# Patient Record
Sex: Female | Born: 1965
Health system: Southern US, Community
[De-identification: ages and names within clinical notes are randomized; demographics above are authoritative.]

## PROBLEM LIST (undated history)

## (undated) ENCOUNTER — Inpatient Hospital Stay (HOSPITAL_COMMUNITY): Admission: EM | Payer: Self-pay | Source: Home / Self Care

## (undated) DIAGNOSIS — I10 Essential (primary) hypertension: Secondary | ICD-10-CM

## (undated) DIAGNOSIS — IMO0002 Reserved for concepts with insufficient information to code with codable children: Secondary | ICD-10-CM

## (undated) DIAGNOSIS — K56609 Unspecified intestinal obstruction, unspecified as to partial versus complete obstruction: Secondary | ICD-10-CM

## (undated) DIAGNOSIS — T7840XA Allergy, unspecified, initial encounter: Secondary | ICD-10-CM

## (undated) DIAGNOSIS — F329 Major depressive disorder, single episode, unspecified: Secondary | ICD-10-CM

## (undated) DIAGNOSIS — K5090 Crohn's disease, unspecified, without complications: Secondary | ICD-10-CM

## (undated) DIAGNOSIS — F32A Depression, unspecified: Secondary | ICD-10-CM

## (undated) DIAGNOSIS — D649 Anemia, unspecified: Secondary | ICD-10-CM

## (undated) DIAGNOSIS — K219 Gastro-esophageal reflux disease without esophagitis: Secondary | ICD-10-CM

## (undated) DIAGNOSIS — M722 Plantar fascial fibromatosis: Secondary | ICD-10-CM

## (undated) DIAGNOSIS — F419 Anxiety disorder, unspecified: Secondary | ICD-10-CM

## (undated) DIAGNOSIS — K589 Irritable bowel syndrome without diarrhea: Secondary | ICD-10-CM

## (undated) DIAGNOSIS — R0602 Shortness of breath: Secondary | ICD-10-CM

## (undated) HISTORY — DX: Anxiety disorder, unspecified: F41.9

## (undated) HISTORY — PX: UPPER GASTROINTESTINAL ENDOSCOPY: SHX188

## (undated) HISTORY — DX: Major depressive disorder, single episode, unspecified: F32.9

## (undated) HISTORY — DX: Plantar fascial fibromatosis: M72.2

## (undated) HISTORY — PX: COLONOSCOPY: SHX174

## (undated) HISTORY — DX: Allergy, unspecified, initial encounter: T78.40XA

## (undated) HISTORY — PX: ESOPHAGOGASTRODUODENOSCOPY: SHX1529

## (undated) HISTORY — DX: Irritable bowel syndrome, unspecified: K58.9

## (undated) HISTORY — DX: Crohn's disease, unspecified, without complications: K50.90

## (undated) HISTORY — DX: Anemia, unspecified: D64.9

## (undated) HISTORY — DX: Depression, unspecified: F32.A

## (undated) HISTORY — DX: Unspecified intestinal obstruction, unspecified as to partial versus complete obstruction: K56.609

## (undated) HISTORY — PX: WISDOM TOOTH EXTRACTION: SHX21

---
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
## 1999-04-23 ENCOUNTER — Other Ambulatory Visit: Admission: RE | Admit: 1999-04-23 | Discharge: 1999-04-23 | Payer: Self-pay | Admitting: Obstetrics and Gynecology

## 1999-08-01 ENCOUNTER — Inpatient Hospital Stay (HOSPITAL_COMMUNITY): Admission: AD | Admit: 1999-08-01 | Discharge: 1999-08-01 | Payer: Self-pay | Admitting: Obstetrics & Gynecology

## 1999-10-11 ENCOUNTER — Inpatient Hospital Stay (HOSPITAL_COMMUNITY): Admission: EM | Admit: 1999-10-11 | Discharge: 1999-10-14 | Payer: Self-pay | Admitting: Obstetrics and Gynecology

## 2000-04-21 ENCOUNTER — Other Ambulatory Visit: Admission: RE | Admit: 2000-04-21 | Discharge: 2000-04-21 | Payer: Self-pay | Admitting: Obstetrics and Gynecology

## 2002-10-08 ENCOUNTER — Encounter: Admission: RE | Admit: 2002-10-08 | Discharge: 2002-10-08 | Payer: Self-pay | Admitting: Internal Medicine

## 2002-10-08 ENCOUNTER — Encounter: Payer: Self-pay | Admitting: Internal Medicine

## 2004-08-09 ENCOUNTER — Encounter: Admission: RE | Admit: 2004-08-09 | Discharge: 2004-08-09 | Payer: Self-pay | Admitting: Internal Medicine

## 2006-12-14 ENCOUNTER — Ambulatory Visit: Payer: Self-pay | Admitting: Internal Medicine

## 2007-02-20 ENCOUNTER — Ambulatory Visit: Payer: Self-pay | Admitting: Internal Medicine

## 2007-02-22 ENCOUNTER — Ambulatory Visit: Payer: Self-pay | Admitting: *Deleted

## 2007-03-19 ENCOUNTER — Inpatient Hospital Stay (HOSPITAL_COMMUNITY): Admission: AD | Admit: 2007-03-19 | Discharge: 2007-03-19 | Payer: Self-pay | Admitting: Family Medicine

## 2007-03-19 LAB — ABO/RH

## 2007-03-28 DIAGNOSIS — I10 Essential (primary) hypertension: Secondary | ICD-10-CM | POA: Insufficient documentation

## 2007-03-28 DIAGNOSIS — K219 Gastro-esophageal reflux disease without esophagitis: Secondary | ICD-10-CM

## 2007-04-21 ENCOUNTER — Inpatient Hospital Stay (HOSPITAL_COMMUNITY): Admission: AD | Admit: 2007-04-21 | Discharge: 2007-04-21 | Payer: Self-pay | Admitting: Gynecology

## 2007-06-18 DIAGNOSIS — IMO0002 Reserved for concepts with insufficient information to code with codable children: Secondary | ICD-10-CM

## 2007-06-18 HISTORY — DX: Reserved for concepts with insufficient information to code with codable children: IMO0002

## 2007-06-26 ENCOUNTER — Inpatient Hospital Stay (HOSPITAL_COMMUNITY): Admission: AD | Admit: 2007-06-26 | Discharge: 2007-07-03 | Payer: Self-pay | Admitting: Obstetrics & Gynecology

## 2007-06-27 ENCOUNTER — Encounter: Payer: Self-pay | Admitting: Obstetrics & Gynecology

## 2007-06-28 ENCOUNTER — Encounter: Payer: Self-pay | Admitting: Obstetrics & Gynecology

## 2007-06-29 ENCOUNTER — Encounter: Payer: Self-pay | Admitting: Obstetrics & Gynecology

## 2007-06-29 ENCOUNTER — Encounter (INDEPENDENT_AMBULATORY_CARE_PROVIDER_SITE_OTHER): Payer: Self-pay | Admitting: Obstetrics & Gynecology

## 2007-09-28 ENCOUNTER — Ambulatory Visit: Payer: Self-pay | Admitting: Internal Medicine

## 2007-10-08 ENCOUNTER — Encounter (INDEPENDENT_AMBULATORY_CARE_PROVIDER_SITE_OTHER): Payer: Self-pay | Admitting: *Deleted

## 2007-10-08 LAB — CONVERTED CEMR LAB
BUN: 8 mg/dL (ref 6–23)
Creatinine, Ser: 0.78 mg/dL (ref 0.40–1.20)
Potassium: 3.6 meq/L (ref 3.5–5.3)

## 2008-07-25 ENCOUNTER — Ambulatory Visit: Payer: Self-pay | Admitting: Internal Medicine

## 2008-08-08 ENCOUNTER — Ambulatory Visit: Payer: Self-pay | Admitting: Internal Medicine

## 2008-08-29 ENCOUNTER — Ambulatory Visit: Payer: Self-pay | Admitting: Internal Medicine

## 2008-09-24 ENCOUNTER — Telehealth (INDEPENDENT_AMBULATORY_CARE_PROVIDER_SITE_OTHER): Payer: Self-pay | Admitting: *Deleted

## 2008-09-30 ENCOUNTER — Telehealth (INDEPENDENT_AMBULATORY_CARE_PROVIDER_SITE_OTHER): Payer: Self-pay | Admitting: Internal Medicine

## 2009-08-24 ENCOUNTER — Telehealth (INDEPENDENT_AMBULATORY_CARE_PROVIDER_SITE_OTHER): Payer: Self-pay | Admitting: Internal Medicine

## 2009-09-08 ENCOUNTER — Encounter (INDEPENDENT_AMBULATORY_CARE_PROVIDER_SITE_OTHER): Payer: Self-pay | Admitting: *Deleted

## 2009-11-04 ENCOUNTER — Telehealth: Payer: Self-pay | Admitting: Physician Assistant

## 2009-11-11 ENCOUNTER — Telehealth (INDEPENDENT_AMBULATORY_CARE_PROVIDER_SITE_OTHER): Payer: Self-pay | Admitting: Internal Medicine

## 2010-03-11 ENCOUNTER — Encounter: Admission: RE | Admit: 2010-03-11 | Discharge: 2010-03-11 | Payer: Self-pay | Admitting: Internal Medicine

## 2010-05-25 ENCOUNTER — Ambulatory Visit: Payer: Self-pay | Admitting: Internal Medicine

## 2010-05-25 DIAGNOSIS — H9319 Tinnitus, unspecified ear: Secondary | ICD-10-CM | POA: Insufficient documentation

## 2010-05-25 DIAGNOSIS — M722 Plantar fascial fibromatosis: Secondary | ICD-10-CM

## 2010-05-25 DIAGNOSIS — N949 Unspecified condition associated with female genital organs and menstrual cycle: Secondary | ICD-10-CM

## 2010-05-25 LAB — CONVERTED CEMR LAB
Beta hcg, urine, semiquantitative: NEGATIVE
Bilirubin Urine: NEGATIVE
Blood in Urine, dipstick: NEGATIVE
Glucose, Urine, Semiquant: NEGATIVE
Specific Gravity, Urine: 1.015
pH: 7

## 2010-05-31 DIAGNOSIS — E876 Hypokalemia: Secondary | ICD-10-CM

## 2010-05-31 LAB — CONVERTED CEMR LAB
BUN: 9 mg/dL (ref 6–23)
CO2: 24 meq/L (ref 19–32)
Calcium: 8.7 mg/dL (ref 8.4–10.5)
Chloride: 106 meq/L (ref 96–112)
Creatinine, Ser: 0.64 mg/dL (ref 0.40–1.20)
Potassium: 3.2 meq/L — ABNORMAL LOW (ref 3.5–5.3)

## 2010-06-08 ENCOUNTER — Encounter (INDEPENDENT_AMBULATORY_CARE_PROVIDER_SITE_OTHER): Payer: Self-pay | Admitting: Internal Medicine

## 2010-06-16 ENCOUNTER — Encounter
Admission: RE | Admit: 2010-06-16 | Discharge: 2010-07-17 | Payer: Self-pay | Source: Home / Self Care | Attending: Internal Medicine | Admitting: Internal Medicine

## 2010-06-23 ENCOUNTER — Encounter (INDEPENDENT_AMBULATORY_CARE_PROVIDER_SITE_OTHER): Payer: Self-pay | Admitting: Internal Medicine

## 2010-07-13 ENCOUNTER — Encounter (INDEPENDENT_AMBULATORY_CARE_PROVIDER_SITE_OTHER): Payer: Self-pay | Admitting: Internal Medicine

## 2010-07-21 ENCOUNTER — Encounter
Admission: RE | Admit: 2010-07-21 | Discharge: 2010-08-08 | Payer: Self-pay | Source: Home / Self Care | Attending: Internal Medicine | Admitting: Internal Medicine

## 2010-07-21 ENCOUNTER — Encounter (INDEPENDENT_AMBULATORY_CARE_PROVIDER_SITE_OTHER): Payer: Self-pay | Admitting: Internal Medicine

## 2010-08-09 ENCOUNTER — Encounter: Payer: Self-pay | Admitting: Obstetrics & Gynecology

## 2010-08-17 NOTE — Assessment & Plan Note (Signed)
Summary: NEED GET HER MEDS//[facility]   Vital Signs:  Patient profile:   45 year old female Menstrual status:  irregular LMP:     04/27/2010 Weight:      180.5 pounds Temp:     97 degrees F oral Pulse rate:   78 / minute Pulse rhythm:   regular Resp:     20 per minute BP sitting:   130 / 84  (left arm) Cuff size:   regular  Vitals Entered By: Bridgett Larsson RN CC: Needs all meds refilled. Has seen Dr. Jeanie Cooks who added HCTZ 12.41m. Has been dx with plantar fascitis takes Naproxen however painis worse, ear problems Is Patient Diabetic? No Pain Assessment Patient in pain? yes     Location: abdomen Intensity: 5 Type: dull Onset of pain  Intermittent  Does patient need assistance? Functional Status Self care Ambulation Normal Comments Concerned she might be pregnant, Abd pains, denies tenderness in breast, has had several episodes of nausea. LMP (date): 04/27/2010     Menstrual Status irregular Enter LMP: 04/27/2010   CC:  Needs all meds refilled. Has seen Dr. AJeanie Cookswho added HCTZ 12.529m Has been dx with plantar fascitis takes Naproxen however painis worse and ear problems.  History of Present Illness: 1.  Hypertension:  has been out of HCTZ, [facility], for 6 days and the Procardia since the 21st of October.  Needs to get started back on both.  [facility]--reportedly was okay.    2.  Would like to have pregnancy test.  A few days late.  Having some discomfort in lower right back.  No real symptoms that period is going to start.  3.  Ear concerns:  few weeks ago had an ear ache.  Feels eardrum almost "pounding"  like the eardrum is going back and forth--like being hit like a drum.  Has this daily.  Notes usually when lying down.  Can have in either ear.  Faster than a heartbeat.  Hearing is unaffected.  No dizziness.  Rarely has ringing in ears.  4.  Diagnosed with left plantar fasciitis and was  told to take Naproxen.  Pain is now going up back of calf.  Did not get exercises.  Is wearing cushioned inserts for shoes.    Allergies (verified): No Known Drug Allergies  Physical Exam  General:  NAD Ears:  External ear exam shows no significant lesions or deformities.  Otoscopic examination reveals clear canals, tympanic membranes are intact bilaterally without bulging, retraction, inflammation or discharge. Hearing is grossly normal bilaterally. Nose:  External nasal examination shows no deformity or inflammation. Nasal mucosa are pink and moist without lesions or exudates. Mouth:  pharynx pink and moist.   Neck:  No deformities, masses, or tenderness noted. Lungs:  Normal respiratory effort, chest expands symmetrically. Lungs are clear to auscultation, no crackles or wheezes. Heart:  Normal rate and regular rhythm. S1 and S2 normal without gallop, murmur, click, rub or other extra sounds.  Radial pulses normal and equal Abdomen:  Bowel sounds positive,abdomen soft and non-tender without masses, organomegaly or hernias noted.  No right flank tenderness. Extremities:  Tender over plantar aspect of left heel.  NT over achilles tendon.  No erythema or swelling.   Impression & Recommendations:  Problem # 1:  TINNITUS (ICD-388.30) Not true tinnitus--sounds more like may be having twitching of tensor tympani muscle--pt describes more of a fluttering muscle rhythm.  Having occasional muscle twitches elsewhere To follow and get good rest, stay hydrated and avoid loud noises.  Problem # 2:  PLANTAR FASCIITIS, LEFT (ICD-728.71) Went over home exercises NSAID use as well. Heel cups bilaterally Orders: Physical Therapy Referral (PT)  Problem # 3:  DELAYED MENSES (ICD-626.8) Suspect not pregnant with history of infertility Orders: Urine Pregnancy Test  (28786)  Problem # 4:  HYPERTENSION (ICD-401.9) Restart meds. Her updated medication list for this problem includes:    Procardia Xl  60 Mg Tb24 (Nifedipine) .Marland Kitchen... 1 tab by mouth daily    Hydrochlorothiazide 12.5 Mg Caps (Hydrochlorothiazide) .Marland Kitchen... 1 tab by mouth daily  Orders: T-Basic Metabolic Panel (76720-94709) UA Dipstick w/o Micro (manual) (81002)  Problem # 5:  Preventive Health Care (ICD-V70.0) Flu vaccine  Problem # 6:  ACID REFLUX DISEASE (ICD-530.81) Pt. at discharge relates she needs PPI refilled as has been out as well and having heartburn. Her updated medication list for this problem includes:    Omeprazole 20 Mg Tbec (Omeprazole) .Marland Kitchen... 1 tab by mouth daily  Complete Medication List: 1)  Procardia Xl 60 Mg Tb24 (Nifedipine) .Marland Kitchen.. 1 tab by mouth daily 2)  Prenatal Vitamins 0.8 Mg Tabs (Prenatal multivit-min-fe-fa) .Marland Kitchen.. 1 tab by mouth daily 3)  Omeprazole 20 Mg Tbec (Omeprazole) .Marland Kitchen.. 1 tab by mouth daily 4)  Hydrochlorothiazide 12.5 Mg Caps (Hydrochlorothiazide) .Marland Kitchen.. 1 tab by mouth daily  Other Orders: Flu Vaccine 67yr + ((62836 Admin 1st Vaccine ((62947  Patient Instructions: 1)  [facility])  Follow up with Dr. MAmil Amenin 4 months --htn, plantar fasciitis Prescriptions: OMEPRAZOLE 20 MG TBEC (OMEPRAZOLE) 1 tab by mouth daily  #30 x 11   Entered and Authorized by:   EMack HookMD   Signed by:   EMack HookMD on 05/25/2010   Method used:   Faxed to ...       [facility])       [address] [place]  [address]      Ph: [phone]      Fax: [phone]  RxID:   [id number] XL 60 MG  TB24 (NIFEDIPINE) 1 tab by mouth daily  #30 x 11   Entered and Authorized by:   EMack HookMD   Signed by:   EMack HookMD on 05/25/2010   Method used:   Faxed to ...       [facility])       [address]  [address]      Ph: [phone]      Fax: [phone]  RxID:    [id number] 12.5 MG CAPS (HYDROCHLOROTHIAZIDE) 1 tab by mouth daily  #30 x 11   Entered and Authorized by:   EMack HookMD   Signed by:   EMack HookMD on 05/25/2010   Method used:   Faxed to ...       [facility]       [address]   [address]      Ph: [phone]      Fax: [phone]  RxID:   [id number]   Orders Added: 1)  T-Basic Metabolic Panel [[[id number]]2)  Urine Pregnancy Test  [81025] 3)  UA Dipstick w/o Micro (manual) [81002] 4)  Est. Patient Level IV [56812] 5)  Flu Vaccine 51yr + [90658] 6)  Admin 1st Vaccine [90471] 7)  Physical Therapy Referral [PT]   Immunizations Administered:  Influenza Vaccine # 1:    Vaccine Type: Fluvax 3+    Site: left deltoid    Mfr: [place]   Dose: 0.5 ml    Route: IM    Given by: MBridgett LarssonRN    Exp. Date: 01/15/2011    Lot #: [id number]   VIS given: 02/09/10 version given May 25, 2010.  Flu Vaccine Consent Questions:    Do you have a history of severe allergic reactions to this vaccine? no    Any prior history of allergic reactions to egg and/or gelatin? no    Do you have a sensitivity to the preservative Thimersol? no    Do you have a past history of Guillan-Barre Syndrome? no    Do you currently have an acute febrile illness? no    Have you ever had a severe reaction to latex? no    Vaccine information given and explained to patient? yes    Are you currently pregnant? yes   Immunizations Administered:  Influenza Vaccine # 1:    Vaccine Type: Fluvax 3+    Site: left deltoid    Mfr: [place]   Dose: 0.5 ml    Route: IM    Given by: MBridgett LarssonRN    Exp. Date: 01/15/2011    Lot #: [id number]   VIS given: 02/09/10 version given May 25, 2010.    Laboratory Results   Urine Tests  Date/Time Received: May 25, 2010 6:02 PM   Routine Urinalysis    Color: lt. yellow Glucose: negative   (Normal Range: Negative) Bilirubin: negative   (Normal Range: Negative) Ketone: negative   (Normal Range: Negative) Spec. Gravity: 1.015   (Normal Range: 1.003-1.035) Blood: negative   (Normal Range: Negative) pH: 7.0   (Normal Range: 5.0-8.0) Protein: negative   (Normal Range: Negative) Urobilinogen: 1.0   (Normal Range: 0-1) Nitrite: negative   (Normal Range: Negative) Leukocyte Esterace: negative   (Normal Range: Negative)    Urine HCG: negative

## 2010-08-17 NOTE — Letter (Signed)
Summary: *HSN Results Follow up  [place], [place] [address]   Phone: [phone]  Fax: [phone]      09/08/2009   CHRISTY EHRSAM [address] [place], [place]  [address]   Dear  Ms. Silverio Decamp,                            ____S.Drinkard,FNP   ____D. Gore,FNP       ____B. McPherson,MD   ____V. Rankins,MD    __xx__E. Mulberry,MD    ____N. Hassell Done, FNP  ____D. Jobe Igo, MD    ____K. Tomma Lightning, MD    ____Other     This letter is to inform you that your recent test(s):  _______Pap Smear    _______Lab Test     _______X-ray __x__Prescription request    _______ is within acceptable limits  _______ requires a medication change  _______ requires a follow-up lab visit  ____xx___ requires a follow-up visit with your provider  Comments:  Please call to schedule an appointment with Dr. Amil Amen.  Thank you.      _________________________________________________________ If you have any questions, please contact our office                     Sincerely,  Shellia Carwin CMA [facility]

## 2010-08-17 NOTE — Progress Notes (Signed)
Summary: DIDN'T GET REFILL ON OMEPRAZOLE  Phone Note Call from Patient Call back at Home Phone [phone]   Reason for Call: Refill Medication Summary of Call: Marissa Blankenship PT. MS Cajuste SAYS THAT SHE CALLED LAST WEEK AND SAYS THAT OTHER THAN HER PROCARDIA BEING REFILLED, SHE ALSO NEEDED HER OMEPRAZOLE CALLED IN AND SHE WAS TOLD THAT IT WOULD BE FAXED, BUT SHE CHECKED ON IT AND THE PHARMACY DOESN'T HAVE IT. Initial call taken by: Roberto Scales,  November 11, 2009 10:22 AM  Follow-up for Phone Call        Will fwd to provider for refill auth.  Pt has medicaid and will be seeing her new doctor on May 15th and would like one refill of omeprezole called to [place] until she can get in with her new doctor. Follow-up by: Shellia Carwin CMA,  November 11, 2009 11:30 AM  Additional Follow-up for Phone Call Additional follow up Details #1::        [facility] Additional Follow-up by: Aurora Mask FNP,  November 12, 2009 7:09 AM    Additional Follow-up for Phone Call Additional follow up Details #2::    Called [phone], Left message on answering machine for pt to return call .......Marland Kitchen Oswaldo Conroy SMA   November 12, 2009 9:43 AM   Prescriptions: OMEPRAZOLE 20 MG TBEC (OMEPRAZOLE) 1 tab by mouth daily  #30 x 1   Entered and Authorized by:   Aurora Mask FNP   Signed by:   Aurora Mask FNP on 11/12/2009   Method used:   [facility] [phone]* (retail)       [address]       [place], [place]  [address]       Ph: [phone] or [phone]       Fax: [phone]   RxID:   [id number]

## 2010-08-17 NOTE — Progress Notes (Signed)
Summary: med refill  Phone Note Call from Patient Call back at Home Phone [phone] Call back at [phone]   Summary of Call: Ms. Toppin had probably few months with a medicaid card uinder another medical office but the new Dr Gabriel Carina has somehting available until May 15.  Pt used to see Dr Amil Amen in the past and she is wondered if the provder can refills (neproxel and lisinopril medications) [facility]   Initial call taken by: Alexis Goodell,  November 04, 2009 10:50 AM  Follow-up for Phone Call        Pt requesting refill of her bp med, we had her on procardia it appears, she will no longer be our pt (due to [place] not being on her medicaid card), but her first appt at her new doctor is not until 5/15 and would like a refill of her med to last until then if possible.  [facility] Follow-up by: Shellia Carwin CMA,  November 04, 2009 11:08 AM  Additional Follow-up for Phone Call Additional follow up Details #1::        Left message on answering machine for pt to return call  Additional Follow-up by: Shellia Carwin CMA,  November 04, 2009 2:39 PM    Additional Follow-up for Phone Call Additional follow up Details #2::    pt aware via Kim Follow-up by: Shellia Carwin CMA,  November 04, 2009 4:54 PM  Prescriptions: PROCARDIA XL 60 MG  TB24 (NIFEDIPINE) 1 tab by mouth daily  #30 x 0   Entered and Authorized by:   Richardson Dopp PA-C   Signed by:   Richardson Dopp PA-C on 11/04/2009   Method used:   [facility]        [facility] [address] [phone]* (retail)       [address]       [place], [place]  [address]       Ph: [phone] or [phone]       Fax: [phone]   RxID:   [id number]

## 2010-08-17 NOTE — Miscellaneous (Signed)
Summary: Rehab Report//INITIAL SUMMARY  Rehab Report//INITIAL SUMMARY   Imported By: Roland Earl 06/23/2010 10:04:44  _____________________________________________________________________  External Attachment:    Type:   Image     Comment:   External Document

## 2010-08-17 NOTE — Letter (Signed)
Summary: PT REQUESTING RECORDS FROM [facility]  PT REQUESTING RECORDS FROM [facility]   Imported By: Roland Earl 06/15/2010 15:37:05  _____________________________________________________________________  External Attachment:    Type:   Image     Comment:   External Document

## 2010-08-17 NOTE — Progress Notes (Signed)
Summary: Needs an office visit  Phone Note Outgoing Call   Summary of Call: notify pt - will fill BP meds for 1 month but looks like her last visit was 07/2008. she needs to schedule an office visit with Dr. Amil Amen Initial call taken by: Aurora Mask FNP,  August 24, 2009 2:14 PM  Follow-up for Phone Call        Left message on answering machine for pt to return call at [phone]. Follow-up by: Shellia Carwin CMA,  August 26, 2009 11:41 AM  Additional Follow-up for Phone Call Additional follow up Details #1::        Left message on answering machine for pt to return call at [phone]. Additional Follow-up by: Shellia Carwin CMA,  September 01, 2009 10:04 AM    Additional Follow-up for Phone Call Additional follow up Details #2::    Left message on answering machine for pt to return call at same number, will also mail letter since 3rd attept to reach pt. Follow-up by: Shellia Carwin CMA,  September 08, 2009 10:37 AM

## 2010-08-19 NOTE — Miscellaneous (Signed)
Summary: Rehab Report//DISCHARGE SUMMARY  Rehab Report//DISCHARGE SUMMARY   Imported By: Roland Earl 08/05/2010 14:10:30  _____________________________________________________________________  External Attachment:    Type:   Image     Comment:   External Document

## 2010-08-19 NOTE — Miscellaneous (Signed)
Summary: Rehab Report/renewal summary  Rehab Report/renewal summary   Imported By: Roland Earl 07/15/2010 10:12:09  _____________________________________________________________________  External Attachment:    Type:   Image     Comment:   External Document

## 2010-09-21 ENCOUNTER — Encounter (INDEPENDENT_AMBULATORY_CARE_PROVIDER_SITE_OTHER): Payer: Self-pay | Admitting: Internal Medicine

## 2010-09-21 ENCOUNTER — Encounter: Payer: Self-pay | Admitting: Internal Medicine

## 2010-09-21 ENCOUNTER — Encounter (INDEPENDENT_AMBULATORY_CARE_PROVIDER_SITE_OTHER): Payer: Self-pay | Admitting: *Deleted

## 2010-09-23 ENCOUNTER — Telehealth (INDEPENDENT_AMBULATORY_CARE_PROVIDER_SITE_OTHER): Payer: Self-pay | Admitting: Internal Medicine

## 2010-09-28 NOTE — Assessment & Plan Note (Signed)
Vital Signs:  Patient profile:   45 year old female Menstrual status:  irregular LMP:     09/09/2010 Weight:      165.0 pounds Temp:     98.4 degrees F oral Pulse rate:   82 / minute Pulse rhythm:   regular Resp:     20 per minute BP sitting:   111 / 73  (left arm) Cuff size:   regular  Vitals Entered By: Isla Pence (September 21, 2010 4:40 PM) CC: follow-up visit...foot pain Is Patient Diabetic? No Pain Assessment Patient in pain? yes     Location: foot Intensity: 7  Does patient need assistance? Functional Status Self care Ambulation Normal LMP (date): 09/09/2010     Enter LMP: 09/09/2010   CC:  follow-up visit...foot pain.  History of Present Illness: 1.  GERD:  Pt. relates she has had weight loss as her reflux is not well controlled.  Can go days without eating secondary to the pain--last 1-2 months.  Discomfort in epigastric area--sharp pain.  No melena or hematochezia.  Has had nausea, but no vomiting.  Has had burning in sternal area.  Has symptoms 3-4 times weekly.  Can last all day.  Eating makes worse.  Still has gallbladder.  No radiation of pain to back or shoulder blade.  If passes gas or has bm, feels better.    2.  Hypertension:  Had low potassium and was started on potassium--but did not continue after one dose as she developed muscle cramps all over.  Never tried again.  Has  been eating bananas regularly instead, though causes heartburn.  Sounds like eating bananas, however only twice monthly.  Stopped prenatal vitamins with reflux about 2 months ago.  3.  Left plantar fasciitis:  Used sister's Voltaren cream and helped.  Still wearing heel cups, doing exercises that obtained from PT--did help, but once done, recurs.    Allergies (verified): No Known Drug Allergies  Physical Exam  Lungs:  Normal respiratory effort, chest expands symmetrically. Lungs are clear to auscultation, no crackles or wheezes. Heart:  Normal rate and regular rhythm. S1 and  S2 normal without gallop, murmur, click, rub or other extra sounds.  Radial pulses normal and equal Abdomen:  soft, non-tender, normal bowel sounds, no masses, no hepatomegaly, and no splenomegaly.   Extremities:  No ankle edema.   Impression & Recommendations:  Problem # 1:  HYPOKALEMIA (ICD-276.8) As bp so well controlled and has had weight loss, will stop HCTZ and see if bp can be maintained at a good level on Procardia only. Recheck potassium when returns--pt. has stopped potassium supplementation   Problem # 2:  PLANTAR FASCIITIS, LEFT (ICD-728.71) Attempted to get topical Voltaren, but as pt. left, we were informed by pharmacy that they could not obtain. To get stocking that plantar flexes foot during sleep, perform exercises before arising from bed in morning and to wear cushioned slippers in home--never go barefoot.  Problem # 3:  ACID REFLUX DISEASE (ICD-530.81) Start two times a day Protonix Follow up quickly with signficant weight loss GERD precautions discussed at length Her updated medication list for this problem includes:    Protonix 40 Mg Tbec (Pantoprazole sodium) .Marland Kitchen... 1 tab by mouth two times a day 1/2 hour before meal on empty stomach  Problem # 4:  HYPERTENSION (ICD-401.9) Stopping HCTZ secondary to hypokalemia and intolerance to potassium supplementation. The following medications were removed from the medication list:    Hydrochlorothiazide 12.5 Mg Caps (Hydrochlorothiazide) .Marland KitchenMarland KitchenMarland KitchenMarland Kitchen  1 tab by mouth daily Her updated medication list for this problem includes:    Procardia Xl 60 Mg Tb24 (Nifedipine) .Marland Kitchen... 1 tab by mouth daily  Complete Medication List: 1)  Procardia Xl 60 Mg Tb24 (Nifedipine) .Marland Kitchen.. 1 tab by mouth daily 2)  Protonix 40 Mg Tbec (Pantoprazole sodium) .Marland Kitchen.. 1 tab by mouth two times a day 1/2 hour before meal on empty stomach 3)  Solaraze 3 % Gel (Diclofenac sodium) .... Apply small amt to affected area of foot two times a day  Patient Instructions: 1)   Follow up with Dr. Amil Amen in 1 month --GERD, htn 2)  No antiinflammatory meds, avoid chocolate, alcohol, caffeine.   3)  Elevate HOB Prescriptions: PROTONIX 40 MG TBEC (PANTOPRAZOLE SODIUM) 1 tab by mouth two times a day 1/2 hour before meal on empty stomach  #60 x 2   Entered and Authorized by:   Mack Hook MD   Signed by:   Mack Hook MD on 09/21/2010   Method used:   Faxed to ...       [facility] (retail)       [place], [place]  [address]       Ph: [phone] [place]       Fax: [phone]   RxID:   [id number] SOLARAZE 3 % GEL (DICLOFENAC SODIUM) Apply small amt to affected area of foot two times a day  #60 g x 2   Entered and Authorized by:   Mack Hook MD   Signed by:   Mack Hook MD on 09/21/2010   Method used:   Faxed to ...       [facility] (retail)       [place], [place]  [address]       Ph: [phone] [place]       Fax: [phone]   RxID:   [id number] OMEPRAZOLE 40 MG CPDR (OMEPRAZOLE) 1 cap by mouth on empty stomach 1/2 hour before breakfast.  #30 x 11   Entered and Authorized by:   Mack Hook MD   Signed by:   Mack Hook MD on 09/21/2010   Method used:   [facility]        [facility] [address] [phone]* (retail)       [address]       [place], [place]  [address]       Ph: [phone] or [phone]       Fax: [phone]   RxID:   [id number]    Orders Added: 1)  Est. Patient Level IV [62947]  Appended Document:  Pt. was taking Omeprazole 20 mg daily at time of OV, but was taking with a meal instead of on an empty stomach--medication was replaced in med list as we were moving her meds to [place] and documentation was lost

## 2010-09-28 NOTE — Letter (Signed)
Summary: Consultant Letter  All     ,     Phone:   Fax:     09/21/2010  Dear Dr. :  I am writing to report my evaluation of Marissa Blankenship, a 45 Years Old Female on whom I consulted 07/21/2010 at . When I saw the patient the Chief Complaint was "Needs all meds refilled. Has seen Dr. Jeanie Cooks who added HCTZ 12.24m. Has been dx with plantar fascitis takes Naproxen however painis worse, ear problems".  At that time the history of the present illness was as follows: 1.  Hypertension:  has been [facility] of [facility], [facility], for 6 days and the Procardia since the 21st of October.  Needs to get started back on both.  [facility]--reportedly was okay.    2.  Would like to have pregnancy test.  A few days late.  Having some discomfort in lower right back.  No real symptoms that period is going to start.  3.  Ear concerns:  few weeks ago had an ear ache.  Feels eardrum almost "pounding"  like the eardrum is going back and forth--like being hit like a drum.  Has this daily.  Notes usually when lying down.  Can have in either ear.  Faster than a heartbeat.  Hearing is unaffected.  No dizziness.  Rarely has ringing in ears.  4.  Diagnosed with left plantar fasciitis and was told to take Naproxen.  Pain is now going up back of calf.  Did not get exercises.  Is wearing cushioned inserts for shoes.  .Fayne MediatePast Medical History at the time of my evaluation was notable for: HYPOKALEMIA (ICD-276.8), TINNITUS (ICD-388.30), PLANTAR FASCIITIS, LEFT (ICD-728.71), DELAYED MENSES (ICD-626.8), PRE-ECLAMPSIA (ICD-642.40), ACID REFLUX DISEASE (ICD-530.81), HYPERTENSION (ICD-401.9).  .  Other history is as follows: Hypertension .  There is no documented family history.  There is no documented social history.    I have attached a copy of my clinic note to the end of this letter which details the review of systems and physical examination for your  review. My assessment at that time was as follows: .Marland Kitchen The plan at that time was as follows: .  Ilean's active problems now include: HYPOKALEMIA (ICD-276.8) TINNITUS (ICD-388.30) PLANTAR FASCIITIS, LEFT (ICD-728.71) DELAYED MENSES (ICD-626.8) PRE-ECLAMPSIA (ICD-642.40) ACID REFLUX DISEASE (ICD-530.81) HYPERTENSION (ICD-401.9)   The medication list includes: PROCARDIA XL 60 MG  TB24 (NIFEDIPINE) 1 tab by mouth daily PRENATAL VITAMINS 0.8 MG TABS (PRENATAL MULTIVIT-MIN-FE-FA) 1 tab by mouth daily OMEPRAZOLE 20 MG TBEC (OMEPRAZOLE) 1 tab by mouth daily HYDROCHLOROTHIAZIDE 12.5 MG CAPS (HYDROCHLOROTHIAZIDE) 1 tab by mouth daily KLOR-CON 10 10 MEQ CR-TABS (POTASSIUM CHLORIDE) 1 tab by mouth daily   Active Orders include the following: T-Basic Metabolic Panel [[[id number]]T-Basic Metabolic Panel [[[id number]]Physical Therapy Referral [PT]  I will send copies of the results of the above investigations as they become available to your office if you wish. The most recent office note and chart summary are attached. Thank you for the opportunity to participate in the care of Marissa Blankenship.  Yours truly,   BIsla Pence

## 2010-10-05 NOTE — Progress Notes (Signed)
  Phone Note Outgoing Call   Summary of Call: Laurence Slate is the note that was typed up on 09/21/10?  Is that from you or was that from a provider and just inadvertently scanned in under your name?  It's in her chart just after my OV note. Initial call taken by: Mack Hook MD,  September 23, 2010 12:13 PM  Follow-up for Phone Call        it was pulled up to print for Assim to check the printer because the printer had stopped printing on that day and it was signed by me and not suppose to be in the pt's chart by mistake. So that letter is not valid and there is no way for me to remove it. Follow-up by: Isla Pence,  September 23, 2010 4:53 PM  Additional Follow-up for Phone Call Additional follow up Details #1::        Thanks--I was very confused. Mack Hook MD  September 27, 2010 9:12 AM

## 2010-11-30 NOTE — Op Note (Signed)
NAME:  Marissa Blankenship, Marissa Blankenship NO.:  [id number]   MEDICAL RECORD NO.:  [id number]          PATIENT TYPE:  INP   LOCATION:                                FACILITY:  [facility]   PHYSICIAN:  Cristopher Estimable. Stann Mainland, M.D.   DATE OF BIRTH:  08/03/1965   DATE OF PROCEDURE:  06/29/2007  DATE OF DISCHARGE:                               OPERATIVE REPORT   PREOPERATIVE DIAGNOSES:  A 24-25 weeks intrauterine pregnancy,  preeclampsia, nonreassuring fetal findings, recommendation by maternal  fetal medicine to proceed with delivery.   POSTOPERATIVE DIAGNOSIS:  A 24-25 weeks intrauterine pregnancy,  preeclampsia, nonreassuring fetal findings, recommendation by maternal  fetal medicine to proceed with delivery plus delivery of 450 grams  female infant, Apgars 1, 2 and 3.   PROCEDURE:  Primary classical cesarean section.   SURGEON:  Cristopher Estimable. Stann Mainland, M.D.   ANESTHESIA:  Spinal.   HISTORY:  This 45 year old patient was admitted on December 12 with  hypertension and proteinuria at 24 weeks of gestation.  The patient is a  gravida 4, para 2.  At the time of admission her preeclampsia labs were  normal.  She was begun on labetalol p.o. 200 mg twice daily and a urine  collection was begun.  She was also given antenatal steroids.  Neonatology consult and maternal fetal medicine consults were obtained.  The umbilical Dopplers were noted to be abnormal at the outset - no end  diastolic flow was noted.  The patient was followed very carefully and  completed her steroids on 2/11.  At that time as well there was no  reverse end diastolic flow but there was continued concern.  Total  protein in her urine was 325 mg in 24 hours.  Treatment for a urinary  tract infection - E. coli - was begun with oral Ceftin.  Evaluation on  2/12 revealed that there was reverse end diastolic flow and although the  biophysical profile was still 6 of 10 it was the feeling of the maternal  fetal medicine consultant, Dr. Caryl Asp, that  we proceed with delivery given  the Doppler findings.  The patient was taken to the operating room after  discussion of the problem with the patient by Dr. Caryl Asp and myself.   Once in the operating room spinal anesthetic was carried out with some  difficulty - it required 2 tries but ultimately was very successful.  At  this time the patient was prepped and draped in the usual fashion with  Foley catheter inserted and intravenous Ancef was given preoperatively.   At this time a Pfannenstiel incision was made with minimal difficulty  and dissected down sharply to and through the fascia in a low transverse  fashion with hemostasis created at each layer.  Subfascial space was  created inferiorly and superiorly, muscles separated in the midline.  Peritoneum identified and entered appropriately with care taken to avoid  the bowel superiorly and the bladder inferiorly.  As expected the uterus  was very small.  The decision was made to proceed with a classical  cesarean section and a midline  incision was made from the fundus down to  the vesicouterine peritoneum.  The cavity was entered and after  amniotomy delivery of a viable female was carried out.  Ultimate weight  was found to be 450 grams and Apgars were assigned at 1, 2 and 3.  The  infant was passed off the waiting team headed up by Dr. Norval Gable after the  cord was clamped and cut.  The infant was then taken to the newborn  intensive care unit.  There were no cord bloods to collect.  Placenta  was sent to pathology and the small uterus was delivered, rendered free  of any remaining products of conception and then closure was carried  out, first with a running locking #1 Vicryl suture in a vertical fashion  and this was oversewn with interrupted #1 Vicryl with good  reapproximation and hemostasis achieved.  Tubes and ovaries were  inspected and noted to be normal.  Abdomen was lavaged of all free blood  and clots.  Uterus replaced into the  abdominal cavity and after all  counts were noted to be correct and no foreign bodies were noted to be  remaining in the abdominal cavity closure of the abdomen was begun in  layers.  The abdominal peritoneum was closed with zero Vicryl in a  running fashion and muscle secured with same.  Subfascial space was then  rendered hemostatic and closure of the fascia was then carried out with  zero Vicryl from angle of midline bilaterally.  Subcutaneous tissues  were rendered hemostatic and staples were then used to close the skin.  Sterile pressure dressing was applied and the patient at this time was  transported to the recovery room in satisfactory condition, having  tolerated the procedure well.  As one would expect the baby was in the  newborn intensive care unit and having somewhat of a difficult time  based on gestational age and weight.  Mother was stable in the recovery  room and will be maintained on magnesium sulfate IV for at least 24  hours and labetalol p.o. as needed and in addition will be treated with  intravenous Ancef for a positive urinary tract infection for at least a  24 hour period of time.   ESTIMATED BLOOD LOSS:  Approximately 800 mL.   COUNTS:  All counts correct x2.      Cristopher Estimable. Stann Mainland, M.D.  Electronically Signed     RMW/MEDQ  D:  06/29/2007  T:  06/29/2007  Job:  [id number]

## 2010-12-03 NOTE — Discharge Summary (Signed)
NAME:  Marissa Blankenship, HARLACHER NO.:  [id number]   MEDICAL RECORD NO.:  [id number]          PATIENT TYPE:  INP   LOCATION:  8938                          FACILITY:  [facility]   PHYSICIAN:  Bobbye Charleston, M.D. DATE OF BIRTH:  [birth year]   DATE OF ADMISSION:  06/26/2007  DATE OF DISCHARGE:  07/03/2007                               DISCHARGE SUMMARY   FINAL DIAGNOSES:  1. Intrauterine pregnancy at 24-[redacted] weeks gestation.  2. Preeclampsia.  3. Nonreassuring fetal assessment.   PROCEDURE:  Primary classical cesarean section.  Surgeon:  Dr. Vania Rea.  Complications:  None.   This is 45 year old, G4, P 2-0-1-2 presents at 24-3/7 weeks' gestation.  Was admitted for elevated blood pressure and proteinuria.  The patient  has a history of two spontaneous vaginal deliveries at term and one  termination. Otherwise, the patient's antepartum course was complicated  by a history of some elevated blood pressure while she was on birth  control pills, and the patient was begun on labetalol.  Otherwise, her  antepartum course up to this point has been complicated by indication of  Down's syndrome on her quad screen.  She did have a normal ultrasound  prenatal and declined amniocentesis.  The patient was admitted and  started on labetalol 200 mg b.i.d.  She was also given antenatal  steroids. Neonatology and maternal fetal medicine consults were  obtained. Preeclamptic labs were normal upon admission.  Both Dopplers  were noted to be abnormal. From onset, there was no end-diastolic flow  noted. The patient was also started on some Ceftin for a urinary tract  infection.  A BPP was performed on January 12 that showed 6/10. At this  point, a discussion was held with Dr. Caryl Asp, the maternal fetal medicine  consultant, and decision was made to proceed with delivery of this time  secondary to that and the findings on with Doppler which showed reverse  end-diastolic flow. The patient was taken to  the operating room by Dr.  Vania Rea, and assisting Dr. Caryl Asp where a primary classical cesarean  section was performed with the delivery of a 450 gram female infant with  Apgars of 1, 2, and 3. The baby was taken to the NICU, and to the  patient was maintained on magnesium sulfate for 24 hours after delivery.   The patient knew, because of the early gestational age, that there could  grim result from early delivery. The baby did pass away.  She was seen  by the social workers and pastoral care. She did have family support.  The patient was able to be weaned off magnesium sulfate.  She was on  Procardia XL 16 mg, also may need labetalol 300 mg twice daily. The  patient was started on some IV Rocephin over the night for some elevated  temperatures.   By postoperative day #4, the patient was felt ready for discharge. She  was sent home on a regular diet, told to decrease her activities, was  given Percocet one to two every 4-6 hours as needed for pain, told she  needed to  continue her iron supplement daily, was sent home on Procardia  XL16 mg. She was to return to our office that Tuesday or Wednesday for a  blood pressure check. Of course, instructions and precautions were  reviewed with the patient.   DISCHARGE LABORATORY DATA:  She had a hemoglobin of 11.4, white blood  cell count of 11, and platelets of 133,000.      Jeannette How, P.A.-C.      Bobbye Charleston, M.D.  Electronically Signed    MB/MEDQ  D:  07/30/2007  T:  07/30/2007  Job:  [id number]

## 2010-12-03 NOTE — H&P (Signed)
[facility]  Patient:    Marissa Blankenship, Marissa Blankenship                         MRN: [id number] Adm. Date:  [id number] Attending:  Glendale Chard Dictator:   Lollie Marrow Duplantis, C.N.M.                         History and Physical  HISTORY OF PRESENT ILLNESS:       Marissa Blankenship is a 45 year old, divorced black female, gravida 3, para 1-0-1-1, at 36-6/7 weeks who presents complaining of leaking clear fluid since about 3:30 this morning.  She denies any painful regular uterine contractions.  She reports positive fetal movement.  She denies any nausea, vomiting, headache, or visual disturbances.  Her pregnancy has been followed at  [place] by the certified midwife service and has been essentially uncomplicated though at risk for history of preterm cervical dilata;tion with her first infant, a history of smoking during her early pregnancy, history of abnormal Pap with cryosurgery, history of being Rh negative, and a history of being rubella nonimmune.  GBS is negative.  OB/GYN HISTORY:                   She is a gravida 3, para 1-0-1-1, who delivered a viable female infant if February 1988 who weighed 5 pounds 13 ounces at 37-1/2 weeks following a 13-hour labor.  She delivered vaginally without complications. Her water broke at that time also prior to labor.  In 1991, she had an elective AB at six weeks gestation without complications.  For other GYN history, she had cryosurgery in the 1980s.  She had chlamydia treated in March 2000.  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  ALLERGIES:                        IBUPROFEN which gives her an upset stomach, and SHELLFISH makes her throat constrict.  PAST MEDICAL HISTORY:             She reports having had the usual childhood diseases.  She reports a history of anemia during pregnancy, history of reflux diagnosed in March 2000, has been on Prevacid.  She also reports  occasional urinary tract infections and smoking prior to this pregnancy.  PAST SURGICAL HISTORY:            Wisdom teeth at age 9, elective AB in 61, nd cryosurgery in the 19802.  FAMILY HISTORY:                   Significant for paternal grandmother with MI,  mother and father with hypertension, paternal grandmother with insulin-dependent diabetes mellitus,  maternal aunt with colon cancer.  GENETIC HISTORY:                  Negative.  SOCIAL HISTORY:                   She is currently divorced but involved with Lillia Abed who is involved and supportive.  He is the father of the baby and is employed full time as she is also.  She is of the [employer].  She denies any illicit drug use or alcohol with this pregnancy and reports smoking only prior o knowing she was pregnant.  PRENATAL LABORATORY DATA:  Blood type is B negative.  Her antibody screen is negative, sickle cell trait is negative, syphilis nonreactive, rubella nonimmune, hepatitis B surface antigen negative, HIV nonreactive.  GC and chlamydia are both negative.   One-hour glucola within normal range, and 36-week beta Strep was negative.  PHYSICAL EXAMINATION:  VITAL SIGNS:                      Stable.  She is afebrile.  HEENT:                            Grossly within normal limits.  HEART:                            Regular rhythm and rate.  CHEST:                            Clear.  BREASTS:                          Soft and nontender.  ABDOMEN:                          Gravid with mild uterine contractions noted every 4 minutes.  Fetal heart rate is reactive and reassuring.  PELVIC:                           Sterile speculum exam noted positive pooling,  positive nitrazine, positive fern.   Her cervix to visual exam was approximately a fingertip and 50% and vertex to [place].  EXTREMITIES:                      Within normal limits.  ASSESSMENT:                       1. Intrauterine  pregnancy at term.                                   2. Spontaneous rupture of membranes with clear                                      fluid at 5 a.m. today.                                   3. Mild uterine contractions.                                   4. Negative group B Streptococcus.                                   5. Rh negative.                                   6. Rubella nonimmune.  PLAN:  Admit to [facility] and Delivery to anticipate a normal spontaneous vaginal delivery and augment labor as necessary, and to notify Dr. Ulyses Southward of patients admission. DD:  10/11/99 TD:  10/11/99 Job: [id number]

## 2010-12-07 ENCOUNTER — Other Ambulatory Visit: Payer: Self-pay | Admitting: Obstetrics & Gynecology

## 2010-12-07 LAB — RUBELLA ANTIBODY, IGM: Rubella: IMMUNE

## 2010-12-07 LAB — ABO/RH: RH Type: POSITIVE

## 2010-12-07 LAB — GC/CHLAMYDIA PROBE AMP, GENITAL: Chlamydia: NEGATIVE

## 2010-12-07 LAB — HEPATITIS B SURFACE ANTIGEN: Hepatitis B Surface Ag: NEGATIVE

## 2011-01-09 ENCOUNTER — Inpatient Hospital Stay (HOSPITAL_COMMUNITY)
Admission: AD | Admit: 2011-01-09 | Discharge: 2011-01-09 | Disposition: A | Discharge status: Home / Self Care | Payer: Medicaid Other | Source: Ambulatory Visit | Attending: Obstetrics & Gynecology | Admitting: Obstetrics & Gynecology

## 2011-01-09 DIAGNOSIS — O210 Mild hyperemesis gravidarum: Secondary | ICD-10-CM | POA: Insufficient documentation

## 2011-01-09 DIAGNOSIS — Z79899 Other long term (current) drug therapy: Secondary | ICD-10-CM | POA: Insufficient documentation

## 2011-01-09 DIAGNOSIS — O99891 Other specified diseases and conditions complicating pregnancy: Secondary | ICD-10-CM | POA: Insufficient documentation

## 2011-01-09 DIAGNOSIS — O10019 Pre-existing essential hypertension complicating pregnancy, unspecified trimester: Secondary | ICD-10-CM | POA: Insufficient documentation

## 2011-01-09 DIAGNOSIS — K219 Gastro-esophageal reflux disease without esophagitis: Secondary | ICD-10-CM | POA: Insufficient documentation

## 2011-01-09 DIAGNOSIS — O24919 Unspecified diabetes mellitus in pregnancy, unspecified trimester: Secondary | ICD-10-CM

## 2011-01-09 DIAGNOSIS — E119 Type 2 diabetes mellitus without complications: Secondary | ICD-10-CM | POA: Insufficient documentation

## 2011-01-09 LAB — URINALYSIS, ROUTINE W REFLEX MICROSCOPIC
Glucose, UA: NEGATIVE mg/dL
Hgb urine dipstick: NEGATIVE
Ketones, ur: 40 mg/dL — AB
Leukocytes, UA: NEGATIVE
pH: 5.5 (ref 5.0–8.0)

## 2011-02-06 ENCOUNTER — Inpatient Hospital Stay (HOSPITAL_COMMUNITY)
Admission: AD | Admit: 2011-02-06 | Discharge: 2011-02-06 | Disposition: A | Discharge status: Home / Self Care | Payer: Medicaid Other | Source: Ambulatory Visit | Attending: Obstetrics & Gynecology | Admitting: Obstetrics & Gynecology

## 2011-02-06 ENCOUNTER — Encounter (HOSPITAL_COMMUNITY): Payer: Self-pay

## 2011-02-06 DIAGNOSIS — R109 Unspecified abdominal pain: Secondary | ICD-10-CM | POA: Insufficient documentation

## 2011-02-06 DIAGNOSIS — O99891 Other specified diseases and conditions complicating pregnancy: Secondary | ICD-10-CM | POA: Insufficient documentation

## 2011-02-06 DIAGNOSIS — O26899 Other specified pregnancy related conditions, unspecified trimester: Secondary | ICD-10-CM

## 2011-02-06 DIAGNOSIS — O09529 Supervision of elderly multigravida, unspecified trimester: Secondary | ICD-10-CM | POA: Insufficient documentation

## 2011-02-06 DIAGNOSIS — K5900 Constipation, unspecified: Secondary | ICD-10-CM

## 2011-02-06 HISTORY — DX: Essential (primary) hypertension: I10

## 2011-02-06 HISTORY — DX: Gastro-esophageal reflux disease without esophagitis: K21.9

## 2011-02-06 LAB — DIFFERENTIAL
Eosinophils Relative: 0 % (ref 0–5)
Lymphocytes Relative: 9 % — ABNORMAL LOW (ref 12–46)
Monocytes Relative: 5 % (ref 3–12)
Neutrophils Relative %: 86 % — ABNORMAL HIGH (ref 43–77)

## 2011-02-06 LAB — URINALYSIS, ROUTINE W REFLEX MICROSCOPIC
Glucose, UA: NEGATIVE mg/dL
Hgb urine dipstick: NEGATIVE
Ketones, ur: >80 mg/dL — AB
Leukocytes, UA: NEGATIVE
Nitrite: NEGATIVE
Specific Gravity, Urine: >1.03 — ABNORMAL HIGH (ref 1.005–1.030)
Urobilinogen, UA: 0.2 mg/dL (ref 0.0–1.0)

## 2011-02-06 LAB — AMYLASE: Amylase: 51 U/L (ref 0–105)

## 2011-02-06 LAB — COMPREHENSIVE METABOLIC PANEL
Albumin: 3.1 g/dL — ABNORMAL LOW (ref 3.5–5.2)
Alkaline Phosphatase: 53 U/L (ref 39–117)
BUN: 5 mg/dL — ABNORMAL LOW (ref 6–23)
CO2: 26 mEq/L (ref 19–32)
Chloride: 99 mEq/L (ref 96–112)

## 2011-02-06 LAB — CBC
Hemoglobin: 12.1 g/dL (ref 12.0–15.0)
MCHC: 33.3 g/dL (ref 30.0–36.0)
MCV: 83.3 fL (ref 78.0–100.0)

## 2011-02-06 MED ORDER — SIMETHICONE 80 MG PO CHEW
80.0000 mg | CHEWABLE_TABLET | Freq: Once | ORAL | Status: AC
  Administered 2011-02-06: 80 mg via ORAL
  Filled 2011-02-06: qty 1

## 2011-02-06 MED ORDER — M.V.I. ADULT IV INJ
10.0000 mL | INJECTION | Freq: Once | INTRAVENOUS | Status: AC
  Administered 2011-02-06: 10 mL via INTRAVENOUS
  Filled 2011-02-06: qty 10

## 2011-02-06 NOTE — Progress Notes (Signed)
Pt notified to call Dr. Judge Stall office tomorrow if symptoms persist. Pt voices understanding.

## 2011-02-06 NOTE — Progress Notes (Signed)
Upper abdominal pain having problems with constipation, BM today small, last sufficient bowl movement 1 week ago

## 2011-02-06 NOTE — Progress Notes (Signed)
Pt is G5P2 at 15w 3d ,Pt presents to MAU with complaints of nausea/vomiting, abdominal pain from constipation. Pt states she has not had a "normal stool" in one week. Pt states she has not had anything to eat since yesterday.

## 2011-02-06 NOTE — ED Provider Notes (Signed)
History     Chief Complaint  Patient presents with  . Abdominal Pain  . Nausea   HPI  45 year old, 15 week 3 day gestation, having upper abdominal pain with vomiting for 2 days.  Thinks she is constipated.  Has had a small, hard BM each morning for the past 2 days, but thinks she is not completely emptying.  Has used Zofran for vomiting for 2 days.  Last used Zofran in June.    OB History    Grav Para Term Preterm Abortions TAB SAB Ect Mult Living   5 3  1 1 1    2       Past Medical History  Diagnosis Date  . Hypertension   . Acid reflux     Past Surgical History  Procedure Date  . Cesarean section     No family history on file.  History  Substance Use Topics  . Smoking status: Never Smoker   . Smokeless tobacco: Not on file  . Alcohol Use: No    Allergies: No Known Allergies  Prescriptions prior to admission  Medication Sig Dispense Refill  . docusate sodium (COLACE) 100 MG capsule Take 100 mg by mouth 3 (three) times daily as needed. For constipation       . IRON PO Take 100 mg by mouth daily.        Marland Kitchen NIFEdipine (PROCARDIA XL/ADALAT-CC) 60 MG 24 hr tablet Take 60 mg by mouth daily.        . pantoprazole (PROTONIX) 40 MG tablet Take 40 mg by mouth 2 (two) times daily.        . prenatal vitamin w/FE, FA (PRENATAL 1 + 1) 27-1 MG TABS Take 1 tablet by mouth daily.        Marland Kitchen senna (SENOKOT) 8.6 MG tablet Take 2 tablets by mouth daily.          ROS Physical Exam   Blood pressure 131/83, pulse 73, temperature 98.9 F (37.2 C), temperature source Oral, resp. rate 16, height 5' 4"  (1.626 m), weight 159 lb 12.8 oz (72.485 kg).  Physical Exam  Nursing note and vitals reviewed. Constitutional: She is oriented to person, place, and time. She appears well-developed and well-nourished.       Client is uncomfortable.  HENT:  Head: Normocephalic.  Eyes: EOM are normal.  Neck: Neck supple.  GI: Soft. There is tenderness. There is no rebound and no guarding.       Pain  in upper abdomen above umbilicus  Genitourinary:       No vaginal discharge. No vaginal bleeding. No dysuria.   Musculoskeletal: Normal range of motion.  Neurological: She is alert and oriented to person, place, and time.  Skin: Skin is warm and dry.  Psychiatric: She has a normal mood and affect.    MAU Course  Procedures Results for orders placed during the hospital encounter of 02/06/11 (from the past 24 hour(s))  URINALYSIS, ROUTINE W REFLEX MICROSCOPIC     Status: Abnormal   Collection Time   02/06/11  4:10 PM      Component Value Range   Color, Urine YELLOW  YELLOW    Appearance CLEAR  CLEAR    Specific Gravity, Urine >1.030 (*) 1.005 - 1.030    pH 6.0  5.0 - 8.0    Glucose, UA NEGATIVE  NEGATIVE (mg/dL)   Hgb urine dipstick NEGATIVE  NEGATIVE    Bilirubin Urine SMALL (*) NEGATIVE    Ketones, ur >80 (*)  NEGATIVE (mg/dL)   Protein, ur NEGATIVE  NEGATIVE (mg/dL)   Urobilinogen, UA 0.2  0.0 - 1.0 (mg/dL)   Nitrite NEGATIVE  NEGATIVE    Leukocytes, UA NEGATIVE  NEGATIVE   CBC     Status: Abnormal   Collection Time   02/06/11  4:20 PM      Component Value Range   WBC 8.7  4.0 - 10.5 (K/uL)   RBC 4.36  3.87 - 5.11 (MIL/uL)   Hemoglobin 12.1  12.0 - 15.0 (g/dL)   HCT 36.3  36.0 - 46.0 (%)   MCV 83.3  78.0 - 100.0 (fL)   MCH 27.8  26.0 - 34.0 (pg)   MCHC 33.3  30.0 - 36.0 (g/dL)   RDW 16.6 (*) 11.5 - 15.5 (%)   Platelets 280  150 - 400 (K/uL)  DIFFERENTIAL     Status: Abnormal   Collection Time   02/06/11  4:20 PM      Component Value Range   Neutrophils Relative 86 (*) 43 - 77 (%)   Neutro Abs 7.5  1.7 - 7.7 (K/uL)   Lymphocytes Relative 9 (*) 12 - 46 (%)   Lymphs Abs 0.8  0.7 - 4.0 (K/uL)   Monocytes Relative 5  3 - 12 (%)   Monocytes Absolute 0.4  0.1 - 1.0 (K/uL)   Eosinophils Relative 0  0 - 5 (%)   Eosinophils Absolute 0.0  0.0 - 0.7 (K/uL)   Basophils Relative 0  0 - 1 (%)   Basophils Absolute 0.0  0.0 - 0.1 (K/uL)  COMPREHENSIVE METABOLIC PANEL     Status:  Abnormal   Collection Time   02/06/11  4:20 PM      Component Value Range   Sodium 132 (*) 135 - 145 (mEq/L)   Potassium 3.7  3.5 - 5.1 (mEq/L)   Chloride 99  96 - 112 (mEq/L)   CO2 26  19 - 32 (mEq/L)   Glucose, Bld 85  70 - 99 (mg/dL)   BUN 5 (*) 6 - 23 (mg/dL)   Creatinine, Ser <0.47 (*) 0.50 - 1.10 (mg/dL)   Calcium 9.3  8.4 - 10.5 (mg/dL)   Total Protein 8.0  6.0 - 8.3 (g/dL)   Albumin 3.1 (*) 3.5 - 5.2 (g/dL)   AST 12  0 - 37 (U/L)   ALT 8  0 - 35 (U/L)   Alkaline Phosphatase 53  39 - 117 (U/L)   Total Bilirubin 0.2 (*) 0.3 - 1.2 (mg/dL)   GFR calc non Af Amer NOT CALCULATED  >60 (mL/min)   GFR calc Af Amer NOT CALCULATED  >60 (mL/min)  AMYLASE     Status: Normal   Collection Time   02/06/11  4:20 PM      Component Value Range   Amylase 51  0 - 105 (U/L)    MDM Consult with Dr. Stann Mainland re: plan of care.  Assessment and Plan  Upper abdominal pain in pregnancy - possibly due to constipation. Vomiting  Plan:  Mylicon chewable tablet PO IV D5LR with one amp multivitamins 500 cc bolus and 500cc/hr as urine is concentrated with 80 of ketones. IV fluids infused and client is feeling much better. Has Zofran at home. Advised to get Miralax and take by the package directions. Keep the scheduled appointment you have in the office.  Call your doctor if you have questions or concerns.    BURLESON,TERRI 02/06/2011, 3:53 PM

## 2011-03-24 ENCOUNTER — Inpatient Hospital Stay (HOSPITAL_COMMUNITY)
Admission: AD | Admit: 2011-03-24 | Discharge: 2011-03-24 | Disposition: A | Discharge status: Home / Self Care | Payer: Medicaid Other | Source: Ambulatory Visit | Attending: Obstetrics & Gynecology | Admitting: Obstetrics & Gynecology

## 2011-03-24 ENCOUNTER — Encounter (HOSPITAL_COMMUNITY): Payer: Self-pay

## 2011-03-24 DIAGNOSIS — O99891 Other specified diseases and conditions complicating pregnancy: Secondary | ICD-10-CM | POA: Insufficient documentation

## 2011-03-24 DIAGNOSIS — K219 Gastro-esophageal reflux disease without esophagitis: Secondary | ICD-10-CM | POA: Insufficient documentation

## 2011-03-24 DIAGNOSIS — R0602 Shortness of breath: Secondary | ICD-10-CM | POA: Insufficient documentation

## 2011-03-24 DIAGNOSIS — O169 Unspecified maternal hypertension, unspecified trimester: Secondary | ICD-10-CM | POA: Insufficient documentation

## 2011-03-24 LAB — URINALYSIS, ROUTINE W REFLEX MICROSCOPIC
Glucose, UA: NEGATIVE mg/dL
Ketones, ur: NEGATIVE mg/dL
Leukocytes, UA: NEGATIVE
Nitrite: NEGATIVE
pH: 6 (ref 5.0–8.0)

## 2011-03-24 LAB — COMPREHENSIVE METABOLIC PANEL
ALT: 9 U/L (ref 0–35)
AST: 12 U/L (ref 0–37)
Albumin: 2.5 g/dL — ABNORMAL LOW (ref 3.5–5.2)
Calcium: 8.8 mg/dL (ref 8.4–10.5)
Creatinine, Ser: <0.47 mg/dL — ABNORMAL LOW (ref 0.50–1.10)
Sodium: 135 mEq/L (ref 135–145)
Total Protein: 6.4 g/dL (ref 6.0–8.3)

## 2011-03-24 LAB — DIFFERENTIAL
Basophils Absolute: 0 10*3/uL (ref 0.0–0.1)
Basophils Relative: 0 % (ref 0–1)
Eosinophils Absolute: 0.1 10*3/uL (ref 0.0–0.7)
Eosinophils Relative: 2 % (ref 0–5)
Lymphocytes Relative: 17 % (ref 12–46)
Monocytes Absolute: 0.6 10*3/uL (ref 0.1–1.0)

## 2011-03-24 LAB — CBC
MCH: 27.9 pg (ref 26.0–34.0)
MCHC: 32.3 g/dL (ref 30.0–36.0)
MCV: 86.6 fL (ref 78.0–100.0)
Platelets: 253 10*3/uL (ref 150–400)
RDW: 15.2 % (ref 11.5–15.5)
WBC: 7.8 10*3/uL (ref 4.0–10.5)

## 2011-03-24 LAB — LACTATE DEHYDROGENASE: LDH: 166 U/L (ref 94–250)

## 2011-03-24 NOTE — ED Provider Notes (Signed)
History   Pt presents today for preeclamptic workup. She has a hx of severe preeclampsia with a previous pregnancy that required C/section at around 22wks. She states for the past couple of months she feels short of breath especially when sitting. She denies CP, fever, abd pain, vag dc, or bleeding. She denies HA, RUQ pain, or visual disturbances.  Chief Complaint  Patient presents with  . Shortness of Breath   HPI  OB History    Grav Para Term Preterm Abortions TAB SAB Ect Mult Living   5 3  1 1 1    2       Past Medical History  Diagnosis Date  . Hypertension   . Acid reflux     Past Surgical History  Procedure Date  . Cesarean section     No family history on file.  History  Substance Use Topics  . Smoking status: Never Smoker   . Smokeless tobacco: Not on file  . Alcohol Use: No    Allergies: No Known Allergies  Prescriptions prior to admission  Medication Sig Dispense Refill  . NIFEdipine (PROCARDIA XL/ADALAT-CC) 60 MG 24 hr tablet Take 60 mg by mouth daily.       . pantoprazole (PROTONIX) 40 MG tablet Take 40 mg by mouth 2 (two) times daily.       . prenatal vitamin w/FE, FA (PRENATAL 1 + 1) 27-1 MG TABS Take 1 tablet by mouth daily.       Marland Kitchen docusate sodium (COLACE) 100 MG capsule Take 100 mg by mouth 3 (three) times daily as needed. For constipation       . IRON PO Take 100 mg by mouth daily.         Review of Systems  Constitutional: Negative for fever.  Eyes: Negative for blurred vision and double vision.  Respiratory: Positive for shortness of breath. Negative for cough, hemoptysis, sputum production and wheezing.   Cardiovascular: Negative for chest pain.  Gastrointestinal: Negative for nausea, vomiting, abdominal pain, diarrhea and constipation.  Genitourinary: Negative for dysuria, urgency, frequency and hematuria.  Neurological: Negative for dizziness and headaches.  Psychiatric/Behavioral: Negative for depression and suicidal ideas.   Physical  Exam   Blood pressure 120/75, pulse 76, temperature 98.7 F (37.1 C), temperature source Oral, resp. rate 16, height 5' 5"  (1.651 m), weight 169 lb 9.6 oz (76.93 kg), SpO2 100.00%.  Physical Exam  Constitutional: She is oriented to person, place, and time. She appears well-developed and well-nourished. No distress.  HENT:  Head: Normocephalic and atraumatic.  Eyes: EOM are normal. Pupils are equal, round, and reactive to light.  Cardiovascular: Normal rate, regular rhythm and normal heart sounds.  Exam reveals no gallop and no friction rub.   No murmur heard. Respiratory: Effort normal and breath sounds normal. No respiratory distress. She has no wheezes. She has no rales. She exhibits no tenderness.  GI: Soft. She exhibits no distension. There is no tenderness. There is no rebound and no guarding.  Neurological: She is alert and oriented to person, place, and time.  Skin: Skin is warm and dry. She is not diaphoretic.  Psychiatric: She has a normal mood and affect. Her behavior is normal. Judgment and thought content normal.    MAU Course  Procedures  Results for orders placed during the hospital encounter of 03/24/11 (from the past 24 hour(s))  CBC     Status: Abnormal   Collection Time   03/24/11  1:02 PM      Component  Value Range   WBC 7.8  4.0 - 10.5 (K/uL)   RBC 3.65 (*) 3.87 - 5.11 (MIL/uL)   Hemoglobin 10.2 (*) 12.0 - 15.0 (g/dL)   HCT 31.6 (*) 36.0 - 46.0 (%)   MCV 86.6  78.0 - 100.0 (fL)   MCH 27.9  26.0 - 34.0 (pg)   MCHC 32.3  30.0 - 36.0 (g/dL)   RDW 15.2  11.5 - 15.5 (%)   Platelets 253  150 - 400 (K/uL)  DIFFERENTIAL     Status: Normal   Collection Time   03/24/11  1:02 PM      Component Value Range   Neutrophils Relative 73  43 - 77 (%)   Neutro Abs 5.7  1.7 - 7.7 (K/uL)   Lymphocytes Relative 17  12 - 46 (%)   Lymphs Abs 1.3  0.7 - 4.0 (K/uL)   Monocytes Relative 8  3 - 12 (%)   Monocytes Absolute 0.6  0.1 - 1.0 (K/uL)   Eosinophils Relative 2  0 - 5 (%)    Eosinophils Absolute 0.1  0.0 - 0.7 (K/uL)   Basophils Relative 0  0 - 1 (%)   Basophils Absolute 0.0  0.0 - 0.1 (K/uL)  COMPREHENSIVE METABOLIC PANEL     Status: Abnormal   Collection Time   03/24/11  1:02 PM      Component Value Range   Sodium 135  135 - 145 (mEq/L)   Potassium 3.6  3.5 - 5.1 (mEq/L)   Chloride 103  96 - 112 (mEq/L)   CO2 25  19 - 32 (mEq/L)   Glucose, Bld 102 (*) 70 - 99 (mg/dL)   BUN 6  6 - 23 (mg/dL)   Creatinine, Ser <0.47 (*) 0.50 - 1.10 (mg/dL)   Calcium 8.8  8.4 - 10.5 (mg/dL)   Total Protein 6.4  6.0 - 8.3 (g/dL)   Albumin 2.5 (*) 3.5 - 5.2 (g/dL)   AST 12  0 - 37 (U/L)   ALT 9  0 - 35 (U/L)   Alkaline Phosphatase 55  39 - 117 (U/L)   Total Bilirubin 0.2 (*) 0.3 - 1.2 (mg/dL)   GFR calc non Af Amer NOT CALCULATED  >60 (mL/min)   GFR calc Af Amer NOT CALCULATED  >60 (mL/min)  URIC ACID     Status: Normal   Collection Time   03/24/11  1:02 PM      Component Value Range   Uric Acid, Serum 2.6  2.4 - 7.0 (mg/dL)  LACTATE DEHYDROGENASE     Status: Normal   Collection Time   03/24/11  1:02 PM      Component Value Range   LD 166  94 - 250 (U/L)  URINALYSIS, ROUTINE W REFLEX MICROSCOPIC     Status: Abnormal   Collection Time   03/24/11  1:30 PM      Component Value Range   Color, Urine YELLOW  YELLOW    Appearance HAZY (*) CLEAR    Specific Gravity, Urine 1.020  1.005 - 1.030    pH 6.0  5.0 - 8.0    Glucose, UA NEGATIVE  NEGATIVE (mg/dL)   Hgb urine dipstick NEGATIVE  NEGATIVE    Bilirubin Urine NEGATIVE  NEGATIVE    Ketones, ur NEGATIVE  NEGATIVE (mg/dL)   Protein, ur NEGATIVE  NEGATIVE (mg/dL)   Urobilinogen, UA 0.2  0.0 - 1.0 (mg/dL)   Nitrite NEGATIVE  NEGATIVE    Leukocytes, UA NEGATIVE  NEGATIVE    Discussed  pt with Dr. Stann Mainland at length. He advised that she be dc'd and f/u in the office at her regularly scheduled appt.  Assessment and Plan  Poor OB hx with SOB: discussed with pt at length. Discussed signs and sx of preeclampsia at length. Discussed  diet, activity, risks, and precautions.  Alinda Deem. Corrie Brannen III, DrHSc, MPAS, PA-C  03/24/2011, 2:11 PM   Eliott Nine, PA 03/24/11 1418

## 2011-03-24 NOTE — Progress Notes (Signed)
Pt states that over the past 2-3 months she has had increasing periods of feeling she gets "winded" with very little activity. States she also has periods when she feels her heartbeat will get irregular. Pt 's pulse is very regular at this time and 02 sat 100%.

## 2011-04-22 LAB — DIFFERENTIAL
Basophils Absolute: 0
Basophils Absolute: 0.1
Basophils Relative: 0
Basophils Relative: 1
Eosinophils Absolute: 0.2
Eosinophils Absolute: 0.3
Monocytes Absolute: 0.7
Monocytes Relative: 6
Monocytes Relative: 8
Neutrophils Relative %: 81 — ABNORMAL HIGH

## 2011-04-22 LAB — CBC
HCT: 30 — ABNORMAL LOW
Hemoglobin: 10.2 — ABNORMAL LOW
MCHC: 33.7
MCV: 89
Platelets: 129 — ABNORMAL LOW
Platelets: 133 — ABNORMAL LOW
RBC: 3.81 — ABNORMAL LOW
RDW: 14.5
RDW: 14.6
WBC: 9.5

## 2011-04-22 LAB — LUPUS ANTICOAGULANT PANEL
DRVVT: 39.3 (ref 36.1–47.0)
Lupus Anticoagulant: NOT DETECTED

## 2011-04-22 LAB — COMPREHENSIVE METABOLIC PANEL
ALT: 18
Albumin: 2.2 — ABNORMAL LOW
Alkaline Phosphatase: 62
BUN: 6
Chloride: 108
Glucose, Bld: 71
Potassium: 4
Sodium: 138
Total Bilirubin: 0.4
Total Protein: 5.2 — ABNORMAL LOW

## 2011-04-25 LAB — COMPREHENSIVE METABOLIC PANEL
ALT: 12
ALT: 12
ALT: 12
ALT: 16
AST: 22
AST: 24
AST: 29
Albumin: 1.8 — ABNORMAL LOW
Albumin: 2.6 — ABNORMAL LOW
Alkaline Phosphatase: 63
Alkaline Phosphatase: 69
Alkaline Phosphatase: 98
BUN: 10
BUN: 13
CO2: 21
CO2: 23
CO2: 24
Calcium: 6 — CL
Calcium: 6.2 — CL
Calcium: 7 — ABNORMAL LOW
Calcium: 8.4
Creatinine, Ser: 0.69
GFR calc Af Amer: >60
GFR calc Af Amer: >60
GFR calc non Af Amer: >60
GFR calc non Af Amer: >60
GFR calc non Af Amer: >60
Glucose, Bld: 126 — ABNORMAL HIGH
Glucose, Bld: 82
Glucose, Bld: 90
Potassium: 3.7
Potassium: 3.8
Potassium: 4
Potassium: 4.2
Sodium: 131 — ABNORMAL LOW
Sodium: 133 — ABNORMAL LOW
Sodium: 134 — ABNORMAL LOW
Sodium: 135
Sodium: 136
Total Protein: 4.4 — ABNORMAL LOW
Total Protein: 4.7 — ABNORMAL LOW
Total Protein: 5.7 — ABNORMAL LOW
Total Protein: 5.8 — ABNORMAL LOW
Total Protein: 5.8 — ABNORMAL LOW

## 2011-04-25 LAB — LACTATE DEHYDROGENASE
LDH: 257 — ABNORMAL HIGH
LDH: 372 — ABNORMAL HIGH

## 2011-04-25 LAB — CBC
HCT: 27.7 — ABNORMAL LOW
HCT: 33.4 — ABNORMAL LOW
Hemoglobin: 10 — ABNORMAL LOW
Hemoglobin: 11.3 — ABNORMAL LOW
MCHC: 33.7
MCHC: 33.9
MCHC: 33.9
MCHC: 33.9
MCV: 88
MCV: 89.8
Platelets: 131 — ABNORMAL LOW
Platelets: 174
Platelets: 179
RBC: 3.32 — ABNORMAL LOW
RBC: 3.79 — ABNORMAL LOW
RBC: 3.93
RDW: 14.1
RDW: 14.6
WBC: 11.1 — ABNORMAL HIGH
WBC: 9.2
WBC: 9.9

## 2011-04-25 LAB — MAGNESIUM: Magnesium: 8.4

## 2011-04-25 LAB — DIFFERENTIAL
Basophils Relative: 0
Lymphocytes Relative: 18
Lymphs Abs: 1.5
Monocytes Absolute: 0.8
Monocytes Relative: 10
Neutro Abs: 5.8
Neutrophils Relative %: 69

## 2011-04-25 LAB — PROTEIN, URINE, 24 HOUR
Protein, 24H Urine: 325 — ABNORMAL HIGH
Protein, Urine: 25

## 2011-04-25 LAB — STREP B DNA PROBE: Strep Group B Ag: NEGATIVE

## 2011-04-25 LAB — URINALYSIS, ROUTINE W REFLEX MICROSCOPIC
Glucose, UA: NEGATIVE
Specific Gravity, Urine: 1.015

## 2011-04-25 LAB — RPR: RPR Ser Ql: NONREACTIVE

## 2011-04-25 LAB — SAMPLE TO BLOOD BANK

## 2011-04-25 LAB — RH IMMUNE GLOB WKUP(>/=20WKS)(NOT WOMEN'S HOSP): Fetal Screen: NEGATIVE

## 2011-04-25 LAB — CREATININE CLEARANCE, URINE, 24 HOUR
Collection Interval-CRCL: 24
Urine Total Volume-CRCL: 1300

## 2011-04-25 LAB — URINE MICROSCOPIC-ADD ON

## 2011-04-25 LAB — URIC ACID: Uric Acid, Serum: 4.4

## 2011-04-28 LAB — URINALYSIS, ROUTINE W REFLEX MICROSCOPIC
Bilirubin Urine: NEGATIVE
Ketones, ur: >80 — AB
Nitrite: NEGATIVE
Protein, ur: NEGATIVE
Urobilinogen, UA: 0.2

## 2011-04-28 LAB — COMPREHENSIVE METABOLIC PANEL
ALT: 11
Albumin: 2.8 — ABNORMAL LOW
Alkaline Phosphatase: 45
BUN: 2 — ABNORMAL LOW
Calcium: 8.9
Glucose, Bld: 85
Potassium: 3.7
Sodium: 131 — ABNORMAL LOW
Total Protein: 6.5

## 2011-04-28 LAB — CBC
MCHC: 34
Platelets: 279
RDW: 15 — ABNORMAL HIGH

## 2011-04-29 LAB — POCT PREGNANCY, URINE: Preg Test, Ur: POSITIVE

## 2011-04-29 LAB — URINALYSIS, ROUTINE W REFLEX MICROSCOPIC
Hgb urine dipstick: NEGATIVE
Ketones, ur: 15 — AB
Protein, ur: NEGATIVE
Urobilinogen, UA: 0.2

## 2011-04-29 LAB — GC/CHLAMYDIA PROBE AMP, GENITAL
Chlamydia, DNA Probe: NEGATIVE
GC Probe Amp, Genital: NEGATIVE

## 2011-04-29 LAB — HCG, QUANTITATIVE, PREGNANCY: hCG, Beta Chain, Quant, S: 119393 — ABNORMAL HIGH

## 2011-04-29 LAB — WET PREP, GENITAL: Clue Cells Wet Prep HPF POC: NONE SEEN

## 2011-05-26 ENCOUNTER — Encounter: Payer: Self-pay | Admitting: Cardiology

## 2011-05-26 ENCOUNTER — Ambulatory Visit (INDEPENDENT_AMBULATORY_CARE_PROVIDER_SITE_OTHER): Payer: Medicaid Other | Admitting: Cardiology

## 2011-05-26 DIAGNOSIS — R0600 Dyspnea, unspecified: Secondary | ICD-10-CM | POA: Insufficient documentation

## 2011-05-26 DIAGNOSIS — I10 Essential (primary) hypertension: Secondary | ICD-10-CM

## 2011-05-26 DIAGNOSIS — R0609 Other forms of dyspnea: Secondary | ICD-10-CM

## 2011-05-26 DIAGNOSIS — R000 Tachycardia, unspecified: Secondary | ICD-10-CM | POA: Insufficient documentation

## 2011-05-26 NOTE — Assessment & Plan Note (Signed)
I will check an echocardiogram and a BNP level. If this is normal no further cardiac workup will be suggested unless she has increasing symptoms not suspected to be related to the pregnancy.

## 2011-05-26 NOTE — Patient Instructions (Signed)
Your physician has requested that you have an echocardiogram. Echocardiography is a painless test that uses sound waves to create images of your heart. It provides your doctor with information about the size and shape of your heart and how well your heart's chambers and valves are working. This procedure takes approximately one hour. There are no restrictions for this procedure.  PLEASE HAVE LAB WORK THE SAME DAY AS YOUR ECHO.  CONTINUE ANY MEDICATIONS YOU ARE PRESENTLY TAKING.  FOLLOW UP BE BASED ON THE RESULTS OF YOUR TESTING.

## 2011-05-26 NOTE — Assessment & Plan Note (Signed)
Currently her heart rate is well controlled. However, I will apply a 24-hour Holter monitor. I have also reviewed ER records from September. I don't see a recent TSH and I will draw one of these. If there is no outstanding arrhythmia and normal labs I do not think I will pursue further workup.

## 2011-05-26 NOTE — Progress Notes (Signed)
HPI The patient is [redacted] weeks pregnant. She presents for evaluation of dyspnea and palpitations. She apparently has a history of hypertension particularly during pregnancy but also long-standing. She has been on the medications listed below for quite a while. She's never otherwise had any cardiovascular problems. She's never had any cardiac testing. She's had 5 pregnancies with 3 deliveries. She doesn't report any problems with her term births or deliveries. However, with this pregnancy she's been more dyspneic with activities such as talking. She might get more short of breath occasionally at night but this is not consistent and she's not describing PND or orthopnea. She does have heartburn at night. She might get in more dyspneic with some activities but she's not describing new chest pressure, neck or arm discomfort. She does have an increased heart rate particularly at night. She has had no presyncope or syncope. She thinks her blood pressure has been controlled.  No Known Allergies  Current Outpatient Prescriptions  Medication Sig Dispense Refill  . NIFEdipine (PROCARDIA XL/ADALAT-CC) 60 MG 24 hr tablet Take 60 mg by mouth daily.       . pantoprazole (PROTONIX) 40 MG tablet Take 40 mg by mouth 2 (two) times daily.       . prenatal vitamin w/FE, FA (PRENATAL 1 + 1) 27-1 MG TABS Take 1 tablet by mouth daily.         Past Medical History  Diagnosis Date  . Hypertension   . Acid reflux     Past Surgical History  Procedure Date  . Cesarean section   . Dilation and curettage of uterus   . Wisdom tooth extraction     No family history on file.  History   Social History  . Marital Status: Divorced    Spouse Name: N/A    Number of Children: N/A  . Years of Education: N/A   Occupational History  . Not on file.   Social History Main Topics  . Smoking status: Never Smoker   . Smokeless tobacco: Not on file  . Alcohol Use: No  . Drug Use: No  . Sexually Active: Yes   Other Topics  Concern  . Not on file   Social History Narrative  . No narrative on file   ROS:  As stated in the HPI and negative for all other systems.  PHYSICAL EXAM BP 116/68  Pulse 83  Ht 5' 4"  (1.626 m)  Wt 179 lb (81.194 kg)  BMI 30.73 kg/m2 GENERAL:  Well appearing HEENT:  Pupils equal round and reactive, fundi not visualized, oral mucosa unremarkable NECK:  No jugular venous distention, waveform within normal limits, carotid upstroke brisk and symmetric, no bruits, no thyromegaly LYMPHATICS:  No cervical, inguinal adenopathy LUNGS:  Clear to auscultation bilaterally BACK:  No CVA tenderness CHEST:  Unremarkable HEART:  PMI not displaced or sustained,S1 and S2 within normal limits, no S3, no S4, no clicks, no rubs, no murmurs ABD:  Flat, positive bowel sounds normal in frequency in pitch, no bruits, no rebound, no guarding, no midline pulsatile mass, no hepatomegaly, no splenomegaly, gravid EXT:  2 plus pulses throughout, no edema, no cyanosis no clubbing SKIN:  No rashes no nodules NEURO:  Cranial nerves II through XII grossly intact, motor grossly intact throughout PSYCH:  Cognitively intact, oriented to person place and time  EKG:  Sinus rhythm, rate 83, axis within normal limits, intervals within normal limits, no acute ST-T wave changes.   ASSESSMENT AND PLAN

## 2011-05-26 NOTE — Assessment & Plan Note (Signed)
Her blood pressure is well controlled.  I will defer further management to her primary providers.

## 2011-06-02 ENCOUNTER — Ambulatory Visit (HOSPITAL_COMMUNITY): Payer: Medicaid Other | Attending: Cardiology | Admitting: Radiology

## 2011-06-02 ENCOUNTER — Other Ambulatory Visit (INDEPENDENT_AMBULATORY_CARE_PROVIDER_SITE_OTHER): Payer: Medicaid Other | Admitting: *Deleted

## 2011-06-02 DIAGNOSIS — O99891 Other specified diseases and conditions complicating pregnancy: Secondary | ICD-10-CM | POA: Insufficient documentation

## 2011-06-02 DIAGNOSIS — R000 Tachycardia, unspecified: Secondary | ICD-10-CM

## 2011-06-02 DIAGNOSIS — R0609 Other forms of dyspnea: Secondary | ICD-10-CM

## 2011-06-02 DIAGNOSIS — I379 Nonrheumatic pulmonary valve disorder, unspecified: Secondary | ICD-10-CM | POA: Insufficient documentation

## 2011-06-02 DIAGNOSIS — I2510 Atherosclerotic heart disease of native coronary artery without angina pectoris: Secondary | ICD-10-CM | POA: Insufficient documentation

## 2011-06-02 DIAGNOSIS — I079 Rheumatic tricuspid valve disease, unspecified: Secondary | ICD-10-CM | POA: Insufficient documentation

## 2011-06-02 DIAGNOSIS — I059 Rheumatic mitral valve disease, unspecified: Secondary | ICD-10-CM | POA: Insufficient documentation

## 2011-06-02 DIAGNOSIS — R0600 Dyspnea, unspecified: Secondary | ICD-10-CM

## 2011-06-02 DIAGNOSIS — R0989 Other specified symptoms and signs involving the circulatory and respiratory systems: Secondary | ICD-10-CM | POA: Insufficient documentation

## 2011-06-02 DIAGNOSIS — R002 Palpitations: Secondary | ICD-10-CM | POA: Insufficient documentation

## 2011-06-02 DIAGNOSIS — I10 Essential (primary) hypertension: Secondary | ICD-10-CM | POA: Insufficient documentation

## 2011-06-03 LAB — BRAIN NATRIURETIC PEPTIDE: Pro B Natriuretic peptide (BNP): 50 pg/mL (ref 0.0–100.0)

## 2011-06-24 ENCOUNTER — Encounter (HOSPITAL_COMMUNITY): Payer: Self-pay | Admitting: Pharmacist

## 2011-06-24 ENCOUNTER — Encounter (HOSPITAL_COMMUNITY): Payer: Self-pay

## 2011-06-24 NOTE — Patient Instructions (Addendum)
   Your procedure is scheduled on: Tuesday December 18th  Enter through the Main Entrance of [facility] at: 9:30am Pick up the phone at the desk and dial [phone] and inform us of your arrival.  Please call this number if you have any problems the morning of surgery: [phone]  Remember: Do not eat food after midnight: Monday Do not drink clear liquids after:midnight Monday Take these medicines the morning of surgery with a SIP OF WATER: protonix, nifedipine  Do not wear jewelry, make-up, or FINGER nail polish Do not wear lotions, powders, perfumes or deodorant. Do not shave 48 hours prior to surgery. Do not bring valuables to the hospital.  Leave suitcase in the car. After Surgery it may be brought to your room. For patients being admitted to the hospital, checkout time is 11:00am the day of discharge.   Remember to use your hibiclens as instructed.Please shower with 1/2 bottle the evening before your surgery and the other 1/2 bottle the morning of surgery.

## 2011-06-28 ENCOUNTER — Encounter (HOSPITAL_COMMUNITY): Payer: Self-pay

## 2011-06-28 ENCOUNTER — Encounter (HOSPITAL_COMMUNITY)
Admission: RE | Admit: 2011-06-28 | Discharge: 2011-06-28 | Disposition: A | Discharge status: Home / Self Care | Payer: Medicaid Other | Source: Ambulatory Visit | Attending: Obstetrics & Gynecology | Admitting: Obstetrics & Gynecology

## 2011-06-28 HISTORY — DX: Shortness of breath: R06.02

## 2011-06-28 LAB — CBC
MCH: 27.5 pg (ref 26.0–34.0)
MCHC: 32.8 g/dL (ref 30.0–36.0)
Platelets: 226 10*3/uL (ref 150–400)
RBC: 3.67 MIL/uL — ABNORMAL LOW (ref 3.87–5.11)
RDW: 14.5 % (ref 11.5–15.5)

## 2011-06-28 LAB — SURGICAL PCR SCREEN: MRSA, PCR: NEGATIVE

## 2011-06-28 NOTE — Pre-Procedure Instructions (Signed)
Pt prenatal record says B positive blood type- [facility] lab resulted B negative Pt states she is B positive

## 2011-06-28 NOTE — Pre-Procedure Instructions (Addendum)
Was seen at [facility] 11/8-with c/o sob-History: PMH: [redacted] weeks pregnant. Acquired from the patient and from the patient's chart. Tachycardia and dyspnea. Risk factors: Hypertension.  ------------------------------------------------------------ Study Conclusions ECHO done-on e-record  - Left ventricle: The cavity size was normal. Wall thickness was normal. Systolic function was normal. The estimated ejection fraction was in the range of 55% to 60%. - Atrial septum: No defect or patent foramen ovale was identified. EKG on electronic record

## 2011-07-04 ENCOUNTER — Other Ambulatory Visit: Payer: Self-pay | Admitting: Obstetrics & Gynecology

## 2011-07-04 MED ORDER — DEXTROSE 5 % IV SOLN
2.0000 g | INTRAVENOUS | Status: AC
  Administered 2011-07-05: 2 g via INTRAVENOUS
  Filled 2011-07-04: qty 2

## 2011-07-04 NOTE — H&P (Signed)
Marissa Blankenship is a 45 year old female admitted at [redacted] weeks gestation (at the recommendation of MFM) for delivery by repeat C/S and a Tubal Sterilization procedure.  She was delivered at 24 weeks with her last pregnancy by Classical C/S because of severe hypertension ?preeclampsia and reversed end diastolic flow on U/S of her baby, which expired shortly after birth.  She is a G5P3 (last one died at 70 weeks).  This pregnancy has done well and she has been maintained on Procardia 60 mg XL daily during the entire pregnancy.  EDC 08-02-11.  -GBS.    PE:  BP 100/60 Chest - clear C-V - RR without Murmur Abd - S+36  FH + CX:  0/0  vtx -3 Ext:  -  Imp:  36 week IUP, Previous Classical C/S, Desire for Tubal Sterilization (understands permanent procedure and possible failure), Chronic Hypertension, controlled, AMA,   Plan:  Repeat C/S at [redacted] weeks gestation (per MFM recommendation) and Tubal Sterilization procedure.

## 2011-07-05 ENCOUNTER — Other Ambulatory Visit: Payer: Self-pay | Admitting: Obstetrics & Gynecology

## 2011-07-05 ENCOUNTER — Encounter (HOSPITAL_COMMUNITY): Payer: Self-pay | Admitting: Anesthesiology

## 2011-07-05 ENCOUNTER — Encounter (HOSPITAL_COMMUNITY)
Admission: RE | Disposition: A | Discharge status: Home / Self Care | Payer: Self-pay | Source: Ambulatory Visit | Attending: Obstetrics & Gynecology

## 2011-07-05 ENCOUNTER — Inpatient Hospital Stay (HOSPITAL_COMMUNITY)
Admission: RE | Admit: 2011-07-05 | Discharge: 2011-07-08 | DRG: 765 | Disposition: A | Discharge status: Home / Self Care | Payer: Medicaid Other | Source: Ambulatory Visit | Attending: Obstetrics & Gynecology | Admitting: Obstetrics & Gynecology

## 2011-07-05 ENCOUNTER — Inpatient Hospital Stay (HOSPITAL_COMMUNITY): Payer: Medicaid Other | Admitting: Anesthesiology

## 2011-07-05 ENCOUNTER — Encounter (HOSPITAL_COMMUNITY): Payer: Self-pay | Admitting: *Deleted

## 2011-07-05 DIAGNOSIS — O9903 Anemia complicating the puerperium: Secondary | ICD-10-CM | POA: Diagnosis not present

## 2011-07-05 DIAGNOSIS — D649 Anemia, unspecified: Secondary | ICD-10-CM | POA: Diagnosis not present

## 2011-07-05 DIAGNOSIS — Z302 Encounter for sterilization: Secondary | ICD-10-CM

## 2011-07-05 DIAGNOSIS — O34219 Maternal care for unspecified type scar from previous cesarean delivery: Principal | ICD-10-CM | POA: Diagnosis present

## 2011-07-05 LAB — COMPREHENSIVE METABOLIC PANEL
ALT: 10 U/L (ref 0–35)
AST: 14 U/L (ref 0–37)
CO2: 20 mEq/L (ref 19–32)
Calcium: 8.9 mg/dL (ref 8.4–10.5)
Creatinine, Ser: 0.59 mg/dL (ref 0.50–1.10)
GFR calc Af Amer: >90 mL/min (ref >90)
GFR calc non Af Amer: >90 mL/min (ref >90)
Sodium: 134 mEq/L — ABNORMAL LOW (ref 135–145)
Total Protein: 6.8 g/dL (ref 6.0–8.3)

## 2011-07-05 SURGERY — Surgical Case
Anesthesia: Spinal | Laterality: Bilateral

## 2011-07-05 MED ORDER — IBUPROFEN 600 MG PO TABS
600.0000 mg | ORAL_TABLET | Freq: Four times a day (QID) | ORAL | Status: DC | PRN
  Filled 2011-07-05 (×2): qty 1

## 2011-07-05 MED ORDER — BISACODYL 10 MG RE SUPP: 10.0000 mg | Freq: Every day | RECTAL | Status: DC | PRN

## 2011-07-05 MED ORDER — SIMETHICONE 80 MG PO CHEW
80.0000 mg | CHEWABLE_TABLET | ORAL | Status: DC | PRN
  Administered 2011-07-06: 80 mg via ORAL

## 2011-07-05 MED ORDER — DIPHENHYDRAMINE HCL 50 MG/ML IJ SOLN: 25.0000 mg | INTRAMUSCULAR | Status: DC | PRN

## 2011-07-05 MED ORDER — ONDANSETRON HCL 4 MG/2ML IJ SOLN
INTRAMUSCULAR | Status: DC | PRN
  Administered 2011-07-05: 4 mg via INTRAVENOUS

## 2011-07-05 MED ORDER — DIPHENHYDRAMINE HCL 25 MG PO CAPS: 25.0000 mg | ORAL_CAPSULE | ORAL | Status: DC | PRN

## 2011-07-05 MED ORDER — MORPHINE SULFATE 0.5 MG/ML IJ SOLN
INTRAMUSCULAR | Status: AC
  Filled 2011-07-05: qty 10

## 2011-07-05 MED ORDER — OXYTOCIN 10 UNIT/ML IJ SOLN
INTRAMUSCULAR | Status: AC
  Filled 2011-07-05: qty 2

## 2011-07-05 MED ORDER — METOCLOPRAMIDE HCL 5 MG/ML IJ SOLN: 10.0000 mg | Freq: Three times a day (TID) | INTRAMUSCULAR | Status: DC | PRN

## 2011-07-05 MED ORDER — ONDANSETRON HCL 4 MG/2ML IJ SOLN: 4.0000 mg | Freq: Three times a day (TID) | INTRAMUSCULAR | Status: DC | PRN

## 2011-07-05 MED ORDER — MORPHINE SULFATE (PF) 0.5 MG/ML IJ SOLN
INTRAMUSCULAR | Status: DC | PRN
  Administered 2011-07-05: .1 ug via INTRATHECAL

## 2011-07-05 MED ORDER — PHENYLEPHRINE HCL 10 MG/ML IJ SOLN
INTRAMUSCULAR | Status: DC | PRN
  Administered 2011-07-05 (×2): 80 ug via INTRAVENOUS
  Administered 2011-07-05 (×2): 40 ug via INTRAVENOUS
  Administered 2011-07-05 (×3): 80 ug via INTRAVENOUS
  Administered 2011-07-05 (×2): 40 ug via INTRAVENOUS

## 2011-07-05 MED ORDER — HYDROMORPHONE HCL PF 1 MG/ML IJ SOLN: 0.2500 mg | INTRAMUSCULAR | Status: DC | PRN

## 2011-07-05 MED ORDER — KETOROLAC TROMETHAMINE 30 MG/ML IJ SOLN
30.0000 mg | Freq: Four times a day (QID) | INTRAMUSCULAR | Status: AC | PRN
  Administered 2011-07-05: 30 mg via INTRAVENOUS
  Filled 2011-07-05: qty 1

## 2011-07-05 MED ORDER — TETANUS-DIPHTH-ACELL PERTUSSIS 5-2.5-18.5 LF-MCG/0.5 IM SUSP
0.5000 mL | Freq: Once | INTRAMUSCULAR | Status: AC
  Administered 2011-07-06: 0.5 mL via INTRAMUSCULAR
  Filled 2011-07-05: qty 0.5

## 2011-07-05 MED ORDER — OXYTOCIN 20 UNITS IN LACTATED RINGERS INFUSION - SIMPLE: 125.0000 mL/h | INTRAVENOUS | Status: AC

## 2011-07-05 MED ORDER — BUPIVACAINE IN DEXTROSE 0.75-8.25 % IT SOLN
INTRATHECAL | Status: DC | PRN
  Administered 2011-07-05: 1.5 mL via INTRATHECAL

## 2011-07-05 MED ORDER — FENTANYL CITRATE 0.05 MG/ML IJ SOLN
INTRAMUSCULAR | Status: DC | PRN
  Administered 2011-07-05: 15 ug via INTRATHECAL

## 2011-07-05 MED ORDER — ONDANSETRON HCL 4 MG PO TABS: 4.0000 mg | ORAL_TABLET | ORAL | Status: DC | PRN

## 2011-07-05 MED ORDER — OXYTOCIN 20 UNITS IN LACTATED RINGERS INFUSION - SIMPLE
INTRAVENOUS | Status: DC | PRN
  Administered 2011-07-05: 20 [IU] via INTRAVENOUS

## 2011-07-05 MED ORDER — LACTATED RINGERS IV SOLN
INTRAVENOUS | Status: DC
  Administered 2011-07-05: 14:00:00 via INTRAVENOUS

## 2011-07-05 MED ORDER — KETOROLAC TROMETHAMINE 60 MG/2ML IM SOLN
60.0000 mg | Freq: Once | INTRAMUSCULAR | Status: AC | PRN
  Administered 2011-07-05: 60 mg via INTRAMUSCULAR

## 2011-07-05 MED ORDER — WITCH HAZEL-GLYCERIN EX PADS: 1.0000 "application " | MEDICATED_PAD | CUTANEOUS | Status: DC | PRN

## 2011-07-05 MED ORDER — LANOLIN HYDROUS EX OINT: 1.0000 "application " | TOPICAL_OINTMENT | CUTANEOUS | Status: DC | PRN

## 2011-07-05 MED ORDER — DIBUCAINE 1 % RE OINT: 1.0000 "application " | TOPICAL_OINTMENT | RECTAL | Status: DC | PRN

## 2011-07-05 MED ORDER — ONDANSETRON HCL 4 MG/2ML IJ SOLN: 4.0000 mg | INTRAMUSCULAR | Status: DC | PRN

## 2011-07-05 MED ORDER — FENTANYL CITRATE 0.05 MG/ML IJ SOLN
INTRAMUSCULAR | Status: AC
  Filled 2011-07-05: qty 2

## 2011-07-05 MED ORDER — KETOROLAC TROMETHAMINE 60 MG/2ML IM SOLN
INTRAMUSCULAR | Status: AC
  Administered 2011-07-05: 60 mg via INTRAMUSCULAR
  Filled 2011-07-05: qty 2

## 2011-07-05 MED ORDER — NALBUPHINE HCL 10 MG/ML IJ SOLN
5.0000 mg | INTRAMUSCULAR | Status: DC | PRN
  Filled 2011-07-05: qty 1

## 2011-07-05 MED ORDER — PHENYLEPHRINE 40 MCG/ML (10ML) SYRINGE FOR IV PUSH (FOR BLOOD PRESSURE SUPPORT)
PREFILLED_SYRINGE | INTRAVENOUS | Status: AC
  Filled 2011-07-05: qty 10

## 2011-07-05 MED ORDER — IBUPROFEN 600 MG PO TABS
600.0000 mg | ORAL_TABLET | Freq: Four times a day (QID) | ORAL | Status: DC
  Administered 2011-07-05 – 2011-07-08 (×10): 600 mg via ORAL
  Filled 2011-07-05 (×8): qty 1

## 2011-07-05 MED ORDER — SCOPOLAMINE 1 MG/3DAYS TD PT72
MEDICATED_PATCH | TRANSDERMAL | Status: AC
  Administered 2011-07-05: 1.5 mg
  Filled 2011-07-05: qty 1

## 2011-07-05 MED ORDER — NALOXONE HCL 0.4 MG/ML IJ SOLN: 0.4000 mg | INTRAMUSCULAR | Status: DC | PRN

## 2011-07-05 MED ORDER — PRENATAL MULTIVITAMIN CH
1.0000 | ORAL_TABLET | Freq: Every day | ORAL | Status: DC
  Administered 2011-07-06 – 2011-07-08 (×3): 1 via ORAL
  Filled 2011-07-05 (×6): qty 1

## 2011-07-05 MED ORDER — ZOLPIDEM TARTRATE 5 MG PO TABS: 5.0000 mg | ORAL_TABLET | Freq: Every evening | ORAL | Status: DC | PRN

## 2011-07-05 MED ORDER — SODIUM CHLORIDE 0.9 % IV SOLN
1.0000 ug/kg/h | INTRAVENOUS | Status: DC | PRN
  Filled 2011-07-05: qty 2.5

## 2011-07-05 MED ORDER — KETOROLAC TROMETHAMINE 30 MG/ML IJ SOLN: 30.0000 mg | Freq: Four times a day (QID) | INTRAMUSCULAR | Status: AC | PRN

## 2011-07-05 MED ORDER — SIMETHICONE 80 MG PO CHEW
80.0000 mg | CHEWABLE_TABLET | Freq: Three times a day (TID) | ORAL | Status: DC
  Administered 2011-07-05 – 2011-07-08 (×9): 80 mg via ORAL

## 2011-07-05 MED ORDER — NALBUPHINE HCL 10 MG/ML IJ SOLN
5.0000 mg | INTRAMUSCULAR | Status: DC | PRN
Start: 2011-07-05 — End: 2011-07-08
  Filled 2011-07-05: qty 1

## 2011-07-05 MED ORDER — ONDANSETRON HCL 4 MG/2ML IJ SOLN
INTRAMUSCULAR | Status: AC
  Filled 2011-07-05: qty 2

## 2011-07-05 MED ORDER — MEPERIDINE HCL 25 MG/ML IJ SOLN: 6.2500 mg | INTRAMUSCULAR | Status: DC | PRN

## 2011-07-05 MED ORDER — DIPHENHYDRAMINE HCL 50 MG/ML IJ SOLN: 12.5000 mg | INTRAMUSCULAR | Status: DC | PRN

## 2011-07-05 MED ORDER — PHENYLEPHRINE 40 MCG/ML (10ML) SYRINGE FOR IV PUSH (FOR BLOOD PRESSURE SUPPORT)
PREFILLED_SYRINGE | INTRAVENOUS | Status: AC
  Filled 2011-07-05: qty 5

## 2011-07-05 MED ORDER — NIFEDIPINE ER 60 MG PO TB24
60.0000 mg | ORAL_TABLET | Freq: Every day | ORAL | Status: DC
  Administered 2011-07-06 – 2011-07-07 (×2): 60 mg via ORAL
  Filled 2011-07-05 (×3): qty 1

## 2011-07-05 MED ORDER — OXYTOCIN 20 UNITS IN LACTATED RINGERS INFUSION - SIMPLE
INTRAVENOUS | Status: AC
  Administered 2011-07-05: 20 [IU]
  Filled 2011-07-05: qty 1000

## 2011-07-05 MED ORDER — PANTOPRAZOLE SODIUM 40 MG PO TBEC
40.0000 mg | DELAYED_RELEASE_TABLET | Freq: Two times a day (BID) | ORAL | Status: DC
  Administered 2011-07-06 – 2011-07-07 (×4): 40 mg via ORAL
  Filled 2011-07-05 (×7): qty 1

## 2011-07-05 MED ORDER — DIPHENHYDRAMINE HCL 25 MG PO CAPS: 25.0000 mg | ORAL_CAPSULE | Freq: Four times a day (QID) | ORAL | Status: DC | PRN

## 2011-07-05 MED ORDER — SENNOSIDES-DOCUSATE SODIUM 8.6-50 MG PO TABS
2.0000 | ORAL_TABLET | Freq: Every day | ORAL | Status: DC
  Administered 2011-07-05 – 2011-07-07 (×3): 2 via ORAL

## 2011-07-05 MED ORDER — LACTATED RINGERS IV SOLN: INTRAVENOUS | Status: DC

## 2011-07-05 MED ORDER — SCOPOLAMINE 1 MG/3DAYS TD PT72
1.0000 | MEDICATED_PATCH | Freq: Once | TRANSDERMAL | Status: DC
  Filled 2011-07-05: qty 1

## 2011-07-05 MED ORDER — OXYCODONE-ACETAMINOPHEN 5-325 MG PO TABS
1.0000 | ORAL_TABLET | ORAL | Status: DC | PRN
  Administered 2011-07-06 – 2011-07-07 (×7): 1 via ORAL
  Administered 2011-07-08: 2 via ORAL
  Filled 2011-07-05 (×9): qty 1

## 2011-07-05 MED ORDER — MENTHOL 3 MG MT LOZG: 1.0000 | LOZENGE | OROMUCOSAL | Status: DC | PRN

## 2011-07-05 MED ORDER — LACTATED RINGERS IV SOLN
INTRAVENOUS | Status: DC
  Administered 2011-07-05 (×2): via INTRAVENOUS

## 2011-07-05 MED ORDER — SODIUM CHLORIDE 0.9 % IJ SOLN: 3.0000 mL | INTRAMUSCULAR | Status: DC | PRN

## 2011-07-05 SURGICAL SUPPLY — 31 items
CLOTH BEACON ORANGE TIMEOUT ST (SAFETY) ×2 IMPLANT
CONTAINER PREFILL 10% NBF 15ML (MISCELLANEOUS) ×4 IMPLANT
DRESSING TELFA 8X3 (GAUZE/BANDAGES/DRESSINGS) ×2 IMPLANT
DRSG COVADERM 4X8 (GAUZE/BANDAGES/DRESSINGS) ×2 IMPLANT
DRSG PAD ABDOMINAL 8X10 ST (GAUZE/BANDAGES/DRESSINGS) ×2 IMPLANT
ELECT REM PT RETURN 9FT ADLT (ELECTROSURGICAL) ×2
ELECTRODE REM PT RTRN 9FT ADLT (ELECTROSURGICAL) ×1 IMPLANT
EXTRACTOR VACUUM M CUP 4 TUBE (SUCTIONS) IMPLANT
GAUZE SPONGE 4X4 12PLY STRL LF (GAUZE/BANDAGES/DRESSINGS) ×4 IMPLANT
GLOVE BIO SURGEON STRL SZ7.5 (GLOVE) ×4 IMPLANT
GOWN PREVENTION PLUS LG XLONG (DISPOSABLE) ×4 IMPLANT
GOWN PREVENTION PLUS XLARGE (GOWN DISPOSABLE) ×2 IMPLANT
KIT ABG SYR 3ML LUER SLIP (SYRINGE) IMPLANT
NEEDLE HYPO 25X5/8 SAFETYGLIDE (NEEDLE) IMPLANT
NS IRRIG 1000ML POUR BTL (IV SOLUTION) ×2 IMPLANT
PACK C SECTION WH (CUSTOM PROCEDURE TRAY) ×2 IMPLANT
PAD ABD 7.5X8 STRL (GAUZE/BANDAGES/DRESSINGS) ×2 IMPLANT
SLEEVE SCD COMPRESS KNEE MED (MISCELLANEOUS) IMPLANT
STAPLER VISISTAT 35W (STAPLE) IMPLANT
SUT PLAIN 1 NONE 54 (SUTURE) ×2 IMPLANT
SUT PROLENE 0 TP 1 60 (SUTURE) IMPLANT
SUT VIC AB 0 CT1 27 (SUTURE) ×3
SUT VIC AB 0 CT1 27XBRD ANBCTR (SUTURE) ×3 IMPLANT
SUT VIC AB 1 CTX 36 (SUTURE) ×2
SUT VIC AB 1 CTX36XBRD ANBCTRL (SUTURE) ×2 IMPLANT
SUT VIC AB 3-0 SH 27 (SUTURE)
SUT VIC AB 3-0 SH 27X BRD (SUTURE) IMPLANT
SUT VICRYL 4-0 PS2 18IN ABS (SUTURE) IMPLANT
TOWEL OR 17X24 6PK STRL BLUE (TOWEL DISPOSABLE) ×4 IMPLANT
TRAY FOLEY CATH 14FR (SET/KITS/TRAYS/PACK) ×2 IMPLANT
WATER STERILE IRR 1000ML POUR (IV SOLUTION) ×2 IMPLANT

## 2011-07-05 NOTE — Addendum Note (Signed)
Addendum  created 07/05/11 1655 by Ailene Ards, RN   Modules edited:Notes Section

## 2011-07-05 NOTE — Anesthesia Procedure Notes (Signed)
Spinal  Patient location during procedure: OR Start time: 07/05/2011 11:01 AM Staffing Performed by: anesthesiologist  Preanesthetic Checklist Completed: patient identified, site marked, surgical consent, pre-op evaluation, timeout performed, IV checked, risks and benefits discussed and monitors and equipment checked Spinal Block Patient position: sitting Prep: site prepped and draped and DuraPrep Patient monitoring: heart rate, cardiac monitor, continuous pulse ox and blood pressure Approach: midline Location: L3-4 Injection technique: single-shot Needle Needle type: Tuohy and Pencan  Needle gauge: 25 G Needle length: 9 cm Needle insertion depth: 7 cm Assessment Sensory level: T4 Additional Notes Unable to locate CSF with sprotte.  Switched to 81 ga tuohy with immediate LOR at 7 cm, 25 ga pencan passed via tuohy, clear free flow CSF obtained.  Dose given.  All needles removed.  Patient tolerated procedure well.  Charlton Haws, MD

## 2011-07-05 NOTE — Transfer of Care (Signed)
Immediate Anesthesia Transfer of Care Note  Patient: Marissa Blankenship  Procedure(s) Performed:  CESAREAN SECTION WITH BILATERAL TUBAL LIGATION - Repeat  Patient Location: PACU  Anesthesia Type: Spinal  Level of Consciousness: awake  Airway & Oxygen Therapy: Patient Spontanous Breathing  Post-op Assessment: Report given to PACU RN  Post vital signs: Reviewed and stable  Complications: No apparent anesthesia complications

## 2011-07-05 NOTE — Anesthesia Postprocedure Evaluation (Signed)
Anesthesia Post Note  Patient: Marissa Blankenship  Procedure(s) Performed:  CESAREAN SECTION WITH BILATERAL TUBAL LIGATION - Repeat  Anesthesia type: Spinal  Patient location: PACU  Post pain: Pain level controlled  Post assessment: Post-op Vital signs reviewed  Post vital signs: Reviewed  Level of consciousness: awake  Complications: No apparent anesthesia complications

## 2011-07-05 NOTE — Anesthesia Postprocedure Evaluation (Signed)
  Anesthesia Post-op Note  Patient: Marissa Blankenship  Procedure(s) Performed:  CESAREAN SECTION WITH BILATERAL TUBAL LIGATION - Repeat  Patient Location: PACU and Mother/Baby  Anesthesia Type: Spinal  Level of Consciousness: awake, alert  and oriented  Airway and Oxygen Therapy: Patient Spontanous Breathing  Post-op Pain: none  Post-op Assessment: Post-op Vital signs reviewed  Post-op Vital Signs: Reviewed and stable  Complications: No apparent anesthesia complications

## 2011-07-05 NOTE — Op Note (Signed)
Preoperative diagnoses  36 week intrauterine pregnancy, previous classical cesarean section, desire for permanent elective sterilization.   Postop diagnoses  Same plus delivery of 5 lbs. 12 oz. Female infant Apgars 9 and 9 and pathology pending segment of each fallopian tube.  Procedure  Repeat low transverse cesarean section and Pomeroy tubal sterilization procedure  Surgeon-Dr. Stann Mainland  Asst.-Dr. Alden Hipp  Anesthesia-subarachnoid block Dr. Primitivo Gauze  History-this 45 year old patient who has been managed with Procardia during this pregnancy for chronic hypertension is being taken to the operating room at [redacted] weeks gestation (per recommendation of MFM )for delivery by repeat cesarean section and she will undergo a tubal sterilization procedure as well. The patient had a classical cesarean section with her last pregnancy at [redacted] weeks gestation for fetal indications having been admitted at that time for uncontrolled hypertension.  That baby expired after birth. The patient is aware that a permanent sterilization procedure is meant to be 100% permanent but unfortunately is not 100% perfect and subsequent pregnancy may result.  Procedure-the patient was taken to the operating room and a subarachnoid block was carried out by Dr. Primitivo Gauze without difficulty.  Thereafter she was placed in the supine position and slightly tilted to the left. A timeout was carried out. The patient's preoperative antibiotics had been previously administered. At this time he was prepped and draped in usual fashion with a Foley catheter placed as part of the prep.  Once good anesthetic level documented procedure was begun. A Pfannenstiel incision was made with minimal difficulty dissected down to and through the fascia in a low transverse fashion with hemostasis created at each layer. Subfascial space was created inferiorly and superiorly muscle separated in midline peritoneum identified and entered properly with care taken to  avoid the bowel superiorly the bladder inferiorly. No adhesions were encountered at this time the vesicouterine peritoneum was identified incised in low transverse fashion and pushed off the lower segment with ease. Sharp incision into the lower segment was then made using Metzenbaum scissors and a low transverse fashion and the cavity was entered amniotomy produced-fluid was clear. At this time a viable female infant was delivered from a vertex position and cried spontaneously once. After the cord was clamped and cut the infant was passed off to the waiting team and ultimately the baby was taken to the nursery in good condition. Weight was found to be 5 lbs. 12 oz. And Apgars were 9 and 9. Placenta was delivered intact and sent to pathology appropriately labeled. At this time good uterine contractile it was a forward was given intravenous Pitocin and manual stimulation. Closure of the uterine incision was then carried out after the cavity was noted to be free of any remaining products of conception. The cavity was closed with a single layer of #1 Vicryl in running fashion and this was oversewn with several figure-of-eight #1 Vicryl's with excellent hemostasis resulting. At this time attention was turned to the tubes and ovaries. Both tubes and ovaries appeared totally normal and a modified Pomeroy tubal sterilization procedure was carried out. A segment of tube was elevated and a free tie of #1 plain was tied down about the knuckle of tube-double ties were used-and the segment of tube was removed and sent to pathology appropriately labeled. This was carried out on both fallopian tubes. At this time the uterine incision was reinspected and noted to be dry. The uterus was then replaced into the abdominal cavity and at the abdomen was lavaged of all blood and clot.  At this time no foreign bodies were noted be remaining in the abdominal cavity and all counts were noted to be correct. The uterine incision was dry and the  site of each tubal sterilization procedure was likewise dry. At this time closure of the abdomen was carried out in layers.  The abdominal peritoneum was closed with 0 Vicryl in a running fashion muscles secured the same. Assured the subfascial hemostasis the fascia was then reapproximated using 0 Vicryl from angle to midline bilaterally. Subcutaneous tissue was rendered hemostatic and staples were used to close the skin. Dressing was applied and at this time the patient was transferred to recovery in satisfactory condition having tolerated procedure well. Estimated blood loss 600 cc. All counts correct x2.  In summary this patient underwent a repeat cesarean section with delivery of a 5 lbs. 12 oz. Female infant with good Apgars at [redacted] weeks gestation-per recommendation of maternal fetal medicine because of a history of a previous classical cesarean section with her last pregnancy at [redacted] weeks gestation. At the conclusion procedure both mother and baby were doing well in their respective recovery areas.

## 2011-07-05 NOTE — Consult Note (Signed)
Neonatology Note:   Attendance at C-section:    I was asked to attend this repeat C/S at term. The mother is a G5P3A1 B neg, GBS unknown with previous classical c/s incision. ROM at delivery, fluid clear. Infant vigorous with good spontaneous cry and tone. Needed only minimal bulb suctioning. Ap 9/9. Lungs clear to ausc in DR. To CN to care of Pediatrician.   Caleb Popp, MD

## 2011-07-05 NOTE — Progress Notes (Signed)
UR chart review completed.  

## 2011-07-05 NOTE — Anesthesia Preprocedure Evaluation (Signed)
Anesthesia Evaluation  Patient identified by MRN, date of birth, ID band Patient awake    Reviewed: Allergy & Precautions, H&P , Patient's Chart, lab work & pertinent test results  Airway Mallampati: II TM Distance: >3 FB Neck ROM: full    Dental No notable dental hx.    Pulmonary  clear to auscultation  Pulmonary exam normal       Cardiovascular Exercise Tolerance: Good hypertension, On Medications regular Normal    Neuro/Psych    GI/Hepatic GERD-  Medicated,  Endo/Other    Renal/GU      Musculoskeletal   Abdominal   Peds  Hematology   Anesthesia Other Findings   Reproductive/Obstetrics                           Anesthesia Physical Anesthesia Plan  ASA: II  Anesthesia Plan: Spinal   Post-op Pain Management:    Induction:   Airway Management Planned:   Additional Equipment:   Intra-op Plan:   Post-operative Plan:   Informed Consent: I have reviewed the patients History and Physical, chart, labs and discussed the procedure including the risks, benefits and alternatives for the proposed anesthesia with the patient or authorized representative who has indicated his/her understanding and acceptance.   Dental Advisory Given  Plan Discussed with: CRNA  Anesthesia Plan Comments: (Lab work confirmed with CRNA in room. Platelets okay. Discussed spinal anesthetic, and patient consents to the procedure:  included risk of possible headache,backache, failed block, allergic reaction, and nerve injury. This patient was asked if she had any questions or concerns before the procedure started. )        Anesthesia Quick Evaluation

## 2011-07-05 NOTE — H&P (Signed)
Marissa Blankenship has been examined by me today and there have been no changes to her status.  We are doing a Rpt C/S and Tubal Sterilization.

## 2011-07-06 LAB — CBC
HCT: 27.1 % — ABNORMAL LOW (ref 36.0–46.0)
Hemoglobin: 8.7 g/dL — ABNORMAL LOW (ref 12.0–15.0)
MCHC: 32.1 g/dL (ref 30.0–36.0)
RDW: 14.5 % (ref 11.5–15.5)
WBC: 8.6 10*3/uL (ref 4.0–10.5)

## 2011-07-06 MED ORDER — FERROUS SULFATE 325 (65 FE) MG PO TABS
325.0000 mg | ORAL_TABLET | Freq: Two times a day (BID) | ORAL | Status: DC
  Administered 2011-07-06 – 2011-07-08 (×4): 325 mg via ORAL
  Filled 2011-07-06 (×4): qty 1

## 2011-07-06 MED ORDER — RHO D IMMUNE GLOBULIN 1500 UNIT/2ML IJ SOLN
300.0000 ug | Freq: Once | INTRAMUSCULAR | Status: AC
  Administered 2011-07-06: 300 ug via INTRAMUSCULAR
  Filled 2011-07-06: qty 2

## 2011-07-06 NOTE — Progress Notes (Signed)
POD#1 Pt without c/o. Pt is anemic but asymptomatic. Tolerating diet VSSAF Hgb-8.7 Abd-nt IMP / Anemic Plan/ Routine care.

## 2011-07-07 ENCOUNTER — Other Ambulatory Visit (HOSPITAL_COMMUNITY): Payer: Medicaid Other

## 2011-07-07 LAB — RH IG WORKUP (INCLUDES ABO/RH)
ABO/RH(D): B NEG
DAT, IgG: NEGATIVE
Fetal Screen: NEGATIVE
Gestational Age(Wks): 36
Unit division: 0

## 2011-07-07 NOTE — Progress Notes (Signed)
Doing very well as is the baby.  Mom will call office to arrange circ.  Breast feeding.  CBC 12/19  8.7 (was 10.1 pre op) /8.6 with 194 K platelets.  Has showered, D/Ced dressing and +flatus.  Likely D/C to home tomorrow.  Will need to continue procardia 60 mg XL and protonix daily.

## 2011-07-08 MED ORDER — PANTOPRAZOLE SODIUM 40 MG PO TBEC: 40.0000 mg | DELAYED_RELEASE_TABLET | Freq: Two times a day (BID) | ORAL | Status: DC

## 2011-07-08 MED ORDER — OXYCODONE-ACETAMINOPHEN 5-325 MG PO TABS: 1.0000 | ORAL_TABLET | ORAL | Status: AC | PRN

## 2011-07-08 MED ORDER — NIFEDIPINE ER OSMOTIC RELEASE 30 MG PO TB24: 30.0000 mg | ORAL_TABLET | Freq: Every day | ORAL | Status: DC

## 2011-07-08 NOTE — Discharge Summary (Addendum)
Obstetric Discharge Summary Reason for Admission: cesarean section Prenatal Procedures: none Intrapartum Procedures: cesarean: low cervical, transverse and BTL Postpartum Procedures: none Complications-Operative and Postpartum: none Hemoglobin  Date Value Range Status  07/06/2011 8.7* 12.0-15.0 (g/dL) Final     HCT  Date Value Range Status  07/06/2011 27.1* 36.0-46.0 (%) Final   Hospital Course:  Pt admitted for routine repeat c/s and underwent uncomplicated surgery with BTL.  Otherwise uncomplicated postop course and d/ced on pod 3.  Discharge Diagnoses: Term Pregnancy-delivered  Discharge Information: Date: 07/08/2011 Activity: pelvic rest Diet: routine Medications: Percocet, Procardia 30 XL, Protonix, Iron Condition: stable Instructions: refer to practice specific booklet Discharge to: home Follow-up Information    Follow up with Paulo Fruit. Make an appointment in 1 week.   Contact information:   [address] [place] [phone]          Newborn Data: Live born female  Birth Weight: 5 lb 12.4 oz (2620 g) APGAR: 9, 9  Home with mother.  Shamika Pedregon A 07/08/2011, 7:52 AM

## 2011-07-08 NOTE — Progress Notes (Signed)
  Patient is eating, ambulating, voiding.  Pain control is good.  Filed Vitals:   07/07/11 1033 07/07/11 1402 07/07/11 2112 07/08/11 0502  BP: 121/73 123/74 108/72 107/69  Pulse: 76 99 109 84  Temp:  99.1 F (37.3 C) 98.4 F (36.9 C) 98.7 F (37.1 C)  TempSrc:  Oral Oral Oral  Resp: 18 20 18 18   Weight:      SpO2:        lungs:   clear to auscultation cor:    RRR Abdomen:  soft, appropriate tenderness, incisions intact and without erythema or exudate ex:    no cords   Lab Results  Component Value Date   WBC 8.6 07/06/2011   HGB 8.7* 07/06/2011   HCT 27.1* 07/06/2011   MCV 83.4 07/06/2011   PLT 194 07/06/2011    --/--/B NEG (12/19 0550)/RI  A/P    Post operative day 3.  Routine post op and postpartum care.  Expect d/c today.  Percocet for pain control.  Will d/c with Procardia 30 XL instead of 60 secondary BPs on low side with 60 XL.

## 2012-09-27 ENCOUNTER — Emergency Department (HOSPITAL_COMMUNITY)
Admission: EM | Admit: 2012-09-27 | Discharge: 2012-09-27 | Disposition: A | Discharge status: Home / Self Care | Payer: Self-pay | Source: Home / Self Care

## 2012-10-02 ENCOUNTER — Other Ambulatory Visit: Payer: Self-pay | Admitting: Internal Medicine

## 2012-10-29 ENCOUNTER — Ambulatory Visit
Admission: RE | Admit: 2012-10-29 | Discharge: 2012-10-29 | Disposition: A | Discharge status: Home / Self Care | Payer: Medicaid Other | Source: Ambulatory Visit | Attending: Internal Medicine | Admitting: Internal Medicine

## 2012-10-29 DIAGNOSIS — Z1231 Encounter for screening mammogram for malignant neoplasm of breast: Secondary | ICD-10-CM

## 2012-12-20 ENCOUNTER — Other Ambulatory Visit: Payer: Self-pay | Admitting: Internal Medicine

## 2012-12-28 ENCOUNTER — Other Ambulatory Visit: Payer: Medicaid Other

## 2012-12-31 ENCOUNTER — Ambulatory Visit
Admission: RE | Admit: 2012-12-31 | Discharge: 2012-12-31 | Disposition: A | Discharge status: Home / Self Care | Payer: Medicaid Other | Source: Ambulatory Visit | Attending: Internal Medicine | Admitting: Internal Medicine

## 2013-03-25 ENCOUNTER — Other Ambulatory Visit: Payer: Self-pay | Admitting: Obstetrics & Gynecology

## 2014-04-07 ENCOUNTER — Other Ambulatory Visit: Payer: Self-pay | Admitting: Obstetrics & Gynecology

## 2014-04-08 LAB — CYTOLOGY - PAP

## 2014-05-19 ENCOUNTER — Encounter (HOSPITAL_COMMUNITY): Payer: Self-pay | Admitting: *Deleted

## 2015-09-14 ENCOUNTER — Other Ambulatory Visit: Payer: Self-pay | Admitting: Obstetrics & Gynecology

## 2015-09-14 DIAGNOSIS — N852 Hypertrophy of uterus: Secondary | ICD-10-CM | POA: Insufficient documentation

## 2015-09-15 LAB — CYTOLOGY - PAP

## 2015-10-12 ENCOUNTER — Encounter (HOSPITAL_COMMUNITY): Payer: Self-pay | Admitting: *Deleted

## 2015-10-12 ENCOUNTER — Emergency Department (HOSPITAL_COMMUNITY): Payer: Medicaid Other

## 2015-10-12 ENCOUNTER — Emergency Department (HOSPITAL_COMMUNITY)
Admission: EM | Admit: 2015-10-12 | Discharge: 2015-10-12 | Disposition: A | Discharge status: Home / Self Care | Payer: Medicaid Other | Attending: Emergency Medicine | Admitting: Emergency Medicine

## 2015-10-12 DIAGNOSIS — I10 Essential (primary) hypertension: Secondary | ICD-10-CM | POA: Diagnosis not present

## 2015-10-12 DIAGNOSIS — R109 Unspecified abdominal pain: Secondary | ICD-10-CM | POA: Diagnosis present

## 2015-10-12 DIAGNOSIS — Z8719 Personal history of other diseases of the digestive system: Secondary | ICD-10-CM | POA: Insufficient documentation

## 2015-10-12 DIAGNOSIS — Z3202 Encounter for pregnancy test, result negative: Secondary | ICD-10-CM | POA: Insufficient documentation

## 2015-10-12 DIAGNOSIS — R1084 Generalized abdominal pain: Secondary | ICD-10-CM | POA: Diagnosis not present

## 2015-10-12 DIAGNOSIS — Z9889 Other specified postprocedural states: Secondary | ICD-10-CM | POA: Diagnosis not present

## 2015-10-12 DIAGNOSIS — G8929 Other chronic pain: Secondary | ICD-10-CM | POA: Insufficient documentation

## 2015-10-12 DIAGNOSIS — Z79899 Other long term (current) drug therapy: Secondary | ICD-10-CM | POA: Diagnosis not present

## 2015-10-12 LAB — COMPREHENSIVE METABOLIC PANEL
ALK PHOS: 63 U/L (ref 38–126)
ALT: 10 U/L — AB (ref 14–54)
AST: 13 U/L — AB (ref 15–41)
Albumin: 3.4 g/dL — ABNORMAL LOW (ref 3.5–5.0)
Anion gap: 12 (ref 5–15)
BILIRUBIN TOTAL: 0.7 mg/dL (ref 0.3–1.2)
BUN: 7 mg/dL (ref 6–20)
CALCIUM: 9.2 mg/dL (ref 8.9–10.3)
CO2: 27 mmol/L (ref 22–32)
CREATININE: 0.73 mg/dL (ref 0.44–1.00)
Chloride: 97 mmol/L — ABNORMAL LOW (ref 101–111)
Glucose, Bld: 91 mg/dL (ref 65–99)
Potassium: 3.5 mmol/L (ref 3.5–5.1)
Sodium: 136 mmol/L (ref 135–145)
TOTAL PROTEIN: 8.2 g/dL — AB (ref 6.5–8.1)

## 2015-10-12 LAB — CBC
HCT: 36.9 % (ref 36.0–46.0)
Hemoglobin: 11.6 g/dL — ABNORMAL LOW (ref 12.0–15.0)
MCH: 24.6 pg — ABNORMAL LOW (ref 26.0–34.0)
MCHC: 31.4 g/dL (ref 30.0–36.0)
MCV: 78.3 fL (ref 78.0–100.0)
PLATELETS: 430 10*3/uL — AB (ref 150–400)
RBC: 4.71 MIL/uL (ref 3.87–5.11)
RDW: 16 % — AB (ref 11.5–15.5)
WBC: 8.3 10*3/uL (ref 4.0–10.5)

## 2015-10-12 LAB — URINALYSIS, ROUTINE W REFLEX MICROSCOPIC
Bilirubin Urine: NEGATIVE
GLUCOSE, UA: NEGATIVE mg/dL
KETONES UR: 40 mg/dL — AB
NITRITE: NEGATIVE
PROTEIN: 30 mg/dL — AB
Specific Gravity, Urine: 1.011 (ref 1.005–1.030)
pH: 6 (ref 5.0–8.0)

## 2015-10-12 LAB — URINE MICROSCOPIC-ADD ON

## 2015-10-12 LAB — PREGNANCY, URINE: Preg Test, Ur: NEGATIVE

## 2015-10-12 LAB — LIPASE, BLOOD: Lipase: 18 U/L (ref 11–51)

## 2015-10-12 MED ORDER — IOHEXOL 300 MG/ML  SOLN
50.0000 mL | Freq: Once | INTRAMUSCULAR | Status: AC | PRN
  Administered 2015-10-12: 50 mL via ORAL

## 2015-10-12 MED ORDER — HYDROCODONE-ACETAMINOPHEN 5-325 MG PO TABS
1.0000 | ORAL_TABLET | Freq: Four times a day (QID) | ORAL | Status: DC | PRN
Start: 2015-10-12 — End: 2015-11-21

## 2015-10-12 MED ORDER — MORPHINE SULFATE (PF) 4 MG/ML IV SOLN
4.0000 mg | Freq: Once | INTRAVENOUS | Status: AC
  Administered 2015-10-12: 4 mg via INTRAVENOUS
  Filled 2015-10-12: qty 1

## 2015-10-12 MED ORDER — PROMETHAZINE HCL 25 MG PO TABS: 25.0000 mg | ORAL_TABLET | Freq: Three times a day (TID) | ORAL | Status: DC | PRN

## 2015-10-12 MED ORDER — IOPAMIDOL (ISOVUE-300) INJECTION 61%
100.0000 mL | Freq: Once | INTRAVENOUS | Status: AC | PRN
  Administered 2015-10-12: 100 mL via INTRAVENOUS

## 2015-10-12 MED ORDER — SODIUM CHLORIDE 0.9 % IV BOLUS (SEPSIS)
1000.0000 mL | Freq: Once | INTRAVENOUS | Status: AC
  Administered 2015-10-12: 1000 mL via INTRAVENOUS

## 2015-10-12 MED ORDER — OXYCODONE-ACETAMINOPHEN 5-325 MG PO TABS
1.0000 | ORAL_TABLET | ORAL | Status: DC | PRN
  Administered 2015-10-12: 1 via ORAL
  Filled 2015-10-12: qty 1

## 2015-10-12 NOTE — Discharge Instructions (Signed)
Return here as needed.  Follow-up with the GI Dr. provided

## 2015-10-12 NOTE — ED Notes (Signed)
Pt reports chronic abd pain x 1 month, became severe Thursday last week.  Has been taking medicine for it without relief.  Has been seeing Dr. Jeanie Cooks for her pain.  She states that she was supposed to see a GI MD to r/o crohn's disease but is not able to do the test due to insurance issues.  Pt also reports n/v.

## 2015-10-12 NOTE — ED Notes (Signed)
Pain medication given in Triage. Patient advised about side effects of medications and  to avoid driving for a minimum of 4 hours.

## 2015-10-12 NOTE — ED Provider Notes (Signed)
CSN: [id number]     Arrival date & time 10/12/15  1117 History   First MD Initiated Contact with Patient 10/12/15 1342     Chief Complaint  Patient presents with  . Abdominal Pain     (Consider location/radiation/quality/duration/timing/severity/associated sxs/prior Treatment) HPI Patient presents to the emergency department with worsening chronic abdominal pain over the last 5 days.  The patient states that she has had this abdominal pain over the last 18 years, but got worse over the last 5 days.  Patient states she did not take any medications prior to arrival for symptoms.  Patient denies chest pain, shortness of breath, headache, blurred vision, back pain, neck pain, dysuria, fever, incontinence, bloody stool, hematemesis, back pain, neck pain, rash, near syncope or syncope.  The patient states that palpation makes her pain worse Past Medical History  Diagnosis Date  . Hypertension   . Acid reflux   . Shortness of breath    Past Surgical History  Procedure Laterality Date  . Cesarean section    . Dilation and curettage of uterus    . Wisdom tooth extraction     Family History  Problem Relation Age of Onset  . Hypertension Father 28  . Hypertension Mother 59   Social History  Substance Use Topics  . Smoking status: Never Smoker   . Smokeless tobacco: None  . Alcohol Use: No   OB History    Gravida Para Term Preterm AB TAB SAB Ectopic Multiple Living   5 4  3 1 1    3      Review of Systems  All other systems negative except as documented in the HPI. All pertinent positives and negatives as reviewed in the HPI.  Allergies  Review of patient's allergies indicates no known allergies.  Home Medications   Prior to Admission medications   Medication Sig Start Date End Date Taking? Authorizing Provider  cholecalciferol (VITAMIN D) 1000 units tablet Take by mouth daily.   Yes Historical Provider, MD  dicyclomine (BENTYL) 20 MG tablet Take 20 mg by mouth 3 (three) times  daily. 09/14/15  Yes Historical Provider, MD  ferrous sulfate 325 (65 FE) MG tablet Take 325 mg by mouth daily with breakfast.   Yes Historical Provider, MD  lisinopril (PRINIVIL,ZESTRIL) 20 MG tablet Take 20 mg by mouth every morning. 10/07/15  Yes Historical Provider, MD  metoprolol tartrate (LOPRESSOR) 25 MG tablet Take 25 mg by mouth daily. 09/19/15  Yes Historical Provider, MD   BP 169/103 mmHg  Pulse 73  Temp(Src) 98.2 F (36.8 C) (Oral)  Resp 18  SpO2 97%  LMP 10/05/2015 Physical Exam  Constitutional: She is oriented to person, place, and time. She appears well-developed and well-nourished. No distress.  HENT:  Head: Normocephalic and atraumatic.  Mouth/Throat: Oropharynx is clear and moist.  Eyes: Pupils are equal, round, and reactive to light.  Neck: Normal range of motion. Neck supple.  Cardiovascular: Normal rate, regular rhythm and normal heart sounds.  Exam reveals no gallop and no friction rub.   No murmur heard. Pulmonary/Chest: Effort normal and breath sounds normal. No respiratory distress. She has no wheezes.  Abdominal: Soft. Bowel sounds are normal. She exhibits no distension. There is tenderness. There is no rebound and no guarding.  Neurological: She is alert and oriented to person, place, and time. She exhibits normal muscle tone. Coordination normal.  Skin: Skin is warm and dry. No rash noted. No erythema.  Psychiatric: She has a normal mood and affect. Her  behavior is normal.  Nursing note and vitals reviewed.   ED Course  Procedures (including critical care time) Labs Review Labs Reviewed  COMPREHENSIVE METABOLIC PANEL - Abnormal; Notable for the following:    Chloride 97 (*)    Total Protein 8.2 (*)    Albumin 3.4 (*)    AST 13 (*)    ALT 10 (*)    All other components within normal limits  CBC - Abnormal; Notable for the following:    Hemoglobin 11.6 (*)    MCH 24.6 (*)    RDW 16.0 (*)    Platelets 430 (*)    All other components within normal  limits  URINALYSIS, ROUTINE W REFLEX MICROSCOPIC (NOT AT [facility]) - Abnormal; Notable for the following:    APPearance CLOUDY (*)    Hgb urine dipstick LARGE (*)    Ketones, ur 40 (*)    Protein, ur 30 (*)    Leukocytes, UA LARGE (*)    All other components within normal limits  URINE MICROSCOPIC-ADD ON - Abnormal; Notable for the following:    Squamous Epithelial / LPF 0-5 (*)    Bacteria, UA FEW (*)    All other components within normal limits  LIPASE, BLOOD  PREGNANCY, URINE    Imaging Review No results found. I have personally reviewed and evaluated these images and lab results as part of my medical decision-making.  Patient will have CT scan performed.  She is advised of the plan and all questions were answered.   Dalia Heading, PA-C 10/12/15 1638  Milton Ferguson, MD 10/13/15 [phone]

## 2015-10-13 ENCOUNTER — Encounter: Payer: Self-pay | Admitting: Gastroenterology

## 2015-11-20 ENCOUNTER — Emergency Department (HOSPITAL_COMMUNITY)
Admission: EM | Admit: 2015-11-20 | Discharge: 2015-11-21 | Disposition: A | Discharge status: Home / Self Care | Payer: Medicaid Other | Attending: Emergency Medicine | Admitting: Emergency Medicine

## 2015-11-20 ENCOUNTER — Encounter (HOSPITAL_COMMUNITY): Payer: Self-pay | Admitting: *Deleted

## 2015-11-20 ENCOUNTER — Emergency Department (HOSPITAL_COMMUNITY): Payer: Medicaid Other

## 2015-11-20 DIAGNOSIS — Z79899 Other long term (current) drug therapy: Secondary | ICD-10-CM | POA: Diagnosis not present

## 2015-11-20 DIAGNOSIS — R1110 Vomiting, unspecified: Secondary | ICD-10-CM | POA: Insufficient documentation

## 2015-11-20 DIAGNOSIS — Z8719 Personal history of other diseases of the digestive system: Secondary | ICD-10-CM | POA: Insufficient documentation

## 2015-11-20 DIAGNOSIS — I10 Essential (primary) hypertension: Secondary | ICD-10-CM | POA: Diagnosis not present

## 2015-11-20 DIAGNOSIS — R1084 Generalized abdominal pain: Secondary | ICD-10-CM | POA: Diagnosis present

## 2015-11-20 DIAGNOSIS — R109 Unspecified abdominal pain: Secondary | ICD-10-CM

## 2015-11-20 DIAGNOSIS — Z3202 Encounter for pregnancy test, result negative: Secondary | ICD-10-CM | POA: Diagnosis not present

## 2015-11-20 LAB — POC URINE PREG, ED: Preg Test, Ur: NEGATIVE

## 2015-11-20 LAB — COMPREHENSIVE METABOLIC PANEL
ALT: 13 U/L — ABNORMAL LOW (ref 14–54)
ANION GAP: 12 (ref 5–15)
AST: 14 U/L — AB (ref 15–41)
Albumin: 2.8 g/dL — ABNORMAL LOW (ref 3.5–5.0)
Alkaline Phosphatase: 68 U/L (ref 38–126)
BUN: 10 mg/dL (ref 6–20)
CHLORIDE: 99 mmol/L — AB (ref 101–111)
CO2: 28 mmol/L (ref 22–32)
Calcium: 8.9 mg/dL (ref 8.9–10.3)
Creatinine, Ser: 0.71 mg/dL (ref 0.44–1.00)
Glucose, Bld: 91 mg/dL (ref 65–99)
Potassium: 3.7 mmol/L (ref 3.5–5.1)
Sodium: 139 mmol/L (ref 135–145)
Total Bilirubin: 0.7 mg/dL (ref 0.3–1.2)
Total Protein: 7.2 g/dL (ref 6.5–8.1)

## 2015-11-20 LAB — CBC
HEMATOCRIT: 29.9 % — AB (ref 36.0–46.0)
HEMOGLOBIN: 9.4 g/dL — AB (ref 12.0–15.0)
MCH: 23.4 pg — ABNORMAL LOW (ref 26.0–34.0)
MCHC: 31.4 g/dL (ref 30.0–36.0)
MCV: 74.6 fL — AB (ref 78.0–100.0)
Platelets: 536 10*3/uL — ABNORMAL HIGH (ref 150–400)
RBC: 4.01 MIL/uL (ref 3.87–5.11)
RDW: 16.7 % — ABNORMAL HIGH (ref 11.5–15.5)
WBC: 8.7 10*3/uL (ref 4.0–10.5)

## 2015-11-20 LAB — URINALYSIS, ROUTINE W REFLEX MICROSCOPIC
GLUCOSE, UA: NEGATIVE mg/dL
Ketones, ur: 40 mg/dL — AB
NITRITE: POSITIVE — AB
PH: 5.5 (ref 5.0–8.0)
Protein, ur: 30 mg/dL — AB
Specific Gravity, Urine: 1.022 (ref 1.005–1.030)

## 2015-11-20 LAB — URINE MICROSCOPIC-ADD ON

## 2015-11-20 LAB — LIPASE, BLOOD: LIPASE: 15 U/L (ref 11–51)

## 2015-11-20 MED ORDER — MORPHINE SULFATE (PF) 4 MG/ML IV SOLN
6.0000 mg | Freq: Once | INTRAVENOUS | Status: AC
  Administered 2015-11-20: 6 mg via INTRAVENOUS
  Filled 2015-11-20: qty 2

## 2015-11-20 MED ORDER — DIATRIZOATE MEGLUMINE & SODIUM 66-10 % PO SOLN: 15.0000 mL | Freq: Once | ORAL | Status: DC | PRN

## 2015-11-20 MED ORDER — IOPAMIDOL (ISOVUE-300) INJECTION 61%
100.0000 mL | Freq: Once | INTRAVENOUS | Status: AC | PRN
  Administered 2015-11-20: 100 mL via INTRAVENOUS

## 2015-11-20 NOTE — ED Notes (Signed)
Pt c/o mid and lower abdominal pain x 2 months. Nausea and vomiting "on and off" for the same amount of time. More vomiting frequent vomiting over the last two days.

## 2015-11-20 NOTE — ED Notes (Signed)
Patient transported to CT 

## 2015-11-20 NOTE — ED Provider Notes (Signed)
CSN: [id number]     Arrival date & time 11/20/15  1852 History   First MD Initiated Contact with Patient 11/20/15 2136     Chief Complaint  Patient presents with  . Abdominal Pain     (Consider location/radiation/quality/duration/timing/severity/associated sxs/prior Treatment) Patient is a 50 y.o. female presenting with abdominal pain.  Abdominal Pain Associated symptoms: vomiting    Complains of diffuse abdominal pain for the past 2 months, becoming worse this past week. Other associated symptoms include vomiting 1 time yesterday. She is not nauseated today. Last bowel movement was yesterday, no blood per rectum. She denies any fever. Nothing makes pain better or worse. Last normal limits. 2 weeks ago. Pain is constant. No treatment prior to coming here. Patient was seen here for similar complaint 10/12/2014 determined to have suspected Crohn's disease. Prescribed Phenergan and hydrocodone-APAP. She reports that she has run out of hydrocodone-APAPM, and has only a few Phenergan tablets She denies any fever. Denies nausea at present. No other associated symptoms. Past Medical History  Diagnosis Date  . Hypertension   . Acid reflux   . Shortness of breath    Past Surgical History  Procedure Laterality Date  . Cesarean section    . Dilation and curettage of uterus    . Wisdom tooth extraction     Family History  Problem Relation Age of Onset  . Hypertension Father 16  . Hypertension Mother 79   Social History  Substance Use Topics  . Smoking status: Never Smoker   . Smokeless tobacco: None  . Alcohol Use: No   OB History    Gravida Para Term Preterm AB TAB SAB Ectopic Multiple Living   5 4  3 1 1    3      Review of Systems  Constitutional: Negative.   HENT: Negative.   Respiratory: Negative.   Cardiovascular: Negative.   Gastrointestinal: Positive for vomiting and abdominal pain.  Musculoskeletal: Negative.   Skin: Negative.   Neurological: Negative.    Psychiatric/Behavioral: Negative.   All other systems reviewed and are negative.     Allergies  Review of patient's allergies indicates no known allergies.  Home Medications   Prior to Admission medications   Medication Sig Start Date End Date Taking? Authorizing Provider  cholecalciferol (VITAMIN D) 1000 units tablet Take by mouth daily.    Historical Provider, MD  dicyclomine (BENTYL) 20 MG tablet Take 20 mg by mouth 3 (three) times daily. 09/14/15   Historical Provider, MD  ferrous sulfate 325 (65 FE) MG tablet Take 325 mg by mouth daily with breakfast.    Historical Provider, MD  HYDROcodone-acetaminophen (NORCO/VICODIN) 5-325 MG tablet Take 1 tablet by mouth every 6 (six) hours as needed for moderate pain. 10/12/15   Dalia Heading, PA-C  lisinopril (PRINIVIL,ZESTRIL) 20 MG tablet Take 20 mg by mouth every morning. 10/07/15   Historical Provider, MD  metoprolol tartrate (LOPRESSOR) 25 MG tablet Take 25 mg by mouth daily. 09/19/15   Historical Provider, MD  promethazine (PHENERGAN) 25 MG tablet Take 1 tablet (25 mg total) by mouth every 8 (eight) hours as needed for nausea or vomiting. 10/12/15   Dalia Heading, PA-C   BP 154/88 mmHg  Pulse 62  Temp(Src) 99.1 F (37.3 C) (Oral)  Resp 16  Ht 5' 4"  (1.626 m)  Wt 165 lb (74.844 kg)  BMI 28.31 kg/m2  SpO2 97%  LMP 11/06/2015 Physical Exam  Constitutional: She appears well-developed and well-nourished. No distress.  HENT:  Head: Normocephalic and  atraumatic.  Eyes: Conjunctivae are normal. Pupils are equal, round, and reactive to light.  Neck: Neck supple. No tracheal deviation present. No thyromegaly present.  Cardiovascular: Normal rate and regular rhythm.   No murmur heard. Pulmonary/Chest: Effort normal and breath sounds normal.  Abdominal: Soft. Bowel sounds are normal. She exhibits no distension. There is tenderness.  Mild diffuse tenderness  Musculoskeletal: Normal range of motion. She exhibits no edema or  tenderness.  Neurological: She is alert. Coordination normal.  Skin: Skin is warm and dry. No rash noted.  Psychiatric: She has a normal mood and affect.  Nursing note and vitals reviewed.   ED Course  Procedures (including critical care time) Labs Review Labs Reviewed  COMPREHENSIVE METABOLIC PANEL - Abnormal; Notable for the following:    Chloride 99 (*)    Albumin 2.8 (*)    AST 14 (*)    ALT 13 (*)    All other components within normal limits  CBC - Abnormal; Notable for the following:    Hemoglobin 9.4 (*)    HCT 29.9 (*)    MCV 74.6 (*)    MCH 23.4 (*)    RDW 16.7 (*)    Platelets 536 (*)    All other components within normal limits  URINALYSIS, ROUTINE W REFLEX MICROSCOPIC (NOT AT [facility]) - Abnormal; Notable for the following:    Color, Urine AMBER (*)    APPearance TURBID (*)    Hgb urine dipstick SMALL (*)    Bilirubin Urine SMALL (*)    Ketones, ur 40 (*)    Protein, ur 30 (*)    Nitrite POSITIVE (*)    Leukocytes, UA LARGE (*)    All other components within normal limits  URINE MICROSCOPIC-ADD ON - Abnormal; Notable for the following:    Squamous Epithelial / LPF 6-30 (*)    Bacteria, UA MANY (*)    All other components within normal limits  LIPASE, BLOOD  POC URINE PREG, ED    Imaging Review No results found. I have personally reviewed and evaluated these images and lab results as part of my medical decision-making.   EKG Interpretation None     12:15 AM pain improved after treatment with intravenous opioids. She is not nauseated. Results for orders placed or performed during the hospital encounter of 11/20/15  Lipase, blood  Result Value Ref Range   Lipase 15 11 - 51 U/L  Comprehensive metabolic panel  Result Value Ref Range   Sodium 139 135 - 145 mmol/L   Potassium 3.7 3.5 - 5.1 mmol/L   Chloride 99 (L) 101 - 111 mmol/L   CO2 28 22 - 32 mmol/L   Glucose, Bld 91 65 - 99 mg/dL   BUN 10 6 - 20 mg/dL   Creatinine, Ser 0.71 0.44 - 1.00 mg/dL    Calcium 8.9 8.9 - 10.3 mg/dL   Total Protein 7.2 6.5 - 8.1 g/dL   Albumin 2.8 (L) 3.5 - 5.0 g/dL   AST 14 (L) 15 - 41 U/L   ALT 13 (L) 14 - 54 U/L   Alkaline Phosphatase 68 38 - 126 U/L   Total Bilirubin 0.7 0.3 - 1.2 mg/dL   GFR calc non Af Amer >60 >60 mL/min   GFR calc Af Amer >60 >60 mL/min   Anion gap 12 5 - 15  CBC  Result Value Ref Range   WBC 8.7 4.0 - 10.5 K/uL   RBC 4.01 3.87 - 5.11 MIL/uL   Hemoglobin 9.4 (L)  12.0 - 15.0 g/dL   HCT 29.9 (L) 36.0 - 46.0 %   MCV 74.6 (L) 78.0 - 100.0 fL   MCH 23.4 (L) 26.0 - 34.0 pg   MCHC 31.4 30.0 - 36.0 g/dL   RDW 16.7 (H) 11.5 - 15.5 %   Platelets 536 (H) 150 - 400 K/uL  Urinalysis, Routine w reflex microscopic  Result Value Ref Range   Color, Urine AMBER (A) YELLOW   APPearance TURBID (A) CLEAR   Specific Gravity, Urine 1.022 1.005 - 1.030   pH 5.5 5.0 - 8.0   Glucose, UA NEGATIVE NEGATIVE mg/dL   Hgb urine dipstick SMALL (A) NEGATIVE   Bilirubin Urine SMALL (A) NEGATIVE   Ketones, ur 40 (A) NEGATIVE mg/dL   Protein, ur 30 (A) NEGATIVE mg/dL   Nitrite POSITIVE (A) NEGATIVE   Leukocytes, UA LARGE (A) NEGATIVE  Urine microscopic-add on  Result Value Ref Range   Squamous Epithelial / LPF 6-30 (A) NONE SEEN   WBC, UA TOO NUMEROUS TO COUNT 0 - 5 WBC/hpf   RBC / HPF 0-5 0 - 5 RBC/hpf   Bacteria, UA MANY (A) NONE SEEN  POC Urine Pregnancy, ED (do NOT order at [facility])  Result Value Ref Range   Preg Test, Ur NEGATIVE NEGATIVE   Ct Abdomen Pelvis W Contrast  11/20/2015  CLINICAL DATA:  Lower abdominal pain for 2 months. EXAM: CT ABDOMEN AND PELVIS WITH CONTRAST TECHNIQUE: Multidetector CT imaging of the abdomen and pelvis was performed using the standard protocol following bolus administration of intravenous contrast. CONTRAST:  124m ISOVUE-300 IOPAMIDOL (ISOVUE-300) INJECTION 61% COMPARISON:  October 12, 2015 FINDINGS: Normal lung bases. No free air. Focal fatty deposition adjacent to the falciform ligament, unchanged. The liver,  gallbladder, and portal vein are otherwise normal. The spleen, adrenal glands, and pancreas are within normal limits. The kidneys are stable with no acute findings. Mild atherosclerotic change in the non aneurysmal aorta. No adenopathy. The stomach is normal in appearance. The jejunum is also unremarkable. The terminal and distal ileum are again markedly abnormal with wall thickening and mucosal enhancement. Evaluation of this region is limited as the contrast does not fill the distal ileum. No definitive extraluminal gas is identified. There is fluid in the pelvis. Some of the fluid is free. Several loops of bowel are fluid filled. There are some pockets of fluid which are more rounded and focal. An oval collection of fluid in the right pelvis measuring 5 x 2.7 cm on image 72 and is thought to be outside of the bowel. A rounded region of fluid adjacent to the superior left uterus could represent another collection of fluid. I believe this is probably separate from the left ovary. The colon is normal in appearance with no inflammation. The appendix is not discretely seen. No adenopathy or aneurysm. The pelvis demonstrates multiple loops of abnormal distal small bowel, free fluid, and possible developing fluid collections as described above. The bladder is unremarkable. No adenopathy. Sclerosis and irregularity of the SI joints, right greater than left, could represent sacroiliitis in this patient with probable inflammatory bowel disease. Visualized bones are otherwise unremarkable. Delayed images demonstrate no filling defects in the upper renal collecting systems. IMPRESSION: 1. Grossly abnormal loops of distal and terminal ileum with wall thickening and mucosal enhancement suggests Crohn's disease. There is clearly reactive free fluid in the pelvis. There may also be developing collections of fluid. Evaluation of the distal small bowel and the potential developing collections of fluid would be more  specific with  contrast in the distal small bowel. No colonic involvement identified. 2. Possible sacroiliitis. Electronically Signed   By: Dorise Bullion III M.D   On: 11/20/2015 23:02    MDM  Case discussed with Dr.NANDIGAM. Hospitalization was offered to patient which she declined. Plan prescription Norco, Phenergan, Cipro, Flagyl. Urine sent for culture. Patient instructed to keep her scheduled appointment at  [facility] gastroenterology office 11/27/2015 Final diagnoses:  None  Abdominal pain is likely secondary to Crohn's disease. She will need evaluation for Crohn's disease as outpatient Diagnosis #1 abdominal pain #2 urinary tract infection #3 anemia     Orlie Dakin, MD 11/21/15 0981

## 2015-11-21 MED ORDER — HYDROCODONE-ACETAMINOPHEN 5-325 MG PO TABS: 1.0000 | ORAL_TABLET | Freq: Four times a day (QID) | ORAL | Status: DC | PRN

## 2015-11-21 MED ORDER — METRONIDAZOLE 500 MG PO TABS: 500.0000 mg | ORAL_TABLET | Freq: Two times a day (BID) | ORAL | Status: DC

## 2015-11-21 MED ORDER — CIPROFLOXACIN HCL 500 MG PO TABS: 500.0000 mg | ORAL_TABLET | Freq: Two times a day (BID) | ORAL | Status: DC

## 2015-11-21 MED ORDER — METRONIDAZOLE 500 MG PO TABS
500.0000 mg | ORAL_TABLET | Freq: Once | ORAL | Status: AC
  Administered 2015-11-21: 500 mg via ORAL
  Filled 2015-11-21: qty 1

## 2015-11-21 MED ORDER — CIPROFLOXACIN HCL 500 MG PO TABS
500.0000 mg | ORAL_TABLET | Freq: Once | ORAL | Status: AC
Start: 2015-11-21 — End: 2015-11-21
  Administered 2015-11-21: 500 mg via ORAL
  Filled 2015-11-21: qty 1

## 2015-11-21 MED ORDER — PROMETHAZINE HCL 25 MG PO TABS: 25.0000 mg | ORAL_TABLET | Freq: Three times a day (TID) | ORAL | Status: DC | PRN

## 2015-11-21 NOTE — Discharge Instructions (Signed)
Take the medication as prescribed. Take Tylenol for mild pain or the pain medicine (Hydrocodone-apap) prescribed for bad pain. Don't take Tylenol with prescribed pain medicine together as the combination can be harmful to your liver. Keep your scheduled appointment at the [facility] gastroenterology office on 11/27/2015. Return if you develop fever, pain is not well controlled, are unable to hold down the medications without vomiting or are concerned for any reason. Your blood pressure should be rechecked within the next 3 weeks. Today's is mildly elevated at 144/91.

## 2015-11-23 LAB — URINE CULTURE: SPECIAL REQUESTS: NORMAL

## 2015-11-24 ENCOUNTER — Telehealth (HOSPITAL_BASED_OUTPATIENT_CLINIC_OR_DEPARTMENT_OTHER): Payer: Self-pay | Admitting: Emergency Medicine

## 2015-11-24 NOTE — Telephone Encounter (Signed)
Post ED Visit - Positive Culture Follow-up  Culture report reviewed by antimicrobial stewardship pharmacist:  []  Elenor Quinones, Pharm.D. []  Heide Guile, Pharm.D., BCPS []  Parks Neptune, Pharm.D. []  Alycia Rossetti, Pharm.D., BCPS []  [place], Pharm.D., BCPS, AAHIVP []  Legrand Como, Pharm.D., BCPS, AAHIVP [x]  Milus Glazier, Pharm.D. []  Stephens November, Pharm.D.  Positive urine culture Treated with metronidazole and ciprofloxacin, organism sensitive to the same and no further patient follow-up is required at this time.  Hazle Nordmann 11/24/2015, 9:12 AM

## 2015-11-27 ENCOUNTER — Encounter: Payer: Self-pay | Admitting: Gastroenterology

## 2015-11-27 ENCOUNTER — Ambulatory Visit (INDEPENDENT_AMBULATORY_CARE_PROVIDER_SITE_OTHER): Payer: Medicaid Other | Admitting: Gastroenterology

## 2015-11-27 VITALS — BP 104/70 | HR 64 | Ht 64.0 in | Wt 149.5 lb

## 2015-11-27 DIAGNOSIS — R1032 Left lower quadrant pain: Secondary | ICD-10-CM

## 2015-11-27 DIAGNOSIS — G43A0 Cyclical vomiting, not intractable: Secondary | ICD-10-CM | POA: Diagnosis not present

## 2015-11-27 DIAGNOSIS — R197 Diarrhea, unspecified: Secondary | ICD-10-CM

## 2015-11-27 DIAGNOSIS — R1115 Cyclical vomiting syndrome unrelated to migraine: Secondary | ICD-10-CM

## 2015-11-27 MED ORDER — CIPROFLOXACIN HCL 500 MG PO TABS: 500.0000 mg | ORAL_TABLET | Freq: Two times a day (BID) | ORAL | Status: DC

## 2015-11-27 MED ORDER — HYDROCODONE-ACETAMINOPHEN 5-325 MG PO TABS: 1.0000 | ORAL_TABLET | Freq: Four times a day (QID) | ORAL | Status: DC | PRN

## 2015-11-27 MED ORDER — METRONIDAZOLE 500 MG PO TABS: 500.0000 mg | ORAL_TABLET | Freq: Three times a day (TID) | ORAL | Status: DC

## 2015-11-27 MED ORDER — PREDNISONE 10 MG PO TABS: 40.0000 mg | ORAL_TABLET | Freq: Every day | ORAL | Status: DC

## 2015-11-27 MED ORDER — ONDANSETRON HCL 4 MG PO TABS: 4.0000 mg | ORAL_TABLET | Freq: Three times a day (TID) | ORAL | Status: DC | PRN

## 2015-11-27 MED ORDER — NA SULFATE-K SULFATE-MG SULF 17.5-3.13-1.6 GM/177ML PO SOLN: 1.0000 | Freq: Once | ORAL | Status: DC

## 2015-11-27 NOTE — Patient Instructions (Signed)
If you are age 48 or older, your body mass index should be between 23-30. Your Body mass index is 25.65 kg/(m^2). If this is out of the aforementioned range listed, please consider follow up with your Primary Care Provider.  If you are age 79 or younger, your body mass index should be between 19-25. Your Body mass index is 25.65 kg/(m^2). If this is out of the aformentioned range listed, please consider follow up with your Primary Care Provider.   You have been scheduled for an endoscopy. Please follow written instructions given to you at your visit today. If you use inhalers (even only as needed), please bring them with you on the day of your procedure. Your physician has requested that you go to www.startemmi.com and enter the access code given to you at your visit today. This web site gives a general overview about your procedure. However, you should still follow specific instructions given to you by our office regarding your preparation for the procedure.  Thank you for choosing [facility]  Dr Wilfrid Lund III

## 2015-11-27 NOTE — Progress Notes (Signed)
[facility] Consult Note:  History: Marissa Blankenship 11/27/2015  Referring physician: Elvina Sidle emergency room physician  Reason for consult/chief complaint: Abdominal Pain; nausea and vomiting; Chills; and change in bowel habits   Subjective HPI:  This is a 50 year old woman seen for the first time in our clinic for multitude of GI symptoms. She reports 17 years of chronic abdominal pain and was diagnosed with IBS. She was seen 2 or 3 years ago by Dr. Oletta Lamas of [facility], and reports having an upper endoscopy. She does not recall ever having had a colonoscopy done. She has a constellation of symptoms including generalized abdominal pain, chronic nausea with intermittent vomiting, and intermittent diarrhea. She denies rectal bleeding, but she has lost approximately 25 pounds over the last 6 months. She has had 2 visits to our ED, in late March and just a week ago. On both occasions she had acute worsening of her generalized abdominal pain and multiple episodes of bilious vomiting. Both visits included CT scan of the abdomen which were suggestive of Crohn's disease. The imaging findings for Crohn's were worsening on the most recent CT. Notes from the ED physician indicate that they advised her to be admitted but she declined. However, the patient seems to recall the conversation differently. At any rate, she was sent home on Cipro and Flagyl which it seems like she may have only taken for a few days. She was also given when necessary hydrocodone and Phenergan. She continues to have chronic low-grade periumbilical to right lower quadrant abdominal pain with episodic worsening that has vomiting a few times a week.  ROS:  Review of Systems  Constitutional: Negative for appetite change and unexpected weight change.  HENT: Negative for mouth sores and voice change.   Eyes: Negative for pain and redness.  Respiratory: Negative for cough and shortness of breath.   Cardiovascular: Negative  for chest pain and palpitations.  Genitourinary: Negative for dysuria and hematuria.  Musculoskeletal: Positive for back pain. Negative for myalgias and arthralgias.  Skin: Negative for pallor and rash.  Neurological: Negative for weakness and headaches.  Hematological: Negative for adenopathy.  Psychiatric/Behavioral: Positive for dysphoric mood. The patient is nervous/anxious.    Her back pain is probably lower  Past Medical History: Past Medical History  Diagnosis Date  . Hypertension   . Acid reflux   . Shortness of breath   . Anemia   . IBS (irritable bowel syndrome)     spastic colon     Past Surgical History: Past Surgical History  Procedure Laterality Date  . Cesarean section    . Wisdom tooth extraction       Family History: Family History  Problem Relation Age of Onset  . Hypertension Father 14  . Hypertension Mother 70  . Crohn's disease Sister   . Diabetes Paternal Grandmother   . Hypertension Mother   . Hypertension Father   . Hypertension Brother     Social History: Social History   Social History  . Marital Status: Divorced    Spouse Name: N/A  . Number of Children: 3  . Years of Education: N/A   Occupational History  . stay at home mom    Social History Main Topics  . Smoking status: Former Smoker    Quit date: 07/18/1997  . Smokeless tobacco: Never Used  . Alcohol Use: No  . Drug Use: No  . Sexual Activity: Yes   Other Topics Concern  . None   Social History Narrative  Allergies: No Known Allergies  Outpatient Meds: Current Outpatient Prescriptions  Medication Sig Dispense Refill  . dicyclomine (BENTYL) 20 MG tablet Take 20 mg by mouth 3 (three) times daily as needed (for abdominal pain.).   1  . HYDROcodone-acetaminophen (NORCO) 5-325 MG tablet Take 1-2 tablets by mouth every 6 (six) hours as needed for moderate pain or severe pain. 30 tablet 0  . lisinopril (PRINIVIL,ZESTRIL) 20 MG tablet Take 20 mg by mouth every  morning.  12  . metoprolol tartrate (LOPRESSOR) 25 MG tablet Take 50 mg by mouth daily.   1  . naproxen sodium (ANAPROX) 220 MG tablet Take 220 mg by mouth every 6 (six) hours as needed (for pain.).    Marland Kitchen pantoprazole (PROTONIX) 40 MG tablet Take 40 mg by mouth daily.  3  . promethazine (PHENERGAN) 25 MG tablet Take 1 tablet (25 mg total) by mouth every 8 (eight) hours as needed for nausea or vomiting. 20 tablet 0  . ciprofloxacin (CIPRO) 500 MG tablet Take 1 tablet (500 mg total) by mouth every 12 (twelve) hours. 14 tablet 0  . metroNIDAZOLE (FLAGYL) 500 MG tablet Take 1 tablet (500 mg total) by mouth 3 (three) times daily. One po bid x 7 days 21 tablet 0  . Na Sulfate-K Sulfate-Mg Sulf SOLN Take 1 kit by mouth once. 354 mL 0  . ondansetron (ZOFRAN) 4 MG tablet Take 1 tablet (4 mg total) by mouth every 8 (eight) hours as needed for nausea or vomiting. 30 tablet 1  . predniSONE (DELTASONE) 10 MG tablet Take 4 tablets (40 mg total) by mouth daily with breakfast. 100 tablet 0   No current facility-administered medications for this visit.      ___________________________________________________________________ Objective  Exam:  BP 104/70 mmHg  Pulse 64  Ht 5' 4"  (1.626 m)  Wt 149 lb 8 oz (67.813 kg)  BMI 25.65 kg/m2  LMP 11/06/2015  Breastfeeding? No   General: this is a(n) Middle-aged African-American woman in no acute distress. She does not look acutely ill and certainly nontoxic. Mucous membranes are moist him a vital signs as noted above  Eyes: sclera anicteric, no redness  ENT: oral mucosa moist without lesions, no cervical or supraclavicular lymphadenopathy, good dentition  CV: RRR without murmur, S1/S2, no JVD, no peripheral edema  Resp: clear to auscultation bilaterally, normal RR and effort noted  GI: soft, moderate RLQ and periumbilical tenderness, with active bowel sounds. No guarding or palpable organomegaly noted.  Skin; warm and dry, no rash or jaundice  noted  Neuro: awake, alert and oriented x 3. Normal gross motor function and fluent speech  Labs: Lab Results  Component Value Date   WBC 8.7 11/20/2015   HGB 9.4* 11/20/2015   HCT 29.9* 11/20/2015   MCV 74.6* 11/20/2015   PLT 536* 11/20/2015    CMP Latest Ref Rng 11/20/2015 10/12/2015 07/05/2011  Glucose 65 - 99 mg/dL 91 91 79  BUN 6 - 20 mg/dL 10 7 5(L)  Creatinine 0.44 - 1.00 mg/dL 0.71 0.73 0.59  Sodium 135 - 145 mmol/L 139 136 134(L)  Potassium 3.5 - 5.1 mmol/L 3.7 3.5 3.8  Chloride 101 - 111 mmol/L 99(L) 97(L) 104  CO2 22 - 32 mmol/L 28 27 20   Calcium 8.9 - 10.3 mg/dL 8.9 9.2 8.9  Total Protein 6.5 - 8.1 g/dL 7.2 8.2(H) 6.8  Total Bilirubin 0.3 - 1.2 mg/dL 0.7 0.7 0.2(L)  Alkaline Phos 38 - 126 U/L 68 63 124(H)  AST 15 - 41 U/L  14(L) 13(L) 14  ALT 14 - 54 U/L 13(L) 10(L) 10     Radiologic Studies:  CT march suggestive of Crohn's CT 5/5 was worse  Full reports are in the Epic system. Briefly, there is a long segment of market wall thickening with surrounding fluid in the distal ileum. Her some adjacent loops of fluid-filled small bowel. There is at least 1 fluid collection 5 x 2 cm it is suspected be outside the bowel. There is no free air and no proximal small bowel dilatation and no gastric distention.  I personally reviewed both CT scans in detail.   Assessment: Encounter Diagnoses  Name Primary?  . Abdominal pain, chronic, right lower quadrant Yes  . Abdominal pain, left lower quadrant   . Diarrhea, unspecified type   Cyclical vomiting, which suggests intermittent obstruction due to this process.  This patient appears to have severe ileal Crohn's disease. I think the intra-abdominal and pelvic fluid is reactive to the inflammation and not abscess.  Plan:  Over the course of 60 minute office visit, the following plan was finally agreed upon:  Prednisone 40 mg once daily Ciprofloxacin 500 mg twice daily and metronidazole 500 mg 3 times daily for 7  days Colonoscopy next week  The benefits and risks of the planned procedure were described in detail with the patient or (when appropriate) their health care proxy.  Risks were outlined as including, but not limited to, bleeding, infection, perforation, adverse medication reaction leading to cardiac or pulmonary decompensation, or pancreatitis (if ERCP).  The limitation of incomplete mucosal visualization was also discussed.  No guarantees or warranties were given.  As needed use of Zofran and Vicodin  She understands clearly that if she has significant worsening of symptoms between now and her schedule colonoscopy, such as worsening of abdominal pain, recurrence of vomiting, bili to keep down food or liquids, or fever over 100 agrees Fahrenheit, she should present immediately to the emergency room.  Thank you for the courtesy of this consult.  Please call me with any questions or concerns.  [name]  CC: Dr. Juluis Mire

## 2015-12-01 ENCOUNTER — Encounter (HOSPITAL_COMMUNITY): Payer: Self-pay | Admitting: *Deleted

## 2015-12-01 ENCOUNTER — Telehealth: Payer: Self-pay | Admitting: Gastroenterology

## 2015-12-01 ENCOUNTER — Emergency Department (HOSPITAL_COMMUNITY): Payer: Medicaid Other

## 2015-12-01 ENCOUNTER — Inpatient Hospital Stay (HOSPITAL_COMMUNITY)
Admission: EM | Admit: 2015-12-01 | Discharge: 2015-12-05 | DRG: 386 | Disposition: A | Discharge status: Home / Self Care | Payer: Medicaid Other | Attending: Internal Medicine | Admitting: Internal Medicine

## 2015-12-01 DIAGNOSIS — R1031 Right lower quadrant pain: Secondary | ICD-10-CM | POA: Insufficient documentation

## 2015-12-01 DIAGNOSIS — Z8249 Family history of ischemic heart disease and other diseases of the circulatory system: Secondary | ICD-10-CM

## 2015-12-01 DIAGNOSIS — Z833 Family history of diabetes mellitus: Secondary | ICD-10-CM | POA: Diagnosis not present

## 2015-12-01 DIAGNOSIS — R638 Other symptoms and signs concerning food and fluid intake: Secondary | ICD-10-CM | POA: Diagnosis present

## 2015-12-01 DIAGNOSIS — R1084 Generalized abdominal pain: Secondary | ICD-10-CM | POA: Diagnosis present

## 2015-12-01 DIAGNOSIS — R8271 Bacteriuria: Secondary | ICD-10-CM | POA: Diagnosis present

## 2015-12-01 DIAGNOSIS — E876 Hypokalemia: Secondary | ICD-10-CM | POA: Diagnosis present

## 2015-12-01 DIAGNOSIS — D649 Anemia, unspecified: Secondary | ICD-10-CM | POA: Diagnosis present

## 2015-12-01 DIAGNOSIS — R197 Diarrhea, unspecified: Secondary | ICD-10-CM | POA: Insufficient documentation

## 2015-12-01 DIAGNOSIS — Z23 Encounter for immunization: Secondary | ICD-10-CM | POA: Diagnosis not present

## 2015-12-01 DIAGNOSIS — N390 Urinary tract infection, site not specified: Secondary | ICD-10-CM | POA: Diagnosis present

## 2015-12-01 DIAGNOSIS — K5090 Crohn's disease, unspecified, without complications: Secondary | ICD-10-CM | POA: Diagnosis present

## 2015-12-01 DIAGNOSIS — G8929 Other chronic pain: Secondary | ICD-10-CM | POA: Diagnosis present

## 2015-12-01 DIAGNOSIS — Z79899 Other long term (current) drug therapy: Secondary | ICD-10-CM

## 2015-12-01 DIAGNOSIS — E43 Unspecified severe protein-calorie malnutrition: Secondary | ICD-10-CM | POA: Insufficient documentation

## 2015-12-01 DIAGNOSIS — Z7952 Long term (current) use of systemic steroids: Secondary | ICD-10-CM

## 2015-12-01 DIAGNOSIS — I10 Essential (primary) hypertension: Secondary | ICD-10-CM | POA: Diagnosis present

## 2015-12-01 DIAGNOSIS — Z87891 Personal history of nicotine dependence: Secondary | ICD-10-CM

## 2015-12-01 DIAGNOSIS — K5080 Crohn's disease of both small and large intestine without complications: Principal | ICD-10-CM | POA: Diagnosis present

## 2015-12-01 DIAGNOSIS — R1110 Vomiting, unspecified: Secondary | ICD-10-CM

## 2015-12-01 DIAGNOSIS — K219 Gastro-esophageal reflux disease without esophagitis: Secondary | ICD-10-CM | POA: Diagnosis present

## 2015-12-01 DIAGNOSIS — R1032 Left lower quadrant pain: Secondary | ICD-10-CM

## 2015-12-01 DIAGNOSIS — K5000 Crohn's disease of small intestine without complications: Secondary | ICD-10-CM | POA: Diagnosis not present

## 2015-12-01 DIAGNOSIS — Z8379 Family history of other diseases of the digestive system: Secondary | ICD-10-CM | POA: Diagnosis not present

## 2015-12-01 DIAGNOSIS — G43A0 Cyclical vomiting, not intractable: Secondary | ICD-10-CM | POA: Diagnosis present

## 2015-12-01 LAB — CBC
HCT: 33.2 % — ABNORMAL LOW (ref 36.0–46.0)
HEMOGLOBIN: 10.3 g/dL — AB (ref 12.0–15.0)
MCH: 23.2 pg — AB (ref 26.0–34.0)
MCHC: 31 g/dL (ref 30.0–36.0)
MCV: 74.8 fL — AB (ref 78.0–100.0)
Platelets: 531 10*3/uL — ABNORMAL HIGH (ref 150–400)
RBC: 4.44 MIL/uL (ref 3.87–5.11)
RDW: 17.3 % — ABNORMAL HIGH (ref 11.5–15.5)
WBC: 13.6 10*3/uL — AB (ref 4.0–10.5)

## 2015-12-01 LAB — COMPREHENSIVE METABOLIC PANEL
ALT: 9 U/L — ABNORMAL LOW (ref 14–54)
ANION GAP: 9 (ref 5–15)
AST: 12 U/L — ABNORMAL LOW (ref 15–41)
Albumin: 2.9 g/dL — ABNORMAL LOW (ref 3.5–5.0)
Alkaline Phosphatase: 60 U/L (ref 38–126)
BUN: 9 mg/dL (ref 6–20)
CHLORIDE: 98 mmol/L — AB (ref 101–111)
CO2: 31 mmol/L (ref 22–32)
Calcium: 9 mg/dL (ref 8.9–10.3)
Creatinine, Ser: 0.71 mg/dL (ref 0.44–1.00)
GFR calc non Af Amer: >60 mL/min (ref >60)
Glucose, Bld: 89 mg/dL (ref 65–99)
Potassium: 2.9 mmol/L — ABNORMAL LOW (ref 3.5–5.1)
SODIUM: 138 mmol/L (ref 135–145)
Total Bilirubin: 0.4 mg/dL (ref 0.3–1.2)
Total Protein: 7.1 g/dL (ref 6.5–8.1)

## 2015-12-01 LAB — URINALYSIS, ROUTINE W REFLEX MICROSCOPIC
Bilirubin Urine: NEGATIVE
GLUCOSE, UA: NEGATIVE mg/dL
Ketones, ur: NEGATIVE mg/dL
Nitrite: NEGATIVE
Protein, ur: NEGATIVE mg/dL
SPECIFIC GRAVITY, URINE: 1.012 (ref 1.005–1.030)
pH: 7.5 (ref 5.0–8.0)

## 2015-12-01 LAB — URINE MICROSCOPIC-ADD ON

## 2015-12-01 LAB — PREGNANCY, URINE: Preg Test, Ur: NEGATIVE

## 2015-12-01 LAB — TSH: TSH: 1.839 u[IU]/mL (ref 0.350–4.500)

## 2015-12-01 LAB — PROTIME-INR
INR: 1.05 (ref 0.00–1.49)
PROTHROMBIN TIME: 13.9 s (ref 11.6–15.2)

## 2015-12-01 LAB — MAGNESIUM: MAGNESIUM: 1.9 mg/dL (ref 1.7–2.4)

## 2015-12-01 LAB — LIPASE, BLOOD: LIPASE: 12 U/L (ref 11–51)

## 2015-12-01 MED ORDER — PEG-KCL-NACL-NASULF-NA ASC-C 100 G PO SOLR
0.5000 | Freq: Once | ORAL | Status: DC
  Filled 2015-12-01: qty 1

## 2015-12-01 MED ORDER — METOCLOPRAMIDE HCL 5 MG/ML IJ SOLN
10.0000 mg | Freq: Four times a day (QID) | INTRAMUSCULAR | Status: AC
  Administered 2015-12-01 – 2015-12-02 (×4): 10 mg via INTRAVENOUS
  Filled 2015-12-01 (×4): qty 2

## 2015-12-01 MED ORDER — POLYETHYLENE GLYCOL 3350 17 GM/SCOOP PO POWD
0.5000 | Freq: Once | ORAL | Status: AC
  Administered 2015-12-01: 127.5 g via ORAL
  Filled 2015-12-01: qty 255

## 2015-12-01 MED ORDER — DICYCLOMINE HCL 20 MG PO TABS
20.0000 mg | ORAL_TABLET | Freq: Three times a day (TID) | ORAL | Status: DC | PRN
  Filled 2015-12-01: qty 1

## 2015-12-01 MED ORDER — LACTATED RINGERS IV BOLUS (SEPSIS)
1000.0000 mL | Freq: Once | INTRAVENOUS | Status: AC
  Administered 2015-12-01: 1000 mL via INTRAVENOUS

## 2015-12-01 MED ORDER — MORPHINE SULFATE (PF) 2 MG/ML IV SOLN
1.0000 mg | INTRAVENOUS | Status: DC | PRN
  Administered 2015-12-01 – 2015-12-04 (×5): 1 mg via INTRAVENOUS
  Filled 2015-12-01 (×5): qty 1

## 2015-12-01 MED ORDER — ONDANSETRON HCL 4 MG/2ML IJ SOLN
4.0000 mg | Freq: Four times a day (QID) | INTRAMUSCULAR | Status: DC | PRN
  Administered 2015-12-04 (×2): 4 mg via INTRAVENOUS
  Filled 2015-12-01 (×2): qty 2

## 2015-12-01 MED ORDER — HYDROCODONE-ACETAMINOPHEN 5-325 MG PO TABS
1.0000 | ORAL_TABLET | ORAL | Status: DC | PRN
  Administered 2015-12-02 – 2015-12-04 (×4): 2 via ORAL
  Administered 2015-12-04 (×2): 1 via ORAL
  Administered 2015-12-05: 2 via ORAL
  Filled 2015-12-01 (×3): qty 2
  Filled 2015-12-01: qty 1
  Filled 2015-12-01: qty 2
  Filled 2015-12-01: qty 1
  Filled 2015-12-01: qty 2

## 2015-12-01 MED ORDER — PEG-KCL-NACL-NASULF-NA ASC-C 100 G PO SOLR: 1.0000 | Freq: Once | ORAL | Status: DC

## 2015-12-01 MED ORDER — KCL IN DEXTROSE-NACL 40-5-0.9 MEQ/L-%-% IV SOLN
INTRAVENOUS | Status: DC
Start: 2015-12-01 — End: 2015-12-02
  Administered 2015-12-01: 21:00:00 via INTRAVENOUS
  Filled 2015-12-01 (×3): qty 1000

## 2015-12-01 MED ORDER — ONDANSETRON HCL 4 MG PO TABS: 4.0000 mg | ORAL_TABLET | Freq: Three times a day (TID) | ORAL | Status: DC | PRN

## 2015-12-01 MED ORDER — ACETAMINOPHEN 325 MG PO TABS: 650.0000 mg | ORAL_TABLET | Freq: Four times a day (QID) | ORAL | Status: DC | PRN

## 2015-12-01 MED ORDER — MORPHINE SULFATE (PF) 4 MG/ML IV SOLN
4.0000 mg | Freq: Once | INTRAVENOUS | Status: AC
  Administered 2015-12-01: 4 mg via INTRAVENOUS
  Filled 2015-12-01: qty 1

## 2015-12-01 MED ORDER — POTASSIUM CHLORIDE CRYS ER 20 MEQ PO TBCR
60.0000 meq | EXTENDED_RELEASE_TABLET | Freq: Four times a day (QID) | ORAL | Status: AC
  Administered 2015-12-01 – 2015-12-02 (×2): 60 meq via ORAL
  Filled 2015-12-01 (×2): qty 3

## 2015-12-01 MED ORDER — METHYLPREDNISOLONE SODIUM SUCC 40 MG IJ SOLR
30.0000 mg | Freq: Two times a day (BID) | INTRAMUSCULAR | Status: DC
  Administered 2015-12-01 – 2015-12-02 (×2): 30 mg via INTRAVENOUS
  Filled 2015-12-01 (×2): qty 1

## 2015-12-01 MED ORDER — METOPROLOL TARTRATE 50 MG PO TABS
50.0000 mg | ORAL_TABLET | Freq: Every day | ORAL | Status: DC
  Administered 2015-12-01 – 2015-12-02 (×2): 50 mg via ORAL
  Filled 2015-12-01 (×2): qty 1

## 2015-12-01 MED ORDER — SODIUM CHLORIDE 0.9 % IV SOLN: INTRAVENOUS | Status: DC

## 2015-12-01 MED ORDER — POLYETHYLENE GLYCOL 3350 17 GM/SCOOP PO POWD
0.5000 | Freq: Once | ORAL | Status: AC
  Administered 2015-12-02: 127.5 g via ORAL

## 2015-12-01 MED ORDER — PREDNISONE 20 MG PO TABS: 60.0000 mg | ORAL_TABLET | Freq: Every day | ORAL | Status: DC

## 2015-12-01 MED ORDER — PANTOPRAZOLE SODIUM 40 MG PO TBEC
40.0000 mg | DELAYED_RELEASE_TABLET | Freq: Every day | ORAL | Status: DC
  Administered 2015-12-02 – 2015-12-05 (×4): 40 mg via ORAL
  Filled 2015-12-01 (×4): qty 1

## 2015-12-01 MED ORDER — POTASSIUM CHLORIDE 10 MEQ/100ML IV SOLN
10.0000 meq | Freq: Once | INTRAVENOUS | Status: AC
  Administered 2015-12-01: 10 meq via INTRAVENOUS
  Filled 2015-12-01: qty 100

## 2015-12-01 MED ORDER — METRONIDAZOLE IN NACL 5-0.79 MG/ML-% IV SOLN: 500.0000 mg | Freq: Once | INTRAVENOUS | Status: DC

## 2015-12-01 MED ORDER — PROCHLORPERAZINE EDISYLATE 5 MG/ML IJ SOLN
10.0000 mg | Freq: Four times a day (QID) | INTRAMUSCULAR | Status: DC | PRN
  Administered 2015-12-01: 10 mg via INTRAVENOUS
  Filled 2015-12-01: qty 2

## 2015-12-01 MED ORDER — PEG-KCL-NACL-NASULF-NA ASC-C 100 G PO SOLR: 0.5000 | Freq: Once | ORAL | Status: DC

## 2015-12-01 MED ORDER — POTASSIUM CHLORIDE CRYS ER 20 MEQ PO TBCR: 40.0000 meq | EXTENDED_RELEASE_TABLET | Freq: Once | ORAL | Status: DC

## 2015-12-01 MED ORDER — CIPROFLOXACIN IN D5W 400 MG/200ML IV SOLN
400.0000 mg | Freq: Once | INTRAVENOUS | Status: AC
  Administered 2015-12-01: 400 mg via INTRAVENOUS
  Filled 2015-12-01: qty 200

## 2015-12-01 MED ORDER — ACETAMINOPHEN 650 MG RE SUPP: 650.0000 mg | Freq: Four times a day (QID) | RECTAL | Status: DC | PRN

## 2015-12-01 MED ORDER — CIPROFLOXACIN IN D5W 400 MG/200ML IV SOLN
400.0000 mg | Freq: Two times a day (BID) | INTRAVENOUS | Status: DC
  Administered 2015-12-02 – 2015-12-05 (×7): 400 mg via INTRAVENOUS
  Filled 2015-12-01 (×7): qty 200

## 2015-12-01 MED ORDER — HEPARIN SODIUM (PORCINE) 5000 UNIT/ML IJ SOLN
5000.0000 [IU] | Freq: Three times a day (TID) | INTRAMUSCULAR | Status: DC
  Administered 2015-12-02: 5000 [IU] via SUBCUTANEOUS
  Filled 2015-12-01: qty 1

## 2015-12-01 MED ORDER — METRONIDAZOLE IN NACL 5-0.79 MG/ML-% IV SOLN
500.0000 mg | Freq: Four times a day (QID) | INTRAVENOUS | Status: DC
  Administered 2015-12-02 – 2015-12-03 (×6): 500 mg via INTRAVENOUS
  Filled 2015-12-01 (×6): qty 100

## 2015-12-01 NOTE — Progress Notes (Signed)
Notes from GI and [facility] indicated that pt will have a colonoscopy tomorrow morning. However when admitting orders released, bowel prep that had been ordered was marked as discontinued. Nothing mentioned in progress notes about not going forward with the prep. Pt stated her understanding was that she would have a colonoscopy tomorrow. Paged GI MD for clarification on whether pt needed to complete bowel prep tonight. New orders received to give bowel prep. Pt just started it, will monitor pts tolerance. Hortencia Conradi RN

## 2015-12-01 NOTE — ED Notes (Signed)
MADE FIRST REQUEST FOR URINE SAMPLE,PT UNABLE TO PROVIDE ONE AT THIS TIME.

## 2015-12-01 NOTE — ED Provider Notes (Signed)
CSN: [id number]     Arrival date & time 12/01/15  1257 History   First MD Initiated Contact with Patient 12/01/15 1427     Chief Complaint  Patient presents with  . Abdominal Pain      HPI   50 year old female presents today with complaints of abdominal pain. Patient notes a significant past medical history Crohn's disease. She notes that she's had irritable bowel symptoms for several years, worsening abdominal pains over the last several months. Patient notes that she recently saw a gastroenterologist (Dr. Simona Huh) and was diagnosed with Crohn's disease via CT scan done in the emergency room. She is scheduled for colonoscopy tomorrow. She notes that symptoms are typical of her Crohn's disease, but notes that over the counter and prescription medication or not resolving the pain as the used to. She describes the pain as lower abdominal starting this morning, upper abdominal pain now.  She describes it as stabbing, with associated nausea, no vomiting. Patient denies any fever, chills, diarrhea or constipation. Patient notes she was seen in the emergency room diagnosed with urinary tract infection and Crohn's disease, but has not been taking her antibiotics as they do not sit well on her stomach. No meds prior to arrival   Past Medical History  Diagnosis Date  . Hypertension   . Acid reflux   . Shortness of breath   . Anemia   . IBS (irritable bowel syndrome)     spastic colon   Past Surgical History  Procedure Laterality Date  . Cesarean section    . Wisdom tooth extraction     Family History  Problem Relation Age of Onset  . Hypertension Father 6  . Hypertension Mother 19  . Crohn's disease Sister   . Diabetes Paternal Grandmother   . Hypertension Mother   . Hypertension Father   . Hypertension Brother    Social History  Substance Use Topics  . Smoking status: Former Smoker    Quit date: 07/18/1997  . Smokeless tobacco: Never Used  . Alcohol Use: No   OB History    Gravida Para Term Preterm AB TAB SAB Ectopic Multiple Living   5 4  3 1 1    3      Review of Systems  All other systems reviewed and are negative.   Allergies  Review of patient's allergies indicates no known allergies.  Home Medications   Prior to Admission medications   Medication Sig Start Date End Date Taking? Authorizing Provider  ciprofloxacin (CIPRO) 500 MG tablet Take 1 tablet (500 mg total) by mouth every 12 (twelve) hours. 11/27/15  Yes [name], MD  dicyclomine (BENTYL) 20 MG tablet Take 20 mg by mouth 3 (three) times daily as needed (for abdominal pain.).  09/14/15  Yes Historical Provider, MD  HYDROcodone-acetaminophen (NORCO) 5-325 MG tablet Take 1-2 tablets by mouth every 6 (six) hours as needed for moderate pain or severe pain. 11/27/15  Yes [name], MD  lisinopril (PRINIVIL,ZESTRIL) 20 MG tablet Take 20 mg by mouth every morning. 10/07/15  Yes Historical Provider, MD  metoprolol tartrate (LOPRESSOR) 25 MG tablet Take 50 mg by mouth daily.  09/19/15  Yes Historical Provider, MD  metroNIDAZOLE (FLAGYL) 500 MG tablet Take 1 tablet (500 mg total) by mouth 3 (three) times daily. One po bid x 7 days 11/27/15  Yes [name], MD  naproxen sodium (ANAPROX) 220 MG tablet Take 220 mg by mouth every 6 (six) hours as needed (  for pain.).   Yes Historical Provider, MD  ondansetron (ZOFRAN) 4 MG tablet Take 1 tablet (4 mg total) by mouth every 8 (eight) hours as needed for nausea or vomiting. 11/27/15  Yes [name], MD  pantoprazole (PROTONIX) 40 MG tablet Take 40 mg by mouth daily. 10/27/15  Yes Historical Provider, MD  predniSONE (DELTASONE) 10 MG tablet Take 4 tablets (40 mg total) by mouth daily with breakfast. 11/27/15  Yes [name], MD  promethazine (PHENERGAN) 25 MG tablet Take 1 tablet (25 mg total) by mouth every 8 (eight) hours as needed for nausea or vomiting. 11/21/15  Yes Orlie Dakin, MD  Na Sulfate-K Sulfate-Mg Sulf SOLN Take 1 kit by mouth  once. Patient not taking: Reported on 12/01/2015 11/27/15   [name], MD   BP 172/98 mmHg  Pulse 66  Temp(Src) 98.4 F (36.9 C) (Oral)  Resp 16  SpO2 100%  LMP 11/06/2015   Physical Exam  Constitutional: She is oriented to person, place, and time. She appears well-developed and well-nourished.  HENT:  Head: Normocephalic and atraumatic.  Eyes: Conjunctivae are normal. Pupils are equal, round, and reactive to light. Right eye exhibits no discharge. Left eye exhibits no discharge. No scleral icterus.  Neck: Normal range of motion. No JVD present. No tracheal deviation present.  Pulmonary/Chest: Effort normal. No stridor.  Abdominal: She exhibits no distension and no mass. There is tenderness. There is no rebound and no guarding.  Diffuse tenderness, no focal findings, nonsurgical  Musculoskeletal: Normal range of motion. She exhibits no edema or tenderness.  Neurological: She is alert and oriented to person, place, and time. Coordination normal.  Skin: Skin is warm and dry. No rash noted. No erythema. No pallor.  Psychiatric: She has a normal mood and affect. Her behavior is normal. Judgment and thought content normal.  Nursing note and vitals reviewed.   ED Course  Procedures (including critical care time) Labs Review Labs Reviewed  COMPREHENSIVE METABOLIC PANEL - Abnormal; Notable for the following:    Potassium 2.9 (*)    Chloride 98 (*)    Albumin 2.9 (*)    AST 12 (*)    ALT 9 (*)    All other components within normal limits  CBC - Abnormal; Notable for the following:    WBC 13.6 (*)    Hemoglobin 10.3 (*)    HCT 33.2 (*)    MCV 74.8 (*)    MCH 23.2 (*)    RDW 17.3 (*)    Platelets 531 (*)    All other components within normal limits  URINALYSIS, ROUTINE W REFLEX MICROSCOPIC (NOT AT [facility]) - Abnormal; Notable for the following:    APPearance CLOUDY (*)    Hgb urine dipstick MODERATE (*)    Leukocytes, UA MODERATE (*)    All other components within normal  limits  URINE MICROSCOPIC-ADD ON - Abnormal; Notable for the following:    Squamous Epithelial / LPF 0-5 (*)    Bacteria, UA RARE (*)    All other components within normal limits  LIPASE, BLOOD  PREGNANCY, URINE  MAGNESIUM    Imaging Review Dg Abd Acute W/chest  12/01/2015  CLINICAL DATA:  50 year old female with vomiting and generalized abdominal pain EXAM: DG ABDOMEN ACUTE W/ 1V CHEST COMPARISON:  Abdominal CT dated 11/20/2015 FINDINGS: There is no evidence of dilated bowel loops or free intraperitoneal air. No radiopaque calculi or other significant radiographic abnormality is seen. Heart size and mediastinal contours  are within normal limits. Both lungs are clear. IMPRESSION: Negative abdominal radiographs.  No acute cardiopulmonary disease. Electronically Signed   By: Anner Crete M.D.   On: 12/01/2015 18:04   I have personally reviewed and evaluated these images and lab results as part of my medical decision-making.   EKG Interpretation None      MDM   Final diagnoses:  UTI (lower urinary tract infection)    Labs: Lipase, CMP, CBC, Urinalysis- moderate leukocytes, potassium 2.9, WBC 13.6  Imaging:   Consults: Gastroenterology, [facility]  [employer]: Potassium, Cipro, metronidazole  Discharge Meds:   Assessment/Plan: 57 -year-old female presents today with chronic disease. Patient has been seen in the emergency room prior for this, followed up with gastroenterology. I spoke with Dr. Loletha Carrow of gastroenterology who donated hospital admission is last visit, attempts at outpatient management. Patient is here today because she is unable to tolerate her antibody except home, continued abdominal pain, and nausea and vomiting. Patient has potassium of 2.6 here, continued pain. Dr. Loletha Carrow recommended that patient be admitted to the hospital, bowel prep overnight, dose of Cipro and metronidazole, and colonoscopy tomorrow.        Okey Regal, PA-C 12/01/15 1819  Merrily Pew, MD 12/02/15 [phone]

## 2015-12-01 NOTE — ED Notes (Signed)
Pt reports abdominal pain, recently diagnosed with Chron's,has a colonoscopy scheduled for tomorrow, but called her dr due to pain, and was told to come to ED

## 2015-12-01 NOTE — Telephone Encounter (Signed)
Pt went to [facility] ER.

## 2015-12-01 NOTE — Progress Notes (Signed)
I spoke with the ED provider.  I know this patient from an office consult last week.  I suspect she has severe Crohn's disease.  Thanks to medicine service for accepting her admission for vomiting and hypokalemia. I would like her to have IV ciprofloxacin and metronidazole in case there is any infection in the abdominal fluid.   I would also like her to take a bowel preparation tonight, as I already have her on my schedule for a colonoscopy tomorrow.

## 2015-12-01 NOTE — H&P (Signed)
History and Physical    Marissa Blankenship OMB:[id number] DOB: [birth year] DOA: 12/01/2015  PCP: Kerin Perna, NP  Patient coming from: Home  Chief Complaint: Generalized abdominal pain  HPI: Marissa Blankenship is a 50 y.o. female with medical history significant of recently diagnosed Crohn's disease, patient reported that she had a long history of bowel symptoms, and she was recently diagnosed with Crohn's. She was presented to the ED on the 5/5 and diagnosed with UTI, she was discharged on Cipro and Flagyl, she reported that since Saturday she quit taking medications because she couldn't tolerate them because of extreme nausea and vomiting. Last time she vomited was Saturday, but she acknowledged nausea and poor oral intake. She denies diarrhea, denies weight loss. ED Course: NAD potassium is 2.9, WBC 13.6, urinalysis again notable for TNTC WBC and rare bacteriuria. CT scan from 5/5 showed abnormal terminal ileum with wall thickening consistent with Crohn's disease. No colonic involvement.  Review of Systems: All others reviewed and negative  Past Medical History  Diagnosis Date  . Hypertension   . Acid reflux   . Shortness of breath   . Anemia   . IBS (irritable bowel syndrome)     spastic colon    Past Surgical History  Procedure Laterality Date  . Cesarean section    . Wisdom tooth extraction       reports that she quit smoking about 18 years ago. She has never used smokeless tobacco. She reports that she does not drink alcohol or use illicit drugs.  No Known Allergies  Family History  Problem Relation Age of Onset  . Hypertension Father 32  . Hypertension Mother 81  . Crohn's disease Sister   . Diabetes Paternal Grandmother   . Hypertension Mother   . Hypertension Father   . Hypertension Brother    Prior to Admission medications   Medication Sig Start Date End Date Taking? Authorizing Provider  ciprofloxacin (CIPRO) 500 MG tablet Take 1 tablet (500 mg total) by mouth  every 12 (twelve) hours. 11/27/15  Yes [name], MD  dicyclomine (BENTYL) 20 MG tablet Take 20 mg by mouth 3 (three) times daily as needed (for abdominal pain.).  09/14/15  Yes Historical Provider, MD  HYDROcodone-acetaminophen (NORCO) 5-325 MG tablet Take 1-2 tablets by mouth every 6 (six) hours as needed for moderate pain or severe pain. 11/27/15  Yes [name], MD  lisinopril (PRINIVIL,ZESTRIL) 20 MG tablet Take 20 mg by mouth every morning. 10/07/15  Yes Historical Provider, MD  metoprolol tartrate (LOPRESSOR) 25 MG tablet Take 50 mg by mouth daily.  09/19/15  Yes Historical Provider, MD  metroNIDAZOLE (FLAGYL) 500 MG tablet Take 1 tablet (500 mg total) by mouth 3 (three) times daily. One po bid x 7 days 11/27/15  Yes [name], MD  naproxen sodium (ANAPROX) 220 MG tablet Take 220 mg by mouth every 6 (six) hours as needed (for pain.).   Yes Historical Provider, MD  ondansetron (ZOFRAN) 4 MG tablet Take 1 tablet (4 mg total) by mouth every 8 (eight) hours as needed for nausea or vomiting. 11/27/15  Yes [name], MD  pantoprazole (PROTONIX) 40 MG tablet Take 40 mg by mouth daily. 10/27/15  Yes Historical Provider, MD  predniSONE (DELTASONE) 10 MG tablet Take 4 tablets (40 mg total) by mouth daily with breakfast. 11/27/15  Yes [name], MD  promethazine (PHENERGAN) 25 MG tablet Take 1 tablet (25 mg total) by mouth  every 8 (eight) hours as needed for nausea or vomiting. 11/21/15  Yes Orlie Dakin, MD  Na Sulfate-K Sulfate-Mg Sulf SOLN Take 1 kit by mouth once. Patient not taking: Reported on 12/01/2015 11/27/15   [name], MD    Physical Exam: Filed Vitals:   12/01/15 1312 12/01/15 1752  BP: 167/99 172/98  Pulse: 66 66  Temp: 98.4 F (36.9 C)   TempSrc: Oral   Resp: 14 16  SpO2: 100% 100%  Constitutional: NAD, calm, comfortable Filed Vitals:   12/01/15 1312 12/01/15 1752  BP: 167/99 172/98  Pulse: 66 66  Temp: 98.4 F (36.9 C)   TempSrc: Oral     Resp: 14 16  SpO2: 100% 100%   Eyes: PERRL, lids and conjunctivae normal ENMT: Mucous membranes are moist. Posterior pharynx clear of any exudate or lesions.Normal dentition.  Neck: normal, supple, no masses, no thyromegaly Respiratory: clear to auscultation bilaterally, no wheezing, no crackles. Normal respiratory effort. No accessory muscle use.  Cardiovascular: Regular rate and rhythm, no murmurs / rubs / gallops. No extremity edema. 2+ pedal pulses. No carotid bruits.  Abdomen: no tenderness, no masses palpated. No hepatosplenomegaly. Bowel sounds positive.  Musculoskeletal: no clubbing / cyanosis. No joint deformity upper and lower extremities. Good ROM, no contractures. Normal muscle tone.  Skin: no rashes, lesions, ulcers. No induration Neurologic: CN 2-12 grossly intact. Sensation intact, DTR normal. Strength 5/5 in all 4.  Psychiatric: Normal judgment and insight. Alert and oriented x 3. Normal mood.   Labs on Admission: I have personally reviewed following labs and imaging studies  CBC:  Recent Labs Lab 12/01/15 1415  WBC 13.6*  HGB 10.3*  HCT 33.2*  MCV 74.8*  PLT 086*   Basic Metabolic Panel:  Recent Labs Lab 12/01/15 1415  NA 138  K 2.9*  CL 98*  CO2 31  GLUCOSE 89  BUN 9  CREATININE 0.71  CALCIUM 9.0   GFR: Estimated Creatinine Clearance: 82.2 mL/min (by C-G formula based on Cr of 0.71). Liver Function Tests:  Recent Labs Lab 12/01/15 1415  AST 12*  ALT 9*  ALKPHOS 60  BILITOT 0.4  PROT 7.1  ALBUMIN 2.9*    Recent Labs Lab 12/01/15 1415  LIPASE 12   No results for input(s): AMMONIA in the last 168 hours. Coagulation Profile: No results for input(s): INR, PROTIME in the last 168 hours. Cardiac Enzymes: No results for input(s): CKTOTAL, CKMB, CKMBINDEX, TROPONINI in the last 168 hours. BNP (last 3 results) No results for input(s): PROBNP in the last 8760 hours. HbA1C: No results for input(s): HGBA1C in the last 72 hours. CBG: No  results for input(s): GLUCAP in the last 168 hours. Lipid Profile: No results for input(s): CHOL, HDL, LDLCALC, TRIG, CHOLHDL, LDLDIRECT in the last 72 hours. Thyroid Function Tests: No results for input(s): TSH, T4TOTAL, FREET4, T3FREE, THYROIDAB in the last 72 hours. Anemia Panel: No results for input(s): VITAMINB12, FOLATE, FERRITIN, TIBC, IRON, RETICCTPCT in the last 72 hours. Urine analysis:    Component Value Date/Time   COLORURINE YELLOW 12/01/2015 1511   APPEARANCEUR CLOUDY* 12/01/2015 1511   LABSPEC 1.012 12/01/2015 1511   PHURINE 7.5 12/01/2015 1511   GLUCOSEU NEGATIVE 12/01/2015 1511   HGBUR MODERATE* 12/01/2015 1511   HGBUR negative 05/25/2010 1611   BILIRUBINUR NEGATIVE 12/01/2015 1511   [place] 12/01/2015 1511   PROTEINUR NEGATIVE 12/01/2015 1511   UROBILINOGEN 0.2 03/24/2011 1330   NITRITE NEGATIVE 12/01/2015 1511   LEUKOCYTESUR MODERATE* 12/01/2015 1511  Sepsis Labs: !!!!!!!!!!!!!!!!!!!!!!!!!!!!!!!!!!!!!!!!!!!! _0 (procalcitonin:4,lacticidven:4) )No results found for this or any previous visit (from the past 240 hour(s)).   Radiological Exams on Admission: Dg Abd Acute W/chest  12/01/2015  CLINICAL DATA:  49 year old female with vomiting and generalized abdominal pain EXAM: DG ABDOMEN ACUTE W/ 1V CHEST COMPARISON:  Abdominal CT dated 11/20/2015 FINDINGS: There is no evidence of dilated bowel loops or free intraperitoneal air. No radiopaque calculi or other significant radiographic abnormality is seen. Heart size and mediastinal contours are within normal limits. Both lungs are clear. IMPRESSION: Negative abdominal radiographs.  No acute cardiopulmonary disease. Electronically Signed   By: Anner Crete M.D.   On: 12/01/2015 18:04    EKG: Independently reviewed.   Assessment/Plan Principal Problem:   Crohn's disease ([place]) Active Problems:   UTI (lower urinary tract infection)   Hypokalemia   Inadequate oral intake   Crohn's disease  exacerbation Patient reported abdominal pain, nausea, vomiting and inadequate oral intake. GI whereby the admission, scheduled for colonoscopy in the morning. Her CT scan had involvement of the distal and terminal ileum no colonic involvement. GI recommended Cipro and Flagyl, steroids added.  UTI Urinalysis consistent with UTI, this is third UTI since March 2017. Last culture showed pansensitive Escherichia coli, she is on Cipro. Last CT scan did not suggest any presence of fistulas, she did not acknowledge any pneumaturia or change in urine color/smell.  Hypokalemia Potassium is 2.9 on admission, replete with oral supplements. Check magnesium and check BMP in a.m.  Leukocytosis Likely secondary to the UTI.  Inadequate oral intake Secondary to the Crohn's disease exacerbation (has abdominal pain and nausea). Started on IV fluids.   DVT prophylaxis: Subcutaneous heparin Code Status: Full code Family Communication: Plan discussed with the patient  Disposition Plan: Likely home on discharge Consults called: GI aware, colonoscopy scheduled for tomorrow Admission status: MedSurg, inpatient   [facility] A MD [facility] Pager [phone]  If 7PM-7AM, please contact night-coverage [email or web address] Password [facility]  12/01/2015, 6:27 PM

## 2015-12-02 ENCOUNTER — Ambulatory Visit (HOSPITAL_COMMUNITY): Admission: RE | Admit: 2015-12-02 | Payer: Medicaid Other | Source: Ambulatory Visit | Admitting: Gastroenterology

## 2015-12-02 ENCOUNTER — Inpatient Hospital Stay (HOSPITAL_COMMUNITY): Payer: Medicaid Other | Admitting: Certified Registered Nurse Anesthetist

## 2015-12-02 ENCOUNTER — Encounter (HOSPITAL_COMMUNITY): Payer: Self-pay

## 2015-12-02 ENCOUNTER — Encounter (HOSPITAL_COMMUNITY)
Admission: EM | Disposition: A | Discharge status: Home / Self Care | Payer: Self-pay | Source: Home / Self Care | Attending: Internal Medicine

## 2015-12-02 DIAGNOSIS — E876 Hypokalemia: Secondary | ICD-10-CM

## 2015-12-02 DIAGNOSIS — K5000 Crohn's disease of small intestine without complications: Secondary | ICD-10-CM

## 2015-12-02 DIAGNOSIS — R197 Diarrhea, unspecified: Secondary | ICD-10-CM

## 2015-12-02 DIAGNOSIS — E43 Unspecified severe protein-calorie malnutrition: Secondary | ICD-10-CM | POA: Insufficient documentation

## 2015-12-02 DIAGNOSIS — N390 Urinary tract infection, site not specified: Secondary | ICD-10-CM

## 2015-12-02 DIAGNOSIS — I10 Essential (primary) hypertension: Secondary | ICD-10-CM

## 2015-12-02 DIAGNOSIS — K219 Gastro-esophageal reflux disease without esophagitis: Secondary | ICD-10-CM

## 2015-12-02 HISTORY — PX: COLONOSCOPY WITH PROPOFOL: SHX5780

## 2015-12-02 LAB — BASIC METABOLIC PANEL
Anion gap: 8 (ref 5–15)
CO2: 28 mmol/L (ref 22–32)
CREATININE: 0.68 mg/dL (ref 0.44–1.00)
Calcium: 9.1 mg/dL (ref 8.9–10.3)
Chloride: 101 mmol/L (ref 101–111)
GFR calc Af Amer: >60 mL/min (ref >60)
GLUCOSE: 132 mg/dL — AB (ref 65–99)
Potassium: 4.6 mmol/L (ref 3.5–5.1)
SODIUM: 137 mmol/L (ref 135–145)

## 2015-12-02 LAB — CBC
HEMATOCRIT: 34.1 % — AB (ref 36.0–46.0)
Hemoglobin: 10.4 g/dL — ABNORMAL LOW (ref 12.0–15.0)
MCH: 23 pg — ABNORMAL LOW (ref 26.0–34.0)
MCHC: 30.5 g/dL (ref 30.0–36.0)
MCV: 75.3 fL — ABNORMAL LOW (ref 78.0–100.0)
PLATELETS: 589 10*3/uL — AB (ref 150–400)
RBC: 4.53 MIL/uL (ref 3.87–5.11)
RDW: 17.6 % — AB (ref 11.5–15.5)
WBC: 11.5 10*3/uL — AB (ref 4.0–10.5)

## 2015-12-02 SURGERY — COLONOSCOPY WITH PROPOFOL
Anesthesia: Monitor Anesthesia Care

## 2015-12-02 MED ORDER — DEXTROSE-NACL 5-0.9 % IV SOLN: INTRAVENOUS | Status: DC

## 2015-12-02 MED ORDER — PROPOFOL 500 MG/50ML IV EMUL
INTRAVENOUS | Status: DC | PRN
  Administered 2015-12-02: 100 ug/kg/min via INTRAVENOUS

## 2015-12-02 MED ORDER — HYDRALAZINE HCL 20 MG/ML IJ SOLN
10.0000 mg | Freq: Four times a day (QID) | INTRAMUSCULAR | Status: DC | PRN
  Filled 2015-12-02: qty 1

## 2015-12-02 MED ORDER — PROPOFOL 500 MG/50ML IV EMUL
INTRAVENOUS | Status: DC | PRN
  Administered 2015-12-02: 60 mg via INTRAVENOUS

## 2015-12-02 MED ORDER — METHYLPREDNISOLONE SODIUM SUCC 40 MG IJ SOLR
40.0000 mg | INTRAMUSCULAR | Status: DC
  Administered 2015-12-03: 40 mg via INTRAVENOUS
  Filled 2015-12-02: qty 1

## 2015-12-02 MED ORDER — SODIUM CHLORIDE 0.9 % IV SOLN: INTRAVENOUS | Status: DC

## 2015-12-02 MED ORDER — DEXTROSE-NACL 5-0.9 % IV SOLN
INTRAVENOUS | Status: DC
  Administered 2015-12-02: 1000 mL via INTRAVENOUS

## 2015-12-02 MED ORDER — LISINOPRIL 20 MG PO TABS
20.0000 mg | ORAL_TABLET | Freq: Every morning | ORAL | Status: DC
  Administered 2015-12-02: 20 mg via ORAL
  Filled 2015-12-02: qty 1

## 2015-12-02 MED ORDER — LACTATED RINGERS IV SOLN
INTRAVENOUS | Status: DC | PRN
  Administered 2015-12-02: 13:00:00 via INTRAVENOUS

## 2015-12-02 SURGICAL SUPPLY — 21 items

## 2015-12-02 NOTE — Progress Notes (Signed)
PROGRESS NOTE    Marissa Blankenship  IHK:[id number] DOB: 09-19-1965 DOA: 12/01/2015 PCP: Kerin Perna, NP    Brief Narrative:  Marissa Blankenship is a 50 y.o. female with medical history significant of recently diagnosed Crohn's disease, patient reported that she had a long history of bowel symptoms, and she was recently diagnosed with Crohn's. She was presented to the ED on the 5/5 and diagnosed with UTI, she was discharged on Cipro and Flagyl, she reported that since Saturday she quit taking medications because she couldn't tolerate them because of extreme nausea and vomiting. Last time she vomited was Saturday, but she acknowledged nausea and poor oral intake. She denies diarrhea, denies weight loss. ED Course: NAD potassium is 2.9, WBC 13.6, urinalysis again notable for TNTC WBC and rare bacteriuria. CT scan from 5/5 showed abnormal terminal ileum with wall thickening consistent with Crohn's disease. No colonic involvement.  Assessment & Plan:   Principal Problem:   Crohn's disease ([place]) Active Problems:   Essential hypertension   ACID REFLUX DISEASE   UTI (lower urinary tract infection)   Hypokalemia   Inadequate oral intake  #1 Crohn's disease flare Patient presenting with abdominal pain, nausea vomiting and poor oral intake. CT of the abdomen and pelvis done on 11/20/2015 with grossly abnormal loops of distal and terminal ileum with wall thickening and mucosal enhancement suggestive of Crohn's disease. Clearly reactive free fluid in the pelvis. Possible sacroiliitis. Patient with clinical improvement. Continue empiric IV ciprofloxacin, IV Flagyl, IV steroids. Patient for colonoscopy today. Per GI.  #2 UTI Check urine cultures. Continue IV ciprofloxacin.  #3 hypokalemia Likely secondary to GI losses. Repleted. Follow.  #4 gastroesophageal reflux disease PPI.  #5 hypertension Continue Lopressor. Resume home regimen of lisinopril.   DVT prophylaxis: SCDs Code Status: Full Family  Communication: Updated patient. No family at bedside. Disposition Plan: Pending GI evaluation and further workup of her Crohn's flare..   Consultants:   Gastroenterology:Dr Danis III 12/01/2015  Procedures:   Acute abdominal series 12/01/2015    Antimicrobials:   IV ciprofloxacin 12/01/2015  IV Flagyl 12/01/2015   Subjective: Patient denies any further nausea, or emesis. Abdominal pain improved. No shortness of breath. No chest pain.  Objective: Filed Vitals:   12/01/15 1850 12/01/15 2142 12/02/15 0645 12/02/15 0937  BP: 169/103 149/94 135/80 176/106  Pulse:  59 54 57  Temp:  98 F (36.7 C) 98 F (36.7 C)   TempSrc:  Oral Oral   Resp:  16 16   Height:      Weight:      SpO2:  100% 100%     Intake/Output Summary (Last 24 hours) at 12/02/15 1116 Last data filed at 12/02/15 0624  Gross per 24 hour  Intake 1291.67 ml  Output      0 ml  Net 1291.67 ml   Filed Weights   12/01/15 1752  Weight: 68.176 kg (150 lb 4.8 oz)    Examination:  General exam: Appears calm and comfortable  Respiratory system: Clear to auscultation. Respiratory effort normal. Cardiovascular system: S1 & S2 heard, RRR. No JVD, murmurs, rubs, gallops or clicks. No pedal edema. Gastrointestinal system: Abdomen is nondistended, soft and nontender. No organomegaly or masses felt. Normal bowel sounds heard. Central nervous system: Alert and oriented. No focal neurological deficits. Extremities: Symmetric 5 x 5 power. Skin: No rashes, lesions or ulcers Psychiatry: Judgement and insight appear normal. Mood & affect appropriate.     Data Reviewed: I have personally reviewed following labs and imaging  studies  CBC:  Recent Labs Lab 12/01/15 1415 12/02/15 0343  WBC 13.6* 11.5*  HGB 10.3* 10.4*  HCT 33.2* 34.1*  MCV 74.8* 75.3*  PLT 531* 102*   Basic Metabolic Panel:  Recent Labs Lab 12/01/15 1410 12/01/15 1415 12/02/15 0343  NA  --  138 137  K  --  2.9* 4.6  CL  --  98* 101    CO2  --  31 28  GLUCOSE  --  89 132*  BUN  --  9 <5*  CREATININE  --  0.71 0.68  CALCIUM  --  9.0 9.1  MG 1.9  --   --    GFR: Estimated Creatinine Clearance: 82.5 mL/min (by C-G formula based on Cr of 0.68). Liver Function Tests:  Recent Labs Lab 12/01/15 1415  AST 12*  ALT 9*  ALKPHOS 60  BILITOT 0.4  PROT 7.1  ALBUMIN 2.9*    Recent Labs Lab 12/01/15 1415  LIPASE 12   No results for input(s): AMMONIA in the last 168 hours. Coagulation Profile:  Recent Labs Lab 12/01/15 1938  INR 1.05   Cardiac Enzymes: No results for input(s): CKTOTAL, CKMB, CKMBINDEX, TROPONINI in the last 168 hours. BNP (last 3 results) No results for input(s): PROBNP in the last 8760 hours. HbA1C: No results for input(s): HGBA1C in the last 72 hours. CBG: No results for input(s): GLUCAP in the last 168 hours. Lipid Profile: No results for input(s): CHOL, HDL, LDLCALC, TRIG, CHOLHDL, LDLDIRECT in the last 72 hours. Thyroid Function Tests:  Recent Labs  12/01/15 1938  TSH 1.839   Anemia Panel: No results for input(s): VITAMINB12, FOLATE, FERRITIN, TIBC, IRON, RETICCTPCT in the last 72 hours. Sepsis Labs: No results for input(s): PROCALCITON, LATICACIDVEN in the last 168 hours.  No results found for this or any previous visit (from the past 240 hour(s)).       Radiology Studies: Dg Abd Acute W/chest  12/01/2015  CLINICAL DATA:  50 year old female with vomiting and generalized abdominal pain EXAM: DG ABDOMEN ACUTE W/ 1V CHEST COMPARISON:  Abdominal CT dated 11/20/2015 FINDINGS: There is no evidence of dilated bowel loops or free intraperitoneal air. No radiopaque calculi or other significant radiographic abnormality is seen. Heart size and mediastinal contours are within normal limits. Both lungs are clear. IMPRESSION: Negative abdominal radiographs.  No acute cardiopulmonary disease. Electronically Signed   By: Anner Crete M.D.   On: 12/01/2015 18:04        Scheduled  Meds: . ciprofloxacin  400 mg Intravenous Q12H  . lisinopril  20 mg Oral q morning - 10a  . [START ON 12/03/2015] methylPREDNISolone (SOLU-MEDROL) injection  40 mg Intravenous Q24H  . metoCLOPramide (REGLAN) injection  10 mg Intravenous Q6H  . metoprolol tartrate  50 mg Oral Daily  . metronidazole  500 mg Intravenous Q6H  . pantoprazole  40 mg Oral Daily   Continuous Infusions: . sodium chloride    . dextrose 5 % and 0.9% NaCl 1,000 mL (12/02/15 0821)     LOS: 1 day    Time spent: 47 mins    Marissa Rolin, MD [facility] Pager [phone]  If 7PM-7AM, please contact night-coverage [email or web address] Password [email or web address] 12/02/2015, 11:16 AM

## 2015-12-02 NOTE — Anesthesia Postprocedure Evaluation (Signed)
Anesthesia Post Note  Patient: Marissa Blankenship  Procedure(s) Performed: Procedure(s) (LRB): COLONOSCOPY WITH PROPOFOL (N/A)  Patient location during evaluation: Endoscopy Anesthesia Type: MAC Level of consciousness: awake and alert Pain management: pain level controlled Vital Signs Assessment: post-procedure vital signs reviewed and stable Respiratory status: spontaneous breathing, nonlabored ventilation, respiratory function stable and patient connected to nasal cannula oxygen Cardiovascular status: stable and blood pressure returned to baseline Anesthetic complications: no    Last Vitals:  Filed Vitals:   12/02/15 1348 12/02/15 1404  BP: 181/88 153/85  Pulse:  55  Temp:  36.5 C  Resp:  17    Last Pain:  Filed Vitals:   12/02/15 1405  PainSc: 0-No pain                 Catalina Gravel

## 2015-12-02 NOTE — Progress Notes (Signed)
Pharmacy Antibiotic Note  Marissa Blankenship is a 50 y.o. female admitted on 12/01/2015 with Crohn's/UTI.  Pharmacy has been consulted for Ciprofloxacin dosing.  Plan: Ciprofloxacin 466m iv q12hr  Height: 5' 4"  (162.6 cm) Weight: 150 lb 4.8 oz (68.176 kg) IBW/kg (Calculated) : 54.7  Temp (24hrs), Avg:98.1 F (36.7 C), Min:97.9 F (36.6 C), Max:98.4 F (36.9 C)   Recent Labs Lab 12/01/15 1415 12/02/15 0343  WBC 13.6* 11.5*  CREATININE 0.71 0.68    Estimated Creatinine Clearance: 82.5 mL/min (by C-G formula based on Cr of 0.68).    No Known Allergies  Antimicrobials this admission: Flagyl 5/16 >> Cipro 5/16 >>  Dose adjustments this admission: -  Microbiology results: pending  Thank you for allowing pharmacy to be a part of this patient's care.  GNani SkillernCrowford 12/02/2015 6:03 AM

## 2015-12-02 NOTE — Progress Notes (Signed)
Pharmacy Antibiotic Note  Marissa Blankenship is a 50 y.o. female admitted on 12/01/2015 with Crohn's/UTI.  Pharmacy has been consulted for Ciprofloxacin dosing. MD is dosing Flagyl  Plan: Ciprofloxacin 466m iv q12hr Flagyl 5068mIV q6h per MD Renal function is stable, no further dosing adjustments anticipated. Pharmacy will sign-off, please re-consult if clinical situation warrants  Height: 5' 4"  (162.6 cm) Weight: 150 lb 4.8 oz (68.176 kg) IBW/kg (Calculated) : 54.7  Temp (24hrs), Avg:98.1 F (36.7 C), Min:97.9 F (36.6 C), Max:98.4 F (36.9 C)   Recent Labs Lab 12/01/15 1415 12/02/15 0343  WBC 13.6* 11.5*  CREATININE 0.71 0.68    Estimated Creatinine Clearance: 82.5 mL/min (by C-G formula based on Cr of 0.68).    No Known Allergies  Antimicrobials this admission: Flagyl 5/16 >> Cipro 5/16 >>  Dose adjustments this admission: -  Microbiology results: None ordered  Thank you for allowing pharmacy to be a part of this patient's care.  ErPeggyann JubaPharmD, BCPS Pager: [phone]/17/2017 8:02 AM

## 2015-12-02 NOTE — Progress Notes (Signed)
Patient back to room, s/p colonoscopy, vitals obtained and recorded. Patient denies pain. Will continue to monitor.

## 2015-12-02 NOTE — Op Note (Signed)
[facility] Patient Name: Marissa Blankenship Procedure Date: 12/02/2015 MRN: [id number] Attending MD: Estill Cotta. Loletha Carrow , MD Date of Birth: Jun 01, 1966 CSN: [id number] Age: 50 Admit Type: Inpatient Procedure:                Colonoscopy Indications:              Abdominal pain in the right lower quadrant, Chronic                            diarrhea, Abnormal CT of the GI tract Providers:                Mallie Mussel L. Loletha Carrow, MD, Zenon Mayo, RN, Dustin Flock,                            RN, Ralene Bathe, Technician, Herbie Drape, CRNA Referring MD:              Medicines:                Monitored Anesthesia Care Complications:            No immediate complications. Estimated Blood Loss:     Estimated blood loss was minimal. Procedure:                Pre-Anesthesia Assessment:                           - Prior to the procedure, a History and Physical                            was performed, and patient medications and                            allergies were reviewed. The patient's tolerance of                            previous anesthesia was also reviewed. The risks                            and benefits of the procedure and the sedation                            options and risks were discussed with the patient.                            All questions were answered, and informed consent                            was obtained. Prior Anticoagulants: The patient has                            taken no previous anticoagulant or antiplatelet                            agents. ASA Grade Assessment: II - A patient with  mild systemic disease. After reviewing the risks                            and benefits, the patient was deemed in                            satisfactory condition to undergo the procedure.                           After obtaining informed consent, the colonoscope                            was passed under direct vision. Throughout the                          procedure, the patient's blood pressure, pulse, and                            oxygen saturations were monitored continuously. The                            EC-[id number] ([id number]) scope was introduced through                            the anus and advanced to the the cecum, identified                            by the ileocecal valve. The colonoscopy was                            performed without difficulty. The patient tolerated                            the procedure well. The quality of the bowel                            preparation was good. The ileocecal valve and the                            cecal strap were photographed. Scope In: 1:00:42 PM Scope Out: 1:21:14 PM Scope Withdrawal Time: 0 hours 15 minutes 16 seconds  Total Procedure Duration: 0 hours 20 minutes 32 seconds  Findings:      The perianal and digital rectal examinations were normal. Pertinent       negatives include no fistula.      Severe inflammation characterized by congestion (edema), friability and       shallow ulcerations was found at the ileocecal valve. This had the       appearance of Crohn's disease. Biopsies were taken with a cold forceps       for histology. the ileum could not be intubated due to the degree of       inflammation and anatomic distortion of the ICV. On the ICV there was       also a small pocket with surrounding finger-like projections that was  most likely a fistula.      Retroflexion in the rectum was not performed due to anatomy.      The exam was otherwise without abnormality. Impression:               - Severe inflammation was found at the ileocecal                            valve secondary to Crohn's disease. Biopsied.                           - The examination was otherwise normal. Moderate Sedation:      MAC sedation used Recommendation:           - Continue present medications, including                            solumedrol and antibiotics.                            - Await pathology results.                           - Resume regular diet.                           - No recommendation at this time regarding repeat                            colonoscopy due to pending patient's clinical                            response. Procedure Code(s):        --- Professional ---                           [id number], Colonoscopy, flexible; with biopsy, single                            or multiple Diagnosis Code(s):        --- Professional ---                           K50.90, Crohn's disease, unspecified, without                            complications                           R10.31, Right lower quadrant pain                           K52.9, Noninfective gastroenteritis and colitis,                            unspecified                           R93.3, Abnormal findings on diagnostic imaging of  other parts of digestive tract CPT copyright 2016 [employer]. All rights reserved. The codes documented in this report are preliminary and upon coder review may  be revised to meet current compliance requirements. Mariano Doshi L. Loletha Carrow, MD 12/02/2015 1:33:02 PM This report has been signed electronically. Number of Addenda: 0

## 2015-12-02 NOTE — Transfer of Care (Signed)
Immediate Anesthesia Transfer of Care Note  Patient: Marissa Blankenship  Procedure(s) Performed: Procedure(s): COLONOSCOPY WITH PROPOFOL (N/A)  Patient Location: PACU  Anesthesia Type:MAC  Level of Consciousness: sedated, patient cooperative and responds to stimulation  Airway & Oxygen Therapy: Patient Spontanous Breathing and Patient connected to face mask oxygen  Post-op Assessment: Report given to RN and Post -op Vital signs reviewed and stable  Post vital signs: Reviewed and stable  Last Vitals:  Filed Vitals:   12/02/15 1124 12/02/15 1233  BP: 160/99 189/104  Pulse:  51  Temp:  36.7 C  Resp:  11    Last Pain:  Filed Vitals:   12/02/15 1234  PainSc: 0-No pain      Patients Stated Pain Goal: 2 (32/34/68 8737)  Complications: No apparent anesthesia complications

## 2015-12-02 NOTE — Interval H&P Note (Signed)
History and Physical Interval Note:  12/02/2015 12:58 PM  Marissa Blankenship  has presented today for surgery, with the diagnosis of abd.pain  The various methods of treatment have been discussed with the patient and family. After consideration of risks, benefits and other options for treatment, the patient has consented to  Procedure(s): COLONOSCOPY WITH PROPOFOL (N/A) as a surgical intervention .  The patient's history has been reviewed, patient examined, no change in status, stable for surgery.  I have reviewed the patient's chart and labs.  Questions were answered to the patient's satisfaction.     [name]

## 2015-12-02 NOTE — Anesthesia Preprocedure Evaluation (Signed)
Anesthesia Evaluation  Patient identified by MRN, date of birth, ID band Patient awake    Reviewed: Allergy & Precautions, NPO status , Patient's Chart, lab work & pertinent test results, reviewed documented beta blocker date and time   Airway Mallampati: II  TM Distance: >3 FB Neck ROM: Full    Dental  (+) Teeth Intact, Dental Advisory Given, Chipped,    Pulmonary former smoker,    Pulmonary exam normal breath sounds clear to auscultation       Cardiovascular hypertension, Pt. on medications and Pt. on home beta blockers Normal cardiovascular exam Rhythm:Regular Rate:Normal     Neuro/Psych negative neurological ROS  negative psych ROS   GI/Hepatic Neg liver ROS, GERD  Medicated,Crohns disease   Endo/Other  negative endocrine ROS  Renal/GU negative Renal ROS     Musculoskeletal negative musculoskeletal ROS (+)   Abdominal   Peds  Hematology  (+) Blood dyscrasia, anemia ,   Anesthesia Other Findings Day of surgery medications reviewed with the patient.  Reproductive/Obstetrics negative OB ROS                             Anesthesia Physical Anesthesia Plan  ASA: II  Anesthesia Plan: MAC   Post-op Pain Management:    Induction: Intravenous  Airway Management Planned: Nasal Cannula  Additional Equipment:   Intra-op Plan:   Post-operative Plan:   Informed Consent: I have reviewed the patients History and Physical, chart, labs and discussed the procedure including the risks, benefits and alternatives for the proposed anesthesia with the patient or authorized representative who has indicated his/her understanding and acceptance.   Dental advisory given  Plan Discussed with: CRNA and Anesthesiologist  Anesthesia Plan Comments: (Discussed risks/benefits/alternatives to MAC sedation including need for ventilatory support, hypotension, need for conversion to general anesthesia.  All  patient questions answered.  Patient/guardian wishes to proceed.)        Anesthesia Quick Evaluation

## 2015-12-02 NOTE — H&P (View-Only) (Signed)
[facility] Consult Note:  History: Marissa Blankenship 11/27/2015  Referring physician: Elvina Sidle emergency room physician  Reason for consult/chief complaint: Abdominal Pain; nausea and vomiting; Chills; and change in bowel habits   Subjective HPI:  This is a 50 year old woman seen for the first time in our clinic for multitude of GI symptoms. She reports 17 years of chronic abdominal pain and was diagnosed with IBS. She was seen 2 or 3 years ago by Dr. Oletta Lamas of [facility], and reports having an upper endoscopy. She does not recall ever having had a colonoscopy done. She has a constellation of symptoms including generalized abdominal pain, chronic nausea with intermittent vomiting, and intermittent diarrhea. She denies rectal bleeding, but she has lost approximately 25 pounds over the last 6 months. She has had 2 visits to our ED, in late March and just a week ago. On both occasions she had acute worsening of her generalized abdominal pain and multiple episodes of bilious vomiting. Both visits included CT scan of the abdomen which were suggestive of Crohn's disease. The imaging findings for Crohn's were worsening on the most recent CT. Notes from the ED physician indicate that they advised her to be admitted but she declined. However, the patient seems to recall the conversation differently. At any rate, she was sent home on Cipro and Flagyl which it seems like she may have only taken for a few days. She was also given when necessary hydrocodone and Phenergan. She continues to have chronic low-grade periumbilical to right lower quadrant abdominal pain with episodic worsening that has vomiting a few times a week.  ROS:  Review of Systems  Constitutional: Negative for appetite change and unexpected weight change.  HENT: Negative for mouth sores and voice change.   Eyes: Negative for pain and redness.  Respiratory: Negative for cough and shortness of breath.   Cardiovascular: Negative  for chest pain and palpitations.  Genitourinary: Negative for dysuria and hematuria.  Musculoskeletal: Positive for back pain. Negative for myalgias and arthralgias.  Skin: Negative for pallor and rash.  Neurological: Negative for weakness and headaches.  Hematological: Negative for adenopathy.  Psychiatric/Behavioral: Positive for dysphoric mood. The patient is nervous/anxious.    Her back pain is probably lower  Past Medical History: Past Medical History  Diagnosis Date  . Hypertension   . Acid reflux   . Shortness of breath   . Anemia   . IBS (irritable bowel syndrome)     spastic colon     Past Surgical History: Past Surgical History  Procedure Laterality Date  . Cesarean section    . Wisdom tooth extraction       Family History: Family History  Problem Relation Age of Onset  . Hypertension Father 39  . Hypertension Mother 84  . Crohn's disease Sister   . Diabetes Paternal Grandmother   . Hypertension Mother   . Hypertension Father   . Hypertension Brother     Social History: Social History   Social History  . Marital Status: Divorced    Spouse Name: N/A  . Number of Children: 3  . Years of Education: N/A   Occupational History  . stay at home mom    Social History Main Topics  . Smoking status: Former Smoker    Quit date: 07/18/1997  . Smokeless tobacco: Never Used  . Alcohol Use: No  . Drug Use: No  . Sexual Activity: Yes   Other Topics Concern  . None   Social History Narrative  Allergies: No Known Allergies  Outpatient Meds: Current Outpatient Prescriptions  Medication Sig Dispense Refill  . dicyclomine (BENTYL) 20 MG tablet Take 20 mg by mouth 3 (three) times daily as needed (for abdominal pain.).   1  . HYDROcodone-acetaminophen (NORCO) 5-325 MG tablet Take 1-2 tablets by mouth every 6 (six) hours as needed for moderate pain or severe pain. 30 tablet 0  . lisinopril (PRINIVIL,ZESTRIL) 20 MG tablet Take 20 mg by mouth every  morning.  12  . metoprolol tartrate (LOPRESSOR) 25 MG tablet Take 50 mg by mouth daily.   1  . naproxen sodium (ANAPROX) 220 MG tablet Take 220 mg by mouth every 6 (six) hours as needed (for pain.).    Marland Kitchen pantoprazole (PROTONIX) 40 MG tablet Take 40 mg by mouth daily.  3  . promethazine (PHENERGAN) 25 MG tablet Take 1 tablet (25 mg total) by mouth every 8 (eight) hours as needed for nausea or vomiting. 20 tablet 0  . ciprofloxacin (CIPRO) 500 MG tablet Take 1 tablet (500 mg total) by mouth every 12 (twelve) hours. 14 tablet 0  . metroNIDAZOLE (FLAGYL) 500 MG tablet Take 1 tablet (500 mg total) by mouth 3 (three) times daily. One po bid x 7 days 21 tablet 0  . Na Sulfate-K Sulfate-Mg Sulf SOLN Take 1 kit by mouth once. 354 mL 0  . ondansetron (ZOFRAN) 4 MG tablet Take 1 tablet (4 mg total) by mouth every 8 (eight) hours as needed for nausea or vomiting. 30 tablet 1  . predniSONE (DELTASONE) 10 MG tablet Take 4 tablets (40 mg total) by mouth daily with breakfast. 100 tablet 0   No current facility-administered medications for this visit.      ___________________________________________________________________ Objective  Exam:  BP 104/70 mmHg  Pulse 64  Ht 5' 4"  (1.626 m)  Wt 149 lb 8 oz (67.813 kg)  BMI 25.65 kg/m2  LMP 11/06/2015  Breastfeeding? No   General: this is a(n) Middle-aged African-American woman in no acute distress. She does not look acutely ill and certainly nontoxic. Mucous membranes are moist him a vital signs as noted above  Eyes: sclera anicteric, no redness  ENT: oral mucosa moist without lesions, no cervical or supraclavicular lymphadenopathy, good dentition  CV: RRR without murmur, S1/S2, no JVD, no peripheral edema  Resp: clear to auscultation bilaterally, normal RR and effort noted  GI: soft, moderate RLQ and periumbilical tenderness, with active bowel sounds. No guarding or palpable organomegaly noted.  Skin; warm and dry, no rash or jaundice  noted  Neuro: awake, alert and oriented x 3. Normal gross motor function and fluent speech  Labs: Lab Results  Component Value Date   WBC 8.7 11/20/2015   HGB 9.4* 11/20/2015   HCT 29.9* 11/20/2015   MCV 74.6* 11/20/2015   PLT 536* 11/20/2015    CMP Latest Ref Rng 11/20/2015 10/12/2015 07/05/2011  Glucose 65 - 99 mg/dL 91 91 79  BUN 6 - 20 mg/dL 10 7 5(L)  Creatinine 0.44 - 1.00 mg/dL 0.71 0.73 0.59  Sodium 135 - 145 mmol/L 139 136 134(L)  Potassium 3.5 - 5.1 mmol/L 3.7 3.5 3.8  Chloride 101 - 111 mmol/L 99(L) 97(L) 104  CO2 22 - 32 mmol/L 28 27 20   Calcium 8.9 - 10.3 mg/dL 8.9 9.2 8.9  Total Protein 6.5 - 8.1 g/dL 7.2 8.2(H) 6.8  Total Bilirubin 0.3 - 1.2 mg/dL 0.7 0.7 0.2(L)  Alkaline Phos 38 - 126 U/L 68 63 124(H)  AST 15 - 41 U/L  14(L) 13(L) 14  ALT 14 - 54 U/L 13(L) 10(L) 10     Radiologic Studies:  CT march suggestive of Crohn's CT 5/5 was worse  Full reports are in the Epic system. Briefly, there is a long segment of market wall thickening with surrounding fluid in the distal ileum. Her some adjacent loops of fluid-filled small bowel. There is at least 1 fluid collection 5 x 2 cm it is suspected be outside the bowel. There is no free air and no proximal small bowel dilatation and no gastric distention.  I personally reviewed both CT scans in detail.   Assessment: Encounter Diagnoses  Name Primary?  . Abdominal pain, chronic, right lower quadrant Yes  . Abdominal pain, left lower quadrant   . Diarrhea, unspecified type   Cyclical vomiting, which suggests intermittent obstruction due to this process.  This patient appears to have severe ileal Crohn's disease. I think the intra-abdominal and pelvic fluid is reactive to the inflammation and not abscess.  Plan:  Over the course of 60 minute office visit, the following plan was finally agreed upon:  Prednisone 40 mg once daily Ciprofloxacin 500 mg twice daily and metronidazole 500 mg 3 times daily for 7  days Colonoscopy next week  The benefits and risks of the planned procedure were described in detail with the patient or (when appropriate) their health care proxy.  Risks were outlined as including, but not limited to, bleeding, infection, perforation, adverse medication reaction leading to cardiac or pulmonary decompensation, or pancreatitis (if ERCP).  The limitation of incomplete mucosal visualization was also discussed.  No guarantees or warranties were given.  As needed use of Zofran and Vicodin  She understands clearly that if she has significant worsening of symptoms between now and her schedule colonoscopy, such as worsening of abdominal pain, recurrence of vomiting, bili to keep down food or liquids, or fever over 100 agrees Fahrenheit, she should present immediately to the emergency room.  Thank you for the courtesy of this consult.  Please call me with any questions or concerns.  [name]  CC: Dr. Juluis Mire

## 2015-12-02 NOTE — Progress Notes (Signed)
Patient ID: Marissa Blankenship, female   DOB: 1965-12-23, 50 y.o.   MRN: [id number]    Progress Note   Subjective   Pt known to Dr Loletha Carrow with office consult  Last week - please see for details. She came to the ER last pm with persistent abdominal pain, and  nausea/vomiting with Cipro and  Flagyl.  K 2.9. She had been started on Prednisone last week but not sure she was keeping it down Was scheduled for outpt Colonoscopy today  She feels better since admit, and tolerating Cipro and Flagyl IV. She has completed prep without difficulty   Objective   Vital signs in last 24 hours: Temp:  [97.9 F (36.6 C)-98.4 F (36.9 C)] 98 F (36.7 C) (05/17 0645) Pulse Rate:  [54-69] 57 (05/17 0937) Resp:  [14-16] 16 (05/17 0645) BP: (135-176)/(80-111) 176/106 mmHg (05/17 0937) SpO2:  [100 %] 100 % (05/17 0645) Weight:  [150 lb 4.8 oz (68.176 kg)] 150 lb 4.8 oz (68.176 kg) (05/16 1752) Last BM Date: 12/02/15 General:    AA female in NAD Heart:  Regular rate and rhythm; no murmurs Lungs: Respirations even and unlabored, lungs CTA bilaterally Abdomen:  Soft, tender RLQ and nondistended. Normal bowel sounds. Extremities:  Without edema. Neurologic:  Alert and oriented,  grossly normal neurologically. Psych:  Cooperative. Normal mood and affect.  Intake/Output from previous day: 05/16 0701 - 05/17 0700 In: 1291.7 [I.V.:891.7; IV Piggyback:400] Out: -  Intake/Output this shift:    Lab Results:  Recent Labs  12/01/15 1415 12/02/15 0343  WBC 13.6* 11.5*  HGB 10.3* 10.4*  HCT 33.2* 34.1*  PLT 531* 589*   BMET  Recent Labs  12/01/15 1415 12/02/15 0343  NA 138 137  K 2.9* 4.6  CL 98* 101  CO2 31 28  GLUCOSE 89 132*  BUN 9 <5*  CREATININE 0.71 0.68  CALCIUM 9.0 9.1   LFT  Recent Labs  12/01/15 1415  PROT 7.1  ALBUMIN 2.9*  AST 12*  ALT 9*  ALKPHOS 60  BILITOT 0.4   PT/INR  Recent Labs  12/01/15 1938  LABPROT 13.9  INR 1.05    Studies/Results: Dg Abd Acute  W/chest  12/01/2015  CLINICAL DATA:  50 year old female with vomiting and generalized abdominal pain EXAM: DG ABDOMEN ACUTE W/ 1V CHEST COMPARISON:  Abdominal CT dated 11/20/2015 FINDINGS: There is no evidence of dilated bowel loops or free intraperitoneal air. No radiopaque calculi or other significant radiographic abnormality is seen. Heart size and mediastinal contours are within normal limits. Both lungs are clear. IMPRESSION: Negative abdominal radiographs.  No acute cardiopulmonary disease. Electronically Signed   By: Anner Crete M.D.   On: 12/01/2015 18:04       Assessment / Plan:    #1 50 yo female with probable acute Crohns ileitis with significant surrounding inflammatory changes #2 hypokalemia- corrected #3 anemia- secondary  to above #4 UTI  Plan; Solumedrol 40 mg IV daily  Continue IV Cipro and flagyl Colonoscopy this afternoon  Principal Problem:   Crohn's disease ([employer]) Active Problems:   UTI (lower urinary tract infection)   Hypokalemia   Inadequate oral intake     LOS: 1 day   Marissa Blankenship  12/02/2015, 9:44 AM

## 2015-12-02 NOTE — Progress Notes (Signed)
Initial Nutrition Assessment  DOCUMENTATION CODES:   Severe malnutrition in context of acute illness/injury  INTERVENTION:   Diet advancement per MD Discussed consistent intake and protein consumption RD to continue to monitor for nutritional needs  NUTRITION DIAGNOSIS:   Inadequate oral intake related to nausea, vomiting as evidenced by per patient/family report.  GOAL:   Patient will meet greater than or equal to 90% of their needs  MONITOR:   Diet advancement, Labs, Weight trends, I & O's  REASON FOR ASSESSMENT:   Malnutrition Screening Tool    ASSESSMENT:   50 y.o. female with medical history significant of recently diagnosed Crohn's disease, patient reported that she had a long history of bowel symptoms, and she was recently diagnosed with Crohn's. She was presented to the ED on the 5/5 and diagnosed with UTI, she was discharged on Cipro and Flagyl, she reported that since Saturday she quit taking medications because she couldn't tolerate them because of extreme nausea and vomiting. Last time she vomited was Saturday, but she acknowledged nausea and poor oral intake. She denies diarrhea, denies weight loss.  Patient reports N/V that has lasted for 2 weeks causing poor PO intake. Pt usually follows a strict diet of no roughage, dairy, beef and processed foods at home. She states that nothing had changed except she was prescribed an antibiotic for an infection then noticed symptoms after taking this.  Pt usually only eats 2 meals a day. Encouraged consistent meal intake and use of probiotic foods. For weight loss, encouraged adequate intake of protein foods. Pt currently NPO for pending colonoscopy this afternoon.  Per patient, UBW is 165 lb. Per weight history, pt has lost 15 lb over 12 days (9% wt loss x 12 days, significant for time frame). Nutrition focused physical exam shows no sign of depletion of muscle mass or body fat.  Labs reviewed. Medications: D5 w/ NaCl  infusion at 100 ml/hr- provides 408 kcal  Diet Order:  Diet NPO time specified  Skin:  Reviewed, no issues  Last BM:  5/17  Height:   Ht Readings from Last 1 Encounters:  12/01/15 5' 4"  (1.626 m)    Weight:   Wt Readings from Last 1 Encounters:  12/01/15 150 lb 4.8 oz (68.176 kg)    Ideal Body Weight:  54.5 kg  BMI:  Body mass index is 25.79 kg/(m^2).  Estimated Nutritional Needs:   Kcal:  1700-1900  Protein:  80-90g  Fluid:  1.8L/day  EDUCATION NEEDS:   Education needs addressed  Clayton Bibles, MS, RD, LDN Pager: [phone] After Hours Pager: [phone]

## 2015-12-03 ENCOUNTER — Encounter (HOSPITAL_COMMUNITY): Payer: Self-pay | Admitting: Gastroenterology

## 2015-12-03 DIAGNOSIS — R1031 Right lower quadrant pain: Secondary | ICD-10-CM | POA: Insufficient documentation

## 2015-12-03 DIAGNOSIS — E43 Unspecified severe protein-calorie malnutrition: Secondary | ICD-10-CM

## 2015-12-03 LAB — CBC
HCT: 28.9 % — ABNORMAL LOW (ref 36.0–46.0)
Hemoglobin: 9.1 g/dL — ABNORMAL LOW (ref 12.0–15.0)
MCH: 23.7 pg — AB (ref 26.0–34.0)
MCHC: 31.5 g/dL (ref 30.0–36.0)
MCV: 75.3 fL — AB (ref 78.0–100.0)
PLATELETS: 446 10*3/uL — AB (ref 150–400)
RBC: 3.84 MIL/uL — AB (ref 3.87–5.11)
RDW: 17.6 % — ABNORMAL HIGH (ref 11.5–15.5)
WBC: 10.5 10*3/uL (ref 4.0–10.5)

## 2015-12-03 LAB — FERRITIN: Ferritin: 73 ng/mL (ref 11–307)

## 2015-12-03 LAB — RETICULOCYTES
RBC.: 4.58 MIL/uL (ref 3.87–5.11)
Retic Count, Absolute: 87 10*3/uL (ref 19.0–186.0)
Retic Ct Pct: 1.9 % (ref 0.4–3.1)

## 2015-12-03 LAB — HEMOGLOBIN A1C
Hgb A1c MFr Bld: 5.7 % — ABNORMAL HIGH (ref 4.8–5.6)
MEAN PLASMA GLUCOSE: 117 mg/dL

## 2015-12-03 LAB — BASIC METABOLIC PANEL
Anion gap: 5 (ref 5–15)
CHLORIDE: 104 mmol/L (ref 101–111)
CO2: 28 mmol/L (ref 22–32)
CREATININE: 0.68 mg/dL (ref 0.44–1.00)
Calcium: 8.6 mg/dL — ABNORMAL LOW (ref 8.9–10.3)
GFR calc non Af Amer: >60 mL/min (ref >60)
GLUCOSE: 101 mg/dL — AB (ref 65–99)
Potassium: 3.4 mmol/L — ABNORMAL LOW (ref 3.5–5.1)
Sodium: 137 mmol/L (ref 135–145)

## 2015-12-03 LAB — URINE CULTURE

## 2015-12-03 LAB — IRON AND TIBC
IRON: 34 ug/dL (ref 28–170)
SATURATION RATIOS: 16 % (ref 10.4–31.8)
TIBC: 218 ug/dL — AB (ref 250–450)
UIBC: 184 ug/dL

## 2015-12-03 LAB — FOLATE: FOLATE: 10.4 ng/mL (ref >5.9)

## 2015-12-03 LAB — MAGNESIUM: Magnesium: 1.8 mg/dL (ref 1.7–2.4)

## 2015-12-03 LAB — VITAMIN B12: VITAMIN B 12: 752 pg/mL (ref 180–914)

## 2015-12-03 MED ORDER — HEPARIN SODIUM (PORCINE) 5000 UNIT/ML IJ SOLN
5000.0000 [IU] | Freq: Three times a day (TID) | INTRAMUSCULAR | Status: DC
  Administered 2015-12-03 – 2015-12-05 (×6): 5000 [IU] via SUBCUTANEOUS
  Filled 2015-12-03 (×6): qty 1

## 2015-12-03 MED ORDER — POTASSIUM CHLORIDE CRYS ER 20 MEQ PO TBCR
40.0000 meq | EXTENDED_RELEASE_TABLET | Freq: Once | ORAL | Status: AC
  Administered 2015-12-03: 40 meq via ORAL
  Filled 2015-12-03: qty 2

## 2015-12-03 MED ORDER — MAGNESIUM SULFATE 2 GM/50ML IV SOLN
2.0000 g | Freq: Once | INTRAVENOUS | Status: AC
  Administered 2015-12-03: 2 g via INTRAVENOUS
  Filled 2015-12-03: qty 50

## 2015-12-03 MED ORDER — LISINOPRIL 20 MG PO TABS
40.0000 mg | ORAL_TABLET | Freq: Every morning | ORAL | Status: DC
  Administered 2015-12-03 – 2015-12-05 (×3): 40 mg via ORAL
  Filled 2015-12-03 (×3): qty 2

## 2015-12-03 MED ORDER — SODIUM CHLORIDE 0.9 % IV SOLN
INTRAVENOUS | Status: DC
  Administered 2015-12-03: 17:00:00 via INTRAVENOUS

## 2015-12-03 MED ORDER — PNEUMOCOCCAL VAC POLYVALENT 25 MCG/0.5ML IJ INJ
0.5000 mL | INJECTION | INTRAMUSCULAR | Status: AC
  Administered 2015-12-04: 0.5 mL via INTRAMUSCULAR
  Filled 2015-12-03 (×2): qty 0.5

## 2015-12-03 MED ORDER — INFLUENZA VAC SPLIT QUAD 0.5 ML IM SUSY
0.5000 mL | PREFILLED_SYRINGE | INTRAMUSCULAR | Status: AC
  Administered 2015-12-04: 0.5 mL via INTRAMUSCULAR
  Filled 2015-12-03 (×2): qty 0.5

## 2015-12-03 MED ORDER — METHYLPREDNISOLONE SODIUM SUCC 40 MG IJ SOLR
20.0000 mg | Freq: Three times a day (TID) | INTRAMUSCULAR | Status: DC
  Administered 2015-12-03 – 2015-12-05 (×6): 20 mg via INTRAVENOUS
  Filled 2015-12-03 (×6): qty 1

## 2015-12-03 NOTE — Progress Notes (Signed)
Patient ID: Marissa Blankenship, female   DOB: 08-03-1965, 50 y.o.   MRN: [id number]    Progress Note   Subjective   Still feeling about the same- lower abdominal pain, able to tolerate po's   Objective   Vital signs in last 24 hours: Temp:  [97.7 F (36.5 C)-98.7 F (37.1 C)] 98.4 F (36.9 C) (05/18 0547) Pulse Rate:  [51-58] 55 (05/18 0547) Resp:  [11-25] 16 (05/18 0547) BP: (95-189)/(49-104) 156/95 mmHg (05/18 0547) SpO2:  [100 %] 100 % (05/18 0547) Weight:  [150 lb (68.04 kg)] 150 lb (68.04 kg) (05/17 1233) Last BM Date: 12/02/15 General:    AA female in NAD Heart:  Regular rate and rhythm; no murmurs Lungs: Respirations even and unlabored, lungs CTA bilaterally Abdomen:  Soft, tender right lower abdomen and nondistended. Normal bowel sounds. Extremities:  Without edema. Neurologic:  Alert and oriented,  grossly normal neurologically. Psych:  Cooperative. Normal mood and affect.  Intake/Output from previous day: 05/17 0701 - 05/18 0700 In: 540 [P.O.:240; I.V.:300] Out: -  Intake/Output this shift:    Lab Results:  Recent Labs  12/01/15 1415 12/02/15 0343 12/03/15 0346  WBC 13.6* 11.5* 10.5  HGB 10.3* 10.4* 9.1*  HCT 33.2* 34.1* 28.9*  PLT 531* 589* 446*   BMET  Recent Labs  12/01/15 1415 12/02/15 0343 12/03/15 0346  NA 138 137 137  K 2.9* 4.6 3.4*  CL 98* 101 104  CO2 31 28 28   GLUCOSE 89 132* 101*  BUN 9 <5* <5*  CREATININE 0.71 0.68 0.68  CALCIUM 9.0 9.1 8.6*   LFT  Recent Labs  12/01/15 1415  PROT 7.1  ALBUMIN 2.9*  AST 12*  ALT 9*  ALKPHOS 60  BILITOT 0.4   PT/INR  Recent Labs  12/01/15 1938  LABPROT 13.9  INR 1.05    Studies/Results: Dg Abd Acute W/chest  12/01/2015  CLINICAL DATA:  50 year old female with vomiting and generalized abdominal pain EXAM: DG ABDOMEN ACUTE W/ 1V CHEST COMPARISON:  Abdominal CT dated 11/20/2015 FINDINGS: There is no evidence of dilated bowel loops or free intraperitoneal air. No radiopaque calculi or  other significant radiographic abnormality is seen. Heart size and mediastinal contours are within normal limits. Both lungs are clear. IMPRESSION: Negative abdominal radiographs.  No acute cardiopulmonary disease. Electronically Signed   By: Anner Crete M.D.   On: 12/01/2015 18:04       Assessment / Plan:    #1 50 yo female with new dx of severe Crohns ileocolitis- Colonoscopy yesterday , unable to get in to TI  Secondary to significant inflammation   BX pending #2 anemia - secondary to above - check fe studies, B12/folate #3 possible UTI- culture shoes multiple species - suggest recollect- will stop IV flagyl- leave on Cipro today - defer to hospitalist  Plan  IV Solumedrol- change to 20 TID Stop flagyl Will check quantiferon gold , hepatitis serologies, give pneumovax , and infuenza vaccines in anticipation of need for Biologic to manage Crohns  Principal Problem:   Crohn's disease ([place]) Active Problems:   Essential hypertension   ACID REFLUX DISEASE   UTI (lower urinary tract infection)   Hypokalemia   Inadequate oral intake   Protein-calorie malnutrition, severe     LOS: 2 days   Marissa Blankenship  12/03/2015, 10:14 AM

## 2015-12-03 NOTE — Care Management Note (Signed)
Case Management Note  Patient Details  Name: Marissa Blankenship MRN: [id number] Date of Birth: 03/19/1966  Subjective/Objective:     50 yo admitted with Crohn's  disease             Action/Plan: From home with children.  Expected Discharge Date:   (unknown)               Expected Discharge Plan:  Home/Self Care  In-House Referral:     Discharge planning Services  CM Consult  Post Acute Care Choice:    Choice offered to:     DME Arranged:    DME Agency:     HH Arranged:    HH Agency:     Status of Service:  In process, will continue to follow  Medicare Important Message Given:    Date Medicare IM Given:    Medicare IM give by:    Date Additional Medicare IM Given:    Additional Medicare Important Message give by:     If discussed at [place] of Stay Meetings, dates discussed:    Additional Comments: Chart reviewed and no CM needs identified or communicated.  CM will continue to follow. Lynnell Catalan, RN 12/03/2015, 11:50 AM [phone]

## 2015-12-03 NOTE — Progress Notes (Signed)
PROGRESS NOTE    Marissa Blankenship  HUT:[id number] DOB: Nov 01, 1965 DOA: 12/01/2015 PCP: Kerin Perna, NP    Brief Narrative:  Marissa Blankenship is a 50 y.o. female with medical history significant of recently diagnosed Crohn's disease, patient reported that she had a long history of bowel symptoms, and she was recently diagnosed with Crohn's. She was presented to the ED on the 5/5 and diagnosed with UTI, she was discharged on Cipro and Flagyl, she reported that since Saturday she quit taking medications because she couldn't tolerate them because of extreme nausea and vomiting. Last time she vomited was Saturday, but she acknowledged nausea and poor oral intake. She denies diarrhea, denies weight loss. ED Course: NAD potassium is 2.9, WBC 13.6, urinalysis again notable for TNTC WBC and rare bacteriuria. CT scan from 5/5 showed abnormal terminal ileum with wall thickening consistent with Crohn's disease. No colonic involvement.  Assessment & Plan:   Principal Problem:   Crohn's disease ([place]) Active Problems:   UTI (lower urinary tract infection)   Essential hypertension   ACID REFLUX DISEASE   Hypokalemia   Inadequate oral intake   Protein-calorie malnutrition, severe   RLQ abdominal pain  #1 Crohn's disease flare Patient presenting with abdominal pain, nausea vomiting and poor oral intake. CT of the abdomen and pelvis done on 11/20/2015 with grossly abnormal loops of distal and terminal ileum with wall thickening and mucosal enhancement suggestive of Crohn's disease. Clearly reactive free fluid in the pelvis. Possible sacroiliitis. Patient with complaints of lower abdominal pain and nausea today. Patient status post colonoscopy with findings consistent with Crohn's disease. Biopsies pending. Continue IV steroids. IV Flagyl has been discontinued per gastroenterology. Gastroenterology following and appreciate input and recommendations.   #2 UTI Urine cultures with multiple morphotypes. Continue  IV ciprofloxacin.  #3 hypokalemia Likely secondary to GI losses. Replete. Follow.  #4 gastroesophageal reflux disease PPI.  #5 hypertension Continue Lopressor. Increase lisinopril to 40 mg daily. Hydralazine when necessary.    DVT prophylaxis: SCDs Code Status: Full Family Communication: Updated patient and family at bedside. Disposition Plan: Pending GI evaluation and further workup of her Crohn's flare..   Consultants:   Gastroenterology:Dr Danis III 12/01/2015  Procedures:   Acute abdominal series 12/01/2015  Colonoscopy 12/02/2015 per Dr. Loletha Carrow III  Antimicrobials:   IV ciprofloxacin 12/01/2015  IV Flagyl 12/01/2015>>>>> 12/03/2015   Subjective: Patient states does not feel too well today. Patient complaining of lower abdominal pain and nausea.  Objective: Filed Vitals:   12/02/15 1404 12/02/15 1531 12/02/15 2206 12/03/15 0547  BP: 153/85 168/93 158/91 156/95  Pulse: 55 56 58 55  Temp: 97.7 F (36.5 C) 98.5 F (36.9 C) 98.7 F (37.1 C) 98.4 F (36.9 C)  TempSrc:  Oral Oral Oral  Resp: 17 18 18 16   Height:      Weight:      SpO2: 100% 100% 100% 100%    Intake/Output Summary (Last 24 hours) at 12/03/15 1111 Last data filed at 12/02/15 1532  Gross per 24 hour  Intake    540 ml  Output      0 ml  Net    540 ml   Filed Weights   12/01/15 1752 12/02/15 1233  Weight: 68.176 kg (150 lb 4.8 oz) 68.04 kg (150 lb)    Examination:  General exam: Appears calm and comfortable  Respiratory system: Clear to auscultation. Respiratory effort normal. Cardiovascular system: S1 & S2 heard, RRR. No JVD, murmurs, rubs, gallops or clicks. No pedal edema. Gastrointestinal  system: Abdomen is nondistended, soft and Tender to palpation bilateral lower abdominal region/quadrants. Normal bowel sounds heard. Central nervous system: Alert and oriented. No focal neurological deficits. Extremities: Symmetric 5 x 5 power. Skin: No rashes, lesions or ulcers Psychiatry:  Judgement and insight appear normal. Mood & affect appropriate.     Data Reviewed: I have personally reviewed following labs and imaging studies  CBC:  Recent Labs Lab 12/01/15 1415 12/02/15 0343 12/03/15 0346  WBC 13.6* 11.5* 10.5  HGB 10.3* 10.4* 9.1*  HCT 33.2* 34.1* 28.9*  MCV 74.8* 75.3* 75.3*  PLT 531* 589* 010*   Basic Metabolic Panel:  Recent Labs Lab 12/01/15 1410 12/01/15 1415 12/02/15 0343 12/03/15 0346  NA  --  138 137 137  K  --  2.9* 4.6 3.4*  CL  --  98* 101 104  CO2  --  31 28 28   GLUCOSE  --  89 132* 101*  BUN  --  9 <5* <5*  CREATININE  --  0.71 0.68 0.68  CALCIUM  --  9.0 9.1 8.6*  MG 1.9  --   --  1.8   GFR: Estimated Creatinine Clearance: 82.3 mL/min (by C-G formula based on Cr of 0.68). Liver Function Tests:  Recent Labs Lab 12/01/15 1415  AST 12*  ALT 9*  ALKPHOS 60  BILITOT 0.4  PROT 7.1  ALBUMIN 2.9*    Recent Labs Lab 12/01/15 1415  LIPASE 12   No results for input(s): AMMONIA in the last 168 hours. Coagulation Profile:  Recent Labs Lab 12/01/15 1938  INR 1.05   Cardiac Enzymes: No results for input(s): CKTOTAL, CKMB, CKMBINDEX, TROPONINI in the last 168 hours. BNP (last 3 results) No results for input(s): PROBNP in the last 8760 hours. HbA1C:  Recent Labs  12/01/15 1938  HGBA1C 5.7*   CBG: No results for input(s): GLUCAP in the last 168 hours. Lipid Profile: No results for input(s): CHOL, HDL, LDLCALC, TRIG, CHOLHDL, LDLDIRECT in the last 72 hours. Thyroid Function Tests:  Recent Labs  12/01/15 1938  TSH 1.839   Anemia Panel: No results for input(s): VITAMINB12, FOLATE, FERRITIN, TIBC, IRON, RETICCTPCT in the last 72 hours. Sepsis Labs: No results for input(s): PROCALCITON, LATICACIDVEN in the last 168 hours.  Recent Results (from the past 240 hour(s))  Culture, Urine     Status: Abnormal   Collection Time: 12/01/15  3:11 PM  Result Value Ref Range Status   Specimen Description URINE, CLEAN  CATCH  Final   Special Requests NONE  Final   Culture MULTIPLE SPECIES PRESENT, SUGGEST RECOLLECTION (A)  Final   Report Status 12/03/2015 FINAL  Final         Radiology Studies: Dg Abd Acute W/chest  12/01/2015  CLINICAL DATA:  50 year old female with vomiting and generalized abdominal pain EXAM: DG ABDOMEN ACUTE W/ 1V CHEST COMPARISON:  Abdominal CT dated 11/20/2015 FINDINGS: There is no evidence of dilated bowel loops or free intraperitoneal air. No radiopaque calculi or other significant radiographic abnormality is seen. Heart size and mediastinal contours are within normal limits. Both lungs are clear. IMPRESSION: Negative abdominal radiographs.  No acute cardiopulmonary disease. Electronically Signed   By: Anner Crete M.D.   On: 12/01/2015 18:04        Scheduled Meds: . ciprofloxacin  400 mg Intravenous Q12H  . heparin subcutaneous  5,000 Units Subcutaneous Q8H  . [START ON 12/04/2015] Influenza vac split quadrivalent PF  0.5 mL Intramuscular Tomorrow-1000  . lisinopril  40 mg Oral q  morning - 10a  . magnesium sulfate 1 - 4 g bolus IVPB  2 g Intravenous Once  . methylPREDNISolone (SOLU-MEDROL) injection  20 mg Intravenous TID  . pantoprazole  40 mg Oral Daily  . [START ON 12/04/2015] pneumococcal 23 valent vaccine  0.5 mL Intramuscular Tomorrow-1000   Continuous Infusions:     LOS: 2 days    Time spent: 42 mins    Normon Pettijohn, MD [facility] Pager [phone]  If 7PM-7AM, please contact night-coverage [email or web address] Password [email or web address] 12/03/2015, 11:11 AM

## 2015-12-04 DIAGNOSIS — R197 Diarrhea, unspecified: Secondary | ICD-10-CM | POA: Insufficient documentation

## 2015-12-04 LAB — BASIC METABOLIC PANEL
ANION GAP: 4 — AB (ref 5–15)
BUN: <5 mg/dL — ABNORMAL LOW (ref 6–20)
CALCIUM: 8.9 mg/dL (ref 8.9–10.3)
CHLORIDE: 102 mmol/L (ref 101–111)
CO2: 28 mmol/L (ref 22–32)
CREATININE: 0.64 mg/dL (ref 0.44–1.00)
GFR calc non Af Amer: >60 mL/min (ref >60)
Glucose, Bld: 127 mg/dL — ABNORMAL HIGH (ref 65–99)
POTASSIUM: 4.6 mmol/L (ref 3.5–5.1)
SODIUM: 134 mmol/L — AB (ref 135–145)

## 2015-12-04 LAB — HEPATITIS PANEL, ACUTE
HEP A IGM: NEGATIVE
HEP B C IGM: NEGATIVE
HEP B S AG: NEGATIVE

## 2015-12-04 LAB — CBC
HCT: 30.5 % — ABNORMAL LOW (ref 36.0–46.0)
Hemoglobin: 9.4 g/dL — ABNORMAL LOW (ref 12.0–15.0)
MCH: 22.7 pg — ABNORMAL LOW (ref 26.0–34.0)
MCHC: 30.8 g/dL (ref 30.0–36.0)
MCV: 73.5 fL — AB (ref 78.0–100.0)
PLATELETS: 471 10*3/uL — AB (ref 150–400)
RBC: 4.15 MIL/uL (ref 3.87–5.11)
RDW: 17.6 % — ABNORMAL HIGH (ref 11.5–15.5)
WBC: 10.9 10*3/uL — AB (ref 4.0–10.5)

## 2015-12-04 MED ORDER — METOPROLOL TARTRATE 50 MG PO TABS
50.0000 mg | ORAL_TABLET | Freq: Every day | ORAL | Status: DC
Start: 2015-12-04 — End: 2015-12-05
  Administered 2015-12-04 – 2015-12-05 (×2): 50 mg via ORAL
  Filled 2015-12-04 (×2): qty 1

## 2015-12-04 NOTE — Progress Notes (Signed)
PROGRESS NOTE    Marissa Blankenship  EZM:[id number] DOB: 03/22/1966 DOA: 12/01/2015 PCP: Kerin Perna, NP    Brief Narrative:  Marissa Blankenship is a 50 y.o. female with medical history significant of recently diagnosed Crohn's disease, patient reported that she had a long history of bowel symptoms, and she was recently diagnosed with Crohn's. She was presented to the ED on the 5/5 and diagnosed with UTI, she was discharged on Cipro and Flagyl, she reported that since Saturday she quit taking medications because she couldn't tolerate them because of extreme nausea and vomiting. Last time she vomited was Saturday, but she acknowledged nausea and poor oral intake. She denies diarrhea, denies weight loss. ED Course: NAD potassium is 2.9, WBC 13.6, urinalysis again notable for TNTC WBC and rare bacteriuria. CT scan from 5/5 showed abnormal terminal ileum with wall thickening consistent with Crohn's disease. No colonic involvement.  Assessment & Plan:   Principal Problem:   Crohn's disease ([place]) Active Problems:   UTI (lower urinary tract infection)   Essential hypertension   ACID REFLUX DISEASE   Hypokalemia   Inadequate oral intake   Protein-calorie malnutrition, severe   RLQ abdominal pain  #1 Crohn's disease flare Patient presenting with abdominal pain, nausea vomiting and poor oral intake. CT of the abdomen and pelvis done on 11/20/2015 with grossly abnormal loops of distal and terminal ileum with wall thickening and mucosal enhancement suggestive of Crohn's disease. Clearly reactive free fluid in the pelvis. Possible sacroiliitis. Patient with improvement of lower abdominal pain and nausea. Patient status post colonoscopy with findings consistent with Crohn's disease. Biopsies pending. Continue IV steroids. IV Flagyl has been discontinued per gastroenterology. Gastroenterology following and appreciate input and recommendations.   #2 UTI Urine cultures with multiple morphotypes. Continue IV  ciprofloxacin.  #3 hypokalemia Likely secondary to GI losses. Repleted. Follow.  #4 gastroesophageal reflux disease PPI.  #5 hypertension Continue Lopressor. Increased lisinopril to 40 mg daily. Hydralazine when necessary.    DVT prophylaxis: SCDs Code Status: Full Family Communication: Updated patient and family at bedside. Disposition Plan: Pending GI evaluation and further workup of her Crohn's flare..   Consultants:   Gastroenterology:Dr Danis III 12/01/2015  Procedures:   Acute abdominal series 12/01/2015  Colonoscopy 12/02/2015 per Dr. Loletha Carrow III  Antimicrobials:   IV ciprofloxacin 12/01/2015  IV Flagyl 12/01/2015>>>>> 12/03/2015   Subjective: Patient states feels better than yesterday. No emesis. Patient states abdominal pain improved.  Objective: Filed Vitals:   12/03/15 0547 12/03/15 1450 12/03/15 2040 12/04/15 1434  BP: 156/95 169/92 169/103 172/99  Pulse: 55 61 64 50  Temp: 98.4 F (36.9 C) 98.5 F (36.9 C) 98.6 F (37 C) 98.7 F (37.1 C)  TempSrc: Oral Oral Oral Oral  Resp: 16 14 16 14   Height:      Weight:      SpO2: 100% 100% 100% 100%    Intake/Output Summary (Last 24 hours) at 12/04/15 1714 Last data filed at 12/04/15 1709  Gross per 24 hour  Intake   1075 ml  Output      0 ml  Net   1075 ml   Filed Weights   12/01/15 1752 12/02/15 1233  Weight: 68.176 kg (150 lb 4.8 oz) 68.04 kg (150 lb)    Examination:  General exam: Appears calm and comfortable  Respiratory system: Clear to auscultation. Respiratory effort normal. Cardiovascular system: S1 & S2 heard, RRR. No JVD, murmurs, rubs, gallops or clicks. No pedal edema. Gastrointestinal system: Abdomen is nondistended, soft and  less Tender to palpation RLQ. Normal bowel sounds heard. Central nervous system: Alert and oriented. No focal neurological deficits. Extremities: Symmetric 5 x 5 power. Skin: No rashes, lesions or ulcers Psychiatry: Judgement and insight appear normal. Mood  & affect appropriate.     Data Reviewed: I have personally reviewed following labs and imaging studies  CBC:  Recent Labs Lab 12/01/15 1415 12/02/15 0343 12/03/15 0346 12/04/15 0333  WBC 13.6* 11.5* 10.5 10.9*  HGB 10.3* 10.4* 9.1* 9.4*  HCT 33.2* 34.1* 28.9* 30.5*  MCV 74.8* 75.3* 75.3* 73.5*  PLT 531* 589* 446* 774*   Basic Metabolic Panel:  Recent Labs Lab 12/01/15 1410 12/01/15 1415 12/02/15 0343 12/03/15 0346 12/04/15 0333  NA  --  138 137 137 134*  K  --  2.9* 4.6 3.4* 4.6  CL  --  98* 101 104 102  CO2  --  31 28 28 28   GLUCOSE  --  89 132* 101* 127*  BUN  --  9 <5* <5* <5*  CREATININE  --  0.71 0.68 0.68 0.64  CALCIUM  --  9.0 9.1 8.6* 8.9  MG 1.9  --   --  1.8  --    GFR: Estimated Creatinine Clearance: 82.3 mL/min (by C-G formula based on Cr of 0.64). Liver Function Tests:  Recent Labs Lab 12/01/15 1415  AST 12*  ALT 9*  ALKPHOS 60  BILITOT 0.4  PROT 7.1  ALBUMIN 2.9*    Recent Labs Lab 12/01/15 1415  LIPASE 12   No results for input(s): AMMONIA in the last 168 hours. Coagulation Profile:  Recent Labs Lab 12/01/15 1938  INR 1.05   Cardiac Enzymes: No results for input(s): CKTOTAL, CKMB, CKMBINDEX, TROPONINI in the last 168 hours. BNP (last 3 results) No results for input(s): PROBNP in the last 8760 hours. HbA1C:  Recent Labs  12/01/15 1938  HGBA1C 5.7*   CBG: No results for input(s): GLUCAP in the last 168 hours. Lipid Profile: No results for input(s): CHOL, HDL, LDLCALC, TRIG, CHOLHDL, LDLDIRECT in the last 72 hours. Thyroid Function Tests:  Recent Labs  12/01/15 1938  TSH 1.839   Anemia Panel:  Recent Labs  12/03/15 1223  VITAMINB12 752  FOLATE 10.4  FERRITIN 73  TIBC 218*  IRON 34  RETICCTPCT 1.9   Sepsis Labs: No results for input(s): PROCALCITON, LATICACIDVEN in the last 168 hours.  Recent Results (from the past 240 hour(s))  Culture, Urine     Status: Abnormal   Collection Time: 12/01/15  3:11 PM    Result Value Ref Range Status   Specimen Description URINE, CLEAN CATCH  Final   Special Requests NONE  Final   Culture MULTIPLE SPECIES PRESENT, SUGGEST RECOLLECTION (A)  Final   Report Status 12/03/2015 FINAL  Final         Radiology Studies: No results found.      Scheduled Meds: . ciprofloxacin  400 mg Intravenous Q12H  . heparin subcutaneous  5,000 Units Subcutaneous Q8H  . lisinopril  40 mg Oral q morning - 10a  . methylPREDNISolone (SOLU-MEDROL) injection  20 mg Intravenous TID  . metoprolol tartrate  50 mg Oral Daily  . pantoprazole  40 mg Oral Daily   Continuous Infusions:     LOS: 3 days    Time spent: 44 mins    THOMPSON,DANIEL, MD [facility] Pager [phone]  If 7PM-7AM, please contact night-coverage [email or web address] Password [email or web address] 12/04/2015, 5:14 PM

## 2015-12-04 NOTE — Progress Notes (Signed)
Patient ID: Marissa Blankenship, female   DOB: 07-Nov-1965, 50 y.o.   MRN: [id number]    Progress Note   Subjective   Feeling better today, less pain, and hungry. No N/V. Still using IV pain meds     Objective   Vital signs in last 24 hours: Temp:  [98.5 F (36.9 C)-98.6 F (37 C)] 98.6 F (37 C) (05/18 2040) Pulse Rate:  [61-64] 64 (05/18 2040) Resp:  [14-16] 16 (05/18 2040) BP: (169)/(92-103) 169/103 mmHg (05/18 2040) SpO2:  [100 %] 100 % (05/18 2040) Last BM Date: 12/02/15 General:    AA female in NAD Heart:  Regular rate and rhythm; no murmurs Lungs: Respirations even and unlabored, lungs CTA bilaterally Abdomen:  Soft,tenderRLQ , no guarding and nondistended. Normal bowel sounds. Extremities:  Without edema. Neurologic:  Alert and oriented,  grossly normal neurologically. Psych:  Cooperative. Normal mood and affect.  Intake/Output from previous day: 05/18 0701 - 05/19 0700 In: 34 [P.O.:835] Out: -  Intake/Output this shift:    Lab Results:  Recent Labs  12/02/15 0343 12/03/15 0346 12/04/15 0333  WBC 11.5* 10.5 10.9*  HGB 10.4* 9.1* 9.4*  HCT 34.1* 28.9* 30.5*  PLT 589* 446* 471*   BMET  Recent Labs  12/02/15 0343 12/03/15 0346 12/04/15 0333  NA 137 137 134*  K 4.6 3.4* 4.6  CL 101 104 102  CO2 28 28 28   GLUCOSE 132* 101* 127*  BUN <5* <5* <5*  CREATININE 0.68 0.68 0.64  CALCIUM 9.1 8.6* 8.9   LFT  Recent Labs  12/01/15 1415  PROT 7.1  ALBUMIN 2.9*  AST 12*  ALT 9*  ALKPHOS 60  BILITOT 0.4   PT/INR  Recent Labs  12/01/15 1938  LABPROT 13.9  INR 1.05       Assessment / Plan:    #1 50 yo AA female with  New dx of Crohns ileocolitis-  She is improving on Solumedrol Bx show chronic active colitis (unable to get int to T.I . ) #2 anemia- secondary to above- anemia panel negative  Plan; Advance to low residue soft diet Continue Iv Solumedrol  have encouraged  Pt to use oral pain meds , try to get off IV narcotics Initial pre  Biologic workup being done- she has not received vaccines as yet She may need a couple more  days inpatient care    Principal Problem:   Crohn's disease ([place]) Active Problems:   Essential hypertension   ACID REFLUX DISEASE   UTI (lower urinary tract infection)   Hypokalemia   Inadequate oral intake   Protein-calorie malnutrition, severe   RLQ abdominal pain     LOS: 3 days   Marissa Blankenship  12/04/2015, 8:48 AM

## 2015-12-05 LAB — BASIC METABOLIC PANEL
Anion gap: 6 (ref 5–15)
BUN: 11 mg/dL (ref 6–20)
CALCIUM: 8.9 mg/dL (ref 8.9–10.3)
CO2: 27 mmol/L (ref 22–32)
CREATININE: 0.8 mg/dL (ref 0.44–1.00)
Chloride: 102 mmol/L (ref 101–111)
GFR calc Af Amer: >60 mL/min (ref >60)
Glucose, Bld: 128 mg/dL — ABNORMAL HIGH (ref 65–99)
POTASSIUM: 4.5 mmol/L (ref 3.5–5.1)
SODIUM: 135 mmol/L (ref 135–145)

## 2015-12-05 LAB — CBC
HCT: 32.6 % — ABNORMAL LOW (ref 36.0–46.0)
Hemoglobin: 10 g/dL — ABNORMAL LOW (ref 12.0–15.0)
MCH: 23 pg — ABNORMAL LOW (ref 26.0–34.0)
MCHC: 30.7 g/dL (ref 30.0–36.0)
MCV: 74.9 fL — ABNORMAL LOW (ref 78.0–100.0)
PLATELETS: 502 10*3/uL — AB (ref 150–400)
RBC: 4.35 MIL/uL (ref 3.87–5.11)
RDW: 17.9 % — AB (ref 11.5–15.5)
WBC: 13.6 10*3/uL — AB (ref 4.0–10.5)

## 2015-12-05 MED ORDER — HYDROCODONE-ACETAMINOPHEN 5-325 MG PO TABS: 1.0000 | ORAL_TABLET | Freq: Four times a day (QID) | ORAL | Status: DC | PRN

## 2015-12-05 MED ORDER — ONDANSETRON HCL 4 MG PO TABS: 4.0000 mg | ORAL_TABLET | Freq: Three times a day (TID) | ORAL | Status: DC | PRN

## 2015-12-05 MED ORDER — PREDNISONE 20 MG PO TABS: 60.0000 mg | ORAL_TABLET | Freq: Every day | ORAL | Status: DC

## 2015-12-05 MED ORDER — LISINOPRIL 40 MG PO TABS: 40.0000 mg | ORAL_TABLET | Freq: Every morning | ORAL | Status: DC

## 2015-12-05 NOTE — Discharge Summary (Signed)
Physician Discharge Summary  Marissa Blankenship GEX:[id number] DOB: 11-03-1965 DOA: 12/01/2015  PCP: Kerin Perna, NP  Admit date: 12/01/2015 Discharge date: 12/05/2015  Time spent: 55 minutes  Recommendations for Outpatient Follow-up:  1. Follow-up with gastroenterology. Office will call with appointment time. 2. Follow-up with EDWARDS, MICHELLE P, NP and 1-2 weeks or as previously scheduled. On follow-up patient will need a basic metabolic profile done to follow-up on electrolytes and renal function.   Discharge Diagnoses:  Principal Problem:   Crohn's disease ([place]) Active Problems:   UTI (lower urinary tract infection)   Essential hypertension   ACID REFLUX DISEASE   Hypokalemia   Inadequate oral intake   Protein-calorie malnutrition, severe   RLQ abdominal pain   Diarrhea   Discharge Condition: Stable and improved.  Diet recommendation: Soft diet.  Filed Weights   12/01/15 1752 12/02/15 1233  Weight: 68.176 kg (150 lb 4.8 oz) 68.04 kg (150 lb)    History of present illness:  [name] is a 50 y.o. female with medical history significant of recently diagnosed Crohn's disease, patient reported that she had a long history of bowel symptoms, and she was recently diagnosed with Crohn's. She was presented to the ED on the 5/5 and diagnosed with UTI, she was discharged on Cipro and Flagyl, she reported that since Saturday she quit taking medications because she couldn't tolerate them because of extreme nausea and vomiting. Last time she vomited was Saturday, but she acknowledged nausea and poor oral intake. She denied diarrhea, denies weight loss. ED Course: NAD potassium is 2.9, WBC 13.6, urinalysis again notable for TNTC WBC and rare bacteriuria. CT scan from 5/5 showed abnormal terminal ileum with wall thickening consistent with Crohn's disease. No colonic involvement.  Hospital Course:  #1 Crohn's disease flare Patient presenting with abdominal pain, nausea  vomiting and poor oral intake. CT of the abdomen and pelvis done on 11/20/2015 with grossly abnormal loops of distal and terminal ileum with wall thickening and mucosal enhancement suggestive of Crohn's disease. Clearly reactive free fluid in the pelvis. Possible sacroiliitis. Patient with improvement of lower abdominal pain and nausea. Patient status post colonoscopy with findings consistent with Crohn's disease. Biopsies pending. Patient was maintained on IV steroids initially on IV Cipro Floxin IV Flagyl. Patient was seen in consultation by gastroenterology followed the patient throughout the hospitalization and perform patient's colonoscopy. IV antibiotics of Flagyl was subsequently discontinued. Patient was maintained on IV steroids with clinical improvement. IV Flagyl subsequently discontinued. IV steroids will be transitioned to oral prednisone 60 mg daily with outpatient follow-up with GI.   #2 UTI Urine cultures with multiple morphotypes. Patient received 3 days of IV ciprofloxacin. No further antibiotics needed.   #3 hypokalemia Likely secondary to GI losses. Repleted. Follow.  #4 gastroesophageal reflux disease PPI.  #5 hypertension Patient was maintained on home regimen Lopressor. Patient's lisinopril was increased to 40 mg daily for better blood pressure control. Outpatient follow-up.    Procedures:  Acute abdominal series 12/01/2015  Colonoscopy 12/02/2015 per Dr. Loletha Carrow III  Consultations:  Gastroenterology:Dr Danis III 12/01/2015  Discharge Exam: Filed Vitals:   12/05/15 0502 12/05/15 1022  BP: 146/84 152/87  Pulse: 50   Temp: 97.9 F (36.6 C)   Resp: 14     General: NAD Cardiovascular: RRR Respiratory: CTAB Abdomen: Soft, nondistended, positive bowel sounds, less tenderness to palpation in the right lower quadrant.  Discharge Instructions   Discharge Instructions    Diet general    Complete by:  As directed   Soft diet.     Discharge instructions     Complete by:  As directed   Follow up with EDWARDS, MICHELLE P, NP as scheduled. Follow up with Gastroenterology. Office will call with appointment time.     Increase activity slowly    Complete by:  As directed           Discharge Medication List as of 12/05/2015  1:30 PM    CONTINUE these medications which have CHANGED   Details  HYDROcodone-acetaminophen (NORCO) 5-325 MG tablet Take 1-2 tablets by mouth every 6 (six) hours as needed for moderate pain or severe pain., Starting 12/05/2015, Until Discontinued, Print    lisinopril (PRINIVIL,ZESTRIL) 40 MG tablet Take 1 tablet (40 mg total) by mouth every morning., Starting 12/05/2015, Until Discontinued, Print    ondansetron (ZOFRAN) 4 MG tablet Take 1 tablet (4 mg total) by mouth every 8 (eight) hours as needed for nausea or vomiting., Starting 12/05/2015, Until Discontinued, Print    predniSONE (DELTASONE) 20 MG tablet Take 3 tablets (60 mg total) by mouth daily with breakfast., Starting 12/05/2015, Until Discontinued, Print      CONTINUE these medications which have NOT CHANGED   Details  dicyclomine (BENTYL) 20 MG tablet Take 20 mg by mouth 3 (three) times daily as needed (for abdominal pain.). , Starting 09/14/2015, Until Discontinued, Historical Med    metoprolol tartrate (LOPRESSOR) 25 MG tablet Take 50 mg by mouth daily. , Starting 09/19/2015, Until Discontinued, Historical Med    naproxen sodium (ANAPROX) 220 MG tablet Take 220 mg by mouth every 6 (six) hours as needed (for pain.)., Until Discontinued, Historical Med    pantoprazole (PROTONIX) 40 MG tablet Take 40 mg by mouth daily., Starting 10/27/2015, Until Discontinued, Historical Med    promethazine (PHENERGAN) 25 MG tablet Take 1 tablet (25 mg total) by mouth every 8 (eight) hours as needed for nausea or vomiting., Starting 11/21/2015, Until Discontinued, Print      STOP taking these medications     ciprofloxacin (CIPRO) 500 MG tablet      metroNIDAZOLE (FLAGYL) 500 MG  tablet      Na Sulfate-K Sulfate-Mg Sulf SOLN        No Known Allergies Follow-up Information    Follow up with EDWARDS, [place], NP.   Specialty:  Internal Medicine   Why:  f/u as scheduled.   Contact information:   [place] [address] [phone]       Follow up with [name], MD. Schedule an appointment as soon as possible for a visit in 1 week.   Specialty:  Gastroenterology   Why:  office will call with appointment time next week.   Contact information:   [place] [address] [phone]        The results of significant diagnostics from this hospitalization (including imaging, microbiology, ancillary and laboratory) are listed below for reference.    Significant Diagnostic Studies: Ct Abdomen Pelvis W Contrast  11/20/2015  CLINICAL DATA:  Lower abdominal pain for 2 months. EXAM: CT ABDOMEN AND PELVIS WITH CONTRAST TECHNIQUE: Multidetector CT imaging of the abdomen and pelvis was performed using the standard protocol following bolus administration of intravenous contrast. CONTRAST:  170m ISOVUE-300 IOPAMIDOL (ISOVUE-300) INJECTION 61% COMPARISON:  October 12, 2015 FINDINGS: Normal lung bases. No free air. Focal fatty deposition adjacent to the falciform ligament, unchanged. The liver, gallbladder, and portal vein are otherwise normal. The spleen,  adrenal glands, and pancreas are within normal limits. The kidneys are stable with no acute findings. Mild atherosclerotic change in the non aneurysmal aorta. No adenopathy. The stomach is normal in appearance. The jejunum is also unremarkable. The terminal and distal ileum are again markedly abnormal with wall thickening and mucosal enhancement. Evaluation of this region is limited as the contrast does not fill the distal ileum. No definitive extraluminal gas is identified. There is fluid in the pelvis. Some of the fluid is free. Several loops of bowel are fluid filled. There are some  pockets of fluid which are more rounded and focal. An oval collection of fluid in the right pelvis measuring 5 x 2.7 cm on image 72 and is thought to be outside of the bowel. A rounded region of fluid adjacent to the superior left uterus could represent another collection of fluid. I believe this is probably separate from the left ovary. The colon is normal in appearance with no inflammation. The appendix is not discretely seen. No adenopathy or aneurysm. The pelvis demonstrates multiple loops of abnormal distal small bowel, free fluid, and possible developing fluid collections as described above. The bladder is unremarkable. No adenopathy. Sclerosis and irregularity of the SI joints, right greater than left, could represent sacroiliitis in this patient with probable inflammatory bowel disease. Visualized bones are otherwise unremarkable. Delayed images demonstrate no filling defects in the upper renal collecting systems. IMPRESSION: 1. Grossly abnormal loops of distal and terminal ileum with wall thickening and mucosal enhancement suggests Crohn's disease. There is clearly reactive free fluid in the pelvis. There may also be developing collections of fluid. Evaluation of the distal small bowel and the potential developing collections of fluid would be more specific with contrast in the distal small bowel. No colonic involvement identified. 2. Possible sacroiliitis. Electronically Signed   By: Dorise Bullion III M.D   On: 11/20/2015 23:02   Dg Abd Acute W/chest  12/01/2015  CLINICAL DATA:  50 year old female with vomiting and generalized abdominal pain EXAM: DG ABDOMEN ACUTE W/ 1V CHEST COMPARISON:  Abdominal CT dated 11/20/2015 FINDINGS: There is no evidence of dilated bowel loops or free intraperitoneal air. No radiopaque calculi or other significant radiographic abnormality is seen. Heart size and mediastinal contours are within normal limits. Both lungs are clear. IMPRESSION: Negative abdominal radiographs.   No acute cardiopulmonary disease. Electronically Signed   By: Anner Crete M.D.   On: 12/01/2015 18:04    Microbiology: Recent Results (from the past 240 hour(s))  Culture, Urine     Status: Abnormal   Collection Time: 12/01/15  3:11 PM  Result Value Ref Range Status   Specimen Description URINE, CLEAN CATCH  Final   Special Requests NONE  Final   Culture MULTIPLE SPECIES PRESENT, SUGGEST RECOLLECTION (A)  Final   Report Status 12/03/2015 FINAL  Final     Labs: Basic Metabolic Panel:  Recent Labs Lab 12/01/15 1410 12/01/15 1415 12/02/15 0343 12/03/15 0346 12/04/15 0333 12/05/15 0342  NA  --  138 137 137 134* 135  K  --  2.9* 4.6 3.4* 4.6 4.5  CL  --  98* 101 104 102 102  CO2  --  31 28 28 28 27   GLUCOSE  --  89 132* 101* 127* 128*  BUN  --  9 <5* <5* <5* 11  CREATININE  --  0.71 0.68 0.68 0.64 0.80  CALCIUM  --  9.0 9.1 8.6* 8.9 8.9  MG 1.9  --   --  1.8  --   --  Liver Function Tests:  Recent Labs Lab 12/01/15 1415  AST 12*  ALT 9*  ALKPHOS 60  BILITOT 0.4  PROT 7.1  ALBUMIN 2.9*    Recent Labs Lab 12/01/15 1415  LIPASE 12   No results for input(s): AMMONIA in the last 168 hours. CBC:  Recent Labs Lab 12/01/15 1415 12/02/15 0343 12/03/15 0346 12/04/15 0333 12/05/15 0342  WBC 13.6* 11.5* 10.5 10.9* 13.6*  HGB 10.3* 10.4* 9.1* 9.4* 10.0*  HCT 33.2* 34.1* 28.9* 30.5* 32.6*  MCV 74.8* 75.3* 75.3* 73.5* 74.9*  PLT 531* 589* 446* 471* 502*   Cardiac Enzymes: No results for input(s): CKTOTAL, CKMB, CKMBINDEX, TROPONINI in the last 168 hours. BNP: BNP (last 3 results) No results for input(s): BNP in the last 8760 hours.  ProBNP (last 3 results) No results for input(s): PROBNP in the last 8760 hours.  CBG: No results for input(s): GLUCAP in the last 168 hours.     SignedIrine Seal MD.  [facility] 12/05/2015, 6:41 PM

## 2015-12-05 NOTE — Progress Notes (Signed)
Patient ID: Marissa Blankenship, female   DOB: Apr 19, 1966, 50 y.o.   MRN: [id number]   Continues to progress well, feels she can manage at home Meds as per Dr Loletha Carrow 's note from yesterday We will arrange  quick office follow up

## 2015-12-06 LAB — QUANTIFERON IN TUBE
QUANTIFERON MITOGEN VALUE: 0.03 [IU]/mL
QUANTIFERON TB AG VALUE: 0.02 [IU]/mL
QUANTIFERON TB GOLD: UNDETERMINED
Quantiferon Nil Value: 0.03 IU/mL

## 2015-12-06 LAB — QUANTIFERON TB GOLD ASSAY (BLOOD)

## 2015-12-07 ENCOUNTER — Telehealth: Payer: Self-pay

## 2015-12-07 NOTE — Telephone Encounter (Signed)
12/24/15 830 am appt with Dr Loletha Carrow, letter mailed.

## 2015-12-07 NOTE — Telephone Encounter (Signed)
-----   Message from Marissa Ferguson, PA-C sent at 12/05/2015  8:37 AM EDT ----- Regarding: office appt Pt being discharged from hospital today - please get her an appt with Dr Loletha Carrow in 2 weeks- if he cannot see I will be happy to see

## 2015-12-10 ENCOUNTER — Telehealth: Payer: Self-pay | Admitting: Gastroenterology

## 2015-12-10 NOTE — Telephone Encounter (Signed)
Please contact Ms Dodgen and ask her to  Come see me next tues the 30th at 1:15  She is a follow up for recent Crohn's hospitalization. She went home on prednisone 60 mg once a day.  Please have her decrease that to 50 mg once a day on this coming Saturday.

## 2015-12-11 NOTE — Telephone Encounter (Signed)
Pt contacted. She agrees to come to the clinic on 12-15-15 at 1:15pm. She also understands that she is to start 50 mg of prednisone on Saturday 12-11-2015.

## 2015-12-15 ENCOUNTER — Ambulatory Visit (INDEPENDENT_AMBULATORY_CARE_PROVIDER_SITE_OTHER): Payer: Medicaid Other | Admitting: Gastroenterology

## 2015-12-15 ENCOUNTER — Encounter: Payer: Self-pay | Admitting: Gastroenterology

## 2015-12-15 VITALS — BP 118/74 | HR 62 | Ht 64.0 in | Wt 153.0 lb

## 2015-12-15 DIAGNOSIS — R1031 Right lower quadrant pain: Secondary | ICD-10-CM | POA: Diagnosis not present

## 2015-12-15 DIAGNOSIS — K5000 Crohn's disease of small intestine without complications: Secondary | ICD-10-CM

## 2015-12-15 MED ORDER — PREDNISONE 10 MG PO TABS: 40.0000 mg | ORAL_TABLET | Freq: Every day | ORAL | Status: DC

## 2015-12-15 MED ORDER — ACETAMINOPHEN-CODEINE #3 300-30 MG PO TABS: 1.0000 | ORAL_TABLET | Freq: Four times a day (QID) | ORAL | Status: DC | PRN

## 2015-12-15 MED ORDER — DICYCLOMINE HCL 20 MG PO TABS: 20.0000 mg | ORAL_TABLET | Freq: Three times a day (TID) | ORAL | Status: DC | PRN

## 2015-12-15 MED ORDER — METOPROLOL TARTRATE 25 MG PO TABS: 50.0000 mg | ORAL_TABLET | Freq: Every day | ORAL | Status: DC

## 2015-12-15 NOTE — Progress Notes (Signed)
GI Progress Note  Chief Complaint: Crohn's ileitis  Subjective History:  Marissa Blankenship follows up for her small bowel Crohn's disease. See my recent office consult followed by hospital consult and colonoscopy report. Symptoms and imaging and colonoscopy were all consistent with Crohn's disease, biopsies looked like IBD. The terminal ileum could not be deeply intubated due to the severe inflammation of the ICV, but there was also a small fistulous opening from the small bowel to the IC valve. She is feeling much better on prednisone 50 mg once daily, which she has been on for about a week. Her diarrhea has resolved, she typically has a BM every other day now. Bentyl seems to be helping with the cramps. She was using Naprosyn during the day because sometimes the Vicodin makes her sleepy and she has to care for her son. She was instructed to do this with her hospital discharge papers. She is also out of her Lopressor, which was increased from 25-50 mg in the hospital. She was previously getting medicine from her gynecologist, and I prescribed a refill for her today until she can get back in with them soon. Her appetite is been good, in fact it is increased significantly on the prednisone, her weight has increased as a result. Her nausea and vomiting have stopped. She has no fever or dysuria.  ROS: Cardiovascular:  no chest pain Respiratory: no dyspnea  The patient's Past Medical, Family and Social History were reviewed and are on file in the EMR.  Objective:  Med list reviewed  Vital signs in last 24 hrs: Filed Vitals:   12/15/15 1327  BP: 118/74  Pulse: 62    Physical Exam She looks much healthier than the last time I saw her. She is no longer in pain, and her affect is brighter.  HEENT: sclera anicteric, oral mucosa moist without lesions  Neck: supple, no thyromegaly, JVD or lymphadenopathy  Cardiac: RRR without murmurs, S1S2 heard, no peripheral edema  Pulm: clear to auscultation  bilaterally, normal RR and effort noted  Abdomen: soft, minimal RLQ tenderness, with active bowel sounds. No guarding or palpable hepatosplenomegaly.  Skin; warm and dry, no jaundice or rash  Recent Labs:  TB QuantiFERON Gold was indeterminate due to the low control value since the patient was on prednisone. Acute hepatitis panel was done in the hospital. Unfortunately, this does not have the IgG hepatitis B or A antibody.   @ASSESSMENTPLANBEGIN @ Assessment: Encounter Diagnoses  Name Primary?  . Crohn's disease of small intestine without complication ([place]) Yes  . RLQ abdominal pain    She has severe small bowel Crohn's disease with an internal fistula. She is much improved on prednisone, but needs maintenance medicine. I think she will be best served on anti-TNF therapy. When he eventually wean her off prednisone, I will most likely had combo immunosuppressive therapy with Imuran or 6-MP. She received a pneumococcal 23 vaccine during recent hospital stay, so she will need the prep and are 13 and about 6 months. She received a recent flu shot, needs one annually. We will need to get hepatitis a and B IgG antibodies to see if she requires Twinrix.  We had a long discussion about the nature Crohn's disease, I gave her some written information and also directed her to the [employer] website. We discussed the nature of immunosuppressive therapy, the risks and benefits including infection and long-term risk of lymphoma. QUESTIONS were answered and she is agreeable to this plan of therapy.  Plan: Hepatitis A and  B serologies Work on approval for Remicade or Humira. I think Humira would fit into her lifestyle better, since she has a young child to care for. Taper prednisone as directed over the next 6 weeks Calcium and vitamin D supplement while on prednisone As needed use of Bentyl I refilled her Lopressor for 1 month as noted above I prescribed 60 tablets of Tylenol with codeine instead of  Vicodin.  She knows not to use them both, and is nearly out of the Vicodin.  Total 35 minute visit, over half spent reviewing her condition counseling and cord needing care as noted above.   Marissa Blankenship

## 2015-12-15 NOTE — Patient Instructions (Addendum)
If you are age 48 or older, your body mass index should be between 23-30. Your Body mass index is 26.25 kg/(m^2). If this is out of the aforementioned range listed, please consider follow up with your Primary Care Provider.  If you are age 52 or younger, your body mass index should be between 19-25. Your Body mass index is 26.25 kg/(m^2). If this is out of the aformentioned range listed, please consider follow up with your Primary Care Provider.   Prednisone taper:  Decrease dose to 40 mg once daily for 1 week, Then 30 mg once daily for 1 week, then 20 mg once daily for 2 weeks, then 10 mg once daily for 1 week, Then 5 mg once daily for 1 week, then stop  Take two over the counter calcium (500 or 600 mg) plus Vitamin D tablets once a day while you are on Prednisone.  We will start working on approval for your new medication and then call you with the further plan.  Thank you for choosing [facility]  Dr Wilfrid Lund III

## 2015-12-18 ENCOUNTER — Other Ambulatory Visit: Payer: Self-pay

## 2015-12-18 DIAGNOSIS — K50919 Crohn's disease, unspecified, with unspecified complications: Secondary | ICD-10-CM

## 2015-12-18 MED ORDER — ADALIMUMAB 40 MG/0.8ML ~~LOC~~ PSKT: 80.0000 mg | PREFILLED_SYRINGE | Freq: Once | SUBCUTANEOUS | Status: DC

## 2015-12-18 MED ORDER — ADALIMUMAB 40 MG/0.8ML ~~LOC~~ PSKT: 40.0000 mg | PREFILLED_SYRINGE | SUBCUTANEOUS | Status: DC

## 2015-12-18 MED ORDER — ADALIMUMAB 40 MG/0.8ML ~~LOC~~ AJKT: 160.0000 mg | AUTO-INJECTOR | Freq: Once | SUBCUTANEOUS | Status: DC

## 2015-12-21 ENCOUNTER — Telehealth: Payer: Self-pay | Admitting: Gastroenterology

## 2015-12-21 NOTE — Telephone Encounter (Signed)
Caller name: Neda Relation to pt: [facility]  Call back number:  [phone]   FAX# [phone]   Reason for call:   Needs office notes for Humira, needs insurance info for patient, and needs clarification on the actual scripts for Humira

## 2015-12-22 NOTE — Telephone Encounter (Signed)
Records and insurance information has been faxed to [place] [facility] to [phone]

## 2015-12-22 NOTE — Telephone Encounter (Signed)
No answer will call later

## 2015-12-23 NOTE — Telephone Encounter (Signed)
[facility] has been given the correct dosage for induction and maintenance.  They will call with any further questions or concerns.

## 2015-12-23 NOTE — Telephone Encounter (Signed)
[facility] is needing clarification on the dosage and instructions.  Please call [phone]

## 2015-12-24 ENCOUNTER — Ambulatory Visit: Payer: Medicaid Other | Admitting: Gastroenterology

## 2015-12-30 ENCOUNTER — Telehealth: Payer: Self-pay | Admitting: Gastroenterology

## 2015-12-30 NOTE — Telephone Encounter (Signed)
Pt contacted. She sates she is still having some abdominal pain, its not as severe, and has improved. She is still on her Prednisone and tapering as directed. Her current dose is 30 mg a day. She states the pain is worse at night into the morning. Pt requesting refills of Tylenol #3. Please advise

## 2015-12-31 ENCOUNTER — Telehealth: Payer: Self-pay | Admitting: Gastroenterology

## 2015-12-31 NOTE — Telephone Encounter (Signed)
The order was corrected and verified.

## 2016-01-01 ENCOUNTER — Other Ambulatory Visit: Payer: Self-pay | Admitting: Gastroenterology

## 2016-01-01 ENCOUNTER — Other Ambulatory Visit: Payer: Self-pay

## 2016-01-01 MED ORDER — ACETAMINOPHEN-CODEINE #3 300-30 MG PO TABS: 1.0000 | ORAL_TABLET | Freq: Four times a day (QID) | ORAL | Status: DC | PRN

## 2016-01-01 NOTE — Telephone Encounter (Signed)
I will prescribe tylenol #3 for her. Please print the script I wrote for signature today.  Then place in envelope at front desk and she can pick it up at her convenience

## 2016-01-04 NOTE — Telephone Encounter (Signed)
RX was printed on Friday and pt came to pick up.

## 2016-01-09 ENCOUNTER — Telehealth: Payer: Self-pay | Admitting: Internal Medicine

## 2016-01-09 NOTE — Telephone Encounter (Signed)
Patient of Dr. Loletha Carrow' who contacted me, the on-call physician, at 5:30 AM this morning She reports that she has been tapering prednisone after being recently diagnosed with Crohn's disease. Review of her records reveal CT scan and colonoscopy in May 2017 revealing Crohn's ileitis and right-sided colitis. It was also noted that she was having chronic diarrhea. She is been tapering from prednisone 60 mg daily and was scheduled to go to 10 mg daily today. She feels like her abdominal pain has worsened and she has tapered prednisone She reports that she now feels constipated which is leading to worsening abdominal discomfort. She has been able to eat and is passing flatus. She denies fever, but has noticed some chills. She has not been able to have a bowel movement in nearly 48 hours, which is atypical for her.  I recommended that if her pain worsens or should she develop fever, nausea with vomiting that she should go to the ER over the weekend. She voiced understanding In the interim I recommended that she remain at prednisone 20 mg without tapering further for now. Add MiraLAX 17 g twice today then daily thereafter to combat constipation Call me back with any questions or concerns over the weekend I will forward this to Dr. Loletha Carrow

## 2016-01-11 NOTE — Telephone Encounter (Signed)
The pt will continue her prednisone 30 mg and she also states that she has received the humira and has called to make an appt for the humira ambassador to come out and teach her how to inject the medication.  How should she taper her prednisone once she begins the humira?  She is waiting on the call to find out when the rep will be at her home.

## 2016-01-11 NOTE — Telephone Encounter (Signed)
Marissa Blankenship,    Please have her stay on prednisone 30 mg once daily (if she needs a new Rx, send one to pharmacy for month supply.) Also, at last visit we were working on approval for Humira or Remicade.  I have not heard the outcome - how are we doing with that.  She needs to start anti-TNF therapy soon so we can get start tapering prednisone.

## 2016-01-11 NOTE — Telephone Encounter (Signed)
One week after starting Humira, decrease prednisone to 20 mg once daily and see me a week after that

## 2016-01-12 ENCOUNTER — Telehealth: Payer: Self-pay | Admitting: Gastroenterology

## 2016-01-12 NOTE — Telephone Encounter (Signed)
It is too soon for a refill. She can take 2 tablets at bedtime for pain.  This Friday she can pick up another script for 30 tablets

## 2016-01-12 NOTE — Telephone Encounter (Signed)
Pt is requesting more Tylenol #3. Last refill given on 01-01-2016 #30. Hydrocodone given by Dr Grandville Silos on 12-05-2015. Please advise.

## 2016-01-13 NOTE — Telephone Encounter (Signed)
Pt notified and she states understanding. Pt will call back on Friday for refills.

## 2016-01-14 ENCOUNTER — Ambulatory Visit: Payer: Medicaid Other | Admitting: Gastroenterology

## 2016-01-15 MED ORDER — ACETAMINOPHEN-CODEINE #3 300-30 MG PO TABS: 1.0000 | ORAL_TABLET | Freq: Four times a day (QID) | ORAL | Status: DC | PRN

## 2016-01-15 NOTE — Telephone Encounter (Signed)
Patient is requesting refills. Best # [phone]

## 2016-01-15 NOTE — Addendum Note (Signed)
Addended by: Elias Else on: 01/15/2016 11:17 AM   Modules accepted: Orders

## 2016-01-15 NOTE — Telephone Encounter (Signed)
RX printed for Dr Loletha Carrow to sign. Pt notified and aware.

## 2016-01-25 ENCOUNTER — Telehealth: Payer: Self-pay | Admitting: Gastroenterology

## 2016-01-25 NOTE — Telephone Encounter (Signed)
The pt has been notified and will hold humira until next week and she will see PCP if no better in 2-3 days

## 2016-01-25 NOTE — Telephone Encounter (Signed)
No fever, runny nose, slight cough x 4 days.  Has humira this week and wants to be sure she can still have it.  Please advise

## 2016-01-25 NOTE — Telephone Encounter (Signed)
Please have her hold Humira until next week.  If her respiratory symptoms are not feeling better in 2-3 days, please go to primary care or urgent care.

## 2016-02-17 ENCOUNTER — Encounter: Payer: Self-pay | Admitting: Gastroenterology

## 2016-02-17 ENCOUNTER — Ambulatory Visit: Payer: Medicaid Other | Admitting: Gastroenterology

## 2016-02-17 ENCOUNTER — Ambulatory Visit (INDEPENDENT_AMBULATORY_CARE_PROVIDER_SITE_OTHER): Payer: Medicaid Other | Admitting: Gastroenterology

## 2016-02-17 VITALS — BP 102/76 | HR 62 | Ht 64.0 in | Wt 143.0 lb

## 2016-02-17 DIAGNOSIS — R112 Nausea with vomiting, unspecified: Secondary | ICD-10-CM

## 2016-02-17 DIAGNOSIS — K5000 Crohn's disease of small intestine without complications: Secondary | ICD-10-CM | POA: Diagnosis not present

## 2016-02-17 DIAGNOSIS — R1031 Right lower quadrant pain: Secondary | ICD-10-CM

## 2016-02-17 DIAGNOSIS — Z23 Encounter for immunization: Secondary | ICD-10-CM

## 2016-02-17 MED ORDER — ACETAMINOPHEN-CODEINE #3 300-30 MG PO TABS: 1.0000 | ORAL_TABLET | Freq: Four times a day (QID) | ORAL | 1 refills | Status: DC | PRN

## 2016-02-17 NOTE — Patient Instructions (Addendum)
Prednisone taper:  Decrease to 20 mg once daily for the next 10 days, then 10 mg once daily for 7 days, then 5 mg once daily for 7 days, then stop.  Take your second dose of Humira  (2 pens) today or tomorrow.  Call for the Humira refills.  Two weeks from now you will use one pen, then continue to use one pen every other week.  We will give you a Hepatitis A and B vaccine today.  Pain medicine was ordered.  Use this just in the evening.  Your physician has requested that you go to the basement for the following lab work before leaving today: CBC,Hepatic and TPMP  See me in about 6 weeks ( 03-29-2016)  If you are age 72 or older, your body mass index should be between 23-30. Your Body mass index is 24.55 kg/m. If this is out of the aforementioned range listed, please consider follow up with your Primary Care Provider.  If you are age 10 or younger, your body mass index should be between 19-25. Your Body mass index is 24.55 kg/m. If this is out of the aformentioned range listed, please consider follow up with your Primary Care Provider.   Thank you for choosing [facility]  Dr Wilfrid Lund III

## 2016-02-17 NOTE — Progress Notes (Signed)
[facility] Progress Note  Chief Complaint: Crohn's ileitis and abdominal pain  Subjective  History:  Marissa Blankenship follows up for her Crohn's ileitis diagnosed in early May of this year. We get approval for Humira, which she started at the end of June. She was due for her second dose 2 weeks later, but called our office because she had congestion and a runny nose for a few days and was uncertain if she should take her Humira. We advised her not to take the Humira until she was seen by primary care, which she did one or 2 days later. We do not have those records, but she says no testing was done on the first visit, and she returned a week later and had some blood work done. She only got a call from them about a week ago saying he suspected she had a viral infection. More portly, the patient's symptoms only lasted for 5 days and have not come back. She has no further congestion runny nose my never had a fever. There was a dry cough for 2 or 3 days that also resolved and has not recurred. She reports having improvement in her abdominal pain and nausea after the first dose of Humira, but that effect has waned since his now been just over a month since that dose. She has right lower quadrant pain that, as before, seems to come on more in the late afternoon and early evening. Her appetite is been good and her weight stable. She was having persistent symptoms for which he gets the Humira started, so she was back on prednisone 30 mg a day and has remained on that since late June.  ROS: Cardiovascular:  no chest pain Respiratory: no dyspnea  The patient's Past Medical, Family and Social History were reviewed and are on file in the EMR.  Objective:  Med list reviewed  Vital signs in last 24 hrs: Vitals:   02/17/16 1610  BP: 102/76  Pulse: 62    Physical Exam  She is well-appearing but fatigued  HEENT: sclera anicteric, oral mucosa moist without lesions  Neck: supple, no thyromegaly, JVD or  lymphadenopathy  Cardiac: RRR without murmurs, S1S2 heard, no peripheral edema  Pulm: clear to auscultation bilaterally, normal RR and effort noted  Abdomen: soft, mild RLQ tenderness, with active bowel sounds. No guarding or palpable hepatosplenomegaly.  Skin; warm and dry, no jaundice or rash  Recent Labs:  Hepatitis A and B serologies were negative    @ASSESSMENTPLANBEGIN @ Assessment: Encounter Diagnoses  Name Primary?  . Crohn's disease of small intestine without complication ([place]) Yes  . RLQ abdominal pain   . 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 The terminal ileum could not be intubated due to the degree of inflammation. She has persistent symptoms because she has not yet finished induction therapy with Humira.   Plan: She was given the first dose of Twinrix today, then continue dosing on the standard schedule LFTs, CBC differential, TPMT enzyme activity in anticipation of eventually adding 6-MP. She was given a prescription for Tylenol No. 3, 45 tablets with 1 refill to take evening. She is again instructed not to take it while driving. Taper prednisone to 20 mg a day for 10 days, then 10 mg a day for 10 days, then 5 mg a day for 10 days and then stop. She will need her  Prevnar 13 vaccination November or December of this year. See me in about 6 weeks, call sooner as needed  Total time 45 minutes, over half spent in counseling and coordination of care.  Topics discussed: Long-term management of Crohn's disease risks and benefits of immunosuppression and appropriate use of narcotic pain medication per  [name]

## 2016-02-29 ENCOUNTER — Other Ambulatory Visit (INDEPENDENT_AMBULATORY_CARE_PROVIDER_SITE_OTHER): Payer: Medicaid Other

## 2016-02-29 DIAGNOSIS — K5000 Crohn's disease of small intestine without complications: Secondary | ICD-10-CM

## 2016-02-29 DIAGNOSIS — R112 Nausea with vomiting, unspecified: Secondary | ICD-10-CM

## 2016-02-29 DIAGNOSIS — R1031 Right lower quadrant pain: Secondary | ICD-10-CM | POA: Diagnosis not present

## 2016-02-29 LAB — HEPATIC FUNCTION PANEL
ALT: 5 U/L (ref 0–35)
AST: 7 U/L (ref 0–37)
Albumin: 3.7 g/dL (ref 3.5–5.2)
Alkaline Phosphatase: 48 U/L (ref 39–117)
BILIRUBIN DIRECT: 0 mg/dL (ref 0.0–0.3)
BILIRUBIN TOTAL: 0.2 mg/dL (ref 0.2–1.2)
Total Protein: 7 g/dL (ref 6.0–8.3)

## 2016-02-29 LAB — CBC WITH DIFFERENTIAL/PLATELET
BASOS PCT: 0.3 % (ref 0.0–3.0)
Basophils Absolute: 0 10*3/uL (ref 0.0–0.1)
EOS ABS: 0.1 10*3/uL (ref 0.0–0.7)
EOS PCT: 0.4 % (ref 0.0–5.0)
HCT: 33.2 % — ABNORMAL LOW (ref 36.0–46.0)
Hemoglobin: 10.6 g/dL — ABNORMAL LOW (ref 12.0–15.0)
LYMPHS ABS: 0.8 10*3/uL (ref 0.7–4.0)
Lymphocytes Relative: 5.7 % — ABNORMAL LOW (ref 12.0–46.0)
MCHC: 32 g/dL (ref 30.0–36.0)
MCV: 78.8 fl (ref 78.0–100.0)
MONO ABS: 0.5 10*3/uL (ref 0.1–1.0)
Monocytes Relative: 3.2 % (ref 3.0–12.0)
Neutro Abs: 13.2 10*3/uL — ABNORMAL HIGH (ref 1.4–7.7)
PLATELETS: 412 10*3/uL — AB (ref 150.0–400.0)
RBC: 4.21 Mil/uL (ref 3.87–5.11)
RDW: 19.3 % — AB (ref 11.5–15.5)
WBC: 14.6 10*3/uL — AB (ref 4.0–10.5)

## 2016-03-05 LAB — THIOPURINE METHYLTRANSFERASE (TPMT), RBC: THIOPURINE METHYLTRANSFERASE, RBC: 17 nmol/h/mL

## 2016-03-07 ENCOUNTER — Telehealth: Payer: Self-pay | Admitting: Gastroenterology

## 2016-03-07 NOTE — Telephone Encounter (Signed)
Patient reports that she misplaced her AVS from her last office visit.  She took 80 mg of Humira when she should have only taken 40 mg.  She also was on 10 mg of prednisone for 14 days instead of 7 days per instructions.  We have a sample we can provide her for her next injection if you want her to continue with 40 mg in 2 weeks.  Please advise when you want next Humira dose

## 2016-03-08 NOTE — Telephone Encounter (Signed)
Patient notified She is aware to come pick up a sample of Humira to replace her dose that will be due in 2 weeks.  She will call back for any additional questions or concerns.

## 2016-03-08 NOTE — Telephone Encounter (Signed)
It will be OK.  Humira 40 mg every other week, starting two weeks after her most recent dose.  If she is done taking prednisone, great. If she is still on prednisone 10 mg daily, then decrease to 5 mg (which is half a tab) once daily for a week, then stop.

## 2016-03-16 ENCOUNTER — Telehealth: Payer: Self-pay | Admitting: Gastroenterology

## 2016-03-16 DIAGNOSIS — K50119 Crohn's disease of large intestine with unspecified complications: Secondary | ICD-10-CM

## 2016-03-16 NOTE — Telephone Encounter (Signed)
I would like to try hard not to put her back on prednisone if we can avoid it since she had been on it for months.  Yes, I want to her to take the Humira even though she has abdominal pain because we need to get the Crohn's under control.  In fact, I would like to see if the Humira rep can get [place] two samples in order to allow [place] to use one 40 mg pen every week for the next 4 weeks rather than every other week.  In addition, I would like to start Azathioprine 100 mg once daily.  She must be certain to use birth control while on these immune suppressive meds.  Since new med being started, need to check CBC/diff and hepatic function panel in 7-10 days.  I can prescribe more pain medicine if she continues to use it sparingly.  Would have to come to office to pick up prescription since it is narcotic and cannot be called in.

## 2016-03-16 NOTE — Telephone Encounter (Signed)
Pt  finished prednisone on Saturday, pain has increased since stopping prednisone.   Has been taking Humira as directed.  She continues to has upper abd pain, some back pain.  Pt has been taking Tylenol #3 during the day for the pain.  Please advise

## 2016-03-17 MED ORDER — AZATHIOPRINE 100 MG PO TABS: 100.0000 mg | ORAL_TABLET | Freq: Every day | ORAL | 0 refills | Status: AC

## 2016-03-17 NOTE — Telephone Encounter (Signed)
Patient returned phone call. Best # [phone]

## 2016-03-17 NOTE — Telephone Encounter (Signed)
Left message on machine to call back  

## 2016-03-17 NOTE — Telephone Encounter (Signed)
Pt was advised of the recommendations to increase humira to every week for 4 weeks and repeat labs in 7-10 days.  She is aware that azathioprine has also been sent to the pharmacy. Two sample humira pens are in the side A refrigerator for the pt to pick up.

## 2016-03-22 ENCOUNTER — Ambulatory Visit (INDEPENDENT_AMBULATORY_CARE_PROVIDER_SITE_OTHER): Payer: Medicaid Other | Admitting: Gastroenterology

## 2016-03-22 DIAGNOSIS — Z23 Encounter for immunization: Secondary | ICD-10-CM | POA: Diagnosis not present

## 2016-03-29 ENCOUNTER — Ambulatory Visit (INDEPENDENT_AMBULATORY_CARE_PROVIDER_SITE_OTHER): Payer: Medicaid Other | Admitting: Gastroenterology

## 2016-03-29 ENCOUNTER — Encounter: Payer: Self-pay | Admitting: Gastroenterology

## 2016-03-29 VITALS — BP 132/84 | HR 60 | Ht 64.0 in | Wt 136.6 lb

## 2016-03-29 DIAGNOSIS — R634 Abnormal weight loss: Secondary | ICD-10-CM | POA: Diagnosis not present

## 2016-03-29 DIAGNOSIS — K5000 Crohn's disease of small intestine without complications: Secondary | ICD-10-CM | POA: Diagnosis not present

## 2016-03-29 DIAGNOSIS — Z79899 Other long term (current) drug therapy: Secondary | ICD-10-CM

## 2016-03-29 DIAGNOSIS — Z7960 Long term (current) use of unspecified immunomodulators and immunosuppressants: Secondary | ICD-10-CM

## 2016-03-29 DIAGNOSIS — R1031 Right lower quadrant pain: Secondary | ICD-10-CM | POA: Diagnosis not present

## 2016-03-29 DIAGNOSIS — K50919 Crohn's disease, unspecified, with unspecified complications: Secondary | ICD-10-CM

## 2016-03-29 MED ORDER — ADALIMUMAB 40 MG/0.8ML ~~LOC~~ PSKT: 40.0000 mg | PREFILLED_SYRINGE | SUBCUTANEOUS | 3 refills | Status: DC

## 2016-03-29 MED ORDER — DICYCLOMINE HCL 20 MG PO TABS: 20.0000 mg | ORAL_TABLET | Freq: Three times a day (TID) | ORAL | 3 refills | Status: DC | PRN

## 2016-03-29 NOTE — Patient Instructions (Addendum)
Food Guidelines for a sensitive stomach  Many people have difficulty digesting certain foods, causing a variety of distressing and embarrassing symptoms such as abdominal pain, bloating and gas.  These foods may need to be avoided or consumed in small amounts.  Here are some tips that might be helpful for you.  1.   Lactose intolerance is the difficulty or complete inability to digest lactose, the natural sugar in milk and anything made from milk.  This condition is harmless, common, and can begin any time during life.  Some people can digest a modest amount of lactose while others cannot tolerate any.  Also, not all dairy products contain equal amounts of lactose.  For example, hard cheeses such as parmesan have less lactose than soft cheeses such as cheddar.  Yogurt has less lactose than milk or cheese.  Many packaged foods (even many brands of bread) have milk, so read ingredient lists carefully.  It is difficult to test for lactose intolerance, so just try avoiding lactose as much as possible for a week and see what happens with your symptoms.  If you seem to be lactose intolerant, the best plan is to avoid it (but make sure you get calcium from another source).  The next best thing is to use lactase enzyme supplements, available over the counter everywhere.  Just know that many lactose intolerant people need to take several tablets with each serving of dairy to avoid symptoms.  Lastly, a lot of restaurant food is made with milk or butter.  Many are things you might not suspect, such as mashed potatoes, rice and pasta (cooked with butter) and "grilled" items.  If you are lactose intolerant, it never hurts to ask your server what has milk or butter.  2.   Fiber is an important part of your diet, but not all fiber is well-tolerated.  Insoluble fiber such as bran is often consumed by normal gut bacteria and converted into gas.  Soluble fiber such as oats, squash, carrots and green beans are typically  tolerated better.  3.   Some types of carbohydrates can be poorly digested.  Examples include: fructose (apples, cherries, pears, raisins and other dried fruits), fructans (onions, zucchini, large amounts of wheat), sorbitol/mannitol/xylitol and sucralose/Splenda (common artificial sweeteners), and raffinose (lentils, broccoli, cabbage, asparagus, brussel sprouts, many types of beans).  Do a Development worker, community for [employer] and you will find helpful information. Beano, a dietary supplement, will often help with raffinose-containing foods.  As with lactase tablets, you may need several per serving.  4.   Whenever possible, avoid processed food&meats and chemical additives.  High fructose corn syrup, a common sweetener, may be difficult to digest.  Eggs and soy (comes from the soybean, and added to many foods now) are the other most common bloating/gassy foods.  We have sent the following medications to your pharmacy for you to pick up at your convenience: Humira  You have been scheduled for a CT scan of the abdomen and pelvis at [facility] ([address]---this is in the same building as Press [facility]).   You are scheduled on 04-06-2016 at [facility] should arrive 60 minutes prior to your appointment time for registration. Please follow the written instructions below on the day of your exam:  WARNING: IF YOU ARE ALLERGIC TO IODINE/X-RAY DYE, PLEASE NOTIFY RADIOLOGY IMMEDIATELY AT [facility].  1) Do not eat or drink anything after 630am (4  hours prior to your test)   You may take any medications as prescribed with a small amount of water except for the following: Metformin, Glucophage, Glucovance, Avandamet, Riomet, Fortamet, Actoplus Met, Janumet, Glumetza or Metaglip. The above medications must be held the day of the exam AND 48 hours after the exam.  The purpose of you drinking the oral contrast is to aid in the  visualization of your intestinal tract. The contrast solution may cause some diarrhea. Before your exam is started, you will be given a small amount of fluid to drink. Depending on your individual set of symptoms, you may also receive an intravenous injection of x-ray contrast/dye. Plan on being at [facility] for 30 minutes or longer, depending on the type of exam you are having performed.  This test typically takes 30-45 minutes to complete.  If you have any questions regarding your exam or if you need to reschedule, you may call the CT department at [facility] between the hours of 8:00 am and 5:00 pm, Monday-Friday.  ________________________________________________________________________  Thank you for choosing  GI  Dr Wilfrid Lund III

## 2016-03-29 NOTE — Progress Notes (Signed)
[facility] Progress Note  Chief Complaint: Crohn's ileitis and abdominal pain.  Subjective  History:  Marissa Blankenship follows up for her ileal Crohn's disease. Unfortunately, she is still not feeling very well. She has no diarrhea but at times will have no BM for about 2 days. She attributes this to decreased food intake due to abdominal pain. She has certainly lost weight over the last few months as noted below. Her abdominal pain is mostly right lower quadrant, but sometimes more generalized and bloating. It might be brief and sharp but other times feel like bloating and gas. She has not noticed clear or consistent food triggers. She is taking the Tylenol No. 3 that I prescribed almost every night and sometimes once during the day. It does not seem to make her fatigued. Her general energy level is poor. She denies rectal bleeding, mouth sores dysphagia odynophagia or vomiting. When I heard from her about 2 weeks ago with these worsening symptoms, the plan was to give her two samples of Humira so that she might take it weekly for 4 weeks, then decreasing to her usual standing dose of 63m every other week. I also started azathioprine 100 mg once daily, beginning about 10 days ago. She understands that it may take a few months to see the full effect of this. It also sounds like she has not always been able to get her Humira reliably, and we were able to figure out that she was getting it through specialty pharmacy that is not contracted with Medicaid. Therefore, we sent a prescription for it to her local pharmacy and confirmed that they will have it on hand.  ROS: Cardiovascular:  no chest pain Respiratory: no dyspnea  The patient's Past Medical, Family and Social History were reviewed and are on file in the EMR.  Objective:  Med list reviewed  Vital signs in last 24 hrs: Vitals:   03/29/16 1623  BP: 132/84  Pulse: 60   Wt 153 on 5/30 -> 143 on 8/2 -> 136 # today Physical Exam She looks fatigued  but nontoxic  HEENT: sclera anicteric, oral mucosa moist without lesions  Neck: supple, no thyromegaly, JVD or lymphadenopathy  Cardiac: RRR without murmurs, S1S2 heard, no peripheral edema  Pulm: clear to auscultation bilaterally, normal RR and effort noted  Abdomen: soft, Mild RLQ tenderness, with active bowel sounds. No guarding or palpable hepatosplenomegaly.  Skin; warm and dry, no jaundice or rash  Recent Labs:  CMP Latest Ref Rng & Units 02/29/2016 12/05/2015 12/04/2015  Glucose 65 - 99 mg/dL - 128(H) 127(H)  BUN 6 - 20 mg/dL - 11 <5(L)  Creatinine 0.44 - 1.00 mg/dL - 0.80 0.64  Sodium 135 - 145 mmol/L - 135 134(L)  Potassium 3.5 - 5.1 mmol/L - 4.5 4.6  Chloride 101 - 111 mmol/L - 102 102  CO2 22 - 32 mmol/L - 27 28  Calcium 8.9 - 10.3 mg/dL - 8.9 8.9  Total Protein 6.0 - 8.3 g/dL 7.0 - -  Total Bilirubin 0.2 - 1.2 mg/dL 0.2 - -  Alkaline Phos 39 - 117 U/L 48 - -  AST 0 - 37 U/L 7 - -  ALT 0 - 35 U/L 5 - -   CBC    Component Value Date/Time   WBC 14.6 (H) 02/29/2016 0922   RBC 4.21 02/29/2016 0922   HGB 10.6 (L) 02/29/2016 0922   HCT 33.2 (L) 02/29/2016 0922   PLT 412.0 (H) 02/29/2016 0922   MCV 78.8 02/29/2016 07564  MCH 23.0 (L) 12/05/2015 0342   MCHC 32.0 02/29/2016 0922   RDW 19.3 (H) 02/29/2016 0922   LYMPHSABS 0.8 02/29/2016 0922   MONOABS 0.5 02/29/2016 0922   EOSABS 0.1 02/29/2016 0922   BASOSABS 0.0 02/29/2016 0922      @ASSESSMENTPLANBEGIN @ Assessment: Encounter Diagnoses  Name Primary?  . Crohn's disease of small intestine without complication ([place]) Yes  . Abnormal loss of weight   . RLQ abdominal pain   . Long-term use of immunosuppressant medication   . Crohn's disease with complication, unspecified gastrointestinal tract location [facility])    Her Crohn's is not yet under adequate control. I wonder if there may also be some dietary intolerance and IBS overlay. I am very concerned about her weight loss.  Plan:  Continue Imuran at the  current dose Humira 40 mg IM weekly 4 weeks, then decrease to 40 mg every other week CBC, hepatic function panel, prealbumin and ESR today CT enterography to see if there is noticeable improvement compared with her pretreatment CT scan. The ileum was unable to be intubated during colonoscopy due to the degree of IC valve inflammation. She seemed to improve on until she was first diagnosed, so I have given her new prescription, 20 mg 2-3 times a day as needed.  Total time 35 minutes, over half spent in counseling and coordination of care.   [name]

## 2016-03-31 ENCOUNTER — Encounter: Payer: Self-pay | Admitting: Gastroenterology

## 2016-04-06 ENCOUNTER — Encounter: Payer: Self-pay | Admitting: Gastroenterology

## 2016-04-06 ENCOUNTER — Telehealth: Payer: Self-pay | Admitting: Gastroenterology

## 2016-04-06 ENCOUNTER — Ambulatory Visit (HOSPITAL_COMMUNITY)
Admission: RE | Admit: 2016-04-06 | Discharge: 2016-04-06 | Disposition: A | Discharge status: Home / Self Care | Payer: Medicaid Other | Source: Ambulatory Visit | Attending: Gastroenterology | Admitting: Gastroenterology

## 2016-04-06 ENCOUNTER — Encounter (HOSPITAL_COMMUNITY): Payer: Self-pay | Admitting: Radiology

## 2016-04-06 DIAGNOSIS — R1031 Right lower quadrant pain: Secondary | ICD-10-CM

## 2016-04-06 DIAGNOSIS — Z79899 Other long term (current) drug therapy: Secondary | ICD-10-CM

## 2016-04-06 DIAGNOSIS — K760 Fatty (change of) liver, not elsewhere classified: Secondary | ICD-10-CM | POA: Insufficient documentation

## 2016-04-06 DIAGNOSIS — N949 Unspecified condition associated with female genital organs and menstrual cycle: Secondary | ICD-10-CM | POA: Insufficient documentation

## 2016-04-06 DIAGNOSIS — R634 Abnormal weight loss: Secondary | ICD-10-CM

## 2016-04-06 DIAGNOSIS — K5000 Crohn's disease of small intestine without complications: Secondary | ICD-10-CM

## 2016-04-06 DIAGNOSIS — Z7960 Long term (current) use of unspecified immunomodulators and immunosuppressants: Secondary | ICD-10-CM

## 2016-04-06 MED ORDER — IOPAMIDOL (ISOVUE-300) INJECTION 61%
100.0000 mL | Freq: Once | INTRAVENOUS | Status: AC | PRN
  Administered 2016-04-06: 100 mL via INTRAVENOUS

## 2016-04-06 NOTE — Telephone Encounter (Signed)
error 

## 2016-04-08 NOTE — Telephone Encounter (Signed)
Left message to return call 

## 2016-04-08 NOTE — Telephone Encounter (Signed)
Patient calling back. She states that she has questions regarding Humira rx. Best [phone]

## 2016-04-11 ENCOUNTER — Other Ambulatory Visit: Payer: Self-pay

## 2016-04-11 DIAGNOSIS — K50919 Crohn's disease, unspecified, with unspecified complications: Secondary | ICD-10-CM

## 2016-04-11 MED ORDER — HUMIRA 40 MG/0.8ML ~~LOC~~ PSKT: 40.0000 mg | PREFILLED_SYRINGE | SUBCUTANEOUS | 0 refills | Status: DC

## 2016-04-11 NOTE — Telephone Encounter (Signed)
New RX for once a week dosing sent to [facility].

## 2016-04-11 NOTE — Telephone Encounter (Signed)
Pt returned call. She states she didn't get any [facility] from [facility]. Joy said [facility] told her they cant fill it and she has to use a specialty pharmacy.

## 2016-04-12 ENCOUNTER — Telehealth: Payer: Self-pay | Admitting: Gastroenterology

## 2016-04-12 ENCOUNTER — Emergency Department (HOSPITAL_COMMUNITY)
Admission: EM | Admit: 2016-04-12 | Discharge: 2016-04-13 | Disposition: A | Discharge status: Home / Self Care | Payer: Medicaid Other | Attending: Emergency Medicine | Admitting: Emergency Medicine

## 2016-04-12 ENCOUNTER — Other Ambulatory Visit: Payer: Self-pay

## 2016-04-12 ENCOUNTER — Encounter (HOSPITAL_COMMUNITY): Payer: Self-pay | Admitting: *Deleted

## 2016-04-12 DIAGNOSIS — D509 Iron deficiency anemia, unspecified: Secondary | ICD-10-CM | POA: Insufficient documentation

## 2016-04-12 DIAGNOSIS — K5000 Crohn's disease of small intestine without complications: Secondary | ICD-10-CM | POA: Insufficient documentation

## 2016-04-12 DIAGNOSIS — Z87891 Personal history of nicotine dependence: Secondary | ICD-10-CM | POA: Insufficient documentation

## 2016-04-12 DIAGNOSIS — K50118 Crohn's disease of large intestine with other complication: Secondary | ICD-10-CM

## 2016-04-12 DIAGNOSIS — N390 Urinary tract infection, site not specified: Secondary | ICD-10-CM | POA: Insufficient documentation

## 2016-04-12 DIAGNOSIS — I10 Essential (primary) hypertension: Secondary | ICD-10-CM | POA: Insufficient documentation

## 2016-04-12 DIAGNOSIS — Z79899 Other long term (current) drug therapy: Secondary | ICD-10-CM | POA: Diagnosis not present

## 2016-04-12 DIAGNOSIS — R109 Unspecified abdominal pain: Secondary | ICD-10-CM

## 2016-04-12 LAB — COMPREHENSIVE METABOLIC PANEL
ALBUMIN: 3.7 g/dL (ref 3.5–5.0)
ALT: 9 U/L — ABNORMAL LOW (ref 14–54)
ANION GAP: 13 (ref 5–15)
AST: 16 U/L (ref 15–41)
Alkaline Phosphatase: 49 U/L (ref 38–126)
BILIRUBIN TOTAL: 0.7 mg/dL (ref 0.3–1.2)
BUN: 8 mg/dL (ref 6–20)
CHLORIDE: 100 mmol/L — AB (ref 101–111)
CO2: 25 mmol/L (ref 22–32)
Calcium: 9.4 mg/dL (ref 8.9–10.3)
Creatinine, Ser: 0.65 mg/dL (ref 0.44–1.00)
GFR calc Af Amer: >60 mL/min (ref >60)
GFR calc non Af Amer: >60 mL/min (ref >60)
Glucose, Bld: 95 mg/dL (ref 65–99)
POTASSIUM: 4 mmol/L (ref 3.5–5.1)
SODIUM: 138 mmol/L (ref 135–145)
TOTAL PROTEIN: 7.7 g/dL (ref 6.5–8.1)

## 2016-04-12 LAB — CBC
HEMATOCRIT: 35.2 % — AB (ref 36.0–46.0)
HEMOGLOBIN: 11.2 g/dL — AB (ref 12.0–15.0)
MCH: 24.6 pg — ABNORMAL LOW (ref 26.0–34.0)
MCHC: 31.8 g/dL (ref 30.0–36.0)
MCV: 77.4 fL — AB (ref 78.0–100.0)
Platelets: 455 10*3/uL — ABNORMAL HIGH (ref 150–400)
RBC: 4.55 MIL/uL (ref 3.87–5.11)
RDW: 16.8 % — ABNORMAL HIGH (ref 11.5–15.5)
WBC: 10 10*3/uL (ref 4.0–10.5)

## 2016-04-12 LAB — LIPASE, BLOOD: LIPASE: 17 U/L (ref 11–51)

## 2016-04-12 LAB — I-STAT BETA HCG BLOOD, ED (MC, WL, AP ONLY): I-stat hCG, quantitative: <5 m[IU]/mL (ref <5)

## 2016-04-12 MED ORDER — BUDESONIDE 3 MG PO CPEP: 9.0000 mg | ORAL_CAPSULE | Freq: Every day | ORAL | 1 refills | Status: DC

## 2016-04-12 MED ORDER — SODIUM CHLORIDE 0.9 % IV SOLN
1000.0000 mL | INTRAVENOUS | Status: DC
  Administered 2016-04-13: 1000 mL via INTRAVENOUS

## 2016-04-12 MED ORDER — METHYLPREDNISOLONE SODIUM SUCC 125 MG IJ SOLR
125.0000 mg | Freq: Once | INTRAMUSCULAR | Status: AC
  Administered 2016-04-13: 125 mg via INTRAVENOUS
  Filled 2016-04-12: qty 2

## 2016-04-12 MED ORDER — MORPHINE SULFATE (PF) 4 MG/ML IV SOLN
4.0000 mg | Freq: Once | INTRAVENOUS | Status: AC
  Administered 2016-04-13: 4 mg via INTRAVENOUS
  Filled 2016-04-12: qty 1

## 2016-04-12 MED ORDER — ONDANSETRON HCL 4 MG/2ML IJ SOLN
4.0000 mg | Freq: Once | INTRAMUSCULAR | Status: AC
  Administered 2016-04-13: 4 mg via INTRAVENOUS
  Filled 2016-04-12: qty 2

## 2016-04-12 MED ORDER — SODIUM CHLORIDE 0.9 % IV SOLN
1000.0000 mL | Freq: Once | INTRAVENOUS | Status: AC
  Administered 2016-04-13: 1000 mL via INTRAVENOUS

## 2016-04-12 NOTE — Telephone Encounter (Signed)
Spoke to patient, reviewed CT results with her. Patient verbalized understanding that she is to come to get labs drawn this week. Patient stated that she still hadn't had previous labs drawn, instructed her that she could have those done at the same time. Patient also understands that Dr. Loletha Carrow wants to have her start budesonide 3 mg tabs, three tabs by mouth daily. Patient aware that this will be sent to her pharmacy. Patient has started Humira and Azathioprine. Next OV is scheduled for 05/31/16 @ 3:45.

## 2016-04-12 NOTE — ED Provider Notes (Signed)
[facility] DEPT Provider Note   CSN: [id number] Arrival date & time: 04/12/16  1906  By signing my name below, I, Higinio Plan, attest that this documentation has been prepared under the direction and in the presence of Delora Fuel, MD . Electronically Signed: Higinio Plan, Scribe. 04/12/2016. 11:51 PM.  History   Chief Complaint Chief Complaint  Patient presents with  . Abdominal Pain   The history is provided by the patient. No language interpreter was used.   HPI Comments: Marissa Blankenship is a 50 y.o. female with PMHx of HTN, GERD, IBS and Chron's disease, who presents to the Emergency Department complaining of gradually worsening, 10/10, abdominal pain that began 4 weeks ago and worsened today. Pt reports her pain radiates into her back and chest. She states her pain is exacerbated when bending over and placing pressure on her chest and abdomen. Pt reports she had a CT scan of her abdomen on 04/06/16 that indicated active Chron's disease. She notes associated chills, nausea, constipation, decreased appetite and 2 episodes of vomiting today. She states her pain is unchanged after episodes of vomiting. Pt notes she has taken tylenol for her pain with no relief and ondansetron for her nausea with mild relief. She denies diarrhea, fever and diaphoresis. She states she was recently prescribed to take Humira once a week instead of twice a week.   Past Medical History:  Diagnosis Date  . Acid reflux   . Anemia   . Crohn's disease ([place])   . Hypertension   . IBS (irritable bowel syndrome)    spastic colon  . Shortness of breath     Patient Active Problem List   Diagnosis Date Noted  . Diarrhea   . RLQ abdominal pain   . Protein-calorie malnutrition, severe 12/02/2015  . Crohn's disease ([place]) 12/01/2015  . UTI (lower urinary tract infection) 12/01/2015  . Hypokalemia 12/01/2015  . Inadequate oral intake 12/01/2015  . Tachycardia 05/26/2011  . Dyspnea 05/26/2011  . HYPOKALEMIA 05/31/2010   . TINNITUS 05/25/2010  . DELAYED MENSES 05/25/2010  . PLANTAR FASCIITIS, LEFT 05/25/2010  . PRE-ECLAMPSIA 06/18/2007  . Essential hypertension 03/28/2007  . ACID REFLUX DISEASE 03/28/2007    Past Surgical History:  Procedure Laterality Date  . CESAREAN SECTION    . COLONOSCOPY WITH PROPOFOL N/A 12/02/2015   Procedure: COLONOSCOPY WITH PROPOFOL;  Surgeon: Doran Stabler, MD;  Location: [facility];  Service: Gastroenterology;  Laterality: N/A;  . ESOPHAGOGASTRODUODENOSCOPY    . WISDOM TOOTH EXTRACTION      OB History    Gravida Para Term Preterm AB Living   6 4   3 1 3    SAB TAB Ectopic Multiple Live Births     1     4       Home Medications    Prior to Admission medications   Medication Sig Start Date End Date Taking? Authorizing Provider  acetaminophen-codeine (TYLENOL #3) 300-30 MG tablet Take 1 tablet by mouth every 6 (six) hours as needed for moderate pain. 02/17/16   [name], MD  amLODipine (NORVASC) 5 MG tablet Take 5 mg by mouth daily.    Historical Provider, MD  azathioprine (IMURAN) 100 MG tablet Take 1 tablet (100 mg total) by mouth daily. 03/17/16 04/16/16  [name], MD  budesonide (ENTOCORT EC) 3 MG 24 hr capsule Take 3 capsules (9 mg total) by mouth daily. 04/12/16   [name], MD  dicyclomine (BENTYL) 20 MG tablet Take  1 tablet (20 mg total) by mouth 3 (three) times daily as needed (for abdominal pain.). 03/29/16   [name], MD  HUMIRA 40 MG/0.8ML PSKT Inject 0.8 mLs (40 mg total) into the skin every 7 (seven) days. Pt is to take once a week for a month, then resume every other week. 04/11/16   [name], MD  lisinopril (PRINIVIL,ZESTRIL) 40 MG tablet Take 1 tablet (40 mg total) by mouth every morning. 12/05/15   Eugenie Filler, MD  metoprolol tartrate (LOPRESSOR) 25 MG tablet Take 2 tablets (50 mg total) by mouth daily. 12/15/15   [name], MD  naproxen sodium (ANAPROX) 220 MG tablet Take 220 mg by mouth every 6  (six) hours as needed (for pain.).    Historical Provider, MD  ondansetron (ZOFRAN) 4 MG tablet Take 1 tablet (4 mg total) by mouth every 8 (eight) hours as needed for nausea or vomiting. 12/05/15   Eugenie Filler, MD  pantoprazole (PROTONIX) 40 MG tablet Take 40 mg by mouth daily. 10/27/15   Historical Provider, MD    Family History Family History  Problem Relation Age of Onset  . Hypertension Father 42  . Hypertension Mother 79  . Crohn's disease Sister   . Diabetes Paternal Grandmother   . Hypertension Brother   . Colon cancer Neg Hx   . Pancreatic cancer Neg Hx   . Stomach cancer Neg Hx   . Esophageal cancer Neg Hx     Social History Social History  Substance Use Topics  . Smoking status: Former Smoker    Quit date: 07/18/1997  . Smokeless tobacco: Never Used  . Alcohol use No     Allergies   Review of patient's allergies indicates no known allergies.   Review of Systems Review of Systems  Constitutional: Positive for appetite change and chills. Negative for diaphoresis and fever.  Cardiovascular: Positive for chest pain.  Gastrointestinal: Positive for constipation, nausea and vomiting. Negative for diarrhea.  Musculoskeletal: Positive for back pain.   Physical Exam Updated Vital Signs BP 148/85 (BP Location: Right Arm)   Pulse 65   Temp 98.3 F (36.8 C) (Oral)   Resp 18   Ht 5' 4"  (1.626 m)   Wt 142 lb (64.4 kg)   LMP 03/19/2016 Comment: tubal ligation  SpO2 100%   BMI 24.37 kg/m   Physical Exam  Constitutional: She is oriented to person, place, and time. She appears well-developed and well-nourished.  HENT:  Head: Normocephalic and atraumatic.  Eyes: EOM are normal. Pupils are equal, round, and reactive to light.  Neck: Normal range of motion. Neck supple. No JVD present.  Cardiovascular: Normal rate, regular rhythm and normal heart sounds.   No murmur heard. Pulmonary/Chest: Effort normal and breath sounds normal. She has no wheezes. She has no  rales. She exhibits no tenderness.  Abdominal: Soft. She exhibits no distension and no mass. There is tenderness.  Mild to moderate tenderness diffusely, bowel sounds slightly decreased.   Musculoskeletal: Normal range of motion. She exhibits no edema.  Lymphadenopathy:    She has no cervical adenopathy.  Neurological: She is alert and oriented to person, place, and time. No cranial nerve deficit. She exhibits normal muscle tone. Coordination normal.  Skin: Skin is warm and dry. No rash noted.  Psychiatric: She has a normal mood and affect. Her behavior is normal. Judgment and thought content normal.  Nursing note and vitals reviewed.  ED Treatments / Results  Labs (  all labs ordered are listed, but only abnormal results are displayed) Labs Reviewed  COMPREHENSIVE METABOLIC PANEL - Abnormal; Notable for the following:       Result Value   Chloride 100 (*)    ALT 9 (*)    All other components within normal limits  CBC - Abnormal; Notable for the following:    Hemoglobin 11.2 (*)    HCT 35.2 (*)    MCV 77.4 (*)    MCH 24.6 (*)    RDW 16.8 (*)    Platelets 455 (*)    All other components within normal limits  URINALYSIS, ROUTINE W REFLEX MICROSCOPIC (NOT AT [facility]) - Abnormal; Notable for the following:    APPearance CLOUDY (*)    Hgb urine dipstick SMALL (*)    Ketones, ur >80 (*)    Protein, ur 30 (*)    Nitrite POSITIVE (*)    Leukocytes, UA LARGE (*)    All other components within normal limits  URINE MICROSCOPIC-ADD ON - Abnormal; Notable for the following:    Squamous Epithelial / LPF 0-5 (*)    Bacteria, UA MANY (*)    All other components within normal limits  URINE CULTURE  LIPASE, BLOOD  I-STAT BETA HCG BLOOD, ED ([facility], [facility], [facility] ONLY)    Procedures Procedures (including critical care time)  Medications Ordered in ED Medications  0.9 %  sodium chloride infusion (0 mLs Intravenous Stopped 04/13/16 0047)    Followed by  0.9 %  sodium chloride infusion (1,000 mLs  Intravenous New Bag/Given 04/13/16 0017)  methylPREDNISolone sodium succinate (SOLU-MEDROL) 125 mg/2 mL injection 125 mg (125 mg Intravenous Given 04/13/16 0016)  morphine 4 MG/ML injection 4 mg (4 mg Intravenous Given 04/13/16 0016)  ondansetron (ZOFRAN) injection 4 mg (4 mg Intravenous Given 04/13/16 0017)  cefTRIAXone (ROCEPHIN) 1 g in dextrose 5 % 50 mL IVPB (0 g Intravenous Stopped 04/13/16 0231)    DIAGNOSTIC STUDIES:  Oxygen Saturation is 100% on RA, normal by my interpretation.    COORDINATION OF CARE:  11:51 PM Discussed treatment plan with pt at bedside and pt agreed to plan.  Initial Impression / Assessment and Plan / ED Course  I have reviewed the triage vital signs and the nursing notes.  Pertinent labs & imaging results that were available during my care of the patient were reviewed by me and considered in my medical decision making (see chart for details).  Clinical Course   Abdominal pain in patient with known history of Crohn's disease. Old records are reviewed, and she has been working with her gastroenterologist for management of her Crohn's disease as an outpatient. She does have prior ED visits for abdominal pain. She is given IV hydration and morphine for pain, methylprednisolone to treat possible Crohn's disease flareup, and screening labs are obtained. Hemoglobin is slightly low but unchanged from baseline. Electrolytes are normal. WBC is normal. Urinalysis shows evidence of urinary tract infection and I suspect that that may be contributing to her abdominal pain. She's given a dose of ceftriaxone and urine is sent for culture. Following this treatment, patient is feeling considerably better. She is discharged with prescription for cephalexin. Should been given a prescription for steroids from her gastroenterologist and she is to take those as prescribed. Follow-up with PCP and gastroenterology.  I personally performed the services described in this documentation, which was  scribed in my presence. The recorded information has been reviewed and is accurate.   Final Clinical Impressions(s) / ED Diagnoses  Final diagnoses:  Abdominal pain, unspecified abdominal location  Urinary tract infection without hematuria, site unspecified  Crohn's disease of small intestine without complication (HCC)  Microcytic hypochromic anemia    New Prescriptions New Prescriptions   CEPHALEXIN (KEFLEX) 500 MG CAPSULE    Take 1 capsule (500 mg total) by mouth 4 (four) times daily.     Delora Fuel, MD 35/43/01 4840

## 2016-04-12 NOTE — ED Notes (Signed)
Pt reports that she had an outpatient CT of her abd on 9/20

## 2016-04-12 NOTE — ED Triage Notes (Signed)
Pt states that she began having N/V that began on Sunday; pt states that she has had abd pain x 2 weeks that has progressively gotten worse; pt states that the pain is generalized to her abd and states that it is a stabbing sensation; pt reports 2 episodes of vomiting today; pt denies diarrhea

## 2016-04-13 ENCOUNTER — Telehealth: Payer: Self-pay | Admitting: Gastroenterology

## 2016-04-13 ENCOUNTER — Other Ambulatory Visit: Payer: Self-pay

## 2016-04-13 LAB — URINE MICROSCOPIC-ADD ON

## 2016-04-13 LAB — URINALYSIS, ROUTINE W REFLEX MICROSCOPIC
Bilirubin Urine: NEGATIVE
Glucose, UA: NEGATIVE mg/dL
Ketones, ur: >80 mg/dL — AB
NITRITE: POSITIVE — AB
PROTEIN: 30 mg/dL — AB
SPECIFIC GRAVITY, URINE: 1.018 (ref 1.005–1.030)
pH: 7.5 (ref 5.0–8.0)

## 2016-04-13 MED ORDER — CEPHALEXIN 500 MG PO CAPS: 500.0000 mg | ORAL_CAPSULE | Freq: Four times a day (QID) | ORAL | 0 refills | Status: DC

## 2016-04-13 MED ORDER — BUDESONIDE 3 MG PO CPEP: 9.0000 mg | ORAL_CAPSULE | Freq: Every day | ORAL | 1 refills | Status: DC

## 2016-04-13 MED ORDER — DEXTROSE 5 % IV SOLN
1.0000 g | Freq: Once | INTRAVENOUS | Status: AC
  Administered 2016-04-13: 1 g via INTRAVENOUS
  Filled 2016-04-13: qty 10

## 2016-04-13 NOTE — Telephone Encounter (Signed)
Rx resent to [facility] vs [facility]. Pt notified and aware.

## 2016-04-13 NOTE — Telephone Encounter (Signed)
Humira has been approved for the once a week dosing for 1 month. Per [facility] they will contact the pt and set up a delivery date and time. Left a voicemail for pt to inform her of the current update.

## 2016-04-15 LAB — URINE CULTURE: Culture: >=100000 — AB

## 2016-04-16 ENCOUNTER — Telehealth (HOSPITAL_BASED_OUTPATIENT_CLINIC_OR_DEPARTMENT_OTHER): Payer: Self-pay

## 2016-04-16 NOTE — Telephone Encounter (Signed)
Post ED Visit - Positive Culture Follow-up  Culture report reviewed by antimicrobial stewardship pharmacist:  []  Elenor Quinones, Pharm.D. []  Heide Guile, Pharm.D., BCPS []  Parks Neptune, Pharm.D. []  Alycia Rossetti, Pharm.D., BCPS []  [place], Pharm.D., BCPS, AAHIVP []  Legrand Como, Pharm.D., BCPS, AAHIVP []  Cassie Stewart, Pharm.D. []  Rob Dike, Pharm.D. Dimitri Ped Pharm D Positive urine culture Treated with Cephalexin, organism sensitive to the same and no further patient follow-up is required at this time.  Genia Del 04/16/2016, 9:11 AM

## 2016-04-19 ENCOUNTER — Other Ambulatory Visit: Payer: Self-pay

## 2016-04-19 ENCOUNTER — Telehealth: Payer: Self-pay | Admitting: Gastroenterology

## 2016-04-19 MED ORDER — AZATHIOPRINE 100 MG PO TABS: 100.0000 mg | ORAL_TABLET | Freq: Every day | ORAL | 2 refills | Status: DC

## 2016-04-19 NOTE — Telephone Encounter (Signed)
Spoke to patient she is feeling better and wants to clarify when she should start using her Humira on an every other week schedule. She states she has been on it weekly for a month. She also wants to know if she is to continue taking the Imuran and if so, she will need refills. Please advise.

## 2016-04-19 NOTE — Telephone Encounter (Signed)
She can now go to Humira every other week.  Yes, she is to continue the azathioprine (imuran) daily.  Please send refill for current dose.  One month supply with 2 refills.

## 2016-04-21 ENCOUNTER — Other Ambulatory Visit: Payer: Self-pay

## 2016-04-21 ENCOUNTER — Telehealth: Payer: Self-pay | Admitting: Gastroenterology

## 2016-04-21 NOTE — Telephone Encounter (Signed)
Spoke to patient, she understands to start Humira on an every other weekly schedule. She will hold it this week, next dose is next week. She also confirmed that she did pick up her Imuran and is to continue on that.

## 2016-04-22 ENCOUNTER — Emergency Department (HOSPITAL_COMMUNITY)
Admission: EM | Admit: 2016-04-22 | Discharge: 2016-04-22 | Disposition: A | Discharge status: Home / Self Care | Payer: Medicaid Other | Attending: Emergency Medicine | Admitting: Emergency Medicine

## 2016-04-22 ENCOUNTER — Telehealth: Payer: Self-pay | Admitting: Gastroenterology

## 2016-04-22 ENCOUNTER — Other Ambulatory Visit: Payer: Self-pay

## 2016-04-22 ENCOUNTER — Encounter (HOSPITAL_COMMUNITY): Payer: Self-pay | Admitting: Emergency Medicine

## 2016-04-22 DIAGNOSIS — Z87891 Personal history of nicotine dependence: Secondary | ICD-10-CM | POA: Insufficient documentation

## 2016-04-22 DIAGNOSIS — N390 Urinary tract infection, site not specified: Secondary | ICD-10-CM

## 2016-04-22 DIAGNOSIS — Z791 Long term (current) use of non-steroidal anti-inflammatories (NSAID): Secondary | ICD-10-CM | POA: Insufficient documentation

## 2016-04-22 DIAGNOSIS — I10 Essential (primary) hypertension: Secondary | ICD-10-CM | POA: Diagnosis not present

## 2016-04-22 DIAGNOSIS — K5090 Crohn's disease, unspecified, without complications: Secondary | ICD-10-CM | POA: Insufficient documentation

## 2016-04-22 DIAGNOSIS — R1032 Left lower quadrant pain: Secondary | ICD-10-CM | POA: Diagnosis present

## 2016-04-22 DIAGNOSIS — K50119 Crohn's disease of large intestine with unspecified complications: Secondary | ICD-10-CM

## 2016-04-22 DIAGNOSIS — Z79899 Other long term (current) drug therapy: Secondary | ICD-10-CM | POA: Insufficient documentation

## 2016-04-22 LAB — URINALYSIS, ROUTINE W REFLEX MICROSCOPIC
BILIRUBIN URINE: NEGATIVE
Glucose, UA: NEGATIVE mg/dL
Ketones, ur: 15 mg/dL — AB
Nitrite: POSITIVE — AB
PH: 5.5 (ref 5.0–8.0)
Protein, ur: 30 mg/dL — AB
SPECIFIC GRAVITY, URINE: 1.016 (ref 1.005–1.030)

## 2016-04-22 LAB — COMPREHENSIVE METABOLIC PANEL
ALT: 8 U/L — AB (ref 14–54)
AST: 12 U/L — ABNORMAL LOW (ref 15–41)
Albumin: 3.6 g/dL (ref 3.5–5.0)
Alkaline Phosphatase: 44 U/L (ref 38–126)
Anion gap: 7 (ref 5–15)
BUN: 7 mg/dL (ref 6–20)
CHLORIDE: 99 mmol/L — AB (ref 101–111)
CO2: 29 mmol/L (ref 22–32)
Calcium: 9 mg/dL (ref 8.9–10.3)
Creatinine, Ser: 0.69 mg/dL (ref 0.44–1.00)
Glucose, Bld: 75 mg/dL (ref 65–99)
POTASSIUM: 3.2 mmol/L — AB (ref 3.5–5.1)
SODIUM: 135 mmol/L (ref 135–145)
Total Bilirubin: 0.7 mg/dL (ref 0.3–1.2)
Total Protein: 7.4 g/dL (ref 6.5–8.1)

## 2016-04-22 LAB — LIPASE, BLOOD: LIPASE: 16 U/L (ref 11–51)

## 2016-04-22 LAB — CBC
HCT: 30.6 % — ABNORMAL LOW (ref 36.0–46.0)
HEMOGLOBIN: 9.4 g/dL — AB (ref 12.0–15.0)
MCH: 24.9 pg — AB (ref 26.0–34.0)
MCHC: 30.7 g/dL (ref 30.0–36.0)
MCV: 81 fL (ref 78.0–100.0)
Platelets: 421 10*3/uL — ABNORMAL HIGH (ref 150–400)
RBC: 3.78 MIL/uL — ABNORMAL LOW (ref 3.87–5.11)
RDW: 18 % — ABNORMAL HIGH (ref 11.5–15.5)
WBC: 9 10*3/uL (ref 4.0–10.5)

## 2016-04-22 LAB — URINE MICROSCOPIC-ADD ON

## 2016-04-22 LAB — I-STAT BETA HCG BLOOD, ED (MC, WL, AP ONLY): I-stat hCG, quantitative: <5 m[IU]/mL (ref <5)

## 2016-04-22 MED ORDER — SODIUM CHLORIDE 0.9 % IV SOLN
Freq: Once | INTRAVENOUS | Status: AC
  Administered 2016-04-22: 14:00:00 via INTRAVENOUS

## 2016-04-22 MED ORDER — PREDNISONE 10 MG PO TABS: ORAL_TABLET | ORAL | 0 refills | Status: DC

## 2016-04-22 MED ORDER — METHYLPREDNISOLONE SODIUM SUCC 125 MG IJ SOLR
125.0000 mg | Freq: Once | INTRAMUSCULAR | Status: AC
  Administered 2016-04-22: 125 mg via INTRAVENOUS
  Filled 2016-04-22: qty 2

## 2016-04-22 MED ORDER — HYDROCODONE-ACETAMINOPHEN 5-325 MG PO TABS: 2.0000 | ORAL_TABLET | ORAL | 0 refills | Status: DC | PRN

## 2016-04-22 MED ORDER — HYDROMORPHONE HCL 1 MG/ML IJ SOLN
1.0000 mg | Freq: Once | INTRAMUSCULAR | Status: AC
  Administered 2016-04-22: 1 mg via INTRAVENOUS
  Filled 2016-04-22: qty 1

## 2016-04-22 MED ORDER — ONDANSETRON HCL 4 MG/2ML IJ SOLN
4.0000 mg | Freq: Once | INTRAMUSCULAR | Status: AC
  Administered 2016-04-22: 4 mg via INTRAMUSCULAR
  Filled 2016-04-22: qty 2

## 2016-04-22 MED ORDER — CIPROFLOXACIN HCL 500 MG PO TABS: 500.0000 mg | ORAL_TABLET | Freq: Two times a day (BID) | ORAL | 0 refills | Status: DC

## 2016-04-22 MED ORDER — GI COCKTAIL ~~LOC~~
30.0000 mL | Freq: Once | ORAL | Status: AC
  Administered 2016-04-22: 30 mL via ORAL
  Filled 2016-04-22: qty 30

## 2016-04-22 NOTE — ED Provider Notes (Signed)
[facility] DEPT Provider Note   CSN: [id number] Arrival date & time: 04/22/16  1026     History   Chief Complaint Chief Complaint  Patient presents with  . Abdominal Pain    HPI Marissa Blankenship is a 50 y.o. female.  The history is provided by the patient. No language interpreter was used.  Abdominal Pain   This is a new problem. The current episode started more than 1 week ago. The problem occurs constantly. The problem has been gradually worsening. The pain is associated with eating. The pain is located in the RLQ and LLQ. The pain is severe. Pertinent negatives include vomiting and constipation. Nothing aggravates the symptoms. Nothing relieves the symptoms. Past workup includes GI consult. Her past medical history is significant for Crohn's disease.  Pt has chrons disease.  Pt reports she has had an exacerbation of symptoms over the past 3 weeks.  Her MD called in prednisone for her today.  Pt recently had a uti.  Pt is followed by Gi.   Past Medical History:  Diagnosis Date  . Acid reflux   . Anemia   . Crohn's disease ([place])   . Hypertension   . IBS (irritable bowel syndrome)    spastic colon  . Shortness of breath     Patient Active Problem List   Diagnosis Date Noted  . Diarrhea   . RLQ abdominal pain   . Protein-calorie malnutrition, severe 12/02/2015  . Crohn's disease ([place]) 12/01/2015  . UTI (lower urinary tract infection) 12/01/2015  . Hypokalemia 12/01/2015  . Inadequate oral intake 12/01/2015  . Tachycardia 05/26/2011  . Dyspnea 05/26/2011  . HYPOKALEMIA 05/31/2010  . TINNITUS 05/25/2010  . DELAYED MENSES 05/25/2010  . PLANTAR FASCIITIS, LEFT 05/25/2010  . PRE-ECLAMPSIA 06/18/2007  . Essential hypertension 03/28/2007  . ACID REFLUX DISEASE 03/28/2007    Past Surgical History:  Procedure Laterality Date  . CESAREAN SECTION    . COLONOSCOPY WITH PROPOFOL N/A 12/02/2015   Procedure: COLONOSCOPY WITH PROPOFOL;  Surgeon: Doran Stabler, MD;   Location: [facility];  Service: Gastroenterology;  Laterality: N/A;  . ESOPHAGOGASTRODUODENOSCOPY    . WISDOM TOOTH EXTRACTION      OB History    Gravida Para Term Preterm AB Living   6 4   3 1 3    SAB TAB Ectopic Multiple Live Births     1     4       Home Medications    Prior to Admission medications   Medication Sig Start Date End Date Taking? Authorizing Provider  acetaminophen-codeine (TYLENOL #3) 300-30 MG tablet Take 1 tablet by mouth every 6 (six) hours as needed for moderate pain. 02/17/16  Yes [name], MD  amLODipine (NORVASC) 5 MG tablet Take 5 mg by mouth daily.   Yes Historical Provider, MD  azathioprine (IMURAN) 100 MG tablet Take 1 tablet (100 mg total) by mouth daily. 04/19/16  Yes [name], MD  budesonide (ENTOCORT EC) 3 MG 24 hr capsule Take 3 capsules (9 mg total) by mouth daily. 04/13/16  Yes [name], MD  cephALEXin (KEFLEX) 500 MG capsule Take 1 capsule (500 mg total) by mouth 4 (four) times daily. 3/89/37  Yes Delora Fuel, MD  dicyclomine (BENTYL) 20 MG tablet Take 1 tablet (20 mg total) by mouth 3 (three) times daily as needed (for abdominal pain.). 03/29/16  Yes [name], MD  lisinopril (PRINIVIL,ZESTRIL) 40 MG tablet Take 1 tablet (40  mg total) by mouth every morning. 12/05/15  Yes Eugenie Filler, MD  metoprolol (LOPRESSOR) 50 MG tablet Take 50 mg by mouth daily.   Yes Historical Provider, MD  ondansetron (ZOFRAN) 4 MG tablet Take 1 tablet (4 mg total) by mouth every 8 (eight) hours as needed for nausea or vomiting. 12/05/15  Yes Eugenie Filler, MD  pantoprazole (PROTONIX) 40 MG tablet Take 40 mg by mouth daily. 10/27/15  Yes Historical Provider, MD  HUMIRA 40 MG/0.8ML PSKT Inject 0.8 mLs (40 mg total) into the skin every 7 (seven) days. Pt is to take once a week for a month, then resume every other week. 04/11/16   [name], MD  metoprolol tartrate (LOPRESSOR) 25 MG tablet Take 2 tablets (50 mg total) by mouth  daily. Patient not taking: Reported on 04/22/2016 12/15/15   [name], MD  naproxen sodium (ANAPROX) 220 MG tablet Take 220 mg by mouth every 6 (six) hours as needed (for pain.).    Historical Provider, MD  predniSONE (DELTASONE) 10 MG tablet Take four(4) tablets once daily for seven (7) days, then Take three (3) tablets once daily for seven (7) days, then Take two (2) tablets once daily for five (5) days, then Take one (1) tablet once daily for five (5) days Patient not taking: Reported on 04/22/2016 04/22/16   [name], MD  simethicone (MYLICON) 80 MG chewable tablet Chew 80 mg by mouth every 6 (six) hours as needed for flatulence.    Historical Provider, MD    Family History Family History  Problem Relation Age of Onset  . Hypertension Father 98  . Hypertension Mother 81  . Crohn's disease Sister   . Diabetes Paternal Grandmother   . Hypertension Brother   . Colon cancer Neg Hx   . Pancreatic cancer Neg Hx   . Stomach cancer Neg Hx   . Esophageal cancer Neg Hx     Social History Social History  Substance Use Topics  . Smoking status: Former Smoker    Quit date: 07/18/1997  . Smokeless tobacco: Never Used  . Alcohol use No     Allergies   Review of patient's allergies indicates no known allergies.   Review of Systems Review of Systems  Gastrointestinal: Positive for abdominal pain. Negative for constipation and vomiting.  All other systems reviewed and are negative.    Physical Exam Updated Vital Signs BP 125/79 (BP Location: Right Arm)   Pulse 64   Temp 98.2 F (36.8 C) (Oral)   Resp 14   Ht 5' 4"  (1.626 m)   Wt 64.4 kg   LMP 03/19/2016 Comment: tubal ligation  SpO2 98%   BMI 24.37 kg/m   Physical Exam  Constitutional: She appears well-developed and well-nourished. No distress.  HENT:  Head: Normocephalic and atraumatic.  Eyes: Conjunctivae are normal.  Neck: Neck supple.  Cardiovascular: Normal rate and regular rhythm.   No murmur  heard. Pulmonary/Chest: Effort normal and breath sounds normal. No respiratory distress.  Abdominal: Soft. Bowel sounds are normal. There is tenderness.  Musculoskeletal: She exhibits no edema.  Neurological: She is alert.  Skin: Skin is warm and dry.  Psychiatric: She has a normal mood and affect.  Nursing note and vitals reviewed.    ED Treatments / Results  Labs (all labs ordered are listed, but only abnormal results are displayed) Labs Reviewed  COMPREHENSIVE METABOLIC PANEL - Abnormal; Notable for the following:  Result Value   Potassium 3.2 (*)    Chloride 99 (*)    AST 12 (*)    ALT 8 (*)    All other components within normal limits  CBC - Abnormal; Notable for the following:    RBC 3.78 (*)    Hemoglobin 9.4 (*)    HCT 30.6 (*)    MCH 24.9 (*)    RDW 18.0 (*)    Platelets 421 (*)    All other components within normal limits  URINALYSIS, ROUTINE W REFLEX MICROSCOPIC (NOT AT [facility]) - Abnormal; Notable for the following:    Color, Urine AMBER (*)    APPearance CLOUDY (*)    Hgb urine dipstick MODERATE (*)    Ketones, ur 15 (*)    Protein, ur 30 (*)    Nitrite POSITIVE (*)    Leukocytes, UA LARGE (*)    All other components within normal limits  URINE MICROSCOPIC-ADD ON - Abnormal; Notable for the following:    Squamous Epithelial / LPF 0-5 (*)    Bacteria, UA MANY (*)    All other components within normal limits  LIPASE, BLOOD  I-STAT BETA HCG BLOOD, ED ([facility], [facility], [facility] ONLY)    EKG  EKG Interpretation None       Radiology No results found.  Procedures Procedures (including critical care time)  Medications Ordered in ED Medications  0.9 %  sodium chloride infusion ( Intravenous New Bag/Given 04/22/16 1331)  gi cocktail (Maalox,Lidocaine,Donnatal) (30 mLs Oral Given 04/22/16 1326)  HYDROmorphone (DILAUDID) injection 1 mg (1 mg Intravenous Given 04/22/16 1326)  ondansetron (ZOFRAN) injection 4 mg (4 mg Intramuscular Given 04/22/16 1326)     Initial  Impression / Assessment and Plan / ED Course  I have reviewed the triage vital signs and the nursing notes.  Pertinent labs & imaging results that were available during my care of the patient were reviewed by me and considered in my medical decision making (see chart for details).  Clinical Course  Pt given iv solumedrol.   Keflex for uti.   Pt advised to take other medications as directed    Final Clinical Impressions(s) / ED Diagnoses   Final diagnoses:  Urinary tract infection without hematuria, site unspecified  Crohn's colitis, unspecified complication (HCC)    New Prescriptions New Prescriptions   PREDNISONE (DELTASONE) 10 MG TABLET    Take four(4) tablets once daily for seven (7) days, then Take three (3) tablets once daily for seven (7) days, then Take two (2) tablets once daily for five (5) days, then Take one (1) tablet once daily for five (5) days     Fransico Meadow, PA-C 04/22/16 [place], MD 05/06/16 1012

## 2016-04-22 NOTE — Telephone Encounter (Signed)
Sorry to hear Marissa Blankenship is not feeling well - it has surely been difficult to get her Crohn's under control.  Given how inflamed the small bowel looked on the recent CT scan, we need to chage plans for a short while here to get her feeling better and try to keep her out of the ED.  I do not think Marissa Blankenship needs to be seen by primary care.  Stop entocort (budesonide) for now.  Just put it aside and do not discard it.  Marissa Blankenship needs a course of prednisone in hopes we can bridge her to the Humira/azathioprine getting the condition under better control.  Prednisone 10 mg tablets.   Take 4 tablets once daily for 7 days, then 3 tablets once daily for 7 days, then 2 tablets once daily for 5 days, then 1 tablet once daily for 5 days, then stop.  Disp # 65 tablets with no refills   Yes, see APP on the 13th.

## 2016-04-22 NOTE — Telephone Encounter (Signed)
Spoke to patient, her abdominal pain became worse last night. Pain was 10/10, took her pain medication and this relieved it to 5/10. Vomiting through the night. She states it feels like "gas" she did try her medication for gas relief and this did not help. I have made an appointment for her on 10/13 with APP. Told her that she could contact her PCP to see if she could be seen by them or ED if symptoms not subsiding. Please advise.

## 2016-04-22 NOTE — Discharge Instructions (Signed)
See your Gi doctor for recheck.

## 2016-04-22 NOTE — ED Triage Notes (Signed)
Pt complaint of generalized abdominal pain with associated n/v denies diarrhea onset Tuesday; hx of Chron.

## 2016-04-22 NOTE — Telephone Encounter (Signed)
Patient called, Marissa Blankenship could not stand the pain and is in ER now. Told her that I am sending in the Rx for prednisone and Marissa Blankenship needs to let them know that Dr. Loletha Carrow wanted her to stop the budesonide for now. Patient understands and will follow up on 10/13.

## 2016-04-23 LAB — URINE CULTURE

## 2016-04-29 ENCOUNTER — Ambulatory Visit (INDEPENDENT_AMBULATORY_CARE_PROVIDER_SITE_OTHER): Payer: Medicaid Other | Admitting: Gastroenterology

## 2016-04-29 ENCOUNTER — Encounter: Payer: Self-pay | Admitting: Gastroenterology

## 2016-04-29 VITALS — BP 120/70 | HR 72 | Ht 64.0 in | Wt 135.0 lb

## 2016-04-29 DIAGNOSIS — K50019 Crohn's disease of small intestine with unspecified complications: Secondary | ICD-10-CM

## 2016-04-29 MED ORDER — ACETAMINOPHEN-CODEINE #3 300-30 MG PO TABS: 1.0000 | ORAL_TABLET | Freq: Four times a day (QID) | ORAL | 0 refills | Status: DC | PRN

## 2016-04-29 MED ORDER — NA SULFATE-K SULFATE-MG SULF 17.5-3.13-1.6 GM/177ML PO SOLN: 1.0000 | ORAL | 0 refills | Status: DC

## 2016-04-29 NOTE — Patient Instructions (Signed)
Hold Imuran until you complete your Prednisone then resume.   You have been scheduled for a colonoscopy. Please follow written instructions given to you at your visit today.  Please pick up your prep supplies at the pharmacy within the next 1-3 days. If you use inhalers (even only as needed), please bring them with you on the day of your procedure. Your physician has requested that you go to www.[email or web address].[email or web address] and enter the access code given to you at your visit today. This web site gives a general overview about your procedure. However, you should still follow specific instructions given to you by our office regarding your preparation for the procedure.

## 2016-04-29 NOTE — Addendum Note (Signed)
Addended by: Wyline Beady on: 04/29/2016 03:02 PM   Modules accepted: Orders

## 2016-04-29 NOTE — Progress Notes (Signed)
     04/29/2016 Marissa Blankenship [id number] 04-17-1966   History of Present Illness:  Patient is here for follow-up of her small bowel Crohn's disease. She is known well to Dr. Loletha Carrow.  Since her last visit she underwent CT enterography that continued to show active disease in a long segment of her ileum. She was placed on budesonide, but has been to the emergency Department twice since September 12. On the same day as her last ER visit, October 6, her budesonide was discontinued and she was placed on a tapering course of prednisone by Dr. Loletha Carrow when she called complaining of worsening symptoms despite the budesonide. She is still on her Imuran, Humira, and prednisone taper; prednisone just started one week ago.  Since starting the prednisone she is feeling much better.  He has been able eat.  No diarrhea, actually having some constipation and is asking if she can use Miralax.  Current Medications, Allergies, Past Medical History, Past Surgical History, Family History and Social History were reviewed in [employer] record.   Physical Exam: BP 120/70 (BP Location: Left Arm, Patient Position: Sitting, Cuff Size: Normal)   Pulse 72   Ht 5' 4"  (1.626 m)   Wt 135 lb (61.2 kg)   LMP 04/22/2016 Comment: tubal ligation  Breastfeeding? No   BMI 23.17 kg/m  General: Well developed black female in no acute distress Head: Normocephalic and atraumatic Eyes:  Sclerae anicteric, conjunctiva pink  Ears: Normal auditory acuity Lungs: Clear throughout to auscultation Heart: Regular rate and rhythm Abdomen: Soft, non-distended.  Normal bowel sounds.  Non-tender. Rectal:  Will be done at the time of colonoscopy. Musculoskeletal: Symmetrical with no gross deformities  Extremities: No edema  Neurological: Alert oriented x 4, grossly non-focal Psychological:  Alert and cooperative. Normal mood and affect  Assessment and Recommendations: -Crohn's disease of the small bowel:  Currently on  Humira, Imuran, and prednisone as well after recent flare of symptoms.  Discussed with Dr. Loletha Carrow and he would like to repeat colonoscopy at the end of her steroid taper so that he can try to intubate the TI and get tissue biopsy to confirm Crohn's (was not able to intubate TI on last procedure); hopefully prednisone will reduce inflammation enough so that this can occur.  Will hold Imuran until finished with prednisone and then can resume.  Will refill Tylenol #3.  Ok to use Miralax for constipation.  Should continue to follow a soft, low fiber diet.

## 2016-04-29 NOTE — Progress Notes (Signed)
Thank you for sending this case to me. I have reviewed the entire note, and the outlined plan seems appropriate.  Thank you for reviewing her case with me in clinic today.

## 2016-05-03 ENCOUNTER — Telehealth: Payer: Self-pay | Admitting: Gastroenterology

## 2016-05-03 NOTE — Telephone Encounter (Signed)
Spoke to patient, she said that her Humira came two weeks ago and she just put it in the refrigerator without looking at it. Went to give herself an injection on Saturday and discovered it was pre-filled syringes. She was successful in giving herself the injection and will use the next three doses, but is requesting that the next refill be for the Humira Pens. Went over self injection instructions with syringe to make sure she did it correctly. Patient will call if she has questions or concerns.

## 2016-05-06 NOTE — Telephone Encounter (Signed)
Whichever dosage form she is comfortable with is fine with me.  Thanks for checking

## 2016-05-25 ENCOUNTER — Encounter: Payer: Self-pay | Admitting: Gastroenterology

## 2016-05-25 ENCOUNTER — Ambulatory Visit (AMBULATORY_SURGERY_CENTER): Payer: Medicaid Other | Admitting: Gastroenterology

## 2016-05-25 VITALS — BP 150/64 | HR 58 | Temp 97.8°F | Resp 15 | Ht 64.0 in | Wt 135.0 lb

## 2016-05-25 DIAGNOSIS — D120 Benign neoplasm of cecum: Secondary | ICD-10-CM | POA: Diagnosis not present

## 2016-05-25 DIAGNOSIS — K50019 Crohn's disease of small intestine with unspecified complications: Secondary | ICD-10-CM | POA: Diagnosis not present

## 2016-05-25 MED ORDER — SODIUM CHLORIDE 0.9 % IV SOLN: 500.0000 mL | INTRAVENOUS | Status: DC

## 2016-05-25 NOTE — Progress Notes (Signed)
Report to PACU, RN, vss, BBS= Clear.  

## 2016-05-25 NOTE — Progress Notes (Signed)
Called to room to assist during endoscopic procedure.  Patient ID and intended procedure confirmed with present staff. Received instructions for my participation in the procedure from the performing physician.  

## 2016-05-25 NOTE — Op Note (Signed)
[place] Patient Name: Marissa Blankenship Procedure Date: 05/25/2016 1:17 PM MRN: [id number] Endoscopist: [place]. Loletha Carrow , MD Age: 50 Referring MD:  Date of Birth: 1965/09/27 Gender: Female Account #: [id number] Procedure:                Colonoscopy Indications:              Suspected Crohn's disease of the small bowel Medicines:                Monitored Anesthesia Care Procedure:                Pre-Anesthesia Assessment:                           - Prior to the procedure, a History and Physical                            was performed, and patient medications and                            allergies were reviewed. The patient's tolerance of                            previous anesthesia was also reviewed. The risks                            and benefits of the procedure and the sedation                            options and risks were discussed with the patient.                            All questions were answered, and informed consent                            was obtained. Prior Anticoagulants: The patient has                            taken no previous anticoagulant or antiplatelet                            agents. ASA Grade Assessment: II - A patient with                            mild systemic disease. After reviewing the risks                            and benefits, the patient was deemed in                            satisfactory condition to undergo the procedure.                           After obtaining informed consent, the colonoscope  was passed under direct vision. Throughout the                            procedure, the patient's blood pressure, pulse, and                            oxygen saturations were monitored continuously. The                            Model CF-HQ190L [id number]) scope was introduced                            through the anus and advanced to the the cecum,                            identified by  appendiceal orifice and ileocecal                            valve. The Model PCF-H190L [id number]) scope was                            introduced through the and advanced to the. The                            colonoscopy was performed with difficulty. The                            patient tolerated the procedure well. The quality                            of the bowel preparation was good. The quality of                            the bowel preparation was evaluated using the BBPS                            [facility] Bowel Preparation Scale) with scores of:                            Right Colon = 2, Transverse Colon = 2 and Left                            Colon = 2. The total BBPS score equals 6. The bowel                            preparation used was SUPREP. The ileocecal valve,                            appendiceal orifice, and rectum were photographed. Scope In: 1:22:23 PM Scope Out: 1:54:13 PM Scope Withdrawal Time: 0 hours 27 minutes 27 seconds  Total Procedure Duration: 0 hours 31 minutes 50 seconds  Findings:  The perianal and digital rectal examinations were                            normal. Pertinent negatives include no fistula.                           Inflammation characterized by erythema, friability                            and pseudopolyps was found at the ICV. This was                            severe, and when compared to previous examinations,                            the findings are unchanged. 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                            Biopsies were taken with a cold forceps for                            histology of the ICV and also the TI by passing the                            biopsy forceps through the stenotic ICV into the                            ileum.                           The remainder of the colon was normal, with no                             visible Crohn's disease.                           Retroflexion in the rectum was not performed due to                            anatomy. Complications:            No immediate complications. Estimated Blood Loss:     Estimated blood loss: none. Impression:               - Crohn's disease with ileitis. Inflammation was                            found. This was severe. The findings are unchanged                            compared to previous examinations. Biopsied. Recommendation:           -  Patient has a contact number available for                            emergencies. The signs and symptoms of potential                            delayed complications were discussed with the                            patient. Return to normal activities tomorrow.                            Written discharge instructions were provided to the                            patient.                           - Resume previous diet.                           - Post procedure medication instructions were                            provided to the patient.                           1 ) resume asathioprine 100 mg once daily                           2) continue Humira 40 mg every other week                           Plans will be made to change Humira to vedolizumab,                            as the patient appears to be a primary anti-TNF                            non-responder. There is some fibrostenosis at ICV,                            but there is also a long segment of terminal ileum                            involved with inflammatory changes as evidenced by                            recent CT enterography and symptom response to                            prednisone. Therefore, treatment with an alternate  biologic agent is preferable to surgery at this                            juncture.                           - No recommendation for repeat colonoscopy at  this                            time. Chinedu Agustin L. Loletha Carrow, MD 05/25/2016 2:10:31 PM This report has been signed electronically.

## 2016-05-25 NOTE — Patient Instructions (Addendum)
Resume azathioprine 100 mg once daily  Continue Humira 40 mg (one pen) every other week.  We will be contacting you soon to begin a different medication that will replace Humira.  We must first go through a process to get approval for it.  Biopsies taken today. Result letter in your mail in 2-3 weeks. Call us with any questions or concerns. Thank you!!  YOU HAD AN ENDOSCOPIC PROCEDURE TODAY AT [facility]:   Refer to the procedure report that was given to you for any specific questions about what was found during the examination.  If the procedure report does not answer your questions, please call your gastroenterologist to clarify.  If you requested that your care partner not be given the details of your procedure findings, then the procedure report has been included in a sealed envelope for you to review at your convenience later.  YOU SHOULD EXPECT: Some feelings of bloating in the abdomen. Passage of more gas than usual.  Walking can help get rid of the air that was put into your GI tract during the procedure and reduce the bloating. If you had a lower endoscopy (such as a colonoscopy or flexible sigmoidoscopy) you may notice spotting of blood in your stool or on the toilet paper. If you underwent a bowel prep for your procedure, you may not have a normal bowel movement for a few days.  Please Note:  You might notice some irritation and congestion in your nose or some drainage.  This is from the oxygen used during your procedure.  There is no need for concern and it should clear up in a day or so.  SYMPTOMS TO REPORT IMMEDIATELY:   Following lower endoscopy (colonoscopy or flexible sigmoidoscopy):  Excessive amounts of blood in the stool  Significant tenderness or worsening of abdominal pains  Swelling of the abdomen that is new, acute  Fever of 100F or higher   For urgent or emergent issues, a gastroenterologist can be reached at any hour by calling [phone]  [phone].   DIET:  We do recommend a small meal at first, but then you may proceed to your regular diet.  Drink plenty of fluids but you should avoid alcoholic beverages for 24 hours.  ACTIVITY:  You should plan to take it easy for the rest of today and you should NOT DRIVE or use heavy machinery until tomorrow (because of the sedation medicines used during the test).    FOLLOW UP: Our staff will call the number listed on your records the next business day following your procedure to check on you and address any questions or concerns that you may have regarding the information given to you following your procedure. If we do not reach you, we will leave a message.  However, if you are feeling well and you are not experiencing any problems, there is no need to return our call.  We will assume that you have returned to your regular daily activities without incident.  If any biopsies were taken you will be contacted by phone or by letter within the next 1-3 weeks.  Please call us at [phone] if you have not heard about the biopsies in 3 weeks.    SIGNATURES/CONFIDENTIALITY: You and/or your care partner have signed paperwork which will be entered into your electronic medical record.  These signatures attest to the fact that that the information above on your After Visit Summary has been reviewed and is understood.  Full responsibility of the  confidentiality of this discharge information lies with you and/or your care-partner.

## 2016-05-26 ENCOUNTER — Telehealth: Payer: Self-pay | Admitting: Gastroenterology

## 2016-05-26 ENCOUNTER — Telehealth: Payer: Self-pay

## 2016-05-26 NOTE — Telephone Encounter (Signed)
Marissa Blankenship,    This Crohn's patient has been on Humira, but it is not working for her. Persistent symptoms and a recent CT enterography and yesterday's colonoscopy have shown that it is not working.  I discussed with her the plan to change to another biologic agent.   Please start an approval process for vedolizumab (tade name Weyman Rodney) It is given by IV infusion, and would most likely have to be done at the infusion center at [place] [facility].  Dose is 300 mg, given at weeks 0,2,6 and then every 8 weeks thereafter.  She will continue Humira until the vedolizumab replaces that.  Let me know if there are questions or forms to complete.  - HD

## 2016-05-26 NOTE — Telephone Encounter (Signed)
  Follow up Call-  Call back number 05/25/2016  Post procedure Call Back phone  # [phone]  Permission to leave phone message Yes  Some recent data might be hidden     Patient questions:  Do you have a fever, pain , or abdominal swelling? No. Pain Score  0 *  Have you tolerated food without any problems? Yes.    Have you been able to return to your normal activities? Yes.    Do you have any questions about your discharge instructions: Diet   No. Medications  No. Follow up visit  No.  Do you have questions or concerns about your Care? No.  Actions: * If pain score is 4 or above: No action needed, pain <4.

## 2016-05-26 NOTE — Telephone Encounter (Signed)
Amy, Can you please due a benefit investigation for enrollment to start patient on Entyvio. Thank you, let me know what you find out.  Almyra Free

## 2016-05-26 NOTE — Telephone Encounter (Signed)
Called patient to let her know that she will need to contact her caseworker to see if they will cover the Entyvio infusion and if so what the cost would be for the patient. I have asked that when she finds out that she call me back.

## 2016-05-26 NOTE — Telephone Encounter (Signed)
This patient has Medicaid.  There is no precert req for infusions. She will need to contact Medicaid to see if she would have to pay anything. With Medicaid, there is no "reps" that I can call to find out information.  They have to contact their caseworker.

## 2016-05-31 ENCOUNTER — Telehealth: Payer: Self-pay

## 2016-05-31 ENCOUNTER — Telehealth: Payer: Self-pay | Admitting: Gastroenterology

## 2016-05-31 ENCOUNTER — Other Ambulatory Visit: Payer: Self-pay

## 2016-05-31 ENCOUNTER — Ambulatory Visit: Payer: Medicaid Other | Admitting: Gastroenterology

## 2016-05-31 MED ORDER — CALCIUM CARBONATE-VITAMIN D 500-200 MG-UNIT PO TABS: 1.0000 | ORAL_TABLET | Freq: Two times a day (BID) | ORAL | 6 refills | Status: DC

## 2016-05-31 MED ORDER — PREDNISONE 10 MG PO TABS: 20.0000 mg | ORAL_TABLET | Freq: Every day | ORAL | 2 refills | Status: DC

## 2016-05-31 NOTE — Telephone Encounter (Signed)
Called patient back, she has still not called her caseworker to see if she has coverage for the [facility]. On 11/9 I had spoke to her re: message from [facility], that since she has Medicaid, patient needs to call her caseworker to see if this medication is covered. Patient understands and will call them today. I also discussed with her that I received a fax from [facility], who has been trying to get in touch with her re: Humira. Patient states that she has had several messages from them, and will try to contact them today. Patient also wanted to let you know that she states she is having break through abdominal pain since not being on her prednisone. She states she is having to take two Tylenol #3 at a time. Please advise.

## 2016-05-31 NOTE — Telephone Encounter (Signed)
Faxed request to Medicaid for the [facility] to include office notes, procedures. Patient aware.

## 2016-05-31 NOTE — Telephone Encounter (Signed)
Spoke to patient, she is taking the Humira on an every other week basis. Called [facility], they were trying to contact her about delivery. Reminded patient that it is very important that she not miss any doses of the Humira, she confirms understanding and will call them today. She has left a vm for her caseworker. I have looked into contacting Medicaid myself to see if this medication is covered. I have not found a form yet, for Entyvio, but will continue to search the website for information.  Let patient know that I have already sent an Rx for prednisone to her pharmacy. She also wishes to have her calcium with vit. D sent to the pharmacy, as it will be less expensive for her. Both have been sent.

## 2016-05-31 NOTE — Telephone Encounter (Signed)
1)  Please confirm that she is taking Humira as she reported to me that she is.  Her entire treatment plan for the future hinges upon that.  [facility] should be able to tell us why they are trying to reach Vevay. If you discover that she has not received Humira, then that is a problem.  If Man is not taking the Humira, she needs to tell us now  2) Assuming Nyleah has been on the Humira, then it is clearly not working well.  That is evidenced by ongoing symptoms, recent CT scan and colonoscopy findings.  Please find out of there is a process through which I can communicate with her Medicaid case worker about the Entvio.  I have a bad feeling that Medicaid rarely, if ever, covers this medicine.  That would be unfortunate, since unless we can try that, Emmily will need difficult and costly surgery.  Added to that, she would still need post-surgical maintenance Crohn's therapy other than Humira, 5-ASA meds (i.e. Delzicol) or prednisone.  Worst of all, she has been essentially steroid-dependent for the last 9 months.  If that continues, there will be short and long term detrimental health effects from that.  I don't know if Medicaid considers any of those points when deciding about approval/denial for biologic agents, but I dearly hope so.   3) For now, since she is feeling badly and nothing bu steroids are working, please have her resume prednisone 20 mg once daily until we can figure out what can be offered for therapy.  Make sure she has an adequate supply at the pharmacy.  She needs to take 1000-1200 mg of calcium plus vitamin D daily to decrease the risk of bone loss from the prednisone.  This is available as OTC supplements.  They are 500 or 600 mg calcium plus vitamin D, take 2 every day. Since she is going back on prednisone, please stop the azathioprine but continue Humira as instructed (every other week).  If the calcium plus vitamin D is more affordable for her as a prescription, then please do so.  She would need a month supply with 6 refills.  4) Lastly,  Her biopsies did not confirm the Crohn's disease, but I told her they were limited since I could not get the scope into the small bowel.    The pathologist tells me that an area of apparent inflammation is a polyp.  I will need to speak with the pathologist because it still looks like Crohn's-related inflammation to me.  If it is a true polyp, it is not an urgent matter and will be dealt with it at later date, depending upon where the Crohn's is going (that is, whether it will need surgery).

## 2016-06-01 ENCOUNTER — Telehealth: Payer: Self-pay | Admitting: Gastroenterology

## 2016-06-06 NOTE — Telephone Encounter (Signed)
I will be in the endo lab tomorrow and can leave a prescription for vicodin if she wants to come by in the mid to late afternoon to get it.

## 2016-06-06 NOTE — Telephone Encounter (Signed)
Patient calling back regarding this. She is also requesting hyrdrocodone rx

## 2016-06-06 NOTE — Telephone Encounter (Signed)
Patient called and she is requesting a refill on her Tylenol #3. She states she is taking it twice daily. Reports that while being back on prednisone the pain is not as intense but is never relieved. Please advise.

## 2016-06-07 ENCOUNTER — Telehealth: Payer: Self-pay

## 2016-06-07 ENCOUNTER — Other Ambulatory Visit: Payer: Self-pay | Admitting: Gastroenterology

## 2016-06-07 MED ORDER — HYDROCODONE-ACETAMINOPHEN 5-325 MG PO TABS: 1.0000 | ORAL_TABLET | Freq: Four times a day (QID) | ORAL | 0 refills | Status: DC | PRN

## 2016-06-07 NOTE — Telephone Encounter (Signed)
Spoke to patient today, she will come pick up her Rx for pain medication. She also let me know that she received a denial from Medicaid for the [facility].  I asked her to bring the letter with her so I could make a copy of it. Copy made to scan into chart, another placed in Dr. Loletha Carrow' in box on his desk.

## 2016-06-07 NOTE — Telephone Encounter (Signed)
Patient will come by around 3-3:30 to pick up prescription. Thank you.

## 2016-06-08 ENCOUNTER — Telehealth: Payer: Self-pay | Admitting: Gastroenterology

## 2016-06-08 NOTE — Telephone Encounter (Signed)
Please call [name] today:  I have reviewed the Medicaid denial of Entyvio (vedolizumab) that Marissa Blankenship brought by the office. Essentially, they denied it because she is currently on Humira.  This makes no sense, since we asked them for Entyvio because the Humira is not working.  Here's the plan:   Our last effort to get Entyvio for Marissa Blankenship (to hopefully keep her form needing extensive and costly surgery) id to stop the Humira immediately.  Then she is no longer "on an injectable immune modulator", which was Medicaid's objection. Then, please resubmit the Entyvio request to Medicaid by the same metod as before, but update request to state the patient is off Humira and now prednisone dependent from her Crohn's disease. Please approve Entyvio.  Also,  please send a referral to Dr Leighton Ruff of [facility] to evaluate patient for Crohn's disease.  If Medicaid either does not approve Entyvio or they do but it does not work, she will need surgery.  Therefore, I think it would be best for Dr Marcello Moores to get familiar with Marissa Blankenship's case now.  Please send all my office notes, colonoscopy reports and CT scan reports.  I will speak with Dr Marcello Moores in the next week or two about Marissa Blankenship's case in order to save time at their office consult.

## 2016-06-13 NOTE — Telephone Encounter (Signed)
Spoke to patient, let her know to stop the Humira. Will resubmit the information to Medicaid in an effort to get the [facility] approved. Patient understands that we will submit a referral to Dr. Leighton Ruff at [facility] to schedule a consult appointment.

## 2016-06-21 ENCOUNTER — Telehealth: Payer: Self-pay

## 2016-06-21 NOTE — Telephone Encounter (Signed)
Resubmitted forms and documentation that patient is no longer taking Humira. Faxed info to [employer] [phone].

## 2016-06-23 ENCOUNTER — Other Ambulatory Visit: Payer: Self-pay | Admitting: Gastroenterology

## 2016-06-23 ENCOUNTER — Other Ambulatory Visit: Payer: Self-pay

## 2016-06-23 MED ORDER — DICYCLOMINE HCL 20 MG PO TABS: 20.0000 mg | ORAL_TABLET | Freq: Three times a day (TID) | ORAL | 3 refills | Status: DC | PRN

## 2016-06-23 NOTE — Telephone Encounter (Signed)
Please see note from [facility]. Okay to renew RX

## 2016-06-23 NOTE — Telephone Encounter (Signed)
The dose cannot be changed on the edit function of this Rx Please cancel this Rx and send new Rx for 20 mg , one capsule 2-3 times daily as needed. Disp #60, RF 2

## 2016-06-24 ENCOUNTER — Telehealth: Payer: Self-pay

## 2016-06-24 NOTE — Telephone Encounter (Signed)
Patient is scheduled to see Dr. Leighton Ruff on 03/24/34 at 3:00, she is aware of the appointment. Still waiting on Entyvio prior auth.

## 2016-06-28 ENCOUNTER — Telehealth: Payer: Self-pay

## 2016-06-28 NOTE — Telephone Encounter (Signed)
Called [employer], spoke to Benedict, they have approved Entyvio infusion from 06/24/16 to 06/19/17. Ref# [id number].

## 2016-06-29 ENCOUNTER — Other Ambulatory Visit: Payer: Self-pay

## 2016-06-29 DIAGNOSIS — K50919 Crohn's disease, unspecified, with unspecified complications: Secondary | ICD-10-CM

## 2016-06-29 NOTE — Telephone Encounter (Signed)
Patient scheduled for first infusion of Entyvio 300 mg at [facility] on 12/20 at 11:00. Patient aware to continue taking prednisone 20 mg once daily, confirmed with patient not to take azathioprine, which she has not been taking. She understands to keep her appointment with Dr. Marcello Moores and has a follow up scheduled with Dr. Loletha Carrow on 1/16. Patient advised of induction infusion schedule, asked that she makes sure she has appointment for 2nd infusion before she leaves their facility on 12/20. If there is a problem she will call us to help facilitate this appointment.

## 2016-06-29 NOTE — Telephone Encounter (Signed)
Excellent news!  Please let Murray Hodgkins know this, then we need to make arrangements with [facility] infusion center to begin treatment as soon as possible. The dose is always 300 mg IV, and it is given at weeks zero, 2, 6 and then every 8 weeks.  I want her to remain on prednisone 20 mg once daily for now, since nothing else has kept the Crohn's under control.  I will most likely wean her off it after the first two infusions.  Keep the appointment with the surgeon, Dr Marcello Moores  She should be off azathioprine as well  I would like to see her about a week after the second infusion.

## 2016-07-04 ENCOUNTER — Telehealth: Payer: Self-pay | Admitting: Gastroenterology

## 2016-07-04 NOTE — Telephone Encounter (Signed)
Called patient to see if she had received message from [facility]. She plans on calling them today. I did call them also to let them know she is not longer taking Humira but switching to [facility].

## 2016-07-06 ENCOUNTER — Ambulatory Visit (HOSPITAL_COMMUNITY)
Admission: RE | Admit: 2016-07-06 | Discharge: 2016-07-06 | Disposition: A | Discharge status: Home / Self Care | Payer: Medicaid Other | Source: Ambulatory Visit | Attending: Gastroenterology | Admitting: Gastroenterology

## 2016-07-06 DIAGNOSIS — K50919 Crohn's disease, unspecified, with unspecified complications: Secondary | ICD-10-CM | POA: Diagnosis present

## 2016-07-06 MED ORDER — VEDOLIZUMAB 300 MG IV SOLR
300.0000 mg | Freq: Once | INTRAVENOUS | Status: AC
  Administered 2016-07-06: 300 mg via INTRAVENOUS
  Filled 2016-07-06: qty 5

## 2016-07-18 ENCOUNTER — Other Ambulatory Visit: Payer: Self-pay | Admitting: Gastroenterology

## 2016-07-20 ENCOUNTER — Telehealth: Payer: Self-pay

## 2016-07-20 ENCOUNTER — Ambulatory Visit (HOSPITAL_COMMUNITY)
Admission: RE | Admit: 2016-07-20 | Discharge: 2016-07-20 | Disposition: A | Discharge status: Home / Self Care | Payer: Medicaid Other | Source: Ambulatory Visit | Attending: Gastroenterology | Admitting: Gastroenterology

## 2016-07-20 DIAGNOSIS — K50919 Crohn's disease, unspecified, with unspecified complications: Secondary | ICD-10-CM

## 2016-07-20 DIAGNOSIS — K5090 Crohn's disease, unspecified, without complications: Secondary | ICD-10-CM | POA: Diagnosis not present

## 2016-07-20 MED ORDER — VEDOLIZUMAB 300 MG IV SOLR
300.0000 mg | Freq: Once | INTRAVENOUS | Status: AC
  Administered 2016-07-20: 300 mg via INTRAVENOUS
  Filled 2016-07-20: qty 5

## 2016-07-20 NOTE — Telephone Encounter (Signed)
Patient is at [facility] short stay, Dr. Loletha Carrow patient (he is out of office), nurse called to let us know patient had a tooth extraction yesterday and wants to know if it is okay to proceed for her Entyvio infusion. Please advise.

## 2016-07-20 NOTE — Telephone Encounter (Signed)
Patient states she had tooth extracted due to exposed nerve and could not do a root canal. She does not have an abscess and is not on antibiotic. She does not have a fever and will proceed with Entyvio infusion.

## 2016-07-20 NOTE — Progress Notes (Signed)
Pt stated that she had a tooth extraction yesterday due to exposed roots and unable to have root canal.  Dr Loletha Carrow' office called and I spoke with Almyra Free who spoke with the doctor who stated that pt could go ahead if not abcessed or not on antibiotics.

## 2016-07-20 NOTE — Telephone Encounter (Signed)
Why did she have the extraction? Was there an abscess? If she on antibiotics? If no, okay to proceed. If yes, hold infusion for 1 week assuming antibiotics are completed and she is asymptomatic. If still on antibiotics at that time or symptomatic, would recheck with Dr. Loletha Carrow at that point, prior to proceeding.

## 2016-07-21 ENCOUNTER — Telehealth: Payer: Self-pay | Admitting: Gastroenterology

## 2016-07-21 NOTE — Telephone Encounter (Signed)
Thank you for letting me know

## 2016-07-21 NOTE — Telephone Encounter (Signed)
Patient advised to decrease her prednisone to 15 mg daily. She has 10 mg tablets and understands to cut one in half and take 1 and 1/2 tablets daily. Suggested she get a pill cutter at [facility] or her pharmacy if she has difficulty cutting tablets. Confirmed her follow up visit with Dr. Loletha Carrow on 1/16. Patient also wanted to review the schedule again for her infustion of Entyvio, advised it is 0, 2, 6 and 8 weeks.

## 2016-07-21 NOTE — Telephone Encounter (Signed)
I believe Marissa Blankenship is still on prednisone 20 mg once daily.  Please have her decrease it to 15 mg once daily. If she has 10 mg tablets, then it will be one and a half tablets. If she has 20 mg tablets, then let me know and we will need a new prescription for 10 mg tablets so she can taper this way.

## 2016-08-01 NOTE — Telephone Encounter (Signed)
Done

## 2016-08-02 ENCOUNTER — Ambulatory Visit (INDEPENDENT_AMBULATORY_CARE_PROVIDER_SITE_OTHER): Payer: Medicaid Other | Admitting: Gastroenterology

## 2016-08-02 ENCOUNTER — Encounter: Payer: Self-pay | Admitting: Gastroenterology

## 2016-08-02 VITALS — BP 124/80 | HR 61 | Ht 64.0 in | Wt 143.4 lb

## 2016-08-02 DIAGNOSIS — K5000 Crohn's disease of small intestine without complications: Secondary | ICD-10-CM | POA: Diagnosis not present

## 2016-08-02 DIAGNOSIS — R1031 Right lower quadrant pain: Secondary | ICD-10-CM

## 2016-08-02 NOTE — Progress Notes (Signed)
Marissa Blankenship, Marissa Blankenship, Marissa Blankenship, Marissa Blankenship injection last week. Blankenship was stopped Marissa the patient is no longer on azathioprine. Because she had recurrence of abdominal pain with nausea Marissa vomiting, prednisone was resumed at 20 mg daily about 2 months ago. I recently decreased to 15 mg once daily. She has no eye redness or pain or skin rash, but has intermittent crampy lower abdominal pain, right greater than left. Her appetite is been good Marissa her weight increasing some on prednisone. She has intermittent diarrhea but no rectal bleeding. Of note, Marissa have been unable to get a tissue diagnosis since the degree of inflammation precluded terminal ileal intubation on 2 colonoscopies. On the most recent exam, she had a prominent Marissa inflamed IC valve which was biopsied Marissa surprisingly read out as adenoma. I still consider this doubtful since it was not polypoid in appearance Marissa I think perhaps may be due to chronic inflammation. She is soon to see the surgeon in consultation because if she does not respond to entity of, she is probably looking at resection.  ROS: Cardiovascular:  no chest pain Respiratory: no dyspnea  The patient's Past Medical, Family Marissa Social History were reviewed Marissa are on file in the EMR.  Objective:  Med list reviewed  Vital signs in last 24 hrs: Vitals:   08/02/16 0846  BP: 124/80  Pulse: 61    Physical Exam    HEENT: sclera anicteric, oral mucosa moist without lesions  Neck: supple, no thyromegaly, JVD or lymphadenopathy  Cardiac: RRR without murmurs, S1S2 heard, no peripheral  edema  Pulm: clear to auscultation bilaterally, normal RR Marissa effort noted  Abdomen: soft, Mild RLQ tenderness, with active bowel sounds. No guarding or palpable hepatosplenomegaly.  Skin; warm Marissa dry, no jaundice or rash  Recent Labs: CBC Latest Ref Rng & Units 04/22/2016 04/12/2016 02/29/2016  WBC 4.0 - 10.5 K/uL 9.0 10.0 14.6(H)  Hemoglobin 12.0 - 15.0 g/dL 9.4(L) 11.2(L) 10.6(L)  Hematocrit 36.0 - 46.0 % 30.6(L) 35.2(L) 33.2(L)  Platelets 150 - 400 K/uL 421(H) 455(H) 412.0(H)   CMP Latest Ref Rng & Units 04/22/2016 04/12/2016 02/29/2016  Glucose 65 - 99 mg/dL 75 95 -  BUN 6 - 20 mg/dL 7 8 -  Creatinine 0.44 - 1.00 mg/dL 0.69 0.65 -  Sodium 135 - 145 mmol/L 135 138 -  Potassium 3.5 - 5.1 mmol/L 3.2(L) 4.0 -  Chloride 101 - 111 mmol/L 99(L) 100(L) -  CO2 22 - 32 mmol/L 29 25 -  Calcium 8.9 - 10.3 mg/dL 9.0 9.4 -  Total Protein 6.5 - 8.1 g/dL 7.4 7.7 7.0  Total Bilirubin 0.3 - 1.2 mg/dL 0.7 0.7 0.2  Alkaline Phos 38 - 126 U/L 44 49 48  AST 15 - 41 U/L 12(L) 16 7  ALT 14 - 54 U/L 8(L) 9(L) 5    Radiology: See CT enterography report from 04/06/2016   @ASSESSMENTPLANBEGIN @ Assessment: Encounter Diagnoses  Name Primary?  . Crohn's disease of ileum without complication ([place])   . RLQ abdominal pain Yes    It has been very difficult to get her in  remission, Marissa anti-TNF therapy did not work. She did not have improvement in symptoms, radiologic findings endoscopic findings. There is a long segment of ileum involved, making her at increased risk for surgery Marissa the need for possible diverting ostomy. Nevertheless, it biological therapy does not work, surgery will be indicated.  Plan:  Continue into view as scheduled, next infusion in 3 or 4 weeks, then every 8 weeks thereafter. Taper prednisone per the written instructions given to the patient today. This will occur over the next 3-4 weeks.  Follow up in clinic in about 6 weeks.  After about 3 months of into view, I will most  likely perform a repeat CT enterography for deciding the next step of therapy.  She is up-to-date on vaccinations, Marissa is taking calcium plus vitamin D. She will need a bone density study when she is off prednisone.  Total time 30 minutes, over half spent in counseling Marissa coordination of care.   [name]

## 2016-08-02 NOTE — Patient Instructions (Addendum)
Prednisone taper:  Next Monday, decrease prednisone to 10 mg once daily  One  week later, decrease to 5 mg every other day alternating with 10 mg every other day for the following 2 weeks.  Then decrease to 5 mg once daily for 1 week, then stop prednisone.  See me in about 6 weeks ( 09-15-2016)   If you are age 49 or older, your body mass index should be between 23-30. Your Body mass index is 24.61 kg/m. If this is out of the aforementioned range listed, please consider follow up with your Primary Care Provider.  If you are age 38 or younger, your body mass index should be between 19-25. Your Body mass index is 24.61 kg/m. If this is out of the aformentioned range listed, please consider follow up with your Primary Care Provider.   Thank you for choosing [facility]  Dr Wilfrid Lund III

## 2016-08-25 ENCOUNTER — Telehealth: Payer: Self-pay | Admitting: Gastroenterology

## 2016-08-25 ENCOUNTER — Ambulatory Visit (INDEPENDENT_AMBULATORY_CARE_PROVIDER_SITE_OTHER): Payer: Medicaid Other | Admitting: Gastroenterology

## 2016-08-25 ENCOUNTER — Other Ambulatory Visit: Payer: Self-pay

## 2016-08-25 DIAGNOSIS — Z23 Encounter for immunization: Secondary | ICD-10-CM | POA: Diagnosis not present

## 2016-08-25 DIAGNOSIS — K50919 Crohn's disease, unspecified, with unspecified complications: Secondary | ICD-10-CM

## 2016-08-25 NOTE — Telephone Encounter (Signed)
Orders for Entyvio in Epic, date of infusion is 08/31/16.

## 2016-08-25 NOTE — Progress Notes (Signed)
Patient tolerated 3rd hep a and b vaccine. Waited 20 minutes in office and doing well, released home.

## 2016-08-30 ENCOUNTER — Other Ambulatory Visit (HOSPITAL_COMMUNITY): Payer: Self-pay | Admitting: *Deleted

## 2016-08-31 ENCOUNTER — Ambulatory Visit (HOSPITAL_COMMUNITY)
Admission: RE | Admit: 2016-08-31 | Discharge: 2016-08-31 | Disposition: A | Discharge status: Home / Self Care | Payer: Medicaid Other | Source: Ambulatory Visit | Attending: Gastroenterology | Admitting: Gastroenterology

## 2016-08-31 DIAGNOSIS — K50919 Crohn's disease, unspecified, with unspecified complications: Secondary | ICD-10-CM | POA: Diagnosis present

## 2016-08-31 MED ORDER — VEDOLIZUMAB 300 MG IV SOLR
300.0000 mg | Freq: Once | INTRAVENOUS | Status: DC
  Administered 2016-08-31: 300 mg via INTRAVENOUS
  Filled 2016-08-31: qty 5

## 2016-09-15 ENCOUNTER — Other Ambulatory Visit (INDEPENDENT_AMBULATORY_CARE_PROVIDER_SITE_OTHER): Payer: Medicaid Other

## 2016-09-15 ENCOUNTER — Ambulatory Visit (INDEPENDENT_AMBULATORY_CARE_PROVIDER_SITE_OTHER): Payer: Medicaid Other | Admitting: Gastroenterology

## 2016-09-15 ENCOUNTER — Encounter: Payer: Self-pay | Admitting: Gastroenterology

## 2016-09-15 VITALS — BP 114/74 | HR 80 | Ht 64.0 in | Wt 140.1 lb

## 2016-09-15 DIAGNOSIS — R1031 Right lower quadrant pain: Secondary | ICD-10-CM

## 2016-09-15 DIAGNOSIS — D500 Iron deficiency anemia secondary to blood loss (chronic): Secondary | ICD-10-CM

## 2016-09-15 DIAGNOSIS — R197 Diarrhea, unspecified: Secondary | ICD-10-CM

## 2016-09-15 DIAGNOSIS — K5000 Crohn's disease of small intestine without complications: Secondary | ICD-10-CM | POA: Diagnosis not present

## 2016-09-15 DIAGNOSIS — Z79899 Other long term (current) drug therapy: Secondary | ICD-10-CM | POA: Diagnosis not present

## 2016-09-15 DIAGNOSIS — R634 Abnormal weight loss: Secondary | ICD-10-CM

## 2016-09-15 DIAGNOSIS — Z7960 Long term (current) use of unspecified immunomodulators and immunosuppressants: Secondary | ICD-10-CM

## 2016-09-15 DIAGNOSIS — K50118 Crohn's disease of large intestine with other complication: Secondary | ICD-10-CM

## 2016-09-15 LAB — CBC WITH DIFFERENTIAL/PLATELET
BASOS PCT: 0.3 % (ref 0.0–3.0)
Basophils Absolute: 0 10*3/uL (ref 0.0–0.1)
EOS ABS: 0.2 10*3/uL (ref 0.0–0.7)
EOS PCT: 1.3 % (ref 0.0–5.0)
HEMATOCRIT: 33.6 % — AB (ref 36.0–46.0)
HEMOGLOBIN: 10.6 g/dL — AB (ref 12.0–15.0)
Lymphocytes Relative: 9.4 % — ABNORMAL LOW (ref 12.0–46.0)
Lymphs Abs: 1.1 10*3/uL (ref 0.7–4.0)
MCHC: 31.4 g/dL (ref 30.0–36.0)
MCV: 80.1 fl (ref 78.0–100.0)
Monocytes Absolute: 1.1 10*3/uL — ABNORMAL HIGH (ref 0.1–1.0)
Monocytes Relative: 9 % (ref 3.0–12.0)
Neutro Abs: 9.5 10*3/uL — ABNORMAL HIGH (ref 1.4–7.7)
Neutrophils Relative %: 80 % — ABNORMAL HIGH (ref 43.0–77.0)
Platelets: 341 10*3/uL (ref 150.0–400.0)
RBC: 4.2 Mil/uL (ref 3.87–5.11)
RDW: 18.6 % — AB (ref 11.5–15.5)
WBC: 11.9 10*3/uL — AB (ref 4.0–10.5)

## 2016-09-15 LAB — HEPATIC FUNCTION PANEL
ALT: 8 U/L (ref 0–35)
AST: 12 U/L (ref 0–37)
Albumin: 3.6 g/dL (ref 3.5–5.2)
Alkaline Phosphatase: 55 U/L (ref 39–117)
BILIRUBIN DIRECT: 0.1 mg/dL (ref 0.0–0.3)
BILIRUBIN TOTAL: 0.3 mg/dL (ref 0.2–1.2)
TOTAL PROTEIN: 7.2 g/dL (ref 6.0–8.3)

## 2016-09-15 LAB — HIGH SENSITIVITY CRP: CRP HIGH SENSITIVITY: 51.02 mg/L — AB (ref 0.000–5.000)

## 2016-09-15 LAB — SEDIMENTATION RATE: SED RATE: 61 mm/h — AB (ref 0–20)

## 2016-09-15 MED ORDER — DICYCLOMINE HCL 20 MG PO TABS: 20.0000 mg | ORAL_TABLET | Freq: Three times a day (TID) | ORAL | 3 refills | Status: DC | PRN

## 2016-09-15 MED ORDER — ONDANSETRON HCL 4 MG PO TABS: 4.0000 mg | ORAL_TABLET | Freq: Three times a day (TID) | ORAL | 1 refills | Status: DC | PRN

## 2016-09-15 NOTE — Progress Notes (Signed)
[facility] Progress Note  Chief Complaint: Ileal Crohn's disease  Subjective  History:  Marissa Blankenship is here for clinic follow-up, last visit was 08/02/2016. She has a long segment of inflamed and probably fibrous stenotic ileal Crohn's. She does not have a definite tissue diagnosis since the ileum could not be intubated on 2 different colonoscopies. She also has an unusual and unexpected pathology reading of ileocecal valve tissue specimen from her most recent colonoscopy reportedly showing adenoma. This did not fit clinically.  She has been seen in surgical consultation by Dr. Alecia Thomas because I think she might eventually require surgery if Biologics do not work. She was a primary nonresponder to Humira. She has not received her third Entyvio infusion 2 weeks ago, and finish prednisone 10 days ago. She was doing well until last evening, when she had some worsening right lower quadrant pain. He has intermittent diarrhea, but other times feels she cannot move her bowels and has bandlike mid abdominal bloating and crampy pain. She has both Bentyl and Vicodin at home for the pain. She also takes Zofran as needed for nausea. There's been no vomiting, her appetite has been fair as before and she is not losing weight. [name] continues to take calcium because she has been on prednisone.  ROS: Cardiovascular:  no chest pain Respiratory: no dyspnea  The patient's Past Medical, Family and Social History were reviewed and are on file in the EMR.  Objective:  Med list reviewed  Vital signs in last 24 hrs: Vitals:   09/15/16 0829  BP: 114/74  Pulse: 80    Physical Exam  Fatigued, nontoxic in no distress  HEENT: sclera anicteric, oral mucosa moist without lesions  Neck: supple, no thyromegaly, JVD or lymphadenopathy  Cardiac: RRR without murmurs, S1S2 heard, no peripheral edema  Pulm: clear to auscultation bilaterally, normal RR and effort noted  Abdomen: soft, nondistended, RLQ tenderness,  with active bowel sounds. No guarding or palpable hepatosplenomegaly.  Skin; warm and dry, no jaundice or rash    @ASSESSMENTPLANBEGIN@ Assessment: Encounter Diagnoses  Name Primary?  . Crohn's disease of small intestine without complication ([facility]) Yes  . RLQ abdominal pain   . Diarrhea, unspecified type   Iron  Deficiency anemia  I've been unable to get Evella off prednisone for at least a year except for brief periods. She is essentially prednisone dependent despite biologic therapy. She was a primary nonresponder to anti-TNF therapy. I am hopeful that into the oh may work, but studies show perhaps a 25% chance of remission in this refractory clinical scenario.  If the current biologic does not help, she will need surgery. It was quite a struggle to get Medicaid to approve this medicine,  and I do not expect they would approve Stelara.  Plan:  Short course of prednisone, 30 mg a day for 5 days, 21 g a day for 5 days, 10 mg a day for 5 days then stop.  Continue to take Bentyl and Vicodin as needed  Call us in 3 weeks with an update, sooner as needed  She has already scheduled her next Entyvio infusion for 6 weeks from now.  CBC and ESR today  Total time 30 minutes, over half spent in counseling and coordination of care.   Marissa Blankenship L Danis III  

## 2016-09-15 NOTE — Patient Instructions (Addendum)
If you are age 49 or older, your body mass index should be between 23-30. Your Body mass index is 24.05 kg/m. If this is out of the aforementioned range listed, please consider follow up with your Primary Care Provider.  If you are age 80 or younger, your body mass index should be between 19-25. Your Body mass index is 24.05 kg/m. If this is out of the aformentioned range listed, please consider follow up with your Primary Care Provider.   Prednisone 30 mg  (three tablets) once daily for 5 days, then 20 mg once daily for 5 days, then 10 mg once daily for 5 days.  Call us in 3 weeks with an update on your condition, or sooner if needed. Ask for Almyra Free (nurse)  [phone]

## 2016-09-16 LAB — PREALBUMIN: Prealbumin: 16 mg/dL — ABNORMAL LOW (ref 17–34)

## 2016-10-19 ENCOUNTER — Ambulatory Visit (HOSPITAL_COMMUNITY)
Admission: EM | Admit: 2016-10-19 | Discharge: 2016-10-19 | Disposition: A | Discharge status: Home / Self Care | Payer: Medicaid Other | Attending: Internal Medicine | Admitting: Internal Medicine

## 2016-10-19 ENCOUNTER — Encounter (HOSPITAL_COMMUNITY): Payer: Self-pay | Admitting: Family Medicine

## 2016-10-19 DIAGNOSIS — K219 Gastro-esophageal reflux disease without esophagitis: Secondary | ICD-10-CM | POA: Diagnosis not present

## 2016-10-19 DIAGNOSIS — K589 Irritable bowel syndrome without diarrhea: Secondary | ICD-10-CM | POA: Insufficient documentation

## 2016-10-19 DIAGNOSIS — N898 Other specified noninflammatory disorders of vagina: Secondary | ICD-10-CM | POA: Diagnosis present

## 2016-10-19 DIAGNOSIS — B9689 Other specified bacterial agents as the cause of diseases classified elsewhere: Secondary | ICD-10-CM | POA: Diagnosis not present

## 2016-10-19 DIAGNOSIS — F419 Anxiety disorder, unspecified: Secondary | ICD-10-CM | POA: Diagnosis not present

## 2016-10-19 DIAGNOSIS — I10 Essential (primary) hypertension: Secondary | ICD-10-CM | POA: Insufficient documentation

## 2016-10-19 DIAGNOSIS — N760 Acute vaginitis: Secondary | ICD-10-CM | POA: Insufficient documentation

## 2016-10-19 DIAGNOSIS — Z87891 Personal history of nicotine dependence: Secondary | ICD-10-CM | POA: Insufficient documentation

## 2016-10-19 DIAGNOSIS — N3001 Acute cystitis with hematuria: Secondary | ICD-10-CM | POA: Insufficient documentation

## 2016-10-19 DIAGNOSIS — K5090 Crohn's disease, unspecified, without complications: Secondary | ICD-10-CM | POA: Diagnosis not present

## 2016-10-19 DIAGNOSIS — R300 Dysuria: Secondary | ICD-10-CM | POA: Diagnosis present

## 2016-10-19 DIAGNOSIS — R109 Unspecified abdominal pain: Secondary | ICD-10-CM | POA: Diagnosis present

## 2016-10-19 LAB — POCT URINALYSIS DIP (DEVICE)
Bilirubin Urine: NEGATIVE
GLUCOSE, UA: NEGATIVE mg/dL
KETONES UR: NEGATIVE mg/dL
Nitrite: POSITIVE — AB
Specific Gravity, Urine: 1.025 (ref 1.005–1.030)
Urobilinogen, UA: 1 mg/dL (ref 0.0–1.0)
pH: 6 (ref 5.0–8.0)

## 2016-10-19 LAB — POCT PREGNANCY, URINE: Preg Test, Ur: NEGATIVE

## 2016-10-19 MED ORDER — METRONIDAZOLE 500 MG PO TABS: 500.0000 mg | ORAL_TABLET | Freq: Two times a day (BID) | ORAL | 0 refills | Status: DC

## 2016-10-19 MED ORDER — CEPHALEXIN 500 MG PO CAPS: 500.0000 mg | ORAL_CAPSULE | Freq: Four times a day (QID) | ORAL | 0 refills | Status: DC

## 2016-10-19 NOTE — ED Provider Notes (Signed)
CSN: [id number]     Arrival date & time 10/19/16  1243 History   None    Chief Complaint  Patient presents with  . Vaginal Discharge  . Dysuria  . Abdominal Pain   (Consider location/radiation/quality/duration/timing/severity/associated sxs/prior Treatment) Patient c/o vaginal discharge that is similar to DC when she has BV.  She has dysuria and urinary sx's.   The history is provided by the patient.  Vaginal Discharge  Quality:  White Severity:  Moderate Onset quality:  Sudden Duration:  2 days Timing:  Constant Progression:  Worsening Chronicity:  New Relieved by:  Nothing Worsened by:  Nothing Associated symptoms: abdominal pain and dysuria   Dysuria  Associated symptoms: abdominal pain and vaginal discharge   Abdominal Pain  Associated symptoms: dysuria and vaginal discharge     Past Medical History:  Diagnosis Date  . Acid reflux   . Allergy   . Anemia   . Anxiety   . Crohn's disease ([place])   . Depression   . Hypertension   . IBS (irritable bowel syndrome)    spastic colon  . Shortness of breath    Past Surgical History:  Procedure Laterality Date  . CESAREAN SECTION    . COLONOSCOPY    . COLONOSCOPY WITH PROPOFOL N/A 12/02/2015   Procedure: COLONOSCOPY WITH PROPOFOL;  Surgeon: Doran Stabler, MD;  Location: [facility];  Service: Gastroenterology;  Laterality: N/A;  . ESOPHAGOGASTRODUODENOSCOPY    . UPPER GASTROINTESTINAL ENDOSCOPY    . WISDOM TOOTH EXTRACTION     Family History  Problem Relation Age of Onset  . Hypertension Father 75  . Hypertension Mother 24  . Crohn's disease Sister   . Diabetes Paternal Grandmother   . Hypertension Brother   . Colon cancer Neg Hx   . Pancreatic cancer Neg Hx   . Stomach cancer Neg Hx   . Esophageal cancer Neg Hx   . Rectal cancer Neg Hx    Social History  Substance Use Topics  . Smoking status: Former Smoker    Types: Cigarettes    Quit date: 07/18/1997  . Smokeless tobacco: Never Used  . Alcohol use  No   OB History    Gravida Para Term Preterm AB Living   6 4   3 1 3    SAB TAB Ectopic Multiple Live Births     1     4     Review of Systems  Constitutional: Negative.   HENT: Negative.   Eyes: Negative.   Respiratory: Negative.   Cardiovascular: Negative.   Gastrointestinal: Positive for abdominal pain.  Endocrine: Negative.   Genitourinary: Positive for dysuria and vaginal discharge.  Musculoskeletal: Negative.   Allergic/Immunologic: Negative.   Neurological: Negative.   Hematological: Negative.   Psychiatric/Behavioral: Negative.     Allergies  Patient has no known allergies.  Home Medications   Prior to Admission medications   Medication Sig Start Date End Date Taking? Authorizing Provider  amLODipine (NORVASC) 10 MG tablet Take 1 tablet by mouth daily. 07/26/16   Historical Provider, MD  calcium-vitamin D (OSCAL WITH D) 500-200 MG-UNIT tablet Take 1 tablet by mouth 2 (two) times daily. 05/31/16   [name], MD  cephALEXin (KEFLEX) 500 MG capsule Take 1 capsule (500 mg total) by mouth 4 (four) times daily. 10/19/16   Lysbeth Penner, FNP  dicyclomine (BENTYL) 20 MG tablet Take 1 tablet (20 mg total) by mouth 3 (three) times daily as needed (for abdominal pain.). 09/15/16  [name], MD  HYDROcodone-acetaminophen (NORCO) 5-325 MG tablet Take 1-2 tablets by mouth every 6 (six) hours as needed for moderate pain. 06/07/16   [name], MD  lisinopril (PRINIVIL,ZESTRIL) 40 MG tablet Take 1 tablet (40 mg total) by mouth every morning. 12/05/15   Eugenie Filler, MD  metoprolol (LOPRESSOR) 50 MG tablet Take 1 tablet by mouth daily. 07/27/16   Historical Provider, MD  metroNIDAZOLE (FLAGYL) 500 MG tablet Take 1 tablet (500 mg total) by mouth 2 (two) times daily. 10/19/16   Lysbeth Penner, FNP  ondansetron (ZOFRAN) 4 MG tablet Take 1 tablet (4 mg total) by mouth every 8 (eight) hours as needed for nausea or vomiting. 09/15/16   [name], MD   pantoprazole (PROTONIX) 40 MG tablet Take 40 mg by mouth daily. 10/27/15   Historical Provider, MD  potassium chloride (MICRO-K) 10 MEQ CR capsule Take 1 capsule by mouth daily. 06/23/16   Historical Provider, MD  simethicone (MYLICON) 80 MG chewable tablet Chew 80 mg by mouth every 6 (six) hours as needed for flatulence.    Historical Provider, MD   Meds Ordered and Administered this Visit  Medications - No data to display  BP 127/79   Pulse 71   Temp 98.8 F (37.1 C) (Oral)   Resp 18   LMP 09/20/2016   SpO2 98%  No data found.   Physical Exam  Constitutional: She is oriented to person, place, and time. She appears well-developed and well-nourished.  HENT:  Head: Normocephalic and atraumatic.  Eyes: Conjunctivae and EOM are normal. Pupils are equal, round, and reactive to light.  Neck: Normal range of motion. Neck supple.  Cardiovascular: Normal rate, regular rhythm and normal heart sounds.   Pulmonary/Chest: Effort normal and breath sounds normal.  Abdominal: Soft. Bowel sounds are normal.  Neurological: She is alert and oriented to person, place, and time.  Nursing note and vitals reviewed.   Urgent Care Course     Procedures (including critical care time)  Labs Review Labs Reviewed  POCT URINALYSIS DIP (DEVICE) - Abnormal; Notable for the following:       Result Value   Hgb urine dipstick LARGE (*)    Protein, ur >=300 (*)    Nitrite POSITIVE (*)    Leukocytes, UA LARGE (*)    All other components within normal limits  URINE CULTURE  POCT PREGNANCY, URINE  URINE CYTOLOGY ANCILLARY ONLY    Imaging Review No results found.   Visual Acuity Review  Right Eye Distance:   Left Eye Distance:   Bilateral Distance:    Right Eye Near:   Left Eye Near:    Bilateral Near:         MDM   1. BV (bacterial vaginosis)   2. Acute cystitis with hematuria    Keflex 543m one po qid x 7 days #28 Flagyl 5093mone po bid x 7 days #14  UA cytology - GC/  Chlamydia Trich UA cx She declines any empirical tx for GC/chlamydia She declines exam.      WiLysbeth PennerFNP 10/19/16 1411

## 2016-10-19 NOTE — ED Triage Notes (Signed)
Pt here for vaginal discharge, dysuria and urgency x 4 days.

## 2016-10-20 LAB — URINE CYTOLOGY ANCILLARY ONLY
Chlamydia: NEGATIVE
Neisseria Gonorrhea: NEGATIVE
Trichomonas: NEGATIVE

## 2016-10-24 ENCOUNTER — Telehealth: Payer: Self-pay

## 2016-10-25 NOTE — Telephone Encounter (Signed)
Patient advised to proceed with Entyvio infusion.

## 2016-10-25 NOTE — Telephone Encounter (Signed)
Thanks for checking. She can have her Entyvio infusion as scheduled on 4/11

## 2016-10-25 NOTE — Telephone Encounter (Signed)
Spoke to patient, she went to urgent care on 10/19/16. Diagnosed with bacterial vaginosis and UTI. They put her on keflex and flagyl, last dose will be tomorrow 4/11. Her question is that she is due for her Entyvio on 4/11, does she need to reschedule this? She states she is feeling much better, no fever.

## 2016-10-26 ENCOUNTER — Ambulatory Visit (HOSPITAL_COMMUNITY)
Admission: RE | Admit: 2016-10-26 | Discharge: 2016-10-26 | Disposition: A | Discharge status: Home / Self Care | Payer: Medicaid Other | Source: Ambulatory Visit | Attending: Gastroenterology | Admitting: Gastroenterology

## 2016-10-26 DIAGNOSIS — K50919 Crohn's disease, unspecified, with unspecified complications: Secondary | ICD-10-CM | POA: Insufficient documentation

## 2016-10-26 MED ORDER — SODIUM CHLORIDE 0.9 % IV SOLN
300.0000 mg | Freq: Once | INTRAVENOUS | Status: AC
  Administered 2016-10-26: 10:00:00 300 mg via INTRAVENOUS
  Filled 2016-10-26: qty 5

## 2016-11-22 ENCOUNTER — Telehealth: Payer: Self-pay

## 2016-11-22 ENCOUNTER — Other Ambulatory Visit (INDEPENDENT_AMBULATORY_CARE_PROVIDER_SITE_OTHER): Payer: Medicaid Other

## 2016-11-22 ENCOUNTER — Ambulatory Visit (INDEPENDENT_AMBULATORY_CARE_PROVIDER_SITE_OTHER): Payer: Medicaid Other | Admitting: Gastroenterology

## 2016-11-22 ENCOUNTER — Encounter: Payer: Self-pay | Admitting: Gastroenterology

## 2016-11-22 VITALS — BP 90/70 | HR 72 | Ht 64.0 in | Wt 127.5 lb

## 2016-11-22 DIAGNOSIS — R1031 Right lower quadrant pain: Secondary | ICD-10-CM

## 2016-11-22 DIAGNOSIS — K5000 Crohn's disease of small intestine without complications: Secondary | ICD-10-CM

## 2016-11-22 DIAGNOSIS — D500 Iron deficiency anemia secondary to blood loss (chronic): Secondary | ICD-10-CM | POA: Diagnosis not present

## 2016-11-22 DIAGNOSIS — R634 Abnormal weight loss: Secondary | ICD-10-CM

## 2016-11-22 DIAGNOSIS — Z7960 Long term (current) use of unspecified immunomodulators and immunosuppressants: Secondary | ICD-10-CM

## 2016-11-22 DIAGNOSIS — Z79899 Other long term (current) drug therapy: Secondary | ICD-10-CM | POA: Diagnosis not present

## 2016-11-22 LAB — CBC WITH DIFFERENTIAL/PLATELET
BASOS ABS: 0.1 10*3/uL (ref 0.0–0.1)
Basophils Relative: 0.8 % (ref 0.0–3.0)
EOS ABS: 0.4 10*3/uL (ref 0.0–0.7)
Eosinophils Relative: 5.3 % — ABNORMAL HIGH (ref 0.0–5.0)
HCT: 34.8 % — ABNORMAL LOW (ref 36.0–46.0)
Hemoglobin: 11.1 g/dL — ABNORMAL LOW (ref 12.0–15.0)
LYMPHS ABS: 1.7 10*3/uL (ref 0.7–4.0)
Lymphocytes Relative: 19.8 % (ref 12.0–46.0)
MCHC: 32 g/dL (ref 30.0–36.0)
MCV: 79.3 fl (ref 78.0–100.0)
MONO ABS: 0.8 10*3/uL (ref 0.1–1.0)
MONOS PCT: 9.8 % (ref 3.0–12.0)
NEUTROS ABS: 5.4 10*3/uL (ref 1.4–7.7)
NEUTROS PCT: 64.3 % (ref 43.0–77.0)
PLATELETS: 315 10*3/uL (ref 150.0–400.0)
RBC: 4.39 Mil/uL (ref 3.87–5.11)
RDW: 18.8 % — ABNORMAL HIGH (ref 11.5–15.5)
WBC: 8.4 10*3/uL (ref 4.0–10.5)

## 2016-11-22 LAB — IBC PANEL
IRON: 24 ug/dL — AB (ref 42–145)
Saturation Ratios: 7.8 % — ABNORMAL LOW (ref 20.0–50.0)
TRANSFERRIN: 221 mg/dL (ref 212.0–360.0)

## 2016-11-22 LAB — VITAMIN B12: VITAMIN B 12: 322 pg/mL (ref 211–911)

## 2016-11-22 LAB — BASIC METABOLIC PANEL
BUN: 12 mg/dL (ref 6–23)
CHLORIDE: 104 meq/L (ref 96–112)
CO2: 33 meq/L — AB (ref 19–32)
CREATININE: 0.74 mg/dL (ref 0.40–1.20)
Calcium: 9.3 mg/dL (ref 8.4–10.5)
GFR: 107.35 mL/min (ref >60.00)
Glucose, Bld: 89 mg/dL (ref 70–99)
POTASSIUM: 3.7 meq/L (ref 3.5–5.1)
SODIUM: 139 meq/L (ref 135–145)

## 2016-11-22 LAB — SEDIMENTATION RATE: SED RATE: 43 mm/h — AB (ref 0–20)

## 2016-11-22 LAB — FERRITIN: FERRITIN: 17.6 ng/mL (ref 10.0–291.0)

## 2016-11-22 LAB — ALBUMIN: Albumin: 3.7 g/dL (ref 3.5–5.2)

## 2016-11-22 NOTE — Telephone Encounter (Signed)
Verbal communication has been made with the pt. She states clear understanding to stop the Lisinopril as of today.

## 2016-11-22 NOTE — Progress Notes (Signed)
     [facility] Progress Note  Chief Complaint: Small bowel Crohn's disease  Subjective  History:  Marissa Blankenship follows up for her Crohn's disease. Please see prior office notes for details. Her most recent visit was on March 1, and she required another dose of prednisone to control her symptoms of abdominal pain constipation and nausea. She reports improving from that, and weaned off after a few weeks. Then in early April she had an episode of bacterial vaginosis for which she was seen at urgent care. She felt that the Flagyl they prescribed her upset her stomach, and she took a few days of prednisone she had left over. She still seems to have right lower quadrant pain, though somewhat less than before. She still tends toward constipation, which I think is because of severe small bowel inflammation and partial obstruction. She continues to take MiraLAX intermittently. She denies rectal bleeding, her appetite is still variable, and she is down 6 pounds from her last office visit.  ROS: Cardiovascular:  no chest pain Respiratory: no dyspnea  The patient's Past Medical, Family and Social History were reviewed and are on file in the EMR.  Objective:  Med list reviewed  Vital signs in last 24 hrs: Vitals:   11/22/16 1051  BP: 90/70  Pulse: 72  weight down to 127# (from 136 five months ago)   Physical Exam  She appears to be losing muscle mass  HEENT: sclera anicteric, oral mucosa moist without lesions  Neck: supple, no thyromegaly, JVD or lymphadenopathy  Cardiac: RRR without murmurs, S1S2 heard, no peripheral edema  Pulm: clear to auscultation bilaterally, normal RR and effort noted  Abdomen: soft, mild RLQ tenderness, with active bowel sounds. No guarding or palpable hepatosplenomegaly. No distention  Skin; warm and dry, no jaundice or rash  Recent Labs:  CBC Latest Ref Rng & Units 09/15/2016 04/22/2016 04/12/2016  WBC 4.0 - 10.5 K/uL 11.9(H) 9.0 10.0  Hemoglobin 12.0 -  15.0 g/dL 10.6(L) 9.4(L) 11.2(L)  Hematocrit 36.0 - 46.0 % 33.6(L) 30.6(L) 35.2(L)  Platelets 150.0 - 400.0 K/uL 341.0 421(H) 455(H)   At the last visit, her ESR was 61 and prealbumin 16. That was the visit at which I gave her the prednisone.    @ASSESSMENTPLANBEGIN @ Assessment: Encounter Diagnoses  Name Primary?  . Crohn's disease of small intestine without complication ([place]) Yes  . RLQ abdominal pain   . Iron deficiency anemia due to chronic blood loss   . Abnormal loss of weight   . Long-term use of immunosuppressant medication    I remain concerned that Kjersten small bowel Crohn's is still very active and may need surgery. She has had several vedolizumab (Entyvio) infusions, most recently on April 11. She is due for another one at the end of this month. She was a primary nonresponder to anti-TNF therapy.  In addition, her blood pressure is low on 3 antihypertensive agents. She reports a primary care appointment coming up next week.   Plan:  Stop lisinopril and follow up with primary care next week regarding ongoing management of blood pressure. Labs today: CBC, albumin, prealbumin, ESR, B12, iron studies. CT enterography. Depending upon results, she may need a reconsultation with colorectal surgery to consider resection.  Total time 25 minutes, over half spent in counseling and coordination of care.   Marissa Blankenship

## 2016-11-22 NOTE — Telephone Encounter (Signed)
-----   Message from [place], MD sent at 11/22/2016 12:09 PM EDT ----- I forgot to tell her Greenlee to stop her Lisinopril because her blood pressure is low.  I meant to put it on her AVS.  Please call her today and tell her that.  Thanks  - HD

## 2016-11-22 NOTE — Patient Instructions (Addendum)
If you are age 49 or older, your body mass index should be between 23-30. Your Body mass index is 21.89 kg/m. If this is out of the aforementioned range listed, please consider follow up with your Primary Care Provider.  If you are age 58 or younger, your body mass index should be between 19-25. Your Body mass index is 21.89 kg/m. If this is out of the aformentioned range listed, please consider follow up with your Primary Care Provider.   Your physician has requested that you go to the basement for lab work before leaving today.  You have been scheduled for a CT enterography of the abdomen and pelvis at [facility] ([address]---this is in the same building as Press [facility]).   You are scheduled on 11-28-2016 at 230pm. You should arrive at 1 pm for registration. Please follow the written instructions below on the day of your exam:  WARNING: IF YOU ARE ALLERGIC TO IODINE/X-RAY DYE, PLEASE NOTIFY RADIOLOGY IMMEDIATELY AT [phone]! YOU WILL BE GIVEN A 13 HOUR PREMEDICATION PREP.  1) Do not eat soild foods  4 hours prior to the test, liquids are okay.  You may take any medications as prescribed, except for the following: Metformin, Glucophage, Glucovance, Avandamet, Riomet, Fortamet, Actoplus Met, Janumet, Glumetza or Metaglip. The above medications must be held the day of the exam AND 48 hours after the exam.   you are having performed.  This test typically takes 30-45 minutes to complete.  If you have any questions regarding your exam or if you need to reschedule, you may call the CT department at [phone] between the hours of 8:00 am and 5:00 pm, Monday-Friday.  ________________________________________________________________________   Thank you for choosing [facility]  Dr Wilfrid Lund III

## 2016-11-23 LAB — PREALBUMIN: Prealbumin: 17 mg/dL (ref 17–34)

## 2016-11-28 ENCOUNTER — Ambulatory Visit (INDEPENDENT_AMBULATORY_CARE_PROVIDER_SITE_OTHER)
Admission: RE | Admit: 2016-11-28 | Discharge: 2016-11-28 | Disposition: A | Discharge status: Home / Self Care | Payer: Medicaid Other | Source: Ambulatory Visit | Attending: Gastroenterology | Admitting: Gastroenterology

## 2016-11-28 DIAGNOSIS — Z7960 Long term (current) use of unspecified immunomodulators and immunosuppressants: Secondary | ICD-10-CM

## 2016-11-28 DIAGNOSIS — R634 Abnormal weight loss: Secondary | ICD-10-CM

## 2016-11-28 DIAGNOSIS — K5000 Crohn's disease of small intestine without complications: Secondary | ICD-10-CM

## 2016-11-28 DIAGNOSIS — D500 Iron deficiency anemia secondary to blood loss (chronic): Secondary | ICD-10-CM

## 2016-11-28 DIAGNOSIS — R1031 Right lower quadrant pain: Secondary | ICD-10-CM | POA: Diagnosis not present

## 2016-11-28 DIAGNOSIS — Z79899 Other long term (current) drug therapy: Secondary | ICD-10-CM

## 2016-11-28 MED ORDER — IOPAMIDOL (ISOVUE-300) INJECTION 61%
100.0000 mL | Freq: Once | INTRAVENOUS | Status: AC | PRN
  Administered 2016-11-28: 100 mL via INTRAVENOUS

## 2016-12-05 ENCOUNTER — Other Ambulatory Visit: Payer: Self-pay | Admitting: Obstetrics & Gynecology

## 2016-12-05 DIAGNOSIS — Z1231 Encounter for screening mammogram for malignant neoplasm of breast: Secondary | ICD-10-CM

## 2016-12-06 ENCOUNTER — Telehealth: Payer: Self-pay | Admitting: Gastroenterology

## 2016-12-06 MED ORDER — DICYCLOMINE HCL 20 MG PO TABS: 20.0000 mg | ORAL_TABLET | Freq: Three times a day (TID) | ORAL | 3 refills | Status: DC | PRN

## 2016-12-06 NOTE — Telephone Encounter (Signed)
Refill request for Bentyl 20 mg 3 x a day as needed . Last seen on 11-22-2016

## 2016-12-06 NOTE — Telephone Encounter (Signed)
done

## 2016-12-08 ENCOUNTER — Ambulatory Visit
Admission: RE | Admit: 2016-12-08 | Discharge: 2016-12-08 | Disposition: A | Discharge status: Home / Self Care | Payer: Medicaid Other | Source: Ambulatory Visit | Attending: Obstetrics & Gynecology | Admitting: Obstetrics & Gynecology

## 2016-12-08 DIAGNOSIS — Z1231 Encounter for screening mammogram for malignant neoplasm of breast: Secondary | ICD-10-CM

## 2016-12-16 ENCOUNTER — Other Ambulatory Visit: Payer: Self-pay | Admitting: General Surgery

## 2016-12-16 NOTE — H&P (Signed)
History of Present Illness Leighton Ruff MD; 10/21/2701 5:10 PM) The patient is a 51 year old female who presents with Crohn's disease. 51 year old pleasant female who presents to the office for surgical evaluation for management of Crohn's disease. She was diagnosed with Crohn's disease 1 yr ago when she presented to the hospital with severe terminal ileitis and abdominal pain. CT scan showed inflammation around the terminal ileum and cecum with possible involvement of the right ovary. Colonoscopy was performed and shows minimal colonic involvement but significant inflammation in the terminal ileum. She tried Humira first with no change in symptoms. She is now on Etyvio. She is off of prednisone. She has 1 bowel movement a day which is somewhat irregular. She denies any bleeding. She has lost approximately 40-50 pounds over the past year but has gained some back. She complains of signs and symptoms consistent with partial obstruction and small bowel stricture.    Problem List/Past Medical Leighton Ruff, MD; 5/0/0938 5:25 PM) CROHN'S DISEASE OF ILEUM WITH FISTULA (K50.013)  Past Surgical History Leighton Ruff, MD; 07/26/2991 5:25 PM) Cesarean Section - Multiple  Diagnostic Studies History Leighton Ruff, MD; 01/15/6966 5:25 PM) Colonoscopy within last year Mammogram 1-3 years ago Pap Smear 1-5 years ago  Allergies Jerrye Bushy, [place]; 12/16/2016 4:29 PM) No Known Drug Allergies 08/09/2016 Allergies Reconciled  Medication History Leighton Ruff, MD; 02/23/3809 5:25 PM) Dicyclomine HCl (20MG Tablet, Oral) Active. Ondansetron HCl (4MG Tablet, Oral) Active. Iron (325 (65 Fe)MG Tablet, Oral) Active. Calcium-Vitamin D (250MG Capsule, Oral) Active. Simethicone (125MG Capsule, Oral) Active. Metoprolol Tartrate (50MG Tablet, Oral) Active. Pantoprazole Sodium (40MG Tablet DR, Oral) Active. Hydrocodone-Acetaminophen (5-325MG Tablet, Oral) Active. AmLODIPine Besylate (5MG Tablet,  Oral) Active. Potassium Chloride ER (10MEQ Capsule ER, Oral) Active. Medications Reconciled Neomycin Sulfate (500MG Tablet, 2 (two) Tablet Oral SEE NOTE, Taken starting 12/16/2016) Active. (TAKE TWO TABLETS AT 2 PM, 3 PM, AND 10 PM THE DAY PRIOR TO SURGERY) Flagyl (500MG Tablet, 2 (two) Tablet Oral SEE NOTE, Taken starting 12/16/2016) Active. (Take at 2pm, 3pm, and 10pm the day prior to your colon operation)  Social History Leighton Ruff, MD; 07/24/5100 5:25 PM) Alcohol use Occasional alcohol use. Caffeine use Carbonated beverages. Illicit drug use Prefer to discuss with provider. Tobacco use Former smoker.  Family History Leighton Ruff, MD; 11/23/5275 5:25 PM) Hypertension Brother, Father, Mother. Ischemic Bowel Disease Sister.  Pregnancy / Birth History Leighton Ruff, MD; 02/16/4234 5:25 PM) Age at menarche 73 years. Contraceptive History Depo-provera, Oral contraceptives. Gravida 5 Irregular periods Length (months) of breastfeeding 12-24 Maternal age 60-20 Para 34  Other Problems Leighton Ruff, MD; 09/20/1441 5:25 PM) Anxiety Disorder Back Pain Crohn's Disease Depression Gastroesophageal Reflux Disease High blood pressure     Review of Systems Leighton Ruff MD; 07/22/4006 5:25 PM) General Present- Fatigue and Weight Loss. Not Present- Appetite Loss, Chills, Fever, Night Sweats and Weight Gain. Skin Not Present- Change in Wart/Mole, Dryness, Hives, Jaundice, New Lesions, Non-Healing Wounds, Rash and Ulcer. HEENT Not Present- Earache, Hearing Loss, Hoarseness, Nose Bleed, Oral Ulcers, Ringing in the Ears, Seasonal Allergies, Sinus Pain, Sore Throat, Visual Disturbances, Wears glasses/contact lenses and Yellow Eyes. Respiratory Not Present- Bloody sputum, Chronic Cough, Difficulty Breathing, Snoring and Wheezing. Breast Not Present- Breast Mass, Breast Pain, Nipple Discharge and Skin Changes. Cardiovascular Not Present- Chest Pain, Difficulty Breathing Lying  Down, Leg Cramps, Palpitations, Rapid Heart Rate, Shortness of Breath and Swelling of Extremities. Gastrointestinal Present- Abdominal Pain, Bloating, Change in Bowel Habits and Nausea. Not Present- Bloody Stool, Chronic diarrhea,  Constipation, Difficulty Swallowing, Excessive gas, Gets full quickly at meals, Hemorrhoids, Indigestion, Rectal Pain and Vomiting. Female Genitourinary Present- Painful Urination and Pelvic Pain. Not Present- Frequency, Nocturia and Urgency. Musculoskeletal Present- Back Pain. Not Present- Joint Pain, Joint Stiffness, Muscle Pain, Muscle Weakness and Swelling of Extremities. Neurological Not Present- Decreased Memory, Fainting, Headaches, Numbness, Seizures, Tingling, Tremor, Trouble walking and Weakness. Psychiatric Present- Anxiety, Depression and Fearful. Not Present- Bipolar, Change in Sleep Pattern and Frequent crying. Endocrine Present- Hair Changes. Not Present- Cold Intolerance, Excessive Hunger, Heat Intolerance, Hot flashes and New Diabetes. Hematology Not Present- Blood Thinners, Easy Bruising, Excessive bleeding, Gland problems, HIV and Persistent Infections.  Vitals [employer] RMA; 12/16/2016 4:29 PM) 12/16/2016 4:29 PM Weight: 126 lb Height: 64in Body Surface Area: 1.61 m Body Mass Index: 21.63 kg/m  Temp.: 98.62F  Pulse: 77 (Regular)  P.OX: 99% (Room air) BP: 128/90 (Sitting, Left Arm, Standard)      Physical Exam Leighton Ruff MD; 02/20/7618 5:25 PM)  General Mental Status-Alert. General Appearance-Not in acute distress. Build & Nutrition-Well nourished. Posture-Normal posture. Gait-Normal.  Head and Neck Head-normocephalic, atraumatic with no lesions or palpable masses. Trachea-midline.  Chest and Lung Exam Chest and lung exam reveals -on auscultation, normal breath sounds, no adventitious sounds and normal vocal resonance.  Cardiovascular Cardiovascular examination reveals -normal heart sounds, regular  rate and rhythm with no murmurs and no digital clubbing, cyanosis, edema, increased warmth or tenderness.  Abdomen Inspection Inspection of the abdomen reveals - No Hernias. Palpation/Percussion Palpation and Percussion of the abdomen reveal - Soft, Non Tender, No Rigidity (guarding), No hepatosplenomegaly and No Palpable abdominal masses.  Neurologic Neurologic evaluation reveals -alert and oriented x 3 with no impairment of recent or remote memory, normal attention span and ability to concentrate, normal sensation and normal coordination.  Musculoskeletal Normal Exam - Bilateral-Upper Extremity Strength Normal and Lower Extremity Strength Normal.    Assessment & Plan Leighton Ruff MD; 5/0/9326 5:07 PM)  CROHN'S DISEASE OF SMALL INTESTINE WITH OTHER COMPLICATION (Z12.458) Impression: 51 year old female who presents to the office for follow-up for Crohn's disease. She continues to use her biologic, Entyvio. She continues to have symptoms of stricture. On CT enterography she has signs of terminal ileitis. I have recommended ileocececectomy. It appears that her left ovary is not involved but we discussed the possibility of removing this if it was too involved in the inflammation. We also discussed possible stricturoplasty if further strictures were noted in her small bowel. We discussed possible small bowel resection and cecectomy. We will plan on doing this approximately 6-8 weeks after her next Entyvio dose. The surgery and anatomy were described to the patient as well as the risks of surgery and the possible complications. These include: Bleeding, deep abdominal infections and possible wound complications such as hernia and infection, damage to adjacent structures, leak of surgical connections, which can lead to other surgeries and possibly an ostomy, possible need for other procedures, such as abscess drains in radiology, possible prolonged hospital stay, possible diarrhea from removal of  part of the colon, possible constipation from narcotics, possible bowel, bladder or sexual dysfunction if having rectal surgery, prolonged fatigue/weakness or appetite loss, possible early recurrence of of disease, possible complications of their medical problems such as heart disease or arrhythmias or lung problems, death (less than 1%). I believe the patient understands and wishes to proceed with the surgery.

## 2016-12-21 ENCOUNTER — Encounter (HOSPITAL_COMMUNITY): Payer: Medicaid Other

## 2016-12-21 ENCOUNTER — Other Ambulatory Visit: Payer: Self-pay

## 2016-12-21 ENCOUNTER — Other Ambulatory Visit (HOSPITAL_COMMUNITY): Payer: Self-pay | Admitting: *Deleted

## 2016-12-21 DIAGNOSIS — K50919 Crohn's disease, unspecified, with unspecified complications: Secondary | ICD-10-CM

## 2016-12-22 ENCOUNTER — Ambulatory Visit (HOSPITAL_COMMUNITY)
Admission: RE | Admit: 2016-12-22 | Discharge: 2016-12-22 | Disposition: A | Discharge status: Home / Self Care | Payer: Medicaid Other | Source: Ambulatory Visit | Attending: Gastroenterology | Admitting: Gastroenterology

## 2016-12-22 DIAGNOSIS — K50919 Crohn's disease, unspecified, with unspecified complications: Secondary | ICD-10-CM | POA: Diagnosis not present

## 2016-12-22 MED ORDER — VEDOLIZUMAB 300 MG IV SOLR
300.0000 mg | Freq: Once | INTRAVENOUS | Status: AC
  Administered 2016-12-22: 09:00:00 300 mg via INTRAVENOUS
  Filled 2016-12-22: qty 5

## 2016-12-22 MED ORDER — SODIUM CHLORIDE 0.9 % IV SOLN
Freq: Once | INTRAVENOUS | Status: AC
  Administered 2016-12-22: 09:00:00 via INTRAVENOUS

## 2016-12-29 ENCOUNTER — Telehealth: Payer: Self-pay | Admitting: Gastroenterology

## 2016-12-29 NOTE — Telephone Encounter (Signed)
Routed to Dr. Loletha Carrow

## 2016-12-30 ENCOUNTER — Other Ambulatory Visit: Payer: Self-pay

## 2016-12-30 ENCOUNTER — Telehealth: Payer: Self-pay | Admitting: Gastroenterology

## 2016-12-30 MED ORDER — OXYCODONE HCL 5 MG PO TABS: 5.0000 mg | ORAL_TABLET | Freq: Four times a day (QID) | ORAL | 0 refills | Status: DC | PRN

## 2016-12-30 MED ORDER — PREDNISONE 5 MG PO TABS: ORAL_TABLET | ORAL | 0 refills | Status: DC

## 2016-12-30 NOTE — Telephone Encounter (Signed)
I placed the prescription for oxycodone in Marissa Blankenship's chart.  Please print it when she comes to clinic today.

## 2016-12-30 NOTE — Telephone Encounter (Signed)
She needs a short course of prednisone to calm this down.  But I want the prednisone finished at least a couple weeks before the surgery.  40 mg once daily x 5 days, 30 mg once daily x 5 days, 20 mg once daily x 5 days, 10 mg once daily x 5 days, 5 mg once daily x 5 days, then stop.  Please send a new Rx for prednisone if needed (i do not know how much she has at home).

## 2016-12-30 NOTE — Telephone Encounter (Signed)
Prednisone Rx faxed to patient's pharmacy, she understands that this must be completed at least a couple weeks before she has surgery on August 2.

## 2016-12-30 NOTE — Telephone Encounter (Signed)
Patient was given instructions, she states that she is having periodic episodes of vomiting. Last one was on Sunday. Patient asked about taking Miralax, more often. Last ov note sounded like she was taking it prn. I instructed her that she will need to take it daily, especially if taking pain medication. I let patient know that your directions, if she is vomiting, would be to go to the ED for worsening obstruction. Patient states that the vomiting is intermittent and did not want to go to the ED. She will come by office to pick up Rx for pain med this afternoon.

## 2016-12-30 NOTE — Telephone Encounter (Signed)
That sounds like the partial obstructive symptoms she has been having from her Crohn's.  It is a good thing that her surgery is scheduled soon (early August).  I am at the hospital this AM but will return to the office in the afternoon.  If she can come by the office sometime between 2 and 4pm today, I will have a prescription ready for her for some pain medicine.  It will need to be a paper Rx b/c opioid med.  If she starts vomiting she needs to go to the ED because it could be a worsening of obstruction.  - HD

## 2017-01-02 ENCOUNTER — Telehealth: Payer: Self-pay | Admitting: Gastroenterology

## 2017-01-02 NOTE — Telephone Encounter (Signed)
Routed to [place] to get prior authorization.

## 2017-01-03 ENCOUNTER — Other Ambulatory Visit: Payer: Self-pay

## 2017-01-03 ENCOUNTER — Other Ambulatory Visit: Payer: Self-pay | Admitting: Gastroenterology

## 2017-01-03 MED ORDER — ACETAMINOPHEN-CODEINE #3 300-30 MG PO TABS: 1.0000 | ORAL_TABLET | Freq: Four times a day (QID) | ORAL | 0 refills | Status: DC | PRN

## 2017-01-03 NOTE — Telephone Encounter (Signed)
Per communication with Dr Loletha Carrow, he will issue a new RX for Tylenol #3 as the oxycodone needs a prior auth. Left a message to inform pt of RX change, prescription should be ready this afternoon for pick up.

## 2017-01-29 ENCOUNTER — Emergency Department (HOSPITAL_COMMUNITY): Payer: Medicaid Other

## 2017-01-29 ENCOUNTER — Encounter (HOSPITAL_COMMUNITY): Payer: Self-pay

## 2017-01-29 ENCOUNTER — Inpatient Hospital Stay (HOSPITAL_COMMUNITY)
Admission: EM | Admit: 2017-01-29 | Discharge: 2017-02-03 | DRG: 329 | Disposition: A | Discharge status: Home / Self Care | Payer: Medicaid Other | Attending: Internal Medicine | Admitting: Internal Medicine

## 2017-01-29 DIAGNOSIS — D509 Iron deficiency anemia, unspecified: Secondary | ICD-10-CM | POA: Diagnosis present

## 2017-01-29 DIAGNOSIS — E871 Hypo-osmolality and hyponatremia: Secondary | ICD-10-CM | POA: Diagnosis not present

## 2017-01-29 DIAGNOSIS — I10 Essential (primary) hypertension: Secondary | ICD-10-CM | POA: Diagnosis present

## 2017-01-29 DIAGNOSIS — D649 Anemia, unspecified: Secondary | ICD-10-CM | POA: Diagnosis present

## 2017-01-29 DIAGNOSIS — K50012 Crohn's disease of small intestine with intestinal obstruction: Principal | ICD-10-CM | POA: Diagnosis present

## 2017-01-29 DIAGNOSIS — R638 Other symptoms and signs concerning food and fluid intake: Secondary | ICD-10-CM | POA: Diagnosis present

## 2017-01-29 DIAGNOSIS — Z87891 Personal history of nicotine dependence: Secondary | ICD-10-CM

## 2017-01-29 DIAGNOSIS — N390 Urinary tract infection, site not specified: Secondary | ICD-10-CM | POA: Diagnosis present

## 2017-01-29 DIAGNOSIS — K50912 Crohn's disease, unspecified, with intestinal obstruction: Secondary | ICD-10-CM

## 2017-01-29 DIAGNOSIS — R001 Bradycardia, unspecified: Secondary | ICD-10-CM | POA: Diagnosis present

## 2017-01-29 DIAGNOSIS — K589 Irritable bowel syndrome without diarrhea: Secondary | ICD-10-CM | POA: Diagnosis present

## 2017-01-29 DIAGNOSIS — D72829 Elevated white blood cell count, unspecified: Secondary | ICD-10-CM

## 2017-01-29 DIAGNOSIS — N898 Other specified noninflammatory disorders of vagina: Secondary | ICD-10-CM | POA: Diagnosis present

## 2017-01-29 DIAGNOSIS — Z6822 Body mass index (BMI) 22.0-22.9, adult: Secondary | ICD-10-CM

## 2017-01-29 DIAGNOSIS — K56609 Unspecified intestinal obstruction, unspecified as to partial versus complete obstruction: Secondary | ICD-10-CM

## 2017-01-29 DIAGNOSIS — B9620 Unspecified Escherichia coli [E. coli] as the cause of diseases classified elsewhere: Secondary | ICD-10-CM | POA: Diagnosis present

## 2017-01-29 DIAGNOSIS — Z8379 Family history of other diseases of the digestive system: Secondary | ICD-10-CM

## 2017-01-29 DIAGNOSIS — Z9225 Personal history of immunosupression therapy: Secondary | ICD-10-CM

## 2017-01-29 DIAGNOSIS — E43 Unspecified severe protein-calorie malnutrition: Secondary | ICD-10-CM | POA: Diagnosis present

## 2017-01-29 DIAGNOSIS — N736 Female pelvic peritoneal adhesions (postinfective): Secondary | ICD-10-CM | POA: Diagnosis present

## 2017-01-29 DIAGNOSIS — K219 Gastro-esophageal reflux disease without esophagitis: Secondary | ICD-10-CM | POA: Diagnosis present

## 2017-01-29 LAB — URINALYSIS, ROUTINE W REFLEX MICROSCOPIC
BILIRUBIN URINE: NEGATIVE
Glucose, UA: NEGATIVE mg/dL
KETONES UR: 20 mg/dL — AB
Nitrite: POSITIVE — AB
PH: 7 (ref 5.0–8.0)
Protein, ur: NEGATIVE mg/dL
SPECIFIC GRAVITY, URINE: 1.011 (ref 1.005–1.030)

## 2017-01-29 LAB — COMPREHENSIVE METABOLIC PANEL
ALBUMIN: 3.4 g/dL — AB (ref 3.5–5.0)
ALT: 13 U/L — ABNORMAL LOW (ref 14–54)
AST: 15 U/L (ref 15–41)
Alkaline Phosphatase: 62 U/L (ref 38–126)
Anion gap: 9 (ref 5–15)
BUN: 8 mg/dL (ref 6–20)
CHLORIDE: 102 mmol/L (ref 101–111)
CO2: 25 mmol/L (ref 22–32)
CREATININE: 0.64 mg/dL (ref 0.44–1.00)
Calcium: 8.7 mg/dL — ABNORMAL LOW (ref 8.9–10.3)
GFR calc Af Amer: >60 mL/min (ref >60)
GLUCOSE: 98 mg/dL (ref 65–99)
Potassium: 3.6 mmol/L (ref 3.5–5.1)
SODIUM: 136 mmol/L (ref 135–145)
Total Bilirubin: 0.3 mg/dL (ref 0.3–1.2)
Total Protein: 6.9 g/dL (ref 6.5–8.1)

## 2017-01-29 LAB — CBC
HEMATOCRIT: 34.9 % — AB (ref 36.0–46.0)
Hemoglobin: 11.2 g/dL — ABNORMAL LOW (ref 12.0–15.0)
MCH: 25.2 pg — AB (ref 26.0–34.0)
MCHC: 32.1 g/dL (ref 30.0–36.0)
MCV: 78.6 fL (ref 78.0–100.0)
PLATELETS: 284 10*3/uL (ref 150–400)
RBC: 4.44 MIL/uL (ref 3.87–5.11)
RDW: 18.2 % — AB (ref 11.5–15.5)
WBC: 7.8 10*3/uL (ref 4.0–10.5)

## 2017-01-29 LAB — I-STAT BETA HCG BLOOD, ED (MC, WL, AP ONLY)

## 2017-01-29 LAB — LIPASE, BLOOD: LIPASE: 10 U/L — AB (ref 11–51)

## 2017-01-29 MED ORDER — ONDANSETRON HCL 4 MG/2ML IJ SOLN
4.0000 mg | Freq: Once | INTRAMUSCULAR | Status: AC
  Administered 2017-01-29: 4 mg via INTRAVENOUS
  Filled 2017-01-29: qty 2

## 2017-01-29 MED ORDER — MORPHINE SULFATE (PF) 4 MG/ML IV SOLN
4.0000 mg | Freq: Once | INTRAVENOUS | Status: AC
  Administered 2017-01-29: 4 mg via INTRAVENOUS
  Filled 2017-01-29: qty 1

## 2017-01-29 MED ORDER — IOPAMIDOL (ISOVUE-300) INJECTION 61%
INTRAVENOUS | Status: AC
  Administered 2017-01-30: 100 mL via INTRAVENOUS
  Filled 2017-01-29: qty 100

## 2017-01-29 MED ORDER — IOPAMIDOL (ISOVUE-300) INJECTION 61%
INTRAVENOUS | Status: AC
  Administered 2017-01-30
  Filled 2017-01-29: qty 30

## 2017-01-29 MED ORDER — IOPAMIDOL (ISOVUE-300) INJECTION 61%
30.0000 mL | Freq: Once | INTRAVENOUS | Status: AC | PRN
  Administered 2017-01-29: 30 mL via ORAL

## 2017-01-29 NOTE — ED Notes (Signed)
ED Provider at bedside. 

## 2017-01-29 NOTE — ED Provider Notes (Signed)
[facility] DEPT Provider Note   CSN: [id number] Arrival date & time: 01/29/17  1932     History   Chief Complaint Chief Complaint  Patient presents with  . Abdominal Pain  . Emesis    HPI Marissa Blankenship is a 51 y.o. female.  The history is provided by the patient.  Abdominal Pain   This is a recurrent problem. Episode onset: 2 weeks. The problem occurs daily. The problem has not changed since onset.The pain is located in the generalized abdominal region. The quality of the pain is sharp, aching, cramping and colicky. The pain is moderate. Associated symptoms include nausea and vomiting. Pertinent negatives include fever, diarrhea, hematochezia, melena, constipation and dysuria. The symptoms are aggravated by eating. Relieved by: bentyl, tylenol#3. Her past medical history is significant for Crohn's disease and irritable bowel syndrome.  Emesis   Associated symptoms include abdominal pain. Pertinent negatives include no diarrhea and no fever.   Similar to prior IBS and Crohn's pain but had brief, severe, sharp pain yesterday and today that concerned her.   Past Medical History:  Diagnosis Date  . Acid reflux   . Allergy   . Anemia   . Anxiety   . Crohn's disease ([place])   . Depression   . Hypertension   . IBS (irritable bowel syndrome)    spastic colon  . Shortness of breath     Patient Active Problem List   Diagnosis Date Noted  . Diarrhea   . RLQ abdominal pain   . Protein-calorie malnutrition, severe 12/02/2015  . Crohn's disease ([place]) 12/01/2015  . UTI (lower urinary tract infection) 12/01/2015  . Hypokalemia 12/01/2015  . Inadequate oral intake 12/01/2015  . Tachycardia 05/26/2011  . Dyspnea 05/26/2011  . HYPOKALEMIA 05/31/2010  . TINNITUS 05/25/2010  . DELAYED MENSES 05/25/2010  . PLANTAR FASCIITIS, LEFT 05/25/2010  . PRE-ECLAMPSIA 06/18/2007  . Essential hypertension 03/28/2007  . ACID REFLUX DISEASE 03/28/2007    Past Surgical History:  Procedure  Laterality Date  . CESAREAN SECTION    . COLONOSCOPY    . COLONOSCOPY WITH PROPOFOL N/A 12/02/2015   Procedure: COLONOSCOPY WITH PROPOFOL;  Surgeon: Doran Stabler, MD;  Location: [facility];  Service: Gastroenterology;  Laterality: N/A;  . ESOPHAGOGASTRODUODENOSCOPY    . UPPER GASTROINTESTINAL ENDOSCOPY    . WISDOM TOOTH EXTRACTION      OB History    Gravida Para Term Preterm AB Living   6 4   3 1 3    SAB TAB Ectopic Multiple Live Births     1     4       Home Medications    Prior to Admission medications   Medication Sig Start Date End Date Taking? Authorizing Provider  acetaminophen-codeine (TYLENOL #3) 300-30 MG tablet Take 1-2 tablets by mouth every 6 (six) hours as needed for moderate pain. 01/03/17   [name], MD  amLODipine (NORVASC) 10 MG tablet Take 1 tablet by mouth daily. 07/26/16   [provider]  calcium-vitamin D (OSCAL WITH D) 500-200 MG-UNIT tablet Take 1 tablet by mouth 2 (two) times daily. 05/31/16   Doran Stabler, MD  dicyclomine (BENTYL) 20 MG tablet Take 1 tablet (20 mg total) by mouth 3 (three) times daily as needed (for abdominal pain.). 12/06/16   Doran Stabler, MD  ferrous sulfate 325 (65 FE) MG tablet TAKE 1 TABLET BY ORAL ROUTE DAILY FOR 90 DAYS 09/30/16   [provider]  HYDROcodone-acetaminophen [facility])  5-325 MG tablet Take 1-2 tablets by mouth every 6 (six) hours as needed for moderate pain. 06/07/16   Doran Stabler, MD  lisinopril (PRINIVIL,ZESTRIL) 40 MG tablet Take 1 tablet (40 mg total) by mouth every morning. 12/05/15   Eugenie Filler, MD  metoprolol (LOPRESSOR) 50 MG tablet Take 1 tablet by mouth daily. 07/27/16   [provider]  ondansetron (ZOFRAN) 4 MG tablet Take 1 tablet (4 mg total) by mouth every 8 (eight) hours as needed for nausea or vomiting. 09/15/16   Doran Stabler, MD  pantoprazole (PROTONIX) 40 MG tablet Take 40 mg by mouth daily. 10/27/15   [provider]  potassium  chloride (MICRO-K) 10 MEQ CR capsule Take 1 capsule by mouth daily. 06/23/16   [provider]  predniSONE (DELTASONE) 5 MG tablet Take 40 mg (8 tabs daily) by mouth for 5 days, then 30 mg (6 tabs daily) for 5 days, then 20 mg (4 tabs daily) for 5 days, then 10 mg (2 tabs daily) for 5 days, then 5 mg (1 tab daily) for 5 days. 12/30/16   Doran Stabler, MD  simethicone (MYLICON) 80 MG chewable tablet Chew 80 mg by mouth every 6 (six) hours as needed for flatulence.    [provider]    Family History Family History  Problem Relation Age of Onset  . Hypertension Father 50  . Hypertension Mother 20  . Crohn's disease Sister   . Diabetes Paternal Grandmother   . Hypertension Brother   . Colon cancer Neg Hx   . Pancreatic cancer Neg Hx   . Stomach cancer Neg Hx   . Esophageal cancer Neg Hx   . Rectal cancer Neg Hx     Social History Social History  Substance Use Topics  . Smoking status: Former Smoker    Types: Cigarettes    Quit date: 07/18/1997  . Smokeless tobacco: Never Used  . Alcohol use No     Allergies   Patient has no known allergies.   Review of Systems Review of Systems  Constitutional: Negative for fever.  Gastrointestinal: Positive for abdominal pain, nausea and vomiting. Negative for constipation, diarrhea, hematochezia and melena.  Genitourinary: Negative for dysuria.     Physical Exam Updated Vital Signs BP 109/71 (BP Location: Left Arm)   Pulse 60   Temp 98.3 F (36.8 C) (Oral)   Resp 14   SpO2 99%   Physical Exam  Constitutional: She is oriented to person, place, and time. She appears well-developed and well-nourished. No distress.  HENT:  Head: Normocephalic and atraumatic.  Nose: Nose normal.  Eyes: Pupils are equal, round, and reactive to light. Conjunctivae and EOM are normal. Right eye exhibits no discharge. Left eye exhibits no discharge. No scleral icterus.  Neck: Normal range of motion. Neck supple.  Cardiovascular:  Normal rate and regular rhythm.  Exam reveals no gallop and no friction rub.   No murmur heard. Pulmonary/Chest: Effort normal and breath sounds normal. No stridor. No respiratory distress. She has no rales.  Abdominal: Soft. She exhibits no distension. There is generalized tenderness. There is no rigidity, no rebound, no guarding, no CVA tenderness, no tenderness at McBurney's point and negative Murphy's sign. No hernia.  Musculoskeletal: She exhibits no edema or tenderness.  Neurological: She is alert and oriented to person, place, and time.  Skin: Skin is warm and dry. No rash noted. She is not diaphoretic. No erythema.  Psychiatric: She has a normal  mood and affect.  Vitals reviewed.    ED Treatments / Results  Labs (all labs ordered are listed, but only abnormal results are displayed) Labs Reviewed  LIPASE, BLOOD - Abnormal; Notable for the following:       Result Value   Lipase 10 (*)    All other components within normal limits  COMPREHENSIVE METABOLIC PANEL - Abnormal; Notable for the following:    Calcium 8.7 (*)    Albumin 3.4 (*)    ALT 13 (*)    All other components within normal limits  CBC - Abnormal; Notable for the following:    Hemoglobin 11.2 (*)    HCT 34.9 (*)    MCH 25.2 (*)    RDW 18.2 (*)    All other components within normal limits  URINALYSIS, ROUTINE W REFLEX MICROSCOPIC - Abnormal; Notable for the following:    Hgb urine dipstick SMALL (*)    Ketones, ur 20 (*)    Nitrite POSITIVE (*)    Leukocytes, UA SMALL (*)    Bacteria, UA FEW (*)    Squamous Epithelial / LPF 0-5 (*)    All other components within normal limits  I-STAT BETA HCG BLOOD, ED ([facility], [facility], [facility] ONLY)    EKG  EKG Interpretation None       Radiology No results found.  Procedures Procedures (including critical care time)  Medications Ordered in ED Medications  iopamidol (ISOVUE-300) 61 % injection (not administered)  iopamidol (ISOVUE-300) 61 % injection (not administered)    morphine 4 MG/ML injection 4 mg (4 mg Intravenous Given 01/29/17 2236)  ondansetron (ZOFRAN) injection 4 mg (4 mg Intravenous Given 01/29/17 2236)  iopamidol (ISOVUE-300) 61 % injection 30 mL (30 mLs Oral Contrast Given 01/29/17 2250)     Initial Impression / Assessment and Plan / ED Course  I have reviewed the triage vital signs and the nursing notes.  Pertinent labs & imaging results that were available during my care of the patient were reviewed by me and considered in my medical decision making (see chart for details).     Diffuse abdominal pain and tenderness in the patient with a history of Crohn's. Labs are grossly reassuring without leukocytosis or electrolyte derangements. No renal insufficiency. However given the new type of abdominal pain with diffuse tenderness, we'll obtain a CT scan to assess for any change/worsening of her bowel inflammation.  UA was obtained in triage and revealed positive nitrites and leukocytes however patient is denying any urinary symptoms, but did state that her urine has been cloudy for several days. Will treat for possible UTI.  Patient care turned over to Dr Betsey Holiday at [facility]. Patient case and results discussed in detail; please see their note for further ED managment.      Fatima Blank, MD 01/30/17 [phone]

## 2017-01-29 NOTE — ED Triage Notes (Signed)
Pt states that she has been experiencing generalized abdominal pain and N/V x3 days. Denies diarrhea. A&Ox4. Ambulatory.

## 2017-01-30 ENCOUNTER — Encounter (HOSPITAL_COMMUNITY): Payer: Self-pay

## 2017-01-30 ENCOUNTER — Emergency Department (HOSPITAL_COMMUNITY): Payer: Medicaid Other

## 2017-01-30 DIAGNOSIS — N390 Urinary tract infection, site not specified: Secondary | ICD-10-CM | POA: Diagnosis present

## 2017-01-30 DIAGNOSIS — D509 Iron deficiency anemia, unspecified: Secondary | ICD-10-CM | POA: Diagnosis present

## 2017-01-30 DIAGNOSIS — R001 Bradycardia, unspecified: Secondary | ICD-10-CM | POA: Diagnosis present

## 2017-01-30 DIAGNOSIS — K219 Gastro-esophageal reflux disease without esophagitis: Secondary | ICD-10-CM | POA: Diagnosis present

## 2017-01-30 DIAGNOSIS — Z8379 Family history of other diseases of the digestive system: Secondary | ICD-10-CM | POA: Diagnosis not present

## 2017-01-30 DIAGNOSIS — N736 Female pelvic peritoneal adhesions (postinfective): Secondary | ICD-10-CM | POA: Diagnosis present

## 2017-01-30 DIAGNOSIS — E871 Hypo-osmolality and hyponatremia: Secondary | ICD-10-CM | POA: Diagnosis not present

## 2017-01-30 DIAGNOSIS — D649 Anemia, unspecified: Secondary | ICD-10-CM | POA: Diagnosis present

## 2017-01-30 DIAGNOSIS — E43 Unspecified severe protein-calorie malnutrition: Secondary | ICD-10-CM | POA: Diagnosis present

## 2017-01-30 DIAGNOSIS — K50012 Crohn's disease of small intestine with intestinal obstruction: Secondary | ICD-10-CM | POA: Diagnosis present

## 2017-01-30 DIAGNOSIS — R638 Other symptoms and signs concerning food and fluid intake: Secondary | ICD-10-CM | POA: Diagnosis not present

## 2017-01-30 DIAGNOSIS — K50912 Crohn's disease, unspecified, with intestinal obstruction: Secondary | ICD-10-CM | POA: Diagnosis not present

## 2017-01-30 DIAGNOSIS — Z9225 Personal history of immunosupression therapy: Secondary | ICD-10-CM | POA: Diagnosis not present

## 2017-01-30 DIAGNOSIS — I10 Essential (primary) hypertension: Secondary | ICD-10-CM | POA: Diagnosis present

## 2017-01-30 DIAGNOSIS — N898 Other specified noninflammatory disorders of vagina: Secondary | ICD-10-CM | POA: Diagnosis present

## 2017-01-30 DIAGNOSIS — K589 Irritable bowel syndrome without diarrhea: Secondary | ICD-10-CM | POA: Diagnosis present

## 2017-01-30 DIAGNOSIS — Z6822 Body mass index (BMI) 22.0-22.9, adult: Secondary | ICD-10-CM | POA: Diagnosis not present

## 2017-01-30 DIAGNOSIS — R1084 Generalized abdominal pain: Secondary | ICD-10-CM | POA: Diagnosis present

## 2017-01-30 DIAGNOSIS — D72829 Elevated white blood cell count, unspecified: Secondary | ICD-10-CM | POA: Diagnosis not present

## 2017-01-30 DIAGNOSIS — R933 Abnormal findings on diagnostic imaging of other parts of digestive tract: Secondary | ICD-10-CM | POA: Diagnosis not present

## 2017-01-30 DIAGNOSIS — B9620 Unspecified Escherichia coli [E. coli] as the cause of diseases classified elsewhere: Secondary | ICD-10-CM | POA: Diagnosis present

## 2017-01-30 DIAGNOSIS — Z87891 Personal history of nicotine dependence: Secondary | ICD-10-CM | POA: Diagnosis not present

## 2017-01-30 LAB — CBC WITH DIFFERENTIAL/PLATELET
Basophils Absolute: 0 10*3/uL (ref 0.0–0.1)
Basophils Relative: 0 %
EOS ABS: 0.1 10*3/uL (ref 0.0–0.7)
Eosinophils Relative: 2 %
HCT: 36.3 % (ref 36.0–46.0)
Hemoglobin: 11.9 g/dL — ABNORMAL LOW (ref 12.0–15.0)
LYMPHS ABS: 0.9 10*3/uL (ref 0.7–4.0)
Lymphocytes Relative: 11 %
MCH: 26 pg (ref 26.0–34.0)
MCHC: 32.8 g/dL (ref 30.0–36.0)
MCV: 79.3 fL (ref 78.0–100.0)
Monocytes Absolute: 0.2 10*3/uL (ref 0.1–1.0)
Monocytes Relative: 3 %
Neutro Abs: 6.7 10*3/uL (ref 1.7–7.7)
Neutrophils Relative %: 84 %
PLATELETS: 273 10*3/uL (ref 150–400)
RBC: 4.58 MIL/uL (ref 3.87–5.11)
RDW: 18.4 % — ABNORMAL HIGH (ref 11.5–15.5)
WBC: 7.9 10*3/uL (ref 4.0–10.5)

## 2017-01-30 LAB — MAGNESIUM: MAGNESIUM: 2.2 mg/dL (ref 1.7–2.4)

## 2017-01-30 LAB — PREALBUMIN: Prealbumin: 21.5 mg/dL (ref 18–38)

## 2017-01-30 LAB — BASIC METABOLIC PANEL
Anion gap: 11 (ref 5–15)
BUN: 6 mg/dL (ref 6–20)
CHLORIDE: 102 mmol/L (ref 101–111)
CO2: 24 mmol/L (ref 22–32)
CREATININE: 0.57 mg/dL (ref 0.44–1.00)
Calcium: 8.8 mg/dL — ABNORMAL LOW (ref 8.9–10.3)
GFR calc Af Amer: >60 mL/min (ref >60)
GFR calc non Af Amer: >60 mL/min (ref >60)
Glucose, Bld: 85 mg/dL (ref 65–99)
Potassium: 3.6 mmol/L (ref 3.5–5.1)
SODIUM: 137 mmol/L (ref 135–145)

## 2017-01-30 MED ORDER — METRONIDAZOLE 500 MG PO TABS: 1000.0000 mg | ORAL_TABLET | ORAL | Status: DC

## 2017-01-30 MED ORDER — ALVIMOPAN 12 MG PO CAPS: 12.0000 mg | ORAL_CAPSULE | Freq: Once | ORAL | Status: DC

## 2017-01-30 MED ORDER — METHYLPREDNISOLONE SODIUM SUCC 125 MG IJ SOLR
125.0000 mg | Freq: Once | INTRAMUSCULAR | Status: AC
  Administered 2017-01-30: 125 mg via INTRAVENOUS
  Filled 2017-01-30: qty 2

## 2017-01-30 MED ORDER — ACETAMINOPHEN 500 MG PO TABS
1000.0000 mg | ORAL_TABLET | ORAL | Status: AC
  Administered 2017-01-31: 1000 mg via ORAL
  Filled 2017-01-30: qty 2

## 2017-01-30 MED ORDER — ENOXAPARIN SODIUM 40 MG/0.4ML ~~LOC~~ SOLN
40.0000 mg | SUBCUTANEOUS | Status: DC
  Administered 2017-01-30: 40 mg via SUBCUTANEOUS
  Filled 2017-01-30: qty 0.4

## 2017-01-30 MED ORDER — ENOXAPARIN SODIUM 40 MG/0.4ML ~~LOC~~ SOLN
40.0000 mg | SUBCUTANEOUS | Status: DC
  Administered 2017-02-01 – 2017-02-02 (×2): 40 mg via SUBCUTANEOUS
  Filled 2017-01-30 (×2): qty 0.4

## 2017-01-30 MED ORDER — METRONIDAZOLE 500 MG PO TABS
1000.0000 mg | ORAL_TABLET | ORAL | Status: AC
  Administered 2017-01-30 (×3): 1000 mg via ORAL
  Filled 2017-01-30 (×2): qty 2

## 2017-01-30 MED ORDER — ENOXAPARIN SODIUM 40 MG/0.4ML ~~LOC~~ SOLN
40.0000 mg | SUBCUTANEOUS | Status: AC
  Administered 2017-01-31: 40 mg via SUBCUTANEOUS
  Filled 2017-01-30: qty 0.4

## 2017-01-30 MED ORDER — PANTOPRAZOLE SODIUM 40 MG IV SOLR
40.0000 mg | Freq: Every day | INTRAVENOUS | Status: DC
  Administered 2017-01-30 – 2017-02-01 (×4): 40 mg via INTRAVENOUS
  Filled 2017-01-30 (×4): qty 40

## 2017-01-30 MED ORDER — DEXTROSE 5 % IV SOLN
2.0000 g | INTRAVENOUS | Status: AC
  Administered 2017-01-31: 2 g via INTRAVENOUS
  Filled 2017-01-30: qty 2

## 2017-01-30 MED ORDER — ENSURE PRE-SURGERY PO LIQD
296.0000 mL | Freq: Once | ORAL | Status: AC
  Administered 2017-01-31: 296 mL via ORAL
  Filled 2017-01-30: qty 296

## 2017-01-30 MED ORDER — CHLORHEXIDINE GLUCONATE CLOTH 2 % EX PADS
6.0000 | MEDICATED_PAD | Freq: Once | CUTANEOUS | Status: AC
  Administered 2017-01-31: 6 via TOPICAL

## 2017-01-30 MED ORDER — NEOMYCIN SULFATE 500 MG PO TABS
1000.0000 mg | ORAL_TABLET | ORAL | Status: AC
  Administered 2017-01-30 (×3): 1000 mg via ORAL
  Filled 2017-01-30 (×4): qty 2

## 2017-01-30 MED ORDER — SODIUM CHLORIDE 0.9 % IV SOLN: 1.0000 "application " | INTRAVENOUS | Status: DC

## 2017-01-30 MED ORDER — ENSURE SURGERY PO LIQD
237.0000 mL | Freq: Two times a day (BID) | ORAL | Status: DC
  Filled 2017-01-30 (×3): qty 237

## 2017-01-30 MED ORDER — ONDANSETRON HCL 4 MG/2ML IJ SOLN
4.0000 mg | Freq: Four times a day (QID) | INTRAMUSCULAR | Status: DC | PRN
  Administered 2017-01-30 – 2017-02-02 (×5): 4 mg via INTRAVENOUS
  Filled 2017-01-30 (×5): qty 2

## 2017-01-30 MED ORDER — ENOXAPARIN SODIUM 40 MG/0.4ML ~~LOC~~ SOLN: 40.0000 mg | Freq: Once | SUBCUTANEOUS | Status: DC

## 2017-01-30 MED ORDER — ORAL CARE MOUTH RINSE
15.0000 mL | Freq: Two times a day (BID) | OROMUCOSAL | Status: DC
  Administered 2017-01-30 – 2017-02-03 (×7): 15 mL via OROMUCOSAL

## 2017-01-30 MED ORDER — MORPHINE SULFATE (PF) 2 MG/ML IV SOLN
2.0000 mg | INTRAVENOUS | Status: DC | PRN
  Administered 2017-01-30 (×2): 2 mg via INTRAVENOUS
  Filled 2017-01-30 (×2): qty 1

## 2017-01-30 MED ORDER — METHYLPREDNISOLONE SODIUM SUCC 125 MG IJ SOLR
60.0000 mg | Freq: Every day | INTRAMUSCULAR | Status: DC
  Filled 2017-01-30: qty 2

## 2017-01-30 MED ORDER — SODIUM CHLORIDE 0.9 % IV BOLUS (SEPSIS)
1000.0000 mL | Freq: Once | INTRAVENOUS | Status: AC
  Administered 2017-01-30: 1000 mL via INTRAVENOUS

## 2017-01-30 MED ORDER — NEOMYCIN SULFATE 500 MG PO TABS
1000.0000 mg | ORAL_TABLET | ORAL | Status: DC
  Filled 2017-01-30 (×2): qty 2

## 2017-01-30 MED ORDER — POTASSIUM CHLORIDE IN NACL 20-0.9 MEQ/L-% IV SOLN
INTRAVENOUS | Status: DC
  Administered 2017-01-30 – 2017-02-03 (×5): via INTRAVENOUS
  Filled 2017-01-30 (×8): qty 1000

## 2017-01-30 MED ORDER — CEFTRIAXONE SODIUM 1 G IJ SOLR
1.0000 g | Freq: Once | INTRAMUSCULAR | Status: AC
  Administered 2017-01-30: 1 g via INTRAVENOUS
  Filled 2017-01-30: qty 10

## 2017-01-30 MED ORDER — BUPIVACAINE LIPOSOME 1.3 % IJ SUSP
20.0000 mL | INTRAMUSCULAR | Status: AC
  Filled 2017-01-30 (×2): qty 20

## 2017-01-30 MED ORDER — DEXTROSE 5 % IV SOLN: 2.0000 g | INTRAVENOUS | Status: DC

## 2017-01-30 MED ORDER — IOPAMIDOL (ISOVUE-300) INJECTION 61%
100.0000 mL | Freq: Once | INTRAVENOUS | Status: AC | PRN
  Administered 2017-01-30: 100 mL via INTRAVENOUS

## 2017-01-30 MED ORDER — SODIUM CHLORIDE 0.9 % IV SOLN
INTRAVENOUS | Status: AC
  Filled 2017-01-30: qty 6

## 2017-01-30 MED ORDER — ALVIMOPAN 12 MG PO CAPS
12.0000 mg | ORAL_CAPSULE | ORAL | Status: AC
  Administered 2017-01-31: 12 mg via ORAL
  Filled 2017-01-30: qty 1

## 2017-01-30 MED ORDER — METHYLPREDNISOLONE SODIUM SUCC 125 MG IJ SOLR
60.0000 mg | Freq: Four times a day (QID) | INTRAMUSCULAR | Status: DC
  Administered 2017-01-30: 60 mg via INTRAVENOUS
  Filled 2017-01-30: qty 2

## 2017-01-30 MED ORDER — DEXTROSE 5 % IV SOLN
1.0000 g | INTRAVENOUS | Status: DC
  Administered 2017-01-30: 1 g via INTRAVENOUS
  Filled 2017-01-30 (×2): qty 10

## 2017-01-30 MED ORDER — GABAPENTIN 300 MG PO CAPS
300.0000 mg | ORAL_CAPSULE | ORAL | Status: AC
  Administered 2017-01-31: 300 mg via ORAL
  Filled 2017-01-30: qty 1

## 2017-01-30 NOTE — H&P (Signed)
History and Physical    Marissa Blankenship ZWC:[id number] DOB: 06-05-1966 DOA: 01/29/2017  PCP: Marissa Perna, NP   Patient coming from: Home.  I have personally briefly reviewed patient's old medical records in [place]  Chief Complaint: Abdominal pain, nausea and vomiting 3 days.  HPI: Marissa Blankenship is a 51 y.o. female with medical history significant of GERD, allergies, anemia, anxiety, depression, Crohn's disease, IBS, hypertension who is coming to the emergency department with complaints of 2 weeks of on and off abdominal pain and nausea, followed by worsening abdominal pain, mild distention, decreased appetite, nausea and 2 episodes emesis for the past 3 days. She describes the pain as sharp and crampy. She denies fever, chills, dyspnea, chest pain, palpitations, dizziness, diarrhea, melena or hematochezia. She mentions that she has been mildly constipated lately, but had a bowel movement earlier in the day. She denies dysuria, frequency or hematuria. However, she mentions that her urine has been cloudy for the past 3 days.  ED Course: Initial vital signs temperature 98.3, pulse 60, blood pressure 109/71 mmHg, respirations 14 and O2 sat 99% on room air.  Urinalysis shows mild ketonuria, small hemoglobinuria, positive nitrites, small leukocyte esterase, few bacteria and 6-30 WBC. WBC 7.8, hemoglobin 11.2 g/dL and platelets 284. Her chemistry shows a calcium of 8.7, albumin of 3.4, ALT of 13 and lipase of 10. Otherwise is unremarkable.   CT scan abdomen/pelvis with oral and IV contrast shows inflammatory changes of the distal small bowel and SBO among other findings. Please see images and full radiology report for further detail.  She received Rocephin 1 g IVPB, Zofran 4 mg IV, morphine 4 mg IV and methylprednisolone 125 mg IVP 1.  Review of Systems: As per HPI otherwise 10 point review of systems negative.    Past Medical History:  Diagnosis Date  . Acid reflux   . Allergy   .  Anemia   . Anxiety   . Crohn's disease ([place])   . Depression   . Hypertension   . IBS (irritable bowel syndrome)    spastic colon  . Shortness of breath     Past Surgical History:  Procedure Laterality Date  . CESAREAN SECTION    . COLONOSCOPY    . COLONOSCOPY WITH PROPOFOL N/A 12/02/2015   Procedure: COLONOSCOPY WITH PROPOFOL;  Surgeon: Marissa Stabler, MD;  Location: [facility];  Service: Gastroenterology;  Laterality: N/A;  . ESOPHAGOGASTRODUODENOSCOPY    . UPPER GASTROINTESTINAL ENDOSCOPY    . WISDOM TOOTH EXTRACTION       reports that she quit smoking about 19 years ago. Her smoking use included Cigarettes. She has never used smokeless tobacco. She reports that she does not drink alcohol or use drugs.  No Known Allergies  Family History  Problem Relation Age of Onset  . Hypertension Father 38  . Hypertension Mother 53  . Crohn's disease Sister   . Diabetes Paternal Grandmother   . Hypertension Brother   . Colon cancer Neg Hx   . Pancreatic cancer Neg Hx   . Stomach cancer Neg Hx   . Esophageal cancer Neg Hx   . Rectal cancer Neg Hx     Prior to Admission medications   Medication Sig Start Date End Date Taking? Authorizing Provider  acetaminophen-codeine (TYLENOL #3) 300-30 MG tablet Take 1-2 tablets by mouth every 6 (six) hours as needed for moderate pain. 01/03/17  Yes Danis, Kirke Corin, MD  amLODipine (NORVASC) 10 MG tablet Take 10 mg  by mouth daily after breakfast.  07/26/16  Yes [provider]  dicyclomine (BENTYL) 20 MG tablet Take 1 tablet (20 mg total) by mouth 3 (three) times daily as needed (for abdominal pain.). Patient taking differently: Take 20 mg by mouth 3 (three) times daily.  12/06/16  Yes Danis, Estill Cotta III, MD  ferrous sulfate 325 (65 FE) MG tablet TAKE 1 TABLET BY ORAL ROUTE DAILY FOR 90 DAYS 09/30/16  Yes [provider]  metoprolol (LOPRESSOR) 50 MG tablet Take 50 mg by mouth daily after breakfast.  07/27/16  Yes [provider]  ondansetron (ZOFRAN) 4 MG tablet Take 1 tablet (4 mg total) by mouth every 8 (eight) hours as needed for nausea or vomiting. 09/15/16  Yes Danis, Kirke Corin, MD  pantoprazole (PROTONIX) 40 MG tablet Take 40 mg by mouth daily. 10/27/15  Yes [provider]  simethicone (MYLICON) 80 MG chewable tablet Chew 80 mg by mouth every 6 (six) hours as needed for flatulence.   Yes [provider]  calcium-vitamin D (OSCAL WITH D) 500-200 MG-UNIT tablet Take 1 tablet by mouth 2 (two) times daily. Patient not taking: Reported on 01/29/2017 05/31/16   Marissa Stabler, MD  lisinopril (PRINIVIL,ZESTRIL) 40 MG tablet Take 1 tablet (40 mg total) by mouth every morning. Patient not taking: Reported on 01/29/2017 12/05/15   Marissa Filler, MD  predniSONE (DELTASONE) 5 MG tablet Take 40 mg (8 tabs daily) by mouth for 5 days, then 30 mg (6 tabs daily) for 5 days, then 20 mg (4 tabs daily) for 5 days, then 10 mg (2 tabs daily) for 5 days, then 5 mg (1 tab daily) for 5 days. Patient not taking: Reported on 01/29/2017 12/30/16   Marissa Stabler, MD    Physical Exam: Vitals:   01/29/17 2003 01/29/17 2235 01/30/17 0130 01/30/17 0200  BP: 109/71 134/80 134/87 (!) 141/89  Pulse: 60 (!) 47 (!) 46 (!) 43  Resp: 14 16    Temp: 98.3 F (36.8 C)     TempSrc: Oral     SpO2: 99% 100% 99% 98%    Constitutional: NAD, calm, comfortable Eyes: PERRL, lids and conjunctivae normal ENMT: Mucous membranes and lips are dry. Posterior pharynx clear of any exudate or lesions.  Neck: normal, supple, no masses, no thyromegaly Respiratory: Clear to auscultation bilaterally, no wheezing, no crackles. Normal respiratory effort. No accessory muscle use.  Cardiovascular: Bradycardic at 48 BPM, no murmurs / rubs / gallops. No extremity edema. 2+ pedal pulses. No carotid bruits.  Abdomen: Soft, positive suprapubic and RLQ tenderness, no guarding/rebound/masses palpated. No hepatosplenomegaly. Bowel sounds  positive.  Musculoskeletal: no clubbing / cyanosis. Good ROM, no contractures. Normal muscle tone.  Skin: no rashes, lesions, ulcers on limited skin exam. Neurologic: CN 2-12 grossly intact. Sensation intact, DTR normal. Strength 5/5 in all 4.  Psychiatric: Normal judgment and insight. Alert and oriented x 4. Normal mood.    Labs on Admission: I have personally reviewed following labs and imaging studies  CBC:  Recent Labs Lab 01/29/17 2004  WBC 7.8  HGB 11.2*  HCT 34.9*  MCV 78.6  PLT 267   Basic Metabolic Panel:  Recent Labs Lab 01/29/17 2004  NA 136  K 3.6  CL 102  CO2 25  GLUCOSE 98  BUN 8  CREATININE 0.64  CALCIUM 8.7*   GFR: CrCl cannot be calculated (Unknown ideal weight.). Liver Function Tests:  Recent Labs Lab 01/29/17 2004  AST 15  ALT 13*  ALKPHOS 62  BILITOT 0.3  PROT 6.9  ALBUMIN 3.4*    Recent Labs Lab 01/29/17 2004  LIPASE 10*   No results for input(s): AMMONIA in the last 168 hours. Coagulation Profile: No results for input(s): INR, PROTIME in the last 168 hours. Cardiac Enzymes: No results for input(s): CKTOTAL, CKMB, CKMBINDEX, TROPONINI in the last 168 hours. BNP (last 3 results) No results for input(s): PROBNP in the last 8760 hours. HbA1C: No results for input(s): HGBA1C in the last 72 hours. CBG: No results for input(s): GLUCAP in the last 168 hours. Lipid Profile: No results for input(s): CHOL, HDL, LDLCALC, TRIG, CHOLHDL, LDLDIRECT in the last 72 hours. Thyroid Function Tests: No results for input(s): TSH, T4TOTAL, FREET4, T3FREE, THYROIDAB in the last 72 hours. Anemia Panel: No results for input(s): VITAMINB12, FOLATE, FERRITIN, TIBC, IRON, RETICCTPCT in the last 72 hours. Urine analysis:    Component Value Date/Time   COLORURINE YELLOW 01/29/2017 2003   APPEARANCEUR CLEAR 01/29/2017 2003   LABSPEC 1.011 01/29/2017 2003   PHURINE 7.0 01/29/2017 2003   GLUCOSEU NEGATIVE 01/29/2017 2003   HGBUR SMALL (A) 01/29/2017  2003   HGBUR negative 05/25/2010 1611   BILIRUBINUR NEGATIVE 01/29/2017 2003   KETONESUR 20 (A) 01/29/2017 2003   PROTEINUR NEGATIVE 01/29/2017 2003   UROBILINOGEN 1.0 10/19/2016 1308   NITRITE POSITIVE (A) 01/29/2017 2003   LEUKOCYTESUR SMALL (A) 01/29/2017 2003    Radiological Exams on Admission: Ct Abdomen Pelvis W Contrast  Result Date: 01/30/2017 CLINICAL DATA:  51 year old female with history of Crohn's presenting with generalized abdominal pain. EXAM: CT ABDOMEN AND PELVIS WITH CONTRAST TECHNIQUE: Multidetector CT imaging of the abdomen and pelvis was performed using the standard protocol following bolus administration of intravenous contrast. CONTRAST:  100 cc Isovue-300 COMPARISON:  CT dated 11/28/2016 FINDINGS: Lower chest: The visualized lung bases are clear. No intra-abdominal free air. There is moderate amount of free fluid within the posterior pelvis, increased compared to the prior CT. Hepatobiliary: Probable mild fatty infiltration of the liver. No intrahepatic biliary ductal dilatation. The gallbladder is unremarkable. Pancreas: Unremarkable. No pancreatic ductal dilatation or surrounding inflammatory changes. Spleen: Normal in size without focal abnormality. Adrenals/Urinary Tract: Mild bilateral pelviectasis, right greater left and similar or minimally increased compared to the prior CT. There is symmetric enhancement and excretion of contrast by both kidneys. The urinary bladder is distended. There is diffuse thickened bladder wall likely related to inflammatory changes of the bowel within the pelvis. There is a focal area of loss of fat plane superior to the bladder with adhesion of the distal small bowel. There is a a focal area of mucosal retraction and beaking of the dome of the bladder (coronal series 4, image 45) which appears somewhat contiguous with a low attenuating channel which may represent a scarred or narrowed fistulous tract. Stomach/Bowel: Oral contrast opacifies the  stomach and multiple loops of small bowel. There is no evidence of gastric outlet obstruction. There is inflammatory changes of the bowel within the pelvis with an area of tethering and adhesions of loops of small bowel (series 2, image 60) there is high grade narrowing of the terminal ileum in the right hemipelvis superior to the bladder with associated small-bowel obstruction. The distal small bowel loops measure up to approximately 4 cm in diameter. There is increase in the degree of obstruction and dilatation of the distal small bowel compared to the prior CT. Oral contrast terminates in the normal caliber distal small bowel within the  pelvis and does not reach the terminal ileum or segments of dilated distal ileum. This may be related to timing of the contrast or obstruction related to adhesions. There is an approximately 10 cm strictured segment of the terminal ileum (series 4, image 44, and 56). There are multiple areas of strictures in the distal ileum. There is mucosal enhancement of the dilated loops of distal small bowel suggestive of recurrent or flare up of Crohn's disease. No drainable fluid collection or abscess identified. There is moderate stool throughout the colon. No inflammatory changes of the colon. Vascular/Lymphatic: There is moderate aortoiliac atherosclerotic disease. The origins of the celiac axis, SMA, IMA appear patent. The SMV, splenic vein, and main portal vein are patent. No portal venous gas identified. There is no adenopathy. Reproductive: Multiple mildly enlarged nabothian cysts noted. There is small amount of fluid within the endometrial canal. There is small amount of air within the vagina which may be introduced through the vaginal canal. However, an enterovaginal fistula is not entirely excluded. The ovaries are grossly unremarkable as visualized. Other: Mild diffuse subcutaneous stranding. Musculoskeletal: No acute or significant osseous findings. IMPRESSION: 1. Inflammatory  changes of the distal small bowel with areas of adhesions and strictures and resulting obstruction. Overall there has been interval increase in the degree of obstruction compared to the prior study. There is mucosal enhancement of the dilated loops of small bowel suggestive of active disease or flare up of Crohn's. There is a approximately 10 cm stricture of the terminal ileum. No drainable fluid collection or abscess. 2. Adhesions of the distal small bowel to the bladder dome. A focal area retraction and beaking in the bladder dome appears to be contiguous with a narrow tract and may represent a scarred fistula. 3. Air within the vagina may be introduced from the vaginal canal. However, and enterovaginal fistula is not entirely excluded. Electronically Signed   By: Anner Crete M.D.   On: 01/30/2017 01:46    EKG: Independently reviewed.   Assessment/Plan Principal Problem:   Exacerbation of Crohn's disease with intestinal obstruction (HCC) Observation/telemetry. Keep nothing by mouth. The patient's abdominal distention has decreased significantly after having BM. I explained to the patient that NGT may be necessary later if distention recurs. Continue analgesics and antiemetics as needed. Continue IV Solu-Medrol.  The patient is scheduled to see Dr. Leighton Ruff early next month for surgical treatment,  since medical management with GI has not been successful.  Active Problems:   Essential hypertension Hold amlodipine, atenolol and lisinopril. Monitor blood pressure, renal function and electrolytes.    ACID REFLUX DISEASE Pantoprazole 40 mg IVP every 24 hours.    Lower urinary tract infectious disease Continue ceftriaxone 1 g IV PB every 24 hours. Follow-up urine culture and sensitivity.    Anemia Monitor hematocrit and hemoglobin.    Bradycardia Hold metoprolol and monitor her.     DVT prophylaxis: SCDs. Code Status: Full code. Family Communication:  Disposition Plan:  Observation for IVF, IV solumedrol, IV Rocephin and symptoms management. Consults called:  Admission status: Observation/telemetry.   Reubin Milan MD [facility] Pager [phone]  If 7PM-7AM, please contact night-coverage [email or web address] Password [email or web address]  01/30/2017, 3:06 AM

## 2017-01-30 NOTE — Consult Note (Signed)
Consultation  Referring Provider:  Dr. Clementeen Graham    Primary Care Physician:  Kerin Perna, NP Primary Gastroenterologist:  Dr. Loletha Carrow     Reason for Consultation: Exacerbation of Crohn's, SBO             HPI:   Marissa Blankenship is a 51 y.o. female with a past medical history of reflux, anxiety, Crohn's disease, depression and IBS, who presented to the ED on 01/29/17 with complaint of 2 weeks of off and on abdominal pain and nausea followed by worsening abdominal pain, mild distention, decrease in appetite and nausea with 2 episodes of emesis within the past 3 days.   Today, the patient explains that for the past 2 weeks she has had RLQ/ generalzed abdominal pain "off and on" accompanied by constipation and nausea. Over the past 3 days she then started with emesis, noting 2 separate episodes. "I knew I couldn't handle this anymore at home". She tells me her last dose of Entyvio was June 5/6th and that she was just "trying to make it till the time of my surgical consult".  She has continued on Prednisone as prescribed, now at 25 mg qd (tapered from 40).  Patient tells me she was able to pass some liquid stool yesterday in the ER and has been passing gas. With IV meds here she is no longer experiencing abdominal pain or nausea.  She tells me she is hungry and would appreciate a "clear diet" if able.   Patient denies fever, chills, blood in her stool, dizziness or syncope.    ED Course: Initial vital signs temperature 98.3, pulse 60, blood pressure 109/71 mmHg, respirations 14 and O2 sat 99% on room air.  Urinalysis shows mild ketonuria, small hemoglobinuria, positive nitrites, small leukocyte esterase, few bacteria and 6-30 WBC. WBC 7.8, hemoglobin 11.2 g/dL and platelets 284. Her chemistry shows a calcium of 8.7, albumin of 3.4, ALT of 13 and lipase of 10. Otherwise is unremarkable.   CT scan abdomen/pelvis with oral and IV contrast shows inflammatory changes in the distal small bowel with areas of  adhesions and strictures and resulting obstruction. Overall there has been interval increase in the degree of obstruction compared to prior study. There was mucosal enhancement of the dilated loops of small bowel suggestive of active disease or flare up of Crohn's. Proximal 10 cm stricture of the terminal ileum. No drainable fluid collection or abscess. Adhesions of the distal small bowel to the bladder dome. A focal area retraction and beaking in the bladder dome appears to be contiguous with a narrow tract and may represent a scarred fistula. Air within the vagina may be introduced from the vaginal canal however, an enterovaginal fistula could not be excluded.  Most recent GI history: (More in chart) 05/25/16-colonoscopy, Dr. Loletha Carrow: Impression: Crohn's disease with ileitis. Inflammation found. This was severe. Findings are unchanged compared to previous examinations. Plan: Resume azathioprine 100 mg once daily, continue Humira 40 mg every other week. Plan for change from Humira to Vedolizumab as patient appeared to be a primary anti-TNF nonresponder. There was some fibrous stenosis at ICV, but there was also a long segment of terminal ileum involved with inflammatory changes as evidenced by recent CT enterography and symptom response to prednisone. Therefore, treatment with an alternative biologic agent was referral to surgery at that juncture. 11/22/16-office visit, Dr. Loletha Carrow: He was described that the patient had required multiple doses of prednisone to control her symptoms of abdominal pain and constipation and nausea.  She had improved on prednisone and was weaned off after a few weeks. In early April she had an episode of bacterial vaginosis for which she was seen in urgent care. The Flagyl they prescribed her upset her stomach and she took a few days of prednisone which she had left over. She continues with right lower quadrant abdominal pain that time which was "somewhat less than before". She still  radiated towards constipation which was thought likely due to severe small bowel inflammation and partial obstruction. She is continuing to take MiraLAX intermittently. She denied any rectal bleeding but had lost 6 pounds from her last office visit in March. Plan: Dr. Loletha Carrow was concerned regarding patient's small bowel Crohn's being very active and possibly needing surgery. She had several into view infusions, as recent on April 11 and was due for another one at the end of May. She was a primary nonresponder to anti-TNF therapy. Patient was told to stop her lisinopril at that time as her blood pressure was low on 3 separate antihypertensive medicines. Labs are ordered that today including a CBC, albumin, prealbumin, ESR, B12 and iron studies. A CT enterography was ordered and depending on results of his that she may need a reconsultation with colorectal surgeon to consider resection. 11/28/16-CT enterography: Intermittent regions of stricturing and mild small bowel dilatation involving the distal ileum consistent with Crohn's disease. Diffuse mucosal enhancement of the distal ileum suggesting mild active Crohn's disease. No fistula or abscess. Bowel inflammation had not significantly changed from CT of 04/06/16. No evidence for involvement of the colon, duodenum or stomach. That time patient was scheduled to see Dr. Leighton Ruff to discuss surgery. She was told to continue the into view in the meantime. 12/30/16-telephone call, Dr. Loletha Carrow: Patient started on a short course of prednisone to help was sounded like semi-obstructive symptoms. She was given 40 mg taper over 25 days. She was also given oxycodone.  Past Medical History:  Diagnosis Date  . Acid reflux   . Allergy   . Anemia   . Anxiety   . Crohn's disease ([place])   . Depression   . Hypertension   . IBS (irritable bowel syndrome)    spastic colon  . Shortness of breath     Past Surgical History:  Procedure Laterality Date  . CESAREAN SECTION      . COLONOSCOPY    . COLONOSCOPY WITH PROPOFOL N/A 12/02/2015   Procedure: COLONOSCOPY WITH PROPOFOL;  Surgeon: Doran Stabler, MD;  Location: [facility];  Service: Gastroenterology;  Laterality: N/A;  . ESOPHAGOGASTRODUODENOSCOPY    . UPPER GASTROINTESTINAL ENDOSCOPY    . WISDOM TOOTH EXTRACTION      Family History  Problem Relation Age of Onset  . Hypertension Father 12  . Hypertension Mother 15  . Crohn's disease Sister   . Diabetes Paternal Grandmother   . Hypertension Brother   . Colon cancer Neg Hx   . Pancreatic cancer Neg Hx   . Stomach cancer Neg Hx   . Esophageal cancer Neg Hx   . Rectal cancer Neg Hx      Social History  Substance Use Topics  . Smoking status: Former Smoker    Types: Cigarettes    Quit date: 07/18/1997  . Smokeless tobacco: Never Used  . Alcohol use No    Prior to Admission medications   Medication Sig Start Date End Date Taking? Authorizing Provider  acetaminophen-codeine (TYLENOL #3) 300-30 MG tablet Take 1-2 tablets by mouth every 6 (six)  hours as needed for moderate pain. 01/03/17  Yes Danis, Kirke Corin, MD  amLODipine (NORVASC) 10 MG tablet Take 10 mg by mouth daily after breakfast.  07/26/16  Yes [provider]  dicyclomine (BENTYL) 20 MG tablet Take 1 tablet (20 mg total) by mouth 3 (three) times daily as needed (for abdominal pain.). Patient taking differently: Take 20 mg by mouth 3 (three) times daily.  12/06/16  Yes Danis, Estill Cotta III, MD  ferrous sulfate 325 (65 FE) MG tablet TAKE 1 TABLET BY ORAL ROUTE DAILY FOR 90 DAYS 09/30/16  Yes [provider]  metoprolol (LOPRESSOR) 50 MG tablet Take 50 mg by mouth daily after breakfast.  07/27/16  Yes [provider]  ondansetron (ZOFRAN) 4 MG tablet Take 1 tablet (4 mg total) by mouth every 8 (eight) hours as needed for nausea or vomiting. 09/15/16  Yes Danis, Kirke Corin, MD  pantoprazole (PROTONIX) 40 MG tablet Take 40 mg by mouth daily. 10/27/15  Yes [provider]  simethicone (MYLICON) 80 MG chewable tablet Chew 80 mg by mouth every 6 (six) hours as needed for flatulence.   Yes [provider]  calcium-vitamin D (OSCAL WITH D) 500-200 MG-UNIT tablet Take 1 tablet by mouth 2 (two) times daily. Patient not taking: Reported on 01/29/2017 05/31/16   Doran Stabler, MD  lisinopril (PRINIVIL,ZESTRIL) 40 MG tablet Take 1 tablet (40 mg total) by mouth every morning. Patient not taking: Reported on 01/29/2017 12/05/15   Eugenie Filler, MD  predniSONE (DELTASONE) 5 MG tablet Take 40 mg (8 tabs daily) by mouth for 5 days, then 30 mg (6 tabs daily) for 5 days, then 20 mg (4 tabs daily) for 5 days, then 10 mg (2 tabs daily) for 5 days, then 5 mg (1 tab daily) for 5 days. Patient not taking: Reported on 01/29/2017 12/30/16   Doran Stabler, MD    Current Facility-Administered Medications  Medication Dose Route Frequency Provider Last Rate Last Dose  . 0.9 % NaCl with KCl 20 mEq/ L  infusion   Intravenous Continuous Reubin Milan, MD 100 mL/hr at 01/30/17 [phone]    . cefTRIAXone (ROCEPHIN) 1 g in dextrose 5 % 50 mL IVPB  1 g Intravenous Q24H Reubin Milan, MD      . iopamidol (ISOVUE-300) 61 % injection           . MEDLINE mouth rinse  15 mL Mouth Rinse BID Dhungel, Nishant, MD   15 mL at 01/30/17 0824  . methylPREDNISolone sodium succinate (SOLU-MEDROL) 125 mg/2 mL injection 60 mg  60 mg Intravenous Q6H Reubin Milan, MD   60 mg at 01/30/17 [phone]  . morphine 2 MG/ML injection 2 mg  2 mg Intravenous Q3H PRN Reubin Milan, MD      . ondansetron [facility]) injection 4 mg  4 mg Intravenous Q6H PRN Reubin Milan, MD      . pantoprazole (PROTONIX) injection 40 mg  40 mg Intravenous QHS Reubin Milan, MD   40 mg at 01/30/17 1779    Allergies as of 01/29/2017  . (No Known Allergies)   Review of Systems:    Constitutional: No weight loss, fever or chills Skin: No rash Cardiovascular: No chest  pain Respiratory: No SOB  Gastrointestinal: See HPI and otherwise negative Genitourinary: No dysuria  Neurological: No headache Musculoskeletal: No new muscle or joint pain Hematologic: No bleeding or bruising Psychiatric: No history of depression or anxiety  Physical Exam:  Vital signs in last 24 hours: Temp:  [97.9 F (36.6 C)-98.6 F (37 C)] 97.9 F (36.6 C) (07/16 0536) Pulse Rate:  [43-60] 50 (07/16 0536) Resp:  [14-16] 15 (07/16 0536) BP: (109-145)/(71-89) 116/73 (07/16 0536) SpO2:  [98 %-100 %] 100 % (07/16 0536) Weight:  [128 lb 12 oz (58.4 kg)] 128 lb 12 oz (58.4 kg) (07/16 0344) Last BM Date: 01/29/17 General:   Pleasant African American female appears to be in NAD, Well developed, Well nourished, alert and cooperative Head:  Normocephalic and atraumatic. Eyes:   PEERL, EOMI. No icterus. Conjunctiva pink. Ears:  Normal auditory acuity. Neck:  Supple Throat: Oral cavity and pharynx without inflammation, swelling or lesion. Teeth in good condition. Lungs: Respirations even and unlabored. Lungs clear to auscultation bilaterally.   No wheezes, crackles, or rhonchi.  Heart: Normal S1, S2. No MRG. Regular rate and rhythm. No peripheral edema, cyanosis or pallor.  Abdomen:  Soft, nondistended,mild RLQ abdominal ttp No rebound or guarding. Normal bowel sounds. No appreciable masses or hepatomegaly. Rectal:  Not performed.  Msk:  Symmetrical without gross deformities. Extremities:  Without edema, no deformity or joint abnormality.  Neurologic:  Alert and  oriented x4;  grossly normal neurologically.  Skin:   Dry and intact without significant lesions or rashes. Psychiatric:  Demonstrates good judgement and reason without abnormal affect or behaviors.   LAB RESULTS:  Recent Labs  01/29/17 2004 01/30/17 0506  WBC 7.8 7.9  HGB 11.2* 11.9*  HCT 34.9* 36.3  PLT 284 273   BMET  Recent Labs  01/29/17 2004 01/30/17 0506  NA 136 137  K 3.6 3.6  CL 102 102  CO2 25 24   GLUCOSE 98 85  BUN 8 6  CREATININE 0.64 0.57  CALCIUM 8.7* 8.8*   LFT  Recent Labs  01/29/17 2004  PROT 6.9  ALBUMIN 3.4*  AST 15  ALT 13*  ALKPHOS 62  BILITOT 0.3   STUDIES: Ct Abdomen Pelvis W Contrast  Result Date: 01/30/2017 CLINICAL DATA:  51 year old female with history of Crohn's presenting with generalized abdominal pain. EXAM: CT ABDOMEN AND PELVIS WITH CONTRAST TECHNIQUE: Multidetector CT imaging of the abdomen and pelvis was performed using the standard protocol following bolus administration of intravenous contrast. CONTRAST:  100 cc Isovue-300 COMPARISON:  CT dated 11/28/2016 FINDINGS: Lower chest: The visualized lung bases are clear. No intra-abdominal free air. There is moderate amount of free fluid within the posterior pelvis, increased compared to the prior CT. Hepatobiliary: Probable mild fatty infiltration of the liver. No intrahepatic biliary ductal dilatation. The gallbladder is unremarkable. Pancreas: Unremarkable. No pancreatic ductal dilatation or surrounding inflammatory changes. Spleen: Normal in size without focal abnormality. Adrenals/Urinary Tract: Mild bilateral pelviectasis, right greater left and similar or minimally increased compared to the prior CT. There is symmetric enhancement and excretion of contrast by both kidneys. The urinary bladder is distended. There is diffuse thickened bladder wall likely related to inflammatory changes of the bowel within the pelvis. There is a focal area of loss of fat plane superior to the bladder with adhesion of the distal small bowel. There is a a focal area of mucosal retraction and beaking of the dome of the bladder (coronal series 4, image 45) which appears somewhat contiguous with a low attenuating channel which may represent a scarred or narrowed fistulous tract. Stomach/Bowel: Oral contrast opacifies the stomach and multiple loops of small bowel. There is no evidence of gastric outlet obstruction. There is inflammatory  changes  of the bowel within the pelvis with an area of tethering and adhesions of loops of small bowel (series 2, image 60) there is high grade narrowing of the terminal ileum in the right hemipelvis superior to the bladder with associated small-bowel obstruction. The distal small bowel loops measure up to approximately 4 cm in diameter. There is increase in the degree of obstruction and dilatation of the distal small bowel compared to the prior CT. Oral contrast terminates in the normal caliber distal small bowel within the pelvis and does not reach the terminal ileum or segments of dilated distal ileum. This may be related to timing of the contrast or obstruction related to adhesions. There is an approximately 10 cm strictured segment of the terminal ileum (series 4, image 44, and 56). There are multiple areas of strictures in the distal ileum. There is mucosal enhancement of the dilated loops of distal small bowel suggestive of recurrent or flare up of Crohn's disease. No drainable fluid collection or abscess identified. There is moderate stool throughout the colon. No inflammatory changes of the colon. Vascular/Lymphatic: There is moderate aortoiliac atherosclerotic disease. The origins of the celiac axis, SMA, IMA appear patent. The SMV, splenic vein, and main portal vein are patent. No portal venous gas identified. There is no adenopathy. Reproductive: Multiple mildly enlarged nabothian cysts noted. There is small amount of fluid within the endometrial canal. There is small amount of air within the vagina which may be introduced through the vaginal canal. However, an enterovaginal fistula is not entirely excluded. The ovaries are grossly unremarkable as visualized. Other: Mild diffuse subcutaneous stranding. Musculoskeletal: No acute or significant osseous findings. IMPRESSION: 1. Inflammatory changes of the distal small bowel with areas of adhesions and strictures and resulting obstruction. Overall there has  been interval increase in the degree of obstruction compared to the prior study. There is mucosal enhancement of the dilated loops of small bowel suggestive of active disease or flare up of Crohn's. There is a approximately 10 cm stricture of the terminal ileum. No drainable fluid collection or abscess. 2. Adhesions of the distal small bowel to the bladder dome. A focal area retraction and beaking in the bladder dome appears to be contiguous with a narrow tract and may represent a scarred fistula. 3. Air within the vagina may be introduced from the vaginal canal. However, and enterovaginal fistula is not entirely excluded. Electronically Signed   By: Anner Crete M.D.   On: 01/30/2017 01:46     PREVIOUS ENDOSCOPIES:            See HPI   Impression / Plan:   Impression: 1. Small bowel Crohn's exacerbation with QMV:HQIONGE is a primary nonresponder to anti-TNF therapy, patient has been on Entyvio since around December 2017, has continued to require prednisone, now with small bowel obstruction, patient was previously scheduled for surgical consultation as an outpatient in early August, likely she will need surgical intervention at this time\ 2. Abnormal CT with possible enterovaginal fistula?  Plan:  1. Agree with surgical consult, this had already been arranged for early August as an outpatient. Now with acute exacerbation, surgical intervention will need to be discussed. 2. Continue IV Solumedrol for now 64m qd (this was changed from q6h) 3. Continue other supportive measures including antiemetics and pain medicine 4. For now, remain NPO as bowel rest will help with symptoms 5. Please await any further recommendations from Dr. AHavery Moros Thank you for your kind consultation, we will continue to follow.  JAnderson Malta  Tasia Catchings  01/30/2017, 8:49 AM Pager #: [phone]

## 2017-01-30 NOTE — ED Provider Notes (Signed)
Patient signed out to me to follow-up on CT scan. Patient has a history of significant Crohn's disease. She is scheduled for surgery next month. She presents to the ER with 2 week history of progressively worsening pain. She has had nausea and vomiting at home. CT scan shows evidence of large area of active Crohn's disease as well as adhesions causing bowel obstruction. Recheck on the patient reveals that she is feeling much improvement after treatment. Pain is improved and she has not had any nausea or vomiting here in the ER. Will admit patient for treatment of active Crohn's disease as well as small bowel obstruction.  Results for orders placed or performed during the hospital encounter of 01/29/17  Lipase, blood  Result Value Ref Range   Lipase 10 (L) 11 - 51 U/L  Comprehensive metabolic panel  Result Value Ref Range   Sodium 136 135 - 145 mmol/L   Potassium 3.6 3.5 - 5.1 mmol/L   Chloride 102 101 - 111 mmol/L   CO2 25 22 - 32 mmol/L   Glucose, Bld 98 65 - 99 mg/dL   BUN 8 6 - 20 mg/dL   Creatinine, Ser 0.64 0.44 - 1.00 mg/dL   Calcium 8.7 (L) 8.9 - 10.3 mg/dL   Total Protein 6.9 6.5 - 8.1 g/dL   Albumin 3.4 (L) 3.5 - 5.0 g/dL   AST 15 15 - 41 U/L   ALT 13 (L) 14 - 54 U/L   Alkaline Phosphatase 62 38 - 126 U/L   Total Bilirubin 0.3 0.3 - 1.2 mg/dL   GFR calc non Af Amer >60 >60 mL/min   GFR calc Af Amer >60 >60 mL/min   Anion gap 9 5 - 15  CBC  Result Value Ref Range   WBC 7.8 4.0 - 10.5 K/uL   RBC 4.44 3.87 - 5.11 MIL/uL   Hemoglobin 11.2 (L) 12.0 - 15.0 g/dL   HCT 34.9 (L) 36.0 - 46.0 %   MCV 78.6 78.0 - 100.0 fL   MCH 25.2 (L) 26.0 - 34.0 pg   MCHC 32.1 30.0 - 36.0 g/dL   RDW 18.2 (H) 11.5 - 15.5 %   Platelets 284 150 - 400 K/uL  Urinalysis, Routine w reflex microscopic  Result Value Ref Range   Color, Urine YELLOW YELLOW   APPearance CLEAR CLEAR   Specific Gravity, Urine 1.011 1.005 - 1.030   pH 7.0 5.0 - 8.0   Glucose, UA NEGATIVE NEGATIVE mg/dL   Hgb urine  dipstick SMALL (A) NEGATIVE   Bilirubin Urine NEGATIVE NEGATIVE   Ketones, ur 20 (A) NEGATIVE mg/dL   Protein, ur NEGATIVE NEGATIVE mg/dL   Nitrite POSITIVE (A) NEGATIVE   Leukocytes, UA SMALL (A) NEGATIVE   RBC / HPF 0-5 0 - 5 RBC/hpf   WBC, UA 6-30 0 - 5 WBC/hpf   Bacteria, UA FEW (A) NONE SEEN   Squamous Epithelial / LPF 0-5 (A) NONE SEEN   Mucous PRESENT   I-Stat beta hCG blood, ED  Result Value Ref Range   I-stat hCG, quantitative <5.0 <5 mIU/mL   Comment 3           Ct Abdomen Pelvis W Contrast  Result Date: 01/30/2017 CLINICAL DATA:  51 year old female with history of Crohn's presenting with generalized abdominal pain. EXAM: CT ABDOMEN AND PELVIS WITH CONTRAST TECHNIQUE: Multidetector CT imaging of the abdomen and pelvis was performed using the standard protocol following bolus administration of intravenous contrast. CONTRAST:  100 cc Isovue-300 COMPARISON:  CT dated  11/28/2016 FINDINGS: Lower chest: The visualized lung bases are clear. No intra-abdominal free air. There is moderate amount of free fluid within the posterior pelvis, increased compared to the prior CT. Hepatobiliary: Probable mild fatty infiltration of the liver. No intrahepatic biliary ductal dilatation. The gallbladder is unremarkable. Pancreas: Unremarkable. No pancreatic ductal dilatation or surrounding inflammatory changes. Spleen: Normal in size without focal abnormality. Adrenals/Urinary Tract: Mild bilateral pelviectasis, right greater left and similar or minimally increased compared to the prior CT. There is symmetric enhancement and excretion of contrast by both kidneys. The urinary bladder is distended. There is diffuse thickened bladder wall likely related to inflammatory changes of the bowel within the pelvis. There is a focal area of loss of fat plane superior to the bladder with adhesion of the distal small bowel. There is a a focal area of mucosal retraction and beaking of the dome of the bladder (coronal  series 4, image 45) which appears somewhat contiguous with a low attenuating channel which may represent a scarred or narrowed fistulous tract. Stomach/Bowel: Oral contrast opacifies the stomach and multiple loops of small bowel. There is no evidence of gastric outlet obstruction. There is inflammatory changes of the bowel within the pelvis with an area of tethering and adhesions of loops of small bowel (series 2, image 60) there is high grade narrowing of the terminal ileum in the right hemipelvis superior to the bladder with associated small-bowel obstruction. The distal small bowel loops measure up to approximately 4 cm in diameter. There is increase in the degree of obstruction and dilatation of the distal small bowel compared to the prior CT. Oral contrast terminates in the normal caliber distal small bowel within the pelvis and does not reach the terminal ileum or segments of dilated distal ileum. This may be related to timing of the contrast or obstruction related to adhesions. There is an approximately 10 cm strictured segment of the terminal ileum (series 4, image 44, and 56). There are multiple areas of strictures in the distal ileum. There is mucosal enhancement of the dilated loops of distal small bowel suggestive of recurrent or flare up of Crohn's disease. No drainable fluid collection or abscess identified. There is moderate stool throughout the colon. No inflammatory changes of the colon. Vascular/Lymphatic: There is moderate aortoiliac atherosclerotic disease. The origins of the celiac axis, SMA, IMA appear patent. The SMV, splenic vein, and main portal vein are patent. No portal venous gas identified. There is no adenopathy. Reproductive: Multiple mildly enlarged nabothian cysts noted. There is small amount of fluid within the endometrial canal. There is small amount of air within the vagina which may be introduced through the vaginal canal. However, an enterovaginal fistula is not entirely  excluded. The ovaries are grossly unremarkable as visualized. Other: Mild diffuse subcutaneous stranding. Musculoskeletal: No acute or significant osseous findings. IMPRESSION: 1. Inflammatory changes of the distal small bowel with areas of adhesions and strictures and resulting obstruction. Overall there has been interval increase in the degree of obstruction compared to the prior study. There is mucosal enhancement of the dilated loops of small bowel suggestive of active disease or flare up of Crohn's. There is a approximately 10 cm stricture of the terminal ileum. No drainable fluid collection or abscess. 2. Adhesions of the distal small bowel to the bladder dome. A focal area retraction and beaking in the bladder dome appears to be contiguous with a narrow tract and may represent a scarred fistula. 3. Air within the vagina may be introduced from  the vaginal canal. However, and enterovaginal fistula is not entirely excluded. Electronically Signed   By: Anner Crete M.D.   On: 01/30/2017 01:46   Crohn's disease of small intestine with intestinal obstruction (HCC)  SBO (small bowel obstruction) (HCC)     Kaleisha Bhargava, Gwenyth Allegra, MD 01/30/17 [phone]

## 2017-01-30 NOTE — Consult Note (Signed)
[facility] Surgery Consult Note  Marissa Blankenship Jul 26, 1965  [id number].    Requesting MD: Flonnie Overman, MD Chief Complaint/Reason for Consult: distal pSBO 2/2 Crohn's ileitis and stricture of terminal ileum   HPI:  51 y/o female with a PMH crohn's disease on biologic therapy [facility]) who presented to [facility] long ED with 2 weeks of intermittent, sharp, non-radiating upper abdominal pain that has gradually worsened. Also reports intermittent lower abdominal pain. Associated symptoms include nausea, vomiting x3, constipation and decreased appetite. She reports that she is having bowel movements, but the past couple weeks has had to take pediatric suppositories to illicit a BM, which is atypical. After a BM she gets mild relief but her pain recurs. She has had these symptoms in the past. Patient was seen 12/16/16 by Dr. Marcello Moores for surgical consultation regarding terminal ileitis with recurrent symptoms of small bowel stricture with plans for possible SBR/cecectomy on 02/17/15, almost 8 weeks after patients Entyvio treatment on 12/20/16.  She is currently on a prednisone taper (40 mg taper over 25 days) prescribed by her GI physician yesterday when she called his office reporting obstructive sxs.   CT abdomen/ pelvis performed in ED 7/16: inflammatory changes in the distal small bowel with areas of adhesions and strictures and resulting obstruction, with interval increase in the degree of obstruction compared to prior study.  Labs: WBC WNL, lipase and LFTs are normal, possible UTI (Cx pending)   ROS: Review of Systems  Constitutional: Negative for chills and fever.  Cardiovascular: Negative for chest pain.  Gastrointestinal: Positive for constipation, nausea and vomiting. Negative for diarrhea.  Genitourinary: Negative for dysuria and hematuria.       Cloudy urine.  All other systems reviewed and are negative.   Family History  Problem Relation Age of Onset  . Hypertension Father 67  . Hypertension  Mother 77  . Crohn's disease Sister   . Diabetes Paternal Grandmother   . Hypertension Brother   . Colon cancer Neg Hx   . Pancreatic cancer Neg Hx   . Stomach cancer Neg Hx   . Esophageal cancer Neg Hx   . Rectal cancer Neg Hx     Past Medical History:  Diagnosis Date  . Acid reflux   . Allergy   . Anemia   . Anxiety   . Crohn's disease ([place])   . Depression   . Hypertension   . IBS (irritable bowel syndrome)    spastic colon  . Shortness of breath     Past Surgical History:  Procedure Laterality Date  . CESAREAN SECTION    . COLONOSCOPY    . COLONOSCOPY WITH PROPOFOL N/A 12/02/2015   Procedure: COLONOSCOPY WITH PROPOFOL;  Surgeon: Doran Stabler, MD;  Location: [facility];  Service: Gastroenterology;  Laterality: N/A;  . ESOPHAGOGASTRODUODENOSCOPY    . UPPER GASTROINTESTINAL ENDOSCOPY    . WISDOM TOOTH EXTRACTION      Social History:  reports that she quit smoking about 19 years ago. Her smoking use included Cigarettes. She has never used smokeless tobacco. She reports that she does not drink alcohol or use drugs.  Allergies: No Known Allergies  Medications Prior to Admission  Medication Sig Dispense Refill  . acetaminophen-codeine (TYLENOL #3) 300-30 MG tablet Take 1-2 tablets by mouth every 6 (six) hours as needed for moderate pain. 60 tablet 0  . amLODipine (NORVASC) 10 MG tablet Take 10 mg by mouth daily after breakfast.   3  . dicyclomine (BENTYL) 20 MG tablet Take 1  tablet (20 mg total) by mouth 3 (three) times daily as needed (for abdominal pain.). (Patient taking differently: Take 20 mg by mouth 3 (three) times daily. ) 60 tablet 3  . ferrous sulfate 325 (65 FE) MG tablet TAKE 1 TABLET BY ORAL ROUTE DAILY FOR 90 DAYS  3  . metoprolol (LOPRESSOR) 50 MG tablet Take 50 mg by mouth daily after breakfast.   0  . ondansetron (ZOFRAN) 4 MG tablet Take 1 tablet (4 mg total) by mouth every 8 (eight) hours as needed for nausea or vomiting. 30 tablet 1  .  pantoprazole (PROTONIX) 40 MG tablet Take 40 mg by mouth daily.  3  . simethicone (MYLICON) 80 MG chewable tablet Chew 80 mg by mouth every 6 (six) hours as needed for flatulence.    . calcium-vitamin D (OSCAL WITH D) 500-200 MG-UNIT tablet Take 1 tablet by mouth 2 (two) times daily. (Patient not taking: Reported on 01/29/2017) 60 tablet 6  . lisinopril (PRINIVIL,ZESTRIL) 40 MG tablet Take 1 tablet (40 mg total) by mouth every morning. (Patient not taking: Reported on 01/29/2017) 30 tablet 2  . predniSONE (DELTASONE) 5 MG tablet Take 40 mg (8 tabs daily) by mouth for 5 days, then 30 mg (6 tabs daily) for 5 days, then 20 mg (4 tabs daily) for 5 days, then 10 mg (2 tabs daily) for 5 days, then 5 mg (1 tab daily) for 5 days. (Patient not taking: Reported on 01/29/2017) 105 tablet 0    Blood pressure 116/73, pulse (!) 50, temperature 97.9 F (36.6 C), temperature source Oral, resp. rate 15, height _0  (1.626 m), weight 58.4 kg (128 lb 12 oz), SpO2 100 %. Physical Exam: Physical Exam  Constitutional: She is oriented to person, place, and time. She appears well-developed. No distress.  HENT:  Head: Normocephalic and atraumatic.  Right Ear: External ear normal.  Left Ear: External ear normal.  Nose: Nose normal.  Eyes: EOM are normal. No scleral icterus.  Neck: Neck supple. No tracheal deviation present.  Cardiovascular: Normal rate, regular rhythm, normal heart sounds and intact distal pulses.   Pulmonary/Chest: Effort normal and breath sounds normal. No stridor. No respiratory distress.  Abdominal: Soft. She exhibits no distension and no mass. There is no tenderness. There is no rebound and no guarding. No hernia.  Hypoactive BS  Musculoskeletal: Normal range of motion. She exhibits no edema or deformity.  Neurological: She is alert and oriented to person, place, and time. No sensory deficit.  Skin: Skin is warm and dry. She is not diaphoretic.  Psychiatric: She has a normal mood and affect. Her  behavior is normal.    Results for orders placed or performed during the hospital encounter of 01/29/17 (from the past 48 hour(s))  Urinalysis, Routine w reflex microscopic     Status: Abnormal   Collection Time: 01/29/17  8:03 PM  Result Value Ref Range   Color, Urine YELLOW YELLOW   APPearance CLEAR CLEAR   Specific Gravity, Urine 1.011 1.005 - 1.030   pH 7.0 5.0 - 8.0   Glucose, UA NEGATIVE NEGATIVE mg/dL   Hgb urine dipstick SMALL (A) NEGATIVE   Bilirubin Urine NEGATIVE NEGATIVE   Ketones, ur 20 (A) NEGATIVE mg/dL   Protein, ur NEGATIVE NEGATIVE mg/dL   Nitrite POSITIVE (A) NEGATIVE   Leukocytes, UA SMALL (A) NEGATIVE   RBC / HPF 0-5 0 - 5 RBC/hpf   WBC, UA 6-30 0 - 5 WBC/hpf   Bacteria, UA FEW (A) NONE SEEN  Squamous Epithelial / LPF 0-5 (A) NONE SEEN   Mucous PRESENT   Lipase, blood     Status: Abnormal   Collection Time: 01/29/17  8:04 PM  Result Value Ref Range   Lipase 10 (L) 11 - 51 U/L  Comprehensive metabolic panel     Status: Abnormal   Collection Time: 01/29/17  8:04 PM  Result Value Ref Range   Sodium 136 135 - 145 mmol/L   Potassium 3.6 3.5 - 5.1 mmol/L   Chloride 102 101 - 111 mmol/L   CO2 25 22 - 32 mmol/L   Glucose, Bld 98 65 - 99 mg/dL   BUN 8 6 - 20 mg/dL   Creatinine, Ser 0.64 0.44 - 1.00 mg/dL   Calcium 8.7 (L) 8.9 - 10.3 mg/dL   Total Protein 6.9 6.5 - 8.1 g/dL   Albumin 3.4 (L) 3.5 - 5.0 g/dL   AST 15 15 - 41 U/L   ALT 13 (L) 14 - 54 U/L   Alkaline Phosphatase 62 38 - 126 U/L   Total Bilirubin 0.3 0.3 - 1.2 mg/dL   GFR calc non Af Amer >60 >60 mL/min   GFR calc Af Amer >60 >60 mL/min    Comment: (NOTE) The eGFR has been calculated using the CKD EPI equation. This calculation has not been validated in all clinical situations. eGFR's persistently <60 mL/min signify possible Chronic Kidney Disease.    Anion gap 9 5 - 15  CBC     Status: Abnormal   Collection Time: 01/29/17  8:04 PM  Result Value Ref Range   WBC 7.8 4.0 - 10.5 K/uL   RBC  4.44 3.87 - 5.11 MIL/uL   Hemoglobin 11.2 (L) 12.0 - 15.0 g/dL   HCT 34.9 (L) 36.0 - 46.0 %   MCV 78.6 78.0 - 100.0 fL   MCH 25.2 (L) 26.0 - 34.0 pg   MCHC 32.1 30.0 - 36.0 g/dL   RDW 18.2 (H) 11.5 - 15.5 %   Platelets 284 150 - 400 K/uL  I-Stat beta hCG blood, ED     Status: None   Collection Time: 01/29/17  8:13 PM  Result Value Ref Range   I-stat hCG, quantitative <5.0 <5 mIU/mL   Comment 3            Comment:   GEST. AGE      CONC.  (mIU/mL)   <=1 WEEK        5 - 50     2 WEEKS       50 - 500     3 WEEKS       100 - 10,000     4 WEEKS     1,000 - 30,000        FEMALE AND NON-PREGNANT FEMALE:     LESS THAN 5 mIU/mL   Basic metabolic panel     Status: Abnormal   Collection Time: 01/30/17  5:06 AM  Result Value Ref Range   Sodium 137 135 - 145 mmol/L   Potassium 3.6 3.5 - 5.1 mmol/L   Chloride 102 101 - 111 mmol/L   CO2 24 22 - 32 mmol/L   Glucose, Bld 85 65 - 99 mg/dL   BUN 6 6 - 20 mg/dL   Creatinine, Ser 0.57 0.44 - 1.00 mg/dL   Calcium 8.8 (L) 8.9 - 10.3 mg/dL   GFR calc non Af Amer >60 >60 mL/min   GFR calc Af Amer >60 >60 mL/min    Comment: (NOTE) The  eGFR has been calculated using the CKD EPI equation. This calculation has not been validated in all clinical situations. eGFR's persistently <60 mL/min signify possible Chronic Kidney Disease.    Anion gap 11 5 - 15  CBC WITH DIFFERENTIAL     Status: Abnormal   Collection Time: 01/30/17  5:06 AM  Result Value Ref Range   WBC 7.9 4.0 - 10.5 K/uL   RBC 4.58 3.87 - 5.11 MIL/uL   Hemoglobin 11.9 (L) 12.0 - 15.0 g/dL   HCT 36.3 36.0 - 46.0 %   MCV 79.3 78.0 - 100.0 fL   MCH 26.0 26.0 - 34.0 pg   MCHC 32.8 30.0 - 36.0 g/dL   RDW 18.4 (H) 11.5 - 15.5 %   Platelets 273 150 - 400 K/uL   Neutrophils Relative % 84 %   Neutro Abs 6.7 1.7 - 7.7 K/uL   Lymphocytes Relative 11 %   Lymphs Abs 0.9 0.7 - 4.0 K/uL   Monocytes Relative 3 %   Monocytes Absolute 0.2 0.1 - 1.0 K/uL   Eosinophils Relative 2 %   Eosinophils  Absolute 0.1 0.0 - 0.7 K/uL   Basophils Relative 0 %   Basophils Absolute 0.0 0.0 - 0.1 K/uL  Magnesium     Status: None   Collection Time: 01/30/17  5:06 AM  Result Value Ref Range   Magnesium 2.2 1.7 - 2.4 mg/dL   Ct Abdomen Pelvis W Contrast  Result Date: 01/30/2017 CLINICAL DATA:  51 year old female with history of Crohn's presenting with generalized abdominal pain. EXAM: CT ABDOMEN AND PELVIS WITH CONTRAST TECHNIQUE: Multidetector CT imaging of the abdomen and pelvis was performed using the standard protocol following bolus administration of intravenous contrast. CONTRAST:  100 cc Isovue-300 COMPARISON:  CT dated 11/28/2016 FINDINGS: Lower chest: The visualized lung bases are clear. No intra-abdominal free air. There is moderate amount of free fluid within the posterior pelvis, increased compared to the prior CT. Hepatobiliary: Probable mild fatty infiltration of the liver. No intrahepatic biliary ductal dilatation. The gallbladder is unremarkable. Pancreas: Unremarkable. No pancreatic ductal dilatation or surrounding inflammatory changes. Spleen: Normal in size without focal abnormality. Adrenals/Urinary Tract: Mild bilateral pelviectasis, right greater left and similar or minimally increased compared to the prior CT. There is symmetric enhancement and excretion of contrast by both kidneys. The urinary bladder is distended. There is diffuse thickened bladder wall likely related to inflammatory changes of the bowel within the pelvis. There is a focal area of loss of fat plane superior to the bladder with adhesion of the distal small bowel. There is a a focal area of mucosal retraction and beaking of the dome of the bladder (coronal series 4, image 45) which appears somewhat contiguous with a low attenuating channel which may represent a scarred or narrowed fistulous tract. Stomach/Bowel: Oral contrast opacifies the stomach and multiple loops of small bowel. There is no evidence of gastric outlet  obstruction. There is inflammatory changes of the bowel within the pelvis with an area of tethering and adhesions of loops of small bowel (series 2, image 60) there is high grade narrowing of the terminal ileum in the right hemipelvis superior to the bladder with associated small-bowel obstruction. The distal small bowel loops measure up to approximately 4 cm in diameter. There is increase in the degree of obstruction and dilatation of the distal small bowel compared to the prior CT. Oral contrast terminates in the normal caliber distal small bowel within the pelvis and does not reach the terminal ileum  or segments of dilated distal ileum. This may be related to timing of the contrast or obstruction related to adhesions. There is an approximately 10 cm strictured segment of the terminal ileum (series 4, image 44, and 56). There are multiple areas of strictures in the distal ileum. There is mucosal enhancement of the dilated loops of distal small bowel suggestive of recurrent or flare up of Crohn's disease. No drainable fluid collection or abscess identified. There is moderate stool throughout the colon. No inflammatory changes of the colon. Vascular/Lymphatic: There is moderate aortoiliac atherosclerotic disease. The origins of the celiac axis, SMA, IMA appear patent. The SMV, splenic vein, and main portal vein are patent. No portal venous gas identified. There is no adenopathy. Reproductive: Multiple mildly enlarged nabothian cysts noted. There is small amount of fluid within the endometrial canal. There is small amount of air within the vagina which may be introduced through the vaginal canal. However, an enterovaginal fistula is not entirely excluded. The ovaries are grossly unremarkable as visualized. Other: Mild diffuse subcutaneous stranding. Musculoskeletal: No acute or significant osseous findings. IMPRESSION: 1. Inflammatory changes of the distal small bowel with areas of adhesions and strictures and  resulting obstruction. Overall there has been interval increase in the degree of obstruction compared to the prior study. There is mucosal enhancement of the dilated loops of small bowel suggestive of active disease or flare up of Crohn's. There is a approximately 10 cm stricture of the terminal ileum. No drainable fluid collection or abscess. 2. Adhesions of the distal small bowel to the bladder dome. A focal area retraction and beaking in the bladder dome appears to be contiguous with a narrow tract and may represent a scarred fistula. 3. Air within the vagina may be introduced from the vaginal canal. However, and enterovaginal fistula is not entirely excluded. Electronically Signed   By: Anner Crete M.D.   On: 01/30/2017 01:46   Assessment/Plan Distal SBO 2/2 crohn's ileitis and stricture of terminal ileum  - patient with recurrent crohn's flares requiring steroid treatment, despite being on biologic therapy for over 6 months. She is having more frequent obstructive symptoms with radiologic evidence of worsening distal SBO. I do think this patient would benefit from ileocecectomy this hospital admission. She is almost 6 weeks out from last [facility] treatment. Continue bowel rest. Will discuss timing of surgery with MD.    Jill Alexanders, [facility] 01/30/2017, 10:21 AM Pager: [phone] Consults: [phone] Mon-Fri 7:00 am-4:30 pm Sat-Sun 7:00 am-11:30 am

## 2017-01-30 NOTE — Consult Note (Signed)
[place] Nurse requested for preoperative stoma site marking  Discussed surgical procedure and stoma creation with patient and family.  Explained role of the [place] nurse team.  Provided the patient with educational booklet/DVD and provided samples of pouching options.  Answered patient and family questions.   Examined patient lying, sitting, and standing in order to place the marking in the patient's visual field, away from any creases or abdominal contour issues and within the rectus muscle.  Attempted to mark below the patient's belt line.    Marked for ileostomy in the RUQ  3 cm to the right of the umbilicus and   cm above the umbilicus.  Patient has deep horizontal creasing at umbilicus and small abdominal plane below umbilicus.  For best visualization, ostomy site is marked above.     Patient's abdomen cleansed with CHG wipes at site markings, allowed to air dry prior to marking.Covered mark with thin film transparent dressing to preserve mark until date of surgery.   [place] Nurse team will follow up with patient after surgery for continue ostomy care and teaching.  Domenic Moras RN BSN [place] Pager [phone]

## 2017-01-30 NOTE — Plan of Care (Signed)
Confirmed with Dr. Hassell Done that order for NPO at midnight should be placed and patient will not need stool clean out before procedure.

## 2017-01-30 NOTE — Progress Notes (Signed)
Patient Care Team: Kerin Perna, NP as PCP - General (Internal Medicine) Leighton Ruff, MD as Consulting Physician (General Surgery) Loletha Carrow Kirke Corin, MD as Consulting Physician (Gastroenterology)   Seen by Dr. Leighton Ruff June 1.   Discussion of elective ileocecectomy for Crohn's disease in august, waiting a while after the last immunosuppression dose Now worse & readmittted Please have gastroenterology see patient as well.  We will do a formal consult later in the day  Late note in office by Dr Marcello Moores June 1 CROHN'S DISEASE OF SMALL INTESTINE WITH OTHER COMPLICATION (E72.094) Impression: 51 year old female who presents to the office for follow-up for Crohn's disease. She continues to use her biologic, Entyvio. She continues to have symptoms of stricture. On CT enterography she has signs of terminal ileitis. I have recommended ileocececectomy. It appears that her left ovary is not involved but we discussed the possibility of removing this if it was too involved in the inflammation. We also discussed possible stricturoplasty if further strictures were noted in her small bowel. We discussed possible small bowel resection and cecectomy. We will plan on doing this approximately 6-8 weeks after her next Entyvio dose.  The surgery and anatomy were described to the patient as well as the risks of surgery and the possible complications. These include: Bleeding, deep abdominal infections and possible wound complications such as hernia and infection, damage to adjacent structures, leak of surgical connections, which can lead to other surgeries and possibly an ostomy, possible need for other procedures, such as abscess drains in radiology, possible prolonged hospital stay, possible diarrhea from removal of part of the colon, possible constipation from narcotics, possible bowel, bladder or sexual dysfunction if having rectal surgery, prolonged fatigue/weakness or appetite loss, possible early  recurrence of of disease, possible complications of their medical problems such as heart disease or arrhythmias or lung problems, death (less than 1%). I believe the patient understands and wishes to proceed with the surgery.  Current Plans Pt Education - [facility] Colon Bowel Prep 2015 Miralax/Antibiotics Started Neomycin Sulfate 500MG, 2 (two) Tablet SEE NOTE, #6, 12/16/2016, No Refill. Local Order: TAKE TWO TABLETS AT 2 PM, 3 PM, AND 10 PM THE DAY PRIOR TO SURGERY Started Flagyl 500MG, 2 (two) Tablet SEE NOTE, #6, 12/16/2016, No Refill. Local Order: Take at 2pm, 3pm, and 10pm the day prior to your colon operation

## 2017-01-30 NOTE — Progress Notes (Signed)
PROGRESS NOTE                                                                                                                                                                                                             Patient Demographics:    Marissa Blankenship, is a 51 y.o. female, DOB - 09/07/1965, SUP:[id number]  Admit date - 01/29/2017   Admitting Physician Marissa Milan, MD  Outpatient Primary MD for the patient is Marissa Perna, NP  LOS - 0  Outpatient Specialists: [place]  Chief Complaint  Patient presents with  . Abdominal Pain  . Emesis       Brief Narrative   51 year old female with anxiety, depression, hypertension and unresolved Crohn's disease who was referred to Marissa Blankenship for surgical resection (planned in early August after she completed her biological therapy) presented to the ED with 2 weeks of off-and-on abdominal pain associated with nausea, worsening abdominal pain with mild distention, poor by mouth intake and 2 episodes of vomiting. She reported being constipated. Her last dose of entyvio was 6 weeks back. Reports being compliant with her prednisone. Denies any fevers or chills, hematemesis or melena. In the ED vitals were stable. Had normal CBC and chemistry. CT of the abdomen and pelvis showed inflammatory changes in the distal small bowel with areas of adhesions or stricture causing obstruction. CT findings also concerning for enterovaginal fistula.    Subjective:   Patient reports her abdominal pain to be slightly better distention improved after having bowel movements in the ED. Has some nausea but no vomiting.   Assessment  & Plan :    Principal Problem:   Exacerbation of Crohn's disease with intestinal obstruction ([place]) Failed medical therapy with frequent small bowel obstruction. She did not respond to Humira and has been on biological for over 6 months without  significant outcome. Has not responded to multiple courses of steroids as well. Her gastroenterologist was also considering enrolling her into study at higher centers for alternating medications. GI consult appreciated and recommend surgical resection. Surgery consult appreciated . Possible ileocecectomy this hospitalization. Keep nothing by mouth. Does not need NG tube at this time. Serial abdominal exam. Pain control with when necessary morphine. On IV Solu-Medrol. Supportive care with antiemetics and IV fluids.   Active Problems:   Essential hypertension Stable.  Monitor on when necessary hydralazine.  GERD IV PPI daily    Lower urinary tract infectious disease On empiric Rocephin. Follow urine culture.  ? Enterovaginal fistula. Patient does report some vaginal discharge. Further recommendations for surgery.  Preop clearance Patient does not have any significant underlying comorbidities including coronary artery disease, diabetes mellitus, chronic kidney disease. She is able to perform all 4 METs. Check twelve-lead EKG. She does not need perioperative beta blocker. If EKG normal she does not perioperative cardiac workup.  Code Status : Full code  Family Communication  : None at bedside  Disposition Plan  : Pending hospital course  Barriers For Discharge : Active symptoms  Consults  :   [place] [facility]  Procedures  :  CT Abdomen  DVT Prophylaxis  :  Lovenox -   Lab Results  Component Value Date   PLT 273 01/30/2017    Antibiotics  :    Anti-infectives    Start     Dose/Rate Route Frequency Ordered Stop   01/30/17 2200  cefTRIAXone (ROCEPHIN) 1 g in dextrose 5 % 50 mL IVPB     1 g 100 mL/hr over 30 Minutes Intravenous Every 24 hours 01/30/17 0332     01/30/17 0015  cefTRIAXone (ROCEPHIN) 1 g in dextrose 5 % 50 mL IVPB     1 g 100 mL/hr over 30 Minutes Intravenous  Once 01/30/17 0008 01/30/17 0150        Objective:   Vitals:   01/30/17 0200  01/30/17 0340 01/30/17 0344 01/30/17 0536  BP: (!) 141/89 (!) 145/85  116/73  Pulse: (!) 43 (!) 50  (!) 50  Resp:  16  15  Temp:  98.6 F (37 C)  97.9 F (36.6 C)  TempSrc:  Oral  Oral  SpO2: 98% 100%  100%  Weight:   58.4 kg (128 lb 12 oz)   Height:   5' 4"  (1.626 m)     Wt Readings from Last 3 Encounters:  01/30/17 58.4 kg (128 lb 12 oz)  12/22/16 57.2 kg (126 lb)  11/22/16 57.8 kg (127 lb 8 oz)     Intake/Output Summary (Last 24 hours) at 01/30/17 1323 Last data filed at 01/30/17 1003  Gross per 24 hour  Intake             1000 ml  Output              800 ml  Net              200 ml     Physical Exam  Gen: not in distress HEENT: no pallor, Dry oral mucosa supple neck Chest: clear b/l, no added sounds CVS: N S1&S2, no murmurs, rubs or gallop GI: soft, nondistended, periumbilical tenderness, bowel sounds present Musculoskeletal: warm, no edema     Data Review:    CBC  Recent Labs Lab 01/29/17 2004 01/30/17 0506  WBC 7.8 7.9  HGB 11.2* 11.9*  HCT 34.9* 36.3  PLT 284 273  MCV 78.6 79.3  MCH 25.2* 26.0  MCHC 32.1 32.8  RDW 18.2* 18.4*  LYMPHSABS  --  0.9  MONOABS  --  0.2  EOSABS  --  0.1  BASOSABS  --  0.0    Chemistries   Recent Labs Lab 01/29/17 2004 01/30/17 0506  NA 136 137  K 3.6 3.6  CL 102 102  CO2 25 24  GLUCOSE 98 85  BUN 8 6  CREATININE 0.64 0.57  CALCIUM 8.7*  8.8*  MG  --  2.2  AST 15  --   ALT 13*  --   ALKPHOS 62  --   BILITOT 0.3  --    ------------------------------------------------------------------------------------------------------------------ No results for input(s): CHOL, HDL, LDLCALC, TRIG, CHOLHDL, LDLDIRECT in the last 72 hours.  Lab Results  Component Value Date   HGBA1C 5.7 (H) 12/01/2015   ------------------------------------------------------------------------------------------------------------------ No results for input(s): TSH, T4TOTAL, T3FREE, THYROIDAB in the last 72 hours.  Invalid input(s):  FREET3 ------------------------------------------------------------------------------------------------------------------ No results for input(s): VITAMINB12, FOLATE, FERRITIN, TIBC, IRON, RETICCTPCT in the last 72 hours.  Coagulation profile No results for input(s): INR, PROTIME in the last 168 hours.  No results for input(s): DDIMER in the last 72 hours.  Cardiac Enzymes No results for input(s): CKMB, TROPONINI, MYOGLOBIN in the last 168 hours.  Invalid input(s): CK ------------------------------------------------------------------------------------------------------------------ No results found for: BNP  Inpatient Medications  Scheduled Meds: . mouth rinse  15 mL Mouth Rinse BID  . [START ON 01/31/2017] methylPREDNISolone sodium succinate  60 mg Intravenous Daily  . pantoprazole (PROTONIX) IV  40 mg Intravenous QHS   Continuous Infusions: . 0.9 % NaCl with KCl 20 mEq / L 100 mL/hr at 01/30/17 0614  . cefTRIAXone (ROCEPHIN)  IV     PRN Meds:.morphine injection, ondansetron (ZOFRAN) IV  Micro Results No results found for this or any previous visit (from the past 240 hour(s)).  Radiology Reports Ct Abdomen Pelvis W Contrast  Result Date: 01/30/2017 CLINICAL DATA:  50 year old female with history of Crohn's presenting with generalized abdominal pain. EXAM: CT ABDOMEN AND PELVIS WITH CONTRAST TECHNIQUE: Multidetector CT imaging of the abdomen and pelvis was performed using the standard protocol following bolus administration of intravenous contrast. CONTRAST:  100 cc Isovue-300 COMPARISON:  CT dated 11/28/2016 FINDINGS: Lower chest: The visualized lung bases are clear. No intra-abdominal free air. There is moderate amount of free fluid within the posterior pelvis, increased compared to the prior CT. Hepatobiliary: Probable mild fatty infiltration of the liver. No intrahepatic biliary ductal dilatation. The gallbladder is unremarkable. Pancreas: Unremarkable. No pancreatic ductal  dilatation or surrounding inflammatory changes. Spleen: Normal in size without focal abnormality. Adrenals/Urinary Tract: Mild bilateral pelviectasis, right greater left and similar or minimally increased compared to the prior CT. There is symmetric enhancement and excretion of contrast by both kidneys. The urinary bladder is distended. There is diffuse thickened bladder wall likely related to inflammatory changes of the bowel within the pelvis. There is a focal area of loss of fat plane superior to the bladder with adhesion of the distal small bowel. There is a a focal area of mucosal retraction and beaking of the dome of the bladder (coronal series 4, image 45) which appears somewhat contiguous with a low attenuating channel which may represent a scarred or narrowed fistulous tract. Stomach/Bowel: Oral contrast opacifies the stomach and multiple loops of small bowel. There is no evidence of gastric outlet obstruction. There is inflammatory changes of the bowel within the pelvis with an area of tethering and adhesions of loops of small bowel (series 2, image 60) there is high grade narrowing of the terminal ileum in the right hemipelvis superior to the bladder with associated small-bowel obstruction. The distal small bowel loops measure up to approximately 4 cm in diameter. There is increase in the degree of obstruction and dilatation of the distal small bowel compared to the prior CT. Oral contrast terminates in the normal caliber distal small bowel within the pelvis and does not reach the terminal ileum  or segments of dilated distal ileum. This may be related to timing of the contrast or obstruction related to adhesions. There is an approximately 10 cm strictured segment of the terminal ileum (series 4, image 44, and 56). There are multiple areas of strictures in the distal ileum. There is mucosal enhancement of the dilated loops of distal small bowel suggestive of recurrent or flare up of Crohn's disease. No  drainable fluid collection or abscess identified. There is moderate stool throughout the colon. No inflammatory changes of the colon. Vascular/Lymphatic: There is moderate aortoiliac atherosclerotic disease. The origins of the celiac axis, SMA, IMA appear patent. The SMV, splenic vein, and main portal vein are patent. No portal venous gas identified. There is no adenopathy. Reproductive: Multiple mildly enlarged nabothian cysts noted. There is small amount of fluid within the endometrial canal. There is small amount of air within the vagina which may be introduced through the vaginal canal. However, an enterovaginal fistula is not entirely excluded. The ovaries are grossly unremarkable as visualized. Other: Mild diffuse subcutaneous stranding. Musculoskeletal: No acute or significant osseous findings. IMPRESSION: 1. Inflammatory changes of the distal small bowel with areas of adhesions and strictures and resulting obstruction. Overall there has been interval increase in the degree of obstruction compared to the prior study. There is mucosal enhancement of the dilated loops of small bowel suggestive of active disease or flare up of Crohn's. There is a approximately 10 cm stricture of the terminal ileum. No drainable fluid collection or abscess. 2. Adhesions of the distal small bowel to the bladder dome. A focal area retraction and beaking in the bladder dome appears to be contiguous with a narrow tract and may represent a scarred fistula. 3. Air within the vagina may be introduced from the vaginal canal. However, and enterovaginal fistula is not entirely excluded. Electronically Signed   By: Anner Crete M.D.   On: 01/30/2017 01:46    Time Spent in minutes  20   Louellen Molder M.D on 01/30/2017 at 1:23 PM  Between 7am to 7pm - Pager - [phone]  After 7pm go to [email or web address] - password [facility]  [facility] -  Office  [phone]

## 2017-01-31 ENCOUNTER — Inpatient Hospital Stay (HOSPITAL_COMMUNITY): Payer: Medicaid Other | Admitting: Anesthesiology

## 2017-01-31 ENCOUNTER — Encounter (HOSPITAL_COMMUNITY)
Admission: EM | Disposition: A | Discharge status: Home / Self Care | Payer: Self-pay | Source: Home / Self Care | Attending: Internal Medicine

## 2017-01-31 ENCOUNTER — Encounter (HOSPITAL_COMMUNITY): Payer: Self-pay | Admitting: Anesthesiology

## 2017-01-31 HISTORY — PX: LAPAROSCOPIC ILEOCECECTOMY: SHX5898

## 2017-01-31 LAB — BASIC METABOLIC PANEL
Anion gap: 8 (ref 5–15)
BUN: 9 mg/dL (ref 6–20)
CALCIUM: 8.6 mg/dL — AB (ref 8.9–10.3)
CO2: 22 mmol/L (ref 22–32)
CREATININE: 0.62 mg/dL (ref 0.44–1.00)
Chloride: 108 mmol/L (ref 101–111)
GFR calc Af Amer: >60 mL/min (ref >60)
GFR calc non Af Amer: >60 mL/min (ref >60)
GLUCOSE: 93 mg/dL (ref 65–99)
Potassium: 4 mmol/L (ref 3.5–5.1)
Sodium: 138 mmol/L (ref 135–145)

## 2017-01-31 LAB — SEDIMENTATION RATE: SED RATE: 26 mm/h — AB (ref 0–22)

## 2017-01-31 LAB — CBC WITH DIFFERENTIAL/PLATELET
BASOS PCT: 0 %
Basophils Absolute: 0 10*3/uL (ref 0.0–0.1)
EOS ABS: 0 10*3/uL (ref 0.0–0.7)
Eosinophils Relative: 0 %
HCT: 29.7 % — ABNORMAL LOW (ref 36.0–46.0)
Hemoglobin: 9.6 g/dL — ABNORMAL LOW (ref 12.0–15.0)
Lymphocytes Relative: 17 %
Lymphs Abs: 1.1 10*3/uL (ref 0.7–4.0)
MCH: 26.1 pg (ref 26.0–34.0)
MCHC: 32.3 g/dL (ref 30.0–36.0)
MCV: 80.7 fL (ref 78.0–100.0)
MONO ABS: 0.8 10*3/uL (ref 0.1–1.0)
MONOS PCT: 12 %
Neutro Abs: 4.5 10*3/uL (ref 1.7–7.7)
Neutrophils Relative %: 71 %
Platelets: 251 10*3/uL (ref 150–400)
RBC: 3.68 MIL/uL — ABNORMAL LOW (ref 3.87–5.11)
RDW: 18.5 % — AB (ref 11.5–15.5)
WBC: 6.4 10*3/uL (ref 4.0–10.5)

## 2017-01-31 LAB — SURGICAL PCR SCREEN
MRSA, PCR: NEGATIVE
STAPHYLOCOCCUS AUREUS: NEGATIVE

## 2017-01-31 LAB — HIV ANTIBODY (ROUTINE TESTING W REFLEX): HIV Screen 4th Generation wRfx: NONREACTIVE

## 2017-01-31 LAB — C-REACTIVE PROTEIN: CRP: 1 mg/dL — AB (ref <1.0)

## 2017-01-31 LAB — TYPE AND SCREEN
ABO/RH(D): B NEG
ANTIBODY SCREEN: NEGATIVE

## 2017-01-31 SURGERY — EXCISION, CECUM WITH ILEUM, LAPAROSCOPIC
Anesthesia: General

## 2017-01-31 MED ORDER — FERROUS SULFATE 325 (65 FE) MG PO TABS
325.0000 mg | ORAL_TABLET | Freq: Two times a day (BID) | ORAL | Status: DC
Start: 2017-02-01 — End: 2017-02-03
  Administered 2017-02-01 – 2017-02-03 (×5): 325 mg via ORAL
  Filled 2017-01-31 (×5): qty 1

## 2017-01-31 MED ORDER — ACETAMINOPHEN 500 MG PO TABS
1000.0000 mg | ORAL_TABLET | Freq: Three times a day (TID) | ORAL | Status: DC
  Administered 2017-01-31 – 2017-02-03 (×8): 1000 mg via ORAL
  Filled 2017-01-31 (×8): qty 2

## 2017-01-31 MED ORDER — DEXTROSE 5 % IV SOLN
2.0000 g | Freq: Two times a day (BID) | INTRAVENOUS | Status: AC
  Administered 2017-02-01: 2 g via INTRAVENOUS
  Filled 2017-01-31: qty 2

## 2017-01-31 MED ORDER — KETAMINE HCL 10 MG/ML IJ SOLN
INTRAMUSCULAR | Status: AC
  Filled 2017-01-31: qty 1

## 2017-01-31 MED ORDER — BUPIVACAINE-EPINEPHRINE (PF) 0.25% -1:200000 IJ SOLN
INTRAMUSCULAR | Status: AC
  Filled 2017-01-31: qty 30

## 2017-01-31 MED ORDER — ONDANSETRON HCL 4 MG/2ML IJ SOLN
INTRAMUSCULAR | Status: DC | PRN
  Administered 2017-01-31: 4 mg via INTRAVENOUS

## 2017-01-31 MED ORDER — DEXTROSE 5 % IV SOLN
1000.0000 mg | Freq: Four times a day (QID) | INTRAVENOUS | Status: DC | PRN
  Filled 2017-01-31: qty 10

## 2017-01-31 MED ORDER — FENTANYL CITRATE (PF) 250 MCG/5ML IJ SOLN
INTRAMUSCULAR | Status: AC
  Filled 2017-01-31: qty 5

## 2017-01-31 MED ORDER — ROCURONIUM BROMIDE 10 MG/ML (PF) SYRINGE
PREFILLED_SYRINGE | INTRAVENOUS | Status: DC | PRN
  Administered 2017-01-31: 5 mg via INTRAVENOUS
  Administered 2017-01-31: 10 mg via INTRAVENOUS
  Administered 2017-01-31: 40 mg via INTRAVENOUS
  Administered 2017-01-31 (×4): 10 mg via INTRAVENOUS

## 2017-01-31 MED ORDER — BUPIVACAINE LIPOSOME 1.3 % IJ SUSP
INTRAMUSCULAR | Status: DC | PRN
  Administered 2017-01-31: 20 mL

## 2017-01-31 MED ORDER — CEFOTETAN DISODIUM-DEXTROSE 2-2.08 GM-% IV SOLR
INTRAVENOUS | Status: AC
  Filled 2017-01-31: qty 50

## 2017-01-31 MED ORDER — LIDOCAINE 2% (20 MG/ML) 5 ML SYRINGE
INTRAMUSCULAR | Status: AC
  Filled 2017-01-31: qty 5

## 2017-01-31 MED ORDER — DIPHENHYDRAMINE HCL 50 MG/ML IJ SOLN: 12.5000 mg | Freq: Four times a day (QID) | INTRAMUSCULAR | Status: DC | PRN

## 2017-01-31 MED ORDER — METHOCARBAMOL 1000 MG/10ML IJ SOLN: 1000.0000 mg | Freq: Four times a day (QID) | INTRAVENOUS | Status: DC | PRN

## 2017-01-31 MED ORDER — BISACODYL 10 MG RE SUPP: 10.0000 mg | Freq: Two times a day (BID) | RECTAL | Status: DC | PRN

## 2017-01-31 MED ORDER — LABETALOL HCL 5 MG/ML IV SOLN
INTRAVENOUS | Status: DC | PRN
  Administered 2017-01-31: 10 mg via INTRAVENOUS
  Administered 2017-01-31 (×2): 5 mg via INTRAVENOUS

## 2017-01-31 MED ORDER — ACETAMINOPHEN 10 MG/ML IV SOLN
1000.0000 mg | Freq: Once | INTRAVENOUS | Status: AC
  Administered 2017-01-31: 1000 mg via INTRAVENOUS

## 2017-01-31 MED ORDER — FENTANYL CITRATE (PF) 100 MCG/2ML IJ SOLN
INTRAMUSCULAR | Status: DC | PRN
  Administered 2017-01-31: 100 ug via INTRAVENOUS
  Administered 2017-01-31 (×5): 50 ug via INTRAVENOUS

## 2017-01-31 MED ORDER — MAGIC MOUTHWASH
15.0000 mL | Freq: Four times a day (QID) | ORAL | Status: DC | PRN
  Filled 2017-01-31: qty 15

## 2017-01-31 MED ORDER — ENALAPRILAT 1.25 MG/ML IV SOLN
0.6250 mg | Freq: Four times a day (QID) | INTRAVENOUS | Status: DC | PRN
  Filled 2017-01-31: qty 1

## 2017-01-31 MED ORDER — MIDAZOLAM HCL 2 MG/2ML IJ SOLN
INTRAMUSCULAR | Status: AC
  Filled 2017-01-31: qty 2

## 2017-01-31 MED ORDER — SUGAMMADEX SODIUM 200 MG/2ML IV SOLN
INTRAVENOUS | Status: DC | PRN
  Administered 2017-01-31: 125 mg via INTRAVENOUS

## 2017-01-31 MED ORDER — HYDRALAZINE HCL 20 MG/ML IJ SOLN
INTRAMUSCULAR | Status: AC
  Administered 2017-01-31: 5 mg
  Filled 2017-01-31: qty 1

## 2017-01-31 MED ORDER — FENTANYL CITRATE (PF) 100 MCG/2ML IJ SOLN
INTRAMUSCULAR | Status: AC
  Filled 2017-01-31: qty 2

## 2017-01-31 MED ORDER — METHYLENE BLUE 0.5 % INJ SOLN
INTRAVENOUS | Status: DC | PRN
  Administered 2017-01-31: 10 mL

## 2017-01-31 MED ORDER — LABETALOL HCL 5 MG/ML IV SOLN
INTRAVENOUS | Status: AC
  Filled 2017-01-31: qty 4

## 2017-01-31 MED ORDER — FENTANYL CITRATE (PF) 100 MCG/2ML IJ SOLN
INTRAMUSCULAR | Status: AC
  Administered 2017-01-31: 22:00:00
  Filled 2017-01-31: qty 2

## 2017-01-31 MED ORDER — ENSURE SURGERY PO LIQD
237.0000 mL | Freq: Two times a day (BID) | ORAL | Status: DC
  Administered 2017-02-01 – 2017-02-03 (×6): 237 mL via ORAL
  Filled 2017-01-31 (×6): qty 237

## 2017-01-31 MED ORDER — METHOCARBAMOL 500 MG PO TABS: 1000.0000 mg | ORAL_TABLET | Freq: Four times a day (QID) | ORAL | Status: DC | PRN

## 2017-01-31 MED ORDER — GABAPENTIN 300 MG PO CAPS
300.0000 mg | ORAL_CAPSULE | Freq: Two times a day (BID) | ORAL | Status: DC
  Administered 2017-01-31 – 2017-02-03 (×6): 300 mg via ORAL
  Filled 2017-01-31 (×5): qty 1
  Filled 2017-01-31: qty 3

## 2017-01-31 MED ORDER — FENTANYL CITRATE (PF) 100 MCG/2ML IJ SOLN
INTRAMUSCULAR | Status: AC
  Administered 2017-01-31: 50 ug via INTRAVENOUS
  Filled 2017-01-31: qty 2

## 2017-01-31 MED ORDER — PROPOFOL 10 MG/ML IV BOLUS
INTRAVENOUS | Status: DC | PRN
  Administered 2017-01-31: 160 mg via INTRAVENOUS

## 2017-01-31 MED ORDER — ALVIMOPAN 12 MG PO CAPS
12.0000 mg | ORAL_CAPSULE | Freq: Two times a day (BID) | ORAL | Status: DC
  Administered 2017-02-01 – 2017-02-02 (×3): 12 mg via ORAL
  Filled 2017-01-31 (×3): qty 1

## 2017-01-31 MED ORDER — MIDAZOLAM HCL 5 MG/5ML IJ SOLN
INTRAMUSCULAR | Status: DC | PRN
  Administered 2017-01-31: 2 mg via INTRAVENOUS

## 2017-01-31 MED ORDER — ALBUMIN HUMAN 5 % IV SOLN
INTRAVENOUS | Status: AC
  Filled 2017-01-31: qty 250

## 2017-01-31 MED ORDER — LIDOCAINE 2% (20 MG/ML) 5 ML SYRINGE
INTRAMUSCULAR | Status: DC | PRN
  Administered 2017-01-31: 100 mg via INTRAVENOUS

## 2017-01-31 MED ORDER — DEXAMETHASONE SODIUM PHOSPHATE 10 MG/ML IJ SOLN
INTRAMUSCULAR | Status: DC | PRN
  Administered 2017-01-31: 10 mg via INTRAVENOUS

## 2017-01-31 MED ORDER — SIMETHICONE 80 MG PO CHEW
80.0000 mg | CHEWABLE_TABLET | Freq: Four times a day (QID) | ORAL | Status: DC | PRN
Start: 2017-01-31 — End: 2017-02-03

## 2017-01-31 MED ORDER — METOPROLOL TARTRATE 5 MG/5ML IV SOLN: 5.0000 mg | Freq: Four times a day (QID) | INTRAVENOUS | Status: DC | PRN

## 2017-01-31 MED ORDER — LACTATED RINGERS IV BOLUS (SEPSIS): 1000.0000 mL | Freq: Three times a day (TID) | INTRAVENOUS | Status: AC | PRN

## 2017-01-31 MED ORDER — LIP MEDEX EX OINT
1.0000 "application " | TOPICAL_OINTMENT | Freq: Two times a day (BID) | CUTANEOUS | Status: DC
  Administered 2017-01-31 – 2017-02-03 (×6): 1 via TOPICAL
  Filled 2017-01-31 (×3): qty 7

## 2017-01-31 MED ORDER — LACTATED RINGERS IV SOLN
INTRAVENOUS | Status: DC
  Administered 2017-01-31: 11:00:00 via INTRAVENOUS

## 2017-01-31 MED ORDER — KETAMINE HCL 10 MG/ML IJ SOLN
INTRAMUSCULAR | Status: DC | PRN
  Administered 2017-01-31: 30 mg via INTRAVENOUS

## 2017-01-31 MED ORDER — FENTANYL CITRATE (PF) 100 MCG/2ML IJ SOLN
25.0000 ug | INTRAMUSCULAR | Status: DC | PRN
  Administered 2017-01-31 (×3): 50 ug via INTRAVENOUS

## 2017-01-31 MED ORDER — LIDOCAINE 2% (20 MG/ML) 5 ML SYRINGE
INTRAMUSCULAR | Status: DC | PRN
  Administered 2017-01-31: 1.5 mg/kg/h via INTRAVENOUS

## 2017-01-31 MED ORDER — ACETAMINOPHEN 10 MG/ML IV SOLN
INTRAVENOUS | Status: AC
  Filled 2017-01-31: qty 100

## 2017-01-31 MED ORDER — METHOCARBAMOL 500 MG PO TABS
1000.0000 mg | ORAL_TABLET | Freq: Four times a day (QID) | ORAL | Status: DC | PRN
  Administered 2017-02-01 – 2017-02-02 (×2): 1000 mg via ORAL
  Filled 2017-01-31 (×3): qty 2

## 2017-01-31 MED ORDER — LACTATED RINGERS IV SOLN
INTRAVENOUS | Status: DC
  Administered 2017-01-31 (×2): via INTRAVENOUS

## 2017-01-31 MED ORDER — BUPIVACAINE-EPINEPHRINE 0.25% -1:200000 IJ SOLN
INTRAMUSCULAR | Status: DC | PRN
  Administered 2017-01-31: 60 mL

## 2017-01-31 MED ORDER — PHENOL 1.4 % MT LIQD: 1.0000 | OROMUCOSAL | Status: DC | PRN

## 2017-01-31 MED ORDER — STERILE WATER FOR IRRIGATION IR SOLN
Status: DC | PRN
  Administered 2017-01-31: 750 mL via INTRAVESICAL

## 2017-01-31 MED ORDER — BUPIVACAINE-EPINEPHRINE 0.25% -1:200000 IJ SOLN
INTRAMUSCULAR | Status: AC
  Filled 2017-01-31: qty 1

## 2017-01-31 MED ORDER — HYDROCORTISONE 1 % EX CREA
1.0000 "application " | TOPICAL_CREAM | Freq: Three times a day (TID) | CUTANEOUS | Status: DC | PRN
  Filled 2017-01-31: qty 28

## 2017-01-31 MED ORDER — HYDRALAZINE HCL 20 MG/ML IJ SOLN
5.0000 mg | Freq: Four times a day (QID) | INTRAMUSCULAR | Status: DC | PRN
  Administered 2017-02-01: 20 mg via INTRAVENOUS
  Filled 2017-01-31: qty 1

## 2017-01-31 MED ORDER — ALUM & MAG HYDROXIDE-SIMETH 200-200-20 MG/5ML PO SUSP: 30.0000 mL | Freq: Four times a day (QID) | ORAL | Status: DC | PRN

## 2017-01-31 MED ORDER — PROCHLORPERAZINE EDISYLATE 5 MG/ML IJ SOLN: 5.0000 mg | INTRAMUSCULAR | Status: DC | PRN

## 2017-01-31 MED ORDER — PSYLLIUM 95 % PO PACK
1.0000 | PACK | Freq: Every day | ORAL | Status: DC
  Administered 2017-02-02 – 2017-02-03 (×3): 1 via ORAL
  Filled 2017-01-31 (×2): qty 1

## 2017-01-31 MED ORDER — HYDROCORTISONE 2.5 % RE CREA
1.0000 "application " | TOPICAL_CREAM | Freq: Four times a day (QID) | RECTAL | Status: DC | PRN
  Filled 2017-01-31: qty 28.35

## 2017-01-31 MED ORDER — HYDROMORPHONE HCL-NACL 0.5-0.9 MG/ML-% IV SOSY
0.5000 mg | PREFILLED_SYRINGE | INTRAVENOUS | Status: DC | PRN
  Administered 2017-01-31 (×2): 0.5 mg via INTRAVENOUS
  Administered 2017-02-01 (×4): 1 mg via INTRAVENOUS
  Administered 2017-02-01: 0.5 mg via INTRAVENOUS
  Administered 2017-02-01: 1 mg via INTRAVENOUS
  Filled 2017-01-31 (×3): qty 2
  Filled 2017-01-31 (×2): qty 1
  Filled 2017-01-31 (×3): qty 2

## 2017-01-31 MED ORDER — HYDRALAZINE HCL 20 MG/ML IJ SOLN: 5.0000 mg | INTRAMUSCULAR | Status: DC | PRN

## 2017-01-31 MED ORDER — SODIUM CHLORIDE 0.9 % IV SOLN
INTRAVENOUS | Status: DC | PRN
  Administered 2017-01-31: 1000 mL

## 2017-01-31 MED ORDER — GUAIFENESIN-DM 100-10 MG/5ML PO SYRP: 10.0000 mL | ORAL_SOLUTION | ORAL | Status: DC | PRN

## 2017-01-31 MED ORDER — MENTHOL 3 MG MT LOZG: 1.0000 | LOZENGE | OROMUCOSAL | Status: DC | PRN

## 2017-01-31 MED ORDER — PROPOFOL 10 MG/ML IV BOLUS
INTRAVENOUS | Status: AC
Start: 2017-01-31 — End: 2017-01-31
  Filled 2017-01-31: qty 20

## 2017-01-31 SURGICAL SUPPLY — 77 items
APPLIER CLIP 5 13 M/L LIGAMAX5 (MISCELLANEOUS)
APPLIER CLIP ROT 10 11.4 M/L (STAPLE)
BAG URINE DRAINAGE (UROLOGICAL SUPPLIES) ×3 IMPLANT
CABLE HIGH FREQUENCY MONO STRZ (ELECTRODE) ×3 IMPLANT
CELLS DAT CNTRL 66122 CELL SVR (MISCELLANEOUS) IMPLANT
CHLORAPREP W/TINT 26ML (MISCELLANEOUS) ×3 IMPLANT
CLIP APPLIE 5 13 M/L LIGAMAX5 (MISCELLANEOUS) IMPLANT
CLIP APPLIE ROT 10 11.4 M/L (STAPLE) IMPLANT
COUNTER NEEDLE 20 DBL MAG RED (NEEDLE) ×3 IMPLANT
COVER MAYO STAND STRL (DRAPES) ×9 IMPLANT
COVER SURGICAL LIGHT HANDLE (MISCELLANEOUS) ×3 IMPLANT
DECANTER SPIKE VIAL GLASS SM (MISCELLANEOUS) ×3 IMPLANT
DRAIN CHANNEL 19F RND (DRAIN) IMPLANT
DRAPE LAPAROSCOPIC ABDOMINAL (DRAPES) ×3 IMPLANT
DRAPE SURG IRRIG POUCH 19X23 (DRAPES) ×3 IMPLANT
DRSG OPSITE POSTOP 4X10 (GAUZE/BANDAGES/DRESSINGS) IMPLANT
DRSG OPSITE POSTOP 4X6 (GAUZE/BANDAGES/DRESSINGS) ×3 IMPLANT
DRSG OPSITE POSTOP 4X8 (GAUZE/BANDAGES/DRESSINGS) IMPLANT
DRSG TEGADERM 2-3/8X2-3/4 SM (GAUZE/BANDAGES/DRESSINGS) ×3 IMPLANT
DRSG TEGADERM 4X4.75 (GAUZE/BANDAGES/DRESSINGS) IMPLANT
ELECT PENCIL ROCKER SW 15FT (MISCELLANEOUS) ×6 IMPLANT
ELECT REM PT RETURN 15FT ADLT (MISCELLANEOUS) ×3 IMPLANT
ENDOLOOP SUT PDS II  0 18 (SUTURE)
ENDOLOOP SUT PDS II 0 18 (SUTURE) IMPLANT
EVACUATOR SILICONE 100CC (DRAIN) IMPLANT
GAUZE SPONGE 2X2 8PLY STRL LF (GAUZE/BANDAGES/DRESSINGS) ×1 IMPLANT
GAUZE SPONGE 4X4 12PLY STRL (GAUZE/BANDAGES/DRESSINGS) ×3 IMPLANT
GLOVE ECLIPSE 8.0 STRL XLNG CF (GLOVE) ×6 IMPLANT
GLOVE INDICATOR 8.0 STRL GRN (GLOVE) ×6 IMPLANT
GOWN STRL REUS W/TWL XL LVL3 (GOWN DISPOSABLE) ×12 IMPLANT
IRRIG SUCT STRYKERFLOW 2 WTIP (MISCELLANEOUS) ×3
IRRIGATION SUCT STRKRFLW 2 WTP (MISCELLANEOUS) ×1 IMPLANT
LEGGING LITHOTOMY PAIR STRL (DRAPES) IMPLANT
LUBRICANT JELLY K Y 4OZ (MISCELLANEOUS) IMPLANT
PACK COLON (CUSTOM PROCEDURE TRAY) ×3 IMPLANT
PAD POSITIONING PINK XL (MISCELLANEOUS) ×3 IMPLANT
PORT LAP GEL ALEXIS MED 5-9CM (MISCELLANEOUS) IMPLANT
POSITIONER SURGICAL ARM (MISCELLANEOUS) IMPLANT
RTRCTR WOUND ALEXIS 18CM MED (MISCELLANEOUS)
SCISSORS LAP 5X35 DISP (ENDOMECHANICALS) ×3 IMPLANT
SEALER TISSUE G2 STRG ARTC 35C (ENDOMECHANICALS) ×3 IMPLANT
SLEEVE ADV FIXATION 5X100MM (TROCAR) IMPLANT
SLEEVE XCEL OPT CAN 5 100 (ENDOMECHANICALS) IMPLANT
SPONGE GAUZE 2X2 STER 10/PKG (GAUZE/BANDAGES/DRESSINGS) ×2
SPONGE LAP 18X18 X RAY DECT (DISPOSABLE) ×3 IMPLANT
STAPLER 90 3.5 STAND SLIM (STAPLE) ×3
STAPLER 90 3.5 STD SLIM (STAPLE) ×1 IMPLANT
STAPLER PROXIMATE 75MM BLUE (STAPLE) ×3 IMPLANT
STAPLER VISISTAT 35W (STAPLE) IMPLANT
SUT MNCRL AB 4-0 PS2 18 (SUTURE) ×3 IMPLANT
SUT PDS AB 1 CTX 36 (SUTURE) ×6 IMPLANT
SUT PDS AB 1 TP1 96 (SUTURE) IMPLANT
SUT PROLENE 0 CT 2 (SUTURE) IMPLANT
SUT PROLENE 2 0 SH DA (SUTURE) IMPLANT
SUT SILK 2 0 (SUTURE) ×2
SUT SILK 2 0 SH CR/8 (SUTURE) ×3 IMPLANT
SUT SILK 2-0 18XBRD TIE 12 (SUTURE) ×1 IMPLANT
SUT SILK 3 0 (SUTURE) ×2
SUT SILK 3 0 SH CR/8 (SUTURE) ×3 IMPLANT
SUT SILK 3-0 18XBRD TIE 12 (SUTURE) ×1 IMPLANT
SUT V-LOC BARB 180 2/0GR6 GS22 (SUTURE) ×3
SUT VIC AB 2-0 SH 18 (SUTURE) ×18 IMPLANT
SUT VICRYL 0 UR6 27IN ABS (SUTURE) ×3 IMPLANT
SUTURE V-LC BRB 180 2/0GR6GS22 (SUTURE) ×1 IMPLANT
SYS LAPSCP GELPORT 120MM (MISCELLANEOUS) ×3
SYSTEM LAPSCP GELPORT 120MM (MISCELLANEOUS) ×1 IMPLANT
TAPE UMBILICAL COTTON 1/8X30 (MISCELLANEOUS) ×3 IMPLANT
TOWEL OR 17X26 10 PK STRL BLUE (TOWEL DISPOSABLE) IMPLANT
TOWEL OR NON WOVEN STRL DISP B (DISPOSABLE) ×3 IMPLANT
TRAY FOLEY W/METER SILVER 16FR (SET/KITS/TRAYS/PACK) IMPLANT
TROCAR ADV FIXATION 5X100MM (TROCAR) IMPLANT
TROCAR BLADELESS OPT 5 100 (ENDOMECHANICALS) ×3 IMPLANT
TROCAR HASSON 5MM (TROCAR) ×3 IMPLANT
TROCAR XCEL NON-BLD 11X100MML (ENDOMECHANICALS) IMPLANT
TUBING CONNECTING 10 (TUBING) IMPLANT
TUBING CONNECTING 10' (TUBING)
TUBING INSUF HEATED (TUBING) ×3 IMPLANT

## 2017-01-31 NOTE — Anesthesia Preprocedure Evaluation (Signed)
Anesthesia Evaluation  Patient identified by MRN, date of birth, ID band Patient awake    Reviewed: Allergy & Precautions  Airway Mallampati: II  TM Distance: >3 FB     Dental   Pulmonary shortness of breath, former smoker,    breath sounds clear to auscultation       Cardiovascular hypertension,  Rhythm:Regular Rate:Normal     Neuro/Psych    GI/Hepatic Neg liver ROS, GERD  ,  Endo/Other  negative endocrine ROS  Renal/GU negative Renal ROS     Musculoskeletal   Abdominal   Peds  Hematology   Anesthesia Other Findings   Reproductive/Obstetrics                             Anesthesia Physical Anesthesia Plan  ASA: III  Anesthesia Plan: General   Post-op Pain Management:    Induction: Intravenous  PONV Risk Score and Plan: 3 and Ondansetron, Dexamethasone, Propofol and Midazolam  Airway Management Planned: Oral ETT  Additional Equipment:   Intra-op Plan:   Post-operative Plan: Possible Post-op intubation/ventilation  Informed Consent: I have reviewed the patients History and Physical, chart, labs and discussed the procedure including the risks, benefits and alternatives for the proposed anesthesia with the patient or authorized representative who has indicated his/her understanding and acceptance.   Dental advisory given  Plan Discussed with: CRNA and Anesthesiologist  Anesthesia Plan Comments:         Anesthesia Quick Evaluation

## 2017-01-31 NOTE — Anesthesia Postprocedure Evaluation (Addendum)
Anesthesia Post Note  Patient: Marissa Blankenship  Procedure(s) Performed: Procedure(s) (LRB): LAPAROSCOPIC LYSIS OF ADHESIONS, ILEOCECECTOMY, STRICTUROPLASTY, BLADDER REPAIR, RIGHT SALPINGECTOMY (N/A)     Patient location during evaluation: PACU Anesthesia Type: General Level of consciousness: awake and alert Pain management: pain level controlled Vital Signs Assessment: post-procedure vital signs reviewed and stable Respiratory status: spontaneous breathing, nonlabored ventilation, respiratory function stable and patient connected to nasal cannula oxygen Cardiovascular status: blood pressure returned to baseline and stable Postop Assessment: no signs of nausea or vomiting Anesthetic complications: no Comments: Defect noted on tooth #9 after intubation. Due to not completing preop examination, I was unsure if the defect was present prior to intubation. In PACU, [name] stated she did not feel any changes in her bite or tooth structure. She believes her tooth was chipped prior to her arrival today. I instructed [name] to contact us if she determines the defect is new and possibly happened during intubation. Per Dr. Nyoka Cowden, a dental advisory was given prior to proceeding to the operating room.   Per PACU RN Amy, family confirmed patient had chipped tooth prior to anesthetic.     Last Vitals:  Vitals:   01/31/17 1945 01/31/17 2000  BP: (!) 143/89 132/85  Pulse: 78 74  Resp: 17 17  Temp:  36.4 C    Last Pain:  Vitals:   01/31/17 2000  TempSrc:   PainSc: Asleep                 Effie Berkshire

## 2017-01-31 NOTE — Discharge Instructions (Signed)
SURGERY: POST OP INSTRUCTIONS (Surgery for small bowel obstruction, colon resection, etc)   ######################################################################  EAT Gradually transition to a high fiber diet with a fiber supplement over the next few days after discharge  WALK Walk an hour a day.  Control your pain to do that.    CONTROL PAIN Control pain so that you can walk, sleep, tolerate sneezing/coughing, go up/down stairs.  HAVE A BOWEL MOVEMENT DAILY Keep your bowels regular to avoid problems.  OK to try a laxative to override constipation.  OK to use an antidairrheal to slow down diarrhea.  Call if not better after 2 tries  CALL IF YOU HAVE PROBLEMS/CONCERNS Call if you are still struggling despite following these instructions. Call if you have concerns not answered by these instructions  ######################################################################   DIET Follow a light diet the first few days at home.  Start with a bland diet such as soups, liquids, starchy foods, low fat foods, etc.  If you feel full, bloated, or constipated, stay on a ful liquid or pureed/blenderized diet for a few days until you feel better and no longer constipated. Be sure to drink plenty of fluids every day to avoid getting dehydrated (feeling dizzy, not urinating, etc.). Gradually add a fiber supplement to your diet over the next week.  Gradually get back to a regular solid diet.  Avoid fast food or heavy meals the first week as you are more likely to get nauseated. It is expected for your digestive tract to need a few months to get back to normal.  It is common for your bowel movements and stools to be irregular.  You will have occasional bloating and cramping that should eventually fade away.  Until you are eating solid food normally, off all pain medications, and back to regular activities; your bowels will not be normal. Focus on eating a low-fat, high fiber diet the rest of your life  (See Getting to [place], below).  CARE of your INCISION or WOUND It is good for closed incision and even open wounds to be washed every day.  Shower every day.  Short baths are fine.  Wash the incisions and wounds clean with soap & water.    If you have a closed incision(s), wash the incision with soap & water every day.  You may leave closed incisions open to air if it is dry.   You may cover the incision with clean gauze & replace it after your daily shower for comfort. If you have skin tapes (Steristrips) or skin glue (Dermabond) on your incision, leave them in place.  They will fall off on their own like a scab.  You may trim any edges that curl up with clean scissors.  If you have staples, set up an appointment for them to be removed in the office in 10 days after surgery.  If you have a drain, wash around the skin exit site with soap & water and place a new dressing of gauze or band aid around the skin every day.  Keep the drain site clean & dry.    If you have an open wound with packing, see wound care instructions.  In general, it is encouraged that you remove your dressing and packing, shower with soap & water, and replace your dressing once a day.  Pack the wound with clean gauze moistened with normal (0.9%) saline to keep the wound moist & uninfected.  Pressure on the dressing for 30 minutes will stop most wound  bleeding.  Eventually your body will heal & pull the open wound closed over the next few months.  Raw open wounds will occasionally bleed or secrete yellow drainage until it heals closed.  Drain sites will drain a little until the drain is removed.  Even closed incisions can have mild bleeding or drainage the first few days until the skin edges scab over & seal.   If you have an open wound with a wound vac, see wound vac care instructions.     ACTIVITIES as tolerated Start light daily activities --- self-care, walking, climbing stairs-- beginning the day after surgery.   Gradually increase activities as tolerated.  Control your pain to be active.  Stop when you are tired.  Ideally, walk several times a day, eventually an hour a day.   Most people are back to most day-to-day activities in a few weeks.  It takes 4-8 weeks to get back to unrestricted, intense activity. If you can walk 30 minutes without difficulty, it is safe to try more intense activity such as jogging, treadmill, bicycling, low-impact aerobics, swimming, etc. Save the most intensive and strenuous activity for last (Usually 4-8 weeks after surgery) such as sit-ups, heavy lifting, contact sports, etc.  Refrain from any intense heavy lifting or straining until you are off narcotics for pain control.  You will have off days, but things should improve week-by-week. DO NOT PUSH THROUGH PAIN.  Let pain be your guide: If it hurts to do something, don't do it.  Pain is your body warning you to avoid that activity for another week until the pain goes down. You may drive when you are no longer taking narcotic prescription pain medication, you can comfortably wear a seatbelt, and you can safely make sudden turns/stops to protect yourself without hesitating due to pain. You may have sexual intercourse when it is comfortable. If it hurts to do something, stop.  MEDICATIONS Take your usually prescribed home medications unless otherwise directed.   Blood thinners:  Usually you can restart any strong blood thinners after the second postoperative day.  It is OK to take aspirin right away.     If you are on strong blood thinners (warfarin/Coumadin, Plavix, Xerelto, Eliquis, Pradaxa, etc), discuss with your surgeon, medicine PCP, and/or cardiologist for instructions on when to restart the blood thinner & if blood monitoring is needed (PT/INR blood check, etc).     PAIN CONTROL Pain after surgery or related to activity is often due to strain/injury to muscle, tendon, nerves and/or incisions.  This pain is usually  short-term and will improve in a few months.  To help speed the process of healing and to get back to regular activity more quickly, DO THE FOLLOWING THINGS TOGETHER: 1. Increase activity gradually.  DO NOT PUSH THROUGH PAIN 2. Use Ice and/or Heat 3. Try Gentle Massage and/or Stretching 4. Take over the counter pain medication 5. Take Narcotic prescription pain medication for more severe pain  Good pain control = faster recovery.  It is better to take more medicine to be more active than to stay in bed all day to avoid medications. 1.  Increase activity gradually Avoid heavy lifting at first, then increase to lifting as tolerated over the next 6 weeks. Do not push through the pain.  Listen to your body and avoid positions and maneuvers than reproduce the pain.  Wait a few days before trying something more intense Walking an hour a day is encouraged to help your body recover faster  and more safely.  Start slowly and stop when getting sore.  If you can walk 30 minutes without stopping or pain, you can try more intense activity (running, jogging, aerobics, cycling, swimming, treadmill, sex, sports, weightlifting, etc.) Remember: If it hurts to do it, then dont do it! 2. Use Ice and/or Heat You will have swelling and bruising around the incisions.  This will take several weeks to resolve. Ice packs or heating pads (6-8 times a day, 30-60 minutes at a time) will help sooth soreness & bruising. Some people prefer to use ice alone, heat alone, or alternate between ice & heat.  Experiment and see what works best for you.  Consider trying ice for the first few days to help decrease swelling and bruising; then, switch to heat to help relax sore spots and speed recovery. Shower every day.  Short baths are fine.  It feels good!  Keep the incisions and wounds clean with soap & water.   3. Try Gentle Massage and/or Stretching Massage at the area of pain many times a day Stop if you feel pain - do not  overdo it 4. Take over the counter pain medication This helps the muscle and nerve tissues become less irritable and calm down faster Choose ONE of the following over-the-counter anti-inflammatory medications: Acetaminophen 569m tabs (Tylenol) 1-2 pills with every meal and just before bedtime (avoid if you have liver problems or if you have acetaminophen in you narcotic prescription) Naproxen 2231mtabs (ex. Aleve, Naprosyn) 1-2 pills twice a day (avoid if you have kidney, stomach, IBD, or bleeding problems) Ibuprofen 2002mabs (ex. Advil, Motrin) 3-4 pills with every meal and just before bedtime (avoid if you have kidney, stomach, IBD, or bleeding problems) Take with food/snack several times a day as directed for at least 2 weeks to help keep pain / soreness down & more manageable. 5. Take Narcotic prescription pain medication for more severe pain A prescription for strong pain control is often given to you upon discharge (for example: oxycodone/Percocet, hydrocodone/Norco/Vicodin, or tramadol/Ultram) Take your pain medication as prescribed. Be mindful that most narcotic prescriptions contain Tylenol (acetaminophen) as well - avoid taking too much Tylenol. If you are having problems/concerns with the prescription medicine (does not control pain, nausea, vomiting, rash, itching, etc.), please call us [phone] see if we need to switch you to a different pain medicine that will work better for you and/or control your side effects better. If you need a refill on your pain medication, you must call the office before 4 pm and on weekdays only.  By federal law, prescriptions for narcotics cannot be called into a pharmacy.  They must be filled out on paper & picked up from our office by the patient or authorized caretaker.  Prescriptions cannot be filled after 4 pm nor on weekends.    WHEN TO CALL US [phone] uncontrolled or worsening pain  Fever over 101 F (38.5 C) Concerns with  the incision: Worsening pain, redness, rash/hives, swelling, bleeding, or drainage Reactions / problems with new medications (itching, rash, hives, nausea, etc.) Nausea and/or vomiting Difficulty urinating Difficulty breathing Worsening fatigue, dizziness, lightheadedness, blurred vision Other concerns If you are not getting better after two weeks or are noticing you are getting worse, contact our office [phone] for further advice.  We may need to adjust your medications, re-evaluate you in the office, send you to the emergency room, or see what other things we can do to help. The  clinic staff is available to answer your questions during regular business hours (8:30am-5pm).  Please dont hesitate to call and ask to speak to one of our nurses for clinical concerns.    A surgeon from [facility] is always on call at the hospitals 24 hours/day If you have a medical emergency, go to the nearest emergency room or call 911.  FOLLOW UP in our office One the day of your discharge from the hospital (or the next business weekday), please call [facility] to set up or confirm an appointment to see your surgeon in the office for a follow-up appointment.  Usually it is 2-3 weeks after your surgery.   If you have skin staples at your incision(s), let the office know so we can set up a time in the office for the nurse to remove them (usually around 10 days after surgery). Make sure that you call for appointments the day of discharge (or the next business weekday) from the hospital to ensure a convenient appointment time. IF YOU HAVE DISABILITY OR FAMILY LEAVE FORMS, BRING THEM TO THE OFFICE FOR PROCESSING.  DO NOT GIVE THEM TO YOUR DOCTOR.  [facility] [address] [place], [place], [place]  [address] ? [phone] - Main [phone] - Page,  [phone] - Fax [email or web address]  GETTING TO GOOD BOWEL HEALTH. It is  expected for your digestive tract to need a few months to get back to normal.  It is common for your bowel movements and stools to be irregular.  You will have occasional bloating and cramping that should eventually fade away.  Until you are eating solid food normally, off all pain medications, and back to regular activities; your bowels will not be normal.   Avoiding constipation The goal: ONE SOFT BOWEL MOVEMENT A DAY!    Drink plenty of fluids.  Choose water first. TAKE A FIBER SUPPLEMENT EVERY DAY THE REST OF YOUR LIFE During your first week back home, gradually add back a fiber supplement every day Experiment which form you can tolerate.   There are many forms such as powders, tablets, wafers, gummies, etc Psyllium bran (Metamucil), methylcellulose (Citrucel), Miralax or Glycolax, Benefiber, Flax Seed.  Adjust the dose week-by-week (1/2 dose/day to 6 doses a day) until you are moving your bowels 1-2 times a day.  Cut back the dose or try a different fiber product if it is giving you problems such as diarrhea or bloating. Sometimes a laxative is needed to help jump-start bowels if constipated until the fiber supplement can help regulate your bowels.  If you are tolerating eating & you are farting, it is okay to try a gentle laxative such as double dose MiraLax, prune juice, or Milk of Magnesia.  Avoid using laxatives too often. Stool softeners can sometimes help counteract the constipating effects of narcotic pain medicines.  It can also cause diarrhea, so avoid using for too long. If you are still constipated despite taking fiber daily, eating solids, and a few doses of laxatives, call our office. Controlling diarrhea Try drinking liquids and eating bland foods for a few days to avoid stressing your intestines further. Avoid dairy products (especially milk & ice cream) for a short time.  The intestines often can lose the ability to digest lactose when stressed. Avoid foods that cause gassiness or  bloating.  Typical foods include beans and other legumes, cabbage, broccoli, and dairy foods.  Avoid greasy, spicy, fast foods.  Every person has  some sensitivity to other foods, so listen to your body and avoid those foods that trigger problems for you. Probiotics (such as active yogurt, Align, etc) may help repopulate the intestines and colon with normal bacteria and calm down a sensitive digestive tract Adding a fiber supplement gradually can help thicken stools by absorbing excess fluid and retrain the intestines to act more normally.  Slowly increase the dose over a few weeks.  Too much fiber too soon can backfire and cause cramping & bloating. It is okay to try and slow down diarrhea with a few doses of antidiarrheal medicines.   Bismuth subsalicylate (ex. Kayopectate, Pepto Bismol) for a few doses can help control diarrhea.  Avoid if pregnant.   Loperamide (Imodium) can slow down diarrhea.  Start with one tablet (100m) first.  Avoid if you are having fevers or severe pain.  ILEOSTOMY PATIENTS WILL HAVE CHRONIC DIARRHEA since their colon is not in use.    Drink plenty of liquids.  You will need to drink even more glasses of water/liquid a day to avoid getting dehydrated. Record output from your ileostomy.  Expect to empty the bag every 3-4 hours at first.  Most people with a permanent ileostomy empty their bag 4-6 times at the least.   Use antidiarrheal medicine (especially Imodium) several times a day to avoid getting dehydrated.  Start with a dose at bedtime & breakfast.  Adjust up or down as needed.  Increase antidiarrheal medications as directed to avoid emptying the bag more than 8 times a day (every 3 hours). Work with your wound ostomy nurse to learn care for your ostomy.  See ostomy care instructions. TROUBLESHOOTING IRREGULAR BOWELS 1) Start with a soft & bland diet. No spicy, greasy, or fried foods.  2) Avoid gluten/wheat or dairy products from diet to see if symptoms improve. 3) Miralax  17gm or flax seed mixed in 8[place]oc water or juice-daily. May use 2-4 times a day as needed. 4) Gas-X, Phazyme, etc. as needed for gas & bloating.  5) Prilosec (omeprazole) over-the-counter as needed 6)  Consider probiotics (Align, Activa, etc) to help calm the bowels down  Call your doctor if you are getting worse or not getting better.  Sometimes further testing (cultures, endoscopy, X-ray studies, CT scans, bloodwork, etc.) may be needed to help diagnose and treat the cause of the diarrhea. [facility] [place]  [phone]- Main.    [phone] - Toll Free.   [phone]- Fax [email or web address]   Crohn Disease Crohn disease is a long-lasting (chronic) disease that affects your gastrointestinal (GI) tract. It often causes irritation and swelling (inflammation) in your small intestine and the beginning of your large intestine. However, it can affect any part of your GI tract. Crohn disease is part of a group of illnesses that are known as inflammatory bowel disease (IBD). Crohn disease may start slowly and get worse over time. Symptoms may come and go. They may also disappear for months or even years at a time (remission). What are the causes? The exact cause of Crohn disease is not known. It may be a response that causes your body's defense system (immune system) to mistakenly attack healthy cells and tissues (autoimmune response). Your genes and your environment may also play a role. What increases the risk? You may be at greater risk for Crohn disease if you:  Have other family members with Crohn disease or another IBD.  Use  any tobacco products, including cigarettes, chewing tobacco, or electronic cigarettes.  Are in your 22s.  Have [place] European ancestry.  What are the signs or symptoms? The main signs and symptoms of Crohn disease involve your GI tract. These include:  Diarrhea.  Rectal  bleeding.  An urgent need to move your bowels.  The feeling that you are not finished having a bowel movement.  Abdominal pain or cramping.  Constipation.  General signs and symptoms of Crohn disease may also include:  Unexplained weight loss.  Fatigue.  Fever.  Nausea.  Loss of appetite.  Joint pain  Changes in vision.  Red bumps on your skin.  How is this diagnosed? Your health care provider may suspect Crohn disease based on your symptoms and your medical history. Your health care provider will do a physical exam. You may need to see a health care provider who specializes in diseases of the digestive tract (gastroenterologist). You may also have tests to help your health care providers make a diagnosis. These may include:  Blood tests.  Stool sample tests.  Imaging tests, such as X-rays and CT scans.  Tests to examine the inside of your intestines using a long, flexible tube that has a light and a camera on the end (endoscopy or colonoscopy).  A procedure to take tissue samples from inside your bowel (biopsy) to be examined under a microscope.  How is this treated? There is no cure for Crohn disease. Treatment will focus on managing your symptoms. Crohn disease affects each person differently. Your treatment may include:  Resting your bowels. Drinking only clear liquids or getting nutrition through an IV for a period of time gives your bowels a chance to heal because they are not passing stools.  Medicines. These may be used alone or in combination (combination therapy). These may include antibiotic medicines. You may be given medicines that help to: ? Reduce inflammation. ? Control your immune system activity. ? Fight infections. ? Relieve cramps and prevent diarrhea. ? Control your pain.  Surgery. You may need surgery if: ? Medicines and other treatments are no longer working. ? You develop complications from severe Crohn disease. ? A section of your  intestine becomes so damaged that it needs to be removed.  Follow these instructions at home:  Take medicines only as directed by your health care provider.  If you were prescribed an antibiotic medicine, finish it all even if you start to feel better.  Keep all follow-up visits as directed by your health care provider. This is important.  Talk with your health care provider about changing your diet. This may help your symptoms. Your health care provide may recommend changes, such as: ? Drinking more fluids. ? Avoiding milk and other foods that contain lactose. ? Eating a low-fat diet. ? Avoiding high-fiber foods, such as popcorn and nuts. ? Avoiding carbonated beverages, such as soda. ? Eating smaller meals more often rather than eating large meals. ? Keeping a food diary to identify foods that make your symptoms better or worse.  Do not use any tobacco products, including cigarettes, chewing tobacco, or electronic cigarettes. If you need help quitting, ask your health care provider.  Limit alcohol intake to no more than 1 drink per day for nonpregnant women and 2 drinks per day for men. One drink equals 12 ounces of beer, 5 ounces of wine, or 1 ounces of hard liquor.  Exercise daily or as directed by your health care provider. Contact a health care provider  if:  You have diarrhea, abdominal cramps, and other gastrointestinal problems that are present almost all of the time.  Your symptoms do not improve with treatment.  You continue to lose weight.  You develop a rash or sores on your skin.  You develop eye problems.  You have a fever.  Your symptoms get worse.  You develop new symptoms. Get help right away if:  You have bloody diarrhea.  You develop severe abdominal pain.  You cannot pass stools. This information is not intended to replace advice given to you by your health care provider. Make sure you discuss any questions you have with your health care  provider. Document Released: 04/13/2005 Document Revised: 11/12/2015 Document Reviewed: 02/19/2014 Elsevier Interactive Patient Education  2018 [employer].

## 2017-01-31 NOTE — Progress Notes (Signed)
[facility]  [address] [place], [place] [address] Phone: [phone]  FAX: [place] [phone] [birth year]  CARE TEAM:  PCP: Kerin Perna, NP  Outpatient Care Team: Patient Care Team: Kerin Perna, NP as PCP - General (Internal Medicine) Leighton Ruff, MD as Consulting Physician (General Surgery) Danis, Kirke Corin, MD as Consulting Physician (Gastroenterology)  Inpatient Treatment Team: Treatment Team: Attending Provider: Louellen Molder, MD; Rounding Team: Redmond Baseman, MD; Technician: Sueanne Margarita, NT; Consulting Physician: Edison Pace Md, MD; Consulting Physician: Doran Stabler, MD; Consulting Physician: Manus Gunning, MD; Technician: Etheleen Sia, NT   Problem List:   Principal Problem:   Exacerbation of Crohn's disease with intestinal obstruction [facility]) Active Problems:   Essential hypertension   ACID REFLUX DISEASE   Lower urinary tract infectious disease   Anemia   Bradycardia   Day of Surgery  01/29/2017 - 01/31/2017  Procedure(s): LAPAROSCOPIC ILEOCECECTOMY, POSSIBLE OSTOMY, POSSIBLE STRICTUROPLASTY POSSIBLE OOPHORECTOMY   Assessment  Crohn's disease with stricturing abscess and possible fistula vagina.  Worsening despite aggressive immunosuppressive and biologic regimen for the past two years.  Needs resection.  Plan:  I called discussed with her original surgeon, Dr. Marcello Moores.  She is not available this week.  She agrees that the patient needs ileocolonic resection.  Tremors are as much small bowel as possible.  Hopefully can do anastomosis and avoid ileostomy, but in the setting of fistula and abscesses risk is increased.  Patient is ready to consider surgery now.  Start out laparoscopically with low threshold to convert to open or at least hand-assisted  The anatomy & physiology of the digestive tract was discussed.  The pathophysiology of the colon was  discussed.  Natural history risks without surgery was discussed.   I feel the risks of no intervention will lead to serious problems that outweigh the operative risks; therefore, I recommended a partial colectomy to remove the pathology.  Minimally invasive (Robotic/Laparoscopic) & open techniques were discussed.   Risks such as bleeding, infection, abscess, leak, reoperation, injury to other organs, need for repair of tissues / organs, possible ostomy, hernia, heart attack, stroke, death, and other risks were discussed.  I noted a good likelihood this will help address the problem.   Goals of post-operative recovery were discussed as well.   Need for adequate nutrition, daily bowel regimen and healthy physical activity, to optimize recovery was noted as well. We will work to minimize complications.  Educational materials were available as well.  Questions were answered.  The patient expresses understanding & wishes to proceed with surgery.   -VTE prophylaxis- SCDs, etc -mobilize as tolerated to help recovery  20 minutes spent in review, evaluation, examination, counseling, and coordination of care.  More than 50% of that time was spent in counseling.  Adin Hector, M.D., F.A.C.S. Gastrointestinal and Minimally Invasive Surgery [facility] [address] [place], [place] [address] [phone] Main / Paging   01/31/2017    Subjective: (Chief complaint)  Tolerated pills and some sips.  Abdominal pain minimal.  Minimal flatus.  Objective:  Vital signs:  Vitals:   01/30/17 0536 01/30/17 2049 01/31/17 0620 01/31/17 1009  BP: 116/73 136/84 136/89 127/80  Pulse: (!) 50 61 (!) 54 (!) 57  Resp: _0 Temp: 97.9 F (36.6 C) 98.4 F (36.9 C) 98.3 F (36.8 C) 98.4 F (36.9 C)  TempSrc: Oral  Oral Oral Oral  SpO2: 100% 100% 100% 100%  Weight:      Height:        Last BM Date: 01/29/17  Intake/Output   Yesterday:  07/16 0701 -  07/17 0700 In: 2426.7 [I.V.:2376.7; IV Piggyback:50] Out: 1500 [Urine:1500] This shift:  No intake/output data recorded.  Bowel function:  Flatus: YES  BM:  No  Drain: (No drain)   Physical Exam:  General: Pt awake/alert/oriented x4 in no acute distress Eyes: PERRL, normal EOM.  Sclera clear.  No icterus Neuro: CN II-XII intact w/o focal sensory/motor deficits. Lymph: No head/neck/groin lymphadenopathy Psych:  No delerium/psychosis/paranoia HENT: Normocephalic, Mucus membranes moist.  No thrush Neck: Supple, No tracheal deviation Chest: No chest wall pain w good excursion CV:  Pulses intact.  Regular rhythm MS: Normal AROM mjr joints.  No obvious deformity  Abdomen: Soft.  Nondistended.  Tenderness at suprapubic region - unchanged.  No evidence of peritonitis.  No incarcerated hernias.  Ext:  No deformity.  No mjr edema.  No cyanosis Skin: No petechiae / purpura  Results:   Labs: Results for orders placed or performed during the hospital encounter of 01/29/17 (from the past 48 hour(s))  Urinalysis, Routine w reflex microscopic     Status: Abnormal   Collection Time: 01/29/17  8:03 PM  Result Value Ref Range   Color, Urine YELLOW YELLOW   APPearance CLEAR CLEAR   Specific Gravity, Urine 1.011 1.005 - 1.030   pH 7.0 5.0 - 8.0   Glucose, UA NEGATIVE NEGATIVE mg/dL   Hgb urine dipstick SMALL (A) NEGATIVE   Bilirubin Urine NEGATIVE NEGATIVE   Ketones, ur 20 (A) NEGATIVE mg/dL   Protein, ur NEGATIVE NEGATIVE mg/dL   Nitrite POSITIVE (A) NEGATIVE   Leukocytes, UA SMALL (A) NEGATIVE   RBC / HPF 0-5 0 - 5 RBC/hpf   WBC, UA 6-30 0 - 5 WBC/hpf   Bacteria, UA FEW (A) NONE SEEN   Squamous Epithelial / LPF 0-5 (A) NONE SEEN   Mucous PRESENT   Urine culture     Status: Abnormal (Preliminary result)   Collection Time: 01/29/17  8:03 PM  Result Value Ref Range   Specimen Description URINE, CLEAN CATCH    Special Requests NONE    Culture (A)     >=100,000 COLONIES/mL  ESCHERICHIA COLI SUSCEPTIBILITIES TO FOLLOW Performed at [facility], [address] [place], [place] [address]    Report Status PENDING   Lipase, blood     Status: Abnormal   Collection Time: 01/29/17  8:04 PM  Result Value Ref Range   Lipase 10 (L) 11 - 51 U/L  Comprehensive metabolic panel     Status: Abnormal   Collection Time: 01/29/17  8:04 PM  Result Value Ref Range   Sodium 136 135 - 145 mmol/L   Potassium 3.6 3.5 - 5.1 mmol/L   Chloride 102 101 - 111 mmol/L   CO2 25 22 - 32 mmol/L   Glucose, Bld 98 65 - 99 mg/dL   BUN 8 6 - 20 mg/dL   Creatinine, Ser 0.64 0.44 - 1.00 mg/dL   Calcium 8.7 (L) 8.9 - 10.3 mg/dL   Total Protein 6.9 6.5 - 8.1 g/dL   Albumin 3.4 (L) 3.5 - 5.0 g/dL   AST 15 15 - 41 U/L   ALT 13 (L) 14 - 54 U/L   Alkaline Phosphatase 62 38 - 126 U/L   Total Bilirubin 0.3 0.3 - 1.2 mg/dL   GFR calc  non Af Amer >60 >60 mL/min   GFR calc Af Amer >60 >60 mL/min    Comment: (NOTE) The eGFR has been calculated using the CKD EPI equation. This calculation has not been validated in all clinical situations. eGFR's persistently <60 mL/min signify possible Chronic Kidney Disease.    Anion gap 9 5 - 15  CBC     Status: Abnormal   Collection Time: 01/29/17  8:04 PM  Result Value Ref Range   WBC 7.8 4.0 - 10.5 K/uL   RBC 4.44 3.87 - 5.11 MIL/uL   Hemoglobin 11.2 (L) 12.0 - 15.0 g/dL   HCT 34.9 (L) 36.0 - 46.0 %   MCV 78.6 78.0 - 100.0 fL   MCH 25.2 (L) 26.0 - 34.0 pg   MCHC 32.1 30.0 - 36.0 g/dL   RDW 18.2 (H) 11.5 - 15.5 %   Platelets 284 150 - 400 K/uL  I-Stat beta hCG blood, ED     Status: None   Collection Time: 01/29/17  8:13 PM  Result Value Ref Range   I-stat hCG, quantitative <5.0 <5 mIU/mL   Comment 3            Comment:   GEST. AGE      CONC.  (mIU/mL)   <=1 WEEK        5 - 50     2 WEEKS       50 - 500     3 WEEKS       100 - 10,000     4 WEEKS     1,000 - 30,000        FEMALE AND NON-PREGNANT FEMALE:     LESS THAN 5 mIU/mL   HIV  antibody (Routine Testing)     Status: None   Collection Time: 01/30/17  5:06 AM  Result Value Ref Range   HIV Screen 4th Generation wRfx Non Reactive Non Reactive    Comment: (NOTE) Performed At: [facility] [place], [place] [id number] Lindon Romp MD HA:[id number]   Basic metabolic panel     Status: Abnormal   Collection Time: 01/30/17  5:06 AM  Result Value Ref Range   Sodium 137 135 - 145 mmol/L   Potassium 3.6 3.5 - 5.1 mmol/L   Chloride 102 101 - 111 mmol/L   CO2 24 22 - 32 mmol/L   Glucose, Bld 85 65 - 99 mg/dL   BUN 6 6 - 20 mg/dL   Creatinine, Ser 0.57 0.44 - 1.00 mg/dL   Calcium 8.8 (L) 8.9 - 10.3 mg/dL   GFR calc non Af Amer >60 >60 mL/min   GFR calc Af Amer >60 >60 mL/min    Comment: (NOTE) The eGFR has been calculated using the CKD EPI equation. This calculation has not been validated in all clinical situations. eGFR's persistently <60 mL/min signify possible Chronic Kidney Disease.    Anion gap 11 5 - 15  CBC WITH DIFFERENTIAL     Status: Abnormal   Collection Time: 01/30/17  5:06 AM  Result Value Ref Range   WBC 7.9 4.0 - 10.5 K/uL   RBC 4.58 3.87 - 5.11 MIL/uL   Hemoglobin 11.9 (L) 12.0 - 15.0 g/dL   HCT 36.3 36.0 - 46.0 %   MCV 79.3 78.0 - 100.0 fL   MCH 26.0 26.0 - 34.0 pg   MCHC 32.8 30.0 - 36.0 g/dL   RDW 18.4 (H) 11.5 - 15.5 %   Platelets 273 150 - 400 K/uL  Neutrophils Relative % 84 %   Neutro Abs 6.7 1.7 - 7.7 K/uL   Lymphocytes Relative 11 %   Lymphs Abs 0.9 0.7 - 4.0 K/uL   Monocytes Relative 3 %   Monocytes Absolute 0.2 0.1 - 1.0 K/uL   Eosinophils Relative 2 %   Eosinophils Absolute 0.1 0.0 - 0.7 K/uL   Basophils Relative 0 %   Basophils Absolute 0.0 0.0 - 0.1 K/uL  Magnesium     Status: None   Collection Time: 01/30/17  5:06 AM  Result Value Ref Range   Magnesium 2.2 1.7 - 2.4 mg/dL  Prealbumin     Status: None   Collection Time: 01/30/17  2:06 PM  Result Value Ref Range   Prealbumin 21.5 18 - 38 mg/dL     Comment: Performed at [facility], [place] [address], [place], [place] [address]  Basic metabolic panel     Status: Abnormal   Collection Time: 01/31/17  5:37 AM  Result Value Ref Range   Sodium 138 135 - 145 mmol/L   Potassium 4.0 3.5 - 5.1 mmol/L   Chloride 108 101 - 111 mmol/L   CO2 22 22 - 32 mmol/L   Glucose, Bld 93 65 - 99 mg/dL   BUN 9 6 - 20 mg/dL   Creatinine, Ser 0.62 0.44 - 1.00 mg/dL   Calcium 8.6 (L) 8.9 - 10.3 mg/dL   GFR calc non Af Amer >60 >60 mL/min   GFR calc Af Amer >60 >60 mL/min    Comment: (NOTE) The eGFR has been calculated using the CKD EPI equation. This calculation has not been validated in all clinical situations. eGFR's persistently <60 mL/min signify possible Chronic Kidney Disease.    Anion gap 8 5 - 15  CBC WITH DIFFERENTIAL     Status: Abnormal   Collection Time: 01/31/17  5:37 AM  Result Value Ref Range   WBC 6.4 4.0 - 10.5 K/uL   RBC 3.68 (L) 3.87 - 5.11 MIL/uL   Hemoglobin 9.6 (L) 12.0 - 15.0 g/dL   HCT 29.7 (L) 36.0 - 46.0 %   MCV 80.7 78.0 - 100.0 fL   MCH 26.1 26.0 - 34.0 pg   MCHC 32.3 30.0 - 36.0 g/dL   RDW 18.5 (H) 11.5 - 15.5 %   Platelets 251 150 - 400 K/uL   Neutrophils Relative % 71 %   Neutro Abs 4.5 1.7 - 7.7 K/uL   Lymphocytes Relative 17 %   Lymphs Abs 1.1 0.7 - 4.0 K/uL   Monocytes Relative 12 %   Monocytes Absolute 0.8 0.1 - 1.0 K/uL   Eosinophils Relative 0 %   Eosinophils Absolute 0.0 0.0 - 0.7 K/uL   Basophils Relative 0 %   Basophils Absolute 0.0 0.0 - 0.1 K/uL  Sedimentation rate     Status: Abnormal   Collection Time: 01/31/17  5:37 AM  Result Value Ref Range   Sed Rate 26 (H) 0 - 22 mm/hr  C-reactive protein     Status: Abnormal   Collection Time: 01/31/17  5:37 AM  Result Value Ref Range   CRP 1.0 (H) <1.0 mg/dL    Comment: Performed at [facility], [address], [place], [place] [address]  Type and screen [place]     Status: None   Collection Time: 01/31/17  7:22  AM  Result Value Ref Range   ABO/RH(D) B NEG    Antibody Screen NEG    Sample Expiration 02/03/2017  ABO/Rh     Status: None   Collection Time: 01/31/17  7:22 AM  Result Value Ref Range   ABO/RH(D) B NEG   Surgical PCR screen     Status: None   Collection Time: 01/31/17  8:09 AM  Result Value Ref Range   MRSA, PCR NEGATIVE NEGATIVE   Staphylococcus aureus NEGATIVE NEGATIVE    Comment:        The Xpert SA Assay (FDA approved for NASAL specimens in patients over 58 years of age), is one component of a comprehensive surveillance program.  Test performance has been validated by [facility] for patients greater than or equal to 59 year old. It is not intended to diagnose infection nor to guide or monitor treatment.     Imaging / Studies: Ct Abdomen Pelvis W Contrast  Result Date: 01/30/2017 CLINICAL DATA:  51 year old female with history of Crohn's presenting with generalized abdominal pain. EXAM: CT ABDOMEN AND PELVIS WITH CONTRAST TECHNIQUE: Multidetector CT imaging of the abdomen and pelvis was performed using the standard protocol following bolus administration of intravenous contrast. CONTRAST:  100 cc Isovue-300 COMPARISON:  CT dated 11/28/2016 FINDINGS: Lower chest: The visualized lung bases are clear. No intra-abdominal free air. There is moderate amount of free fluid within the posterior pelvis, increased compared to the prior CT. Hepatobiliary: Probable mild fatty infiltration of the liver. No intrahepatic biliary ductal dilatation. The gallbladder is unremarkable. Pancreas: Unremarkable. No pancreatic ductal dilatation or surrounding inflammatory changes. Spleen: Normal in size without focal abnormality. Adrenals/Urinary Tract: Mild bilateral pelviectasis, right greater left and similar or minimally increased compared to the prior CT. There is symmetric enhancement and excretion of contrast by both kidneys. The urinary bladder is distended. There is diffuse thickened bladder  wall likely related to inflammatory changes of the bowel within the pelvis. There is a focal area of loss of fat plane superior to the bladder with adhesion of the distal small bowel. There is a a focal area of mucosal retraction and beaking of the dome of the bladder (coronal series 4, image 45) which appears somewhat contiguous with a low attenuating channel which may represent a scarred or narrowed fistulous tract. Stomach/Bowel: Oral contrast opacifies the stomach and multiple loops of small bowel. There is no evidence of gastric outlet obstruction. There is inflammatory changes of the bowel within the pelvis with an area of tethering and adhesions of loops of small bowel (series 2, image 60) there is high grade narrowing of the terminal ileum in the right hemipelvis superior to the bladder with associated small-bowel obstruction. The distal small bowel loops measure up to approximately 4 cm in diameter. There is increase in the degree of obstruction and dilatation of the distal small bowel compared to the prior CT. Oral contrast terminates in the normal caliber distal small bowel within the pelvis and does not reach the terminal ileum or segments of dilated distal ileum. This may be related to timing of the contrast or obstruction related to adhesions. There is an approximately 10 cm strictured segment of the terminal ileum (series 4, image 44, and 56). There are multiple areas of strictures in the distal ileum. There is mucosal enhancement of the dilated loops of distal small bowel suggestive of recurrent or flare up of Crohn's disease. No drainable fluid collection or abscess identified. There is moderate stool throughout the colon. No inflammatory changes of the colon. Vascular/Lymphatic: There is moderate aortoiliac atherosclerotic disease. The origins of the celiac axis, SMA, IMA appear patent. The  SMV, splenic vein, and main portal vein are patent. No portal venous gas identified. There is no adenopathy.  Reproductive: Multiple mildly enlarged nabothian cysts noted. There is small amount of fluid within the endometrial canal. There is small amount of air within the vagina which may be introduced through the vaginal canal. However, an enterovaginal fistula is not entirely excluded. The ovaries are grossly unremarkable as visualized. Other: Mild diffuse subcutaneous stranding. Musculoskeletal: No acute or significant osseous findings. IMPRESSION: 1. Inflammatory changes of the distal small bowel with areas of adhesions and strictures and resulting obstruction. Overall there has been interval increase in the degree of obstruction compared to the prior study. There is mucosal enhancement of the dilated loops of small bowel suggestive of active disease or flare up of Crohn's. There is a approximately 10 cm stricture of the terminal ileum. No drainable fluid collection or abscess. 2. Adhesions of the distal small bowel to the bladder dome. A focal area retraction and beaking in the bladder dome appears to be contiguous with a narrow tract and may represent a scarred fistula. 3. Air within the vagina may be introduced from the vaginal canal. However, and enterovaginal fistula is not entirely excluded. Electronically Signed   By: Anner Crete M.D.   On: 01/30/2017 01:46    Medications / Allergies: per chart  Antibiotics: Anti-infectives    Start     Dose/Rate Route Frequency Ordered Stop   01/31/17 1107  cefoTEtan in Dextrose 5% (CEFOTAN) 2-2.08 GM-% IVPB  Status:  Discontinued    Comments:  Bridget Hartshorn   : cabinet override      01/31/17 1107 01/31/17 1122   01/31/17 0600  cefoTEtan (CEFOTAN) 2 g in dextrose 5 % 50 mL IVPB  Status:  Discontinued     2 g 100 mL/hr over 30 Minutes Intravenous On call to O.R. 01/30/17 1500 01/30/17 1504   01/31/17 0600  cefoTEtan (CEFOTAN) 2 g in dextrose 5 % 50 mL IVPB     2 g 100 mL/hr over 30 Minutes Intravenous On call to O.R. 01/30/17 1953 02/01/17 0559   01/31/17  0600  [MAR Hold]  clindamycin (CLEOCIN) 900 mg, gentamicin (GARAMYCIN) 240 mg in sodium chloride 0.9 % 1,000 mL for intraperitoneal lavage     (MAR Hold since 01/31/17 1046)    Intraperitoneal To Surgery 01/30/17 1522 02/01/17 0600   01/30/17 2200  [MAR Hold]  cefTRIAXone (ROCEPHIN) 1 g in dextrose 5 % 50 mL IVPB     (MAR Hold since 01/31/17 1046)   1 g 100 mL/hr over 30 Minutes Intravenous Every 24 hours 01/30/17 0332     01/30/17 1617  metroNIDAZOLE (FLAGYL) tablet 1,000 mg  Status:  Discontinued     1,000 mg Oral 3 times per day 01/30/17 1500 01/30/17 1530   01/30/17 1545  metroNIDAZOLE (FLAGYL) tablet 1,000 mg     1,000 mg Oral 3 times per day 01/30/17 1530 01/30/17 2306   01/30/17 1545  neomycin (MYCIFRADIN) tablet 1,000 mg     1,000 mg Oral 3 times per day 01/30/17 1530 01/30/17 2307   01/30/17 1530  neomycin (MYCIFRADIN) tablet 1,000 mg  Status:  Discontinued     1,000 mg Oral 3 times per day 01/30/17 1500 01/30/17 1530   01/30/17 1500  clindamycin (CLEOCIN) 900 mg, gentamicin (GARAMYCIN) 240 mg in sodium chloride 0.9 % 1,000 mL for intraperitoneal lavage  Status:  Discontinued     1 application Intraperitoneal To Surgery 01/30/17 1500 01/30/17 1522   01/30/17  0015  cefTRIAXone (ROCEPHIN) 1 g in dextrose 5 % 50 mL IVPB     1 g 100 mL/hr over 30 Minutes Intravenous  Once 01/30/17 0008 01/30/17 0150        Note: Portions of this report may have been transcribed using voice recognition software. Every effort was made to ensure accuracy; however, inadvertent computerized transcription errors may be present.   Any transcriptional errors that result from this process are unintentional.     Adin Hector, M.D., F.A.C.S. Gastrointestinal and Minimally Invasive Surgery [facility] [address], [place], [place] [address] [phone] Main / Paging   01/31/2017

## 2017-01-31 NOTE — Progress Notes (Signed)
Patient returned to 1528 from [place]. Honeycomb dressing to abdomen is dry and intact.  3 lap sites with gauze dry and intact to left abdomen.  Patient states pain is 10/10. Will give pain medicine when available.  See flowsheet for full assessment

## 2017-01-31 NOTE — Transfer of Care (Signed)
Immediate Anesthesia Transfer of Care Note  Patient: Marissa Blankenship  Procedure(s) Performed: Procedure(s): LAPAROSCOPIC LYSIS OF ADHESIONS, ILEOCECECTOMY, STRICTUROPLASTY, BLADDER REPAIR, RIGHT SALPINGECTOMY (N/A)  Patient Location: PACU  Anesthesia Type:General  Level of Consciousness: awake, alert  and oriented  Airway & Oxygen Therapy: Patient Spontanous Breathing and Patient connected to face mask oxygen  Post-op Assessment: Report given to RN and Post -op Vital signs reviewed and stable  Post vital signs: Reviewed and stable  Last Vitals:  Vitals:   01/31/17 1009 01/31/17 1837  BP: 127/80 (!) 167/103  Pulse: (!) 57 72  Resp: 16 20  Temp: 36.9 C     Last Pain:  Vitals:   01/31/17 1009  TempSrc: Oral  PainSc:          Complications: No apparent anesthesia complications

## 2017-01-31 NOTE — Progress Notes (Signed)
[facility] Progress Note  Subjective:  Going to surgery this morning with Dr. Johney Maine for resection.  Feels ok.  Passing flatus.  Objective:  Vital signs in last 24 hours: Temp:  [98.3 F (36.8 C)-98.4 F (36.9 C)] 98.3 F (36.8 C) (07/17 0620) Pulse Rate:  [54-61] 54 (07/17 0620) Resp:  [16] 16 (07/17 0620) BP: (136)/(84-89) 136/89 (07/17 0620) SpO2:  [100 %] 100 % (07/17 0620) Last BM Date: 01/29/17 General:  Alert, Well-developed, in NAD Heart:  Slightly bradycardic; no murmurs Pulm:  CTAB.  No increased WOB. Abdomen:  Soft, non-distended.  BS present.  Mild TTP.  Extremities:  Without edema. Neurologic:  Alert and oriented x 4;  grossly normal neurologically. Psych:  Alert and cooperative. Normal mood and affect.  Intake/Output from previous day: 07/16 0701 - 07/17 0700 In: 2426.7 [I.V.:2376.7; IV Piggyback:50] Out: 1500 [Urine:1500]  Lab Results:  Recent Labs  01/29/17 2004 01/30/17 0506 01/31/17 0537  WBC 7.8 7.9 6.4  HGB 11.2* 11.9* 9.6*  HCT 34.9* 36.3 29.7*  PLT 284 273 251   BMET  Recent Labs  01/29/17 2004 01/30/17 0506 01/31/17 0537  NA 136 137 138  K 3.6 3.6 4.0  CL 102 102 108  CO2 25 24 22   GLUCOSE 98 85 93  BUN 8 6 9   CREATININE 0.64 0.57 0.62  CALCIUM 8.7* 8.8* 8.6*   LFT  Recent Labs  01/29/17 2004  PROT 6.9  ALBUMIN 3.4*  AST 15  ALT 13*  ALKPHOS 62  BILITOT 0.3   Ct Abdomen Pelvis W Contrast  Result Date: 01/30/2017 CLINICAL DATA:  51 year old female with history of Crohn's presenting with generalized abdominal pain. EXAM: CT ABDOMEN AND PELVIS WITH CONTRAST TECHNIQUE: Multidetector CT imaging of the abdomen and pelvis was performed using the standard protocol following bolus administration of intravenous contrast. CONTRAST:  100 cc Isovue-300 COMPARISON:  CT dated 11/28/2016 FINDINGS: Lower chest: The visualized lung bases are clear. No intra-abdominal free air. There is moderate amount of free fluid within  the posterior pelvis, increased compared to the prior CT. Hepatobiliary: Probable mild fatty infiltration of the liver. No intrahepatic biliary ductal dilatation. The gallbladder is unremarkable. Pancreas: Unremarkable. No pancreatic ductal dilatation or surrounding inflammatory changes. Spleen: Normal in size without focal abnormality. Adrenals/Urinary Tract: Mild bilateral pelviectasis, right greater left and similar or minimally increased compared to the prior CT. There is symmetric enhancement and excretion of contrast by both kidneys. The urinary bladder is distended. There is diffuse thickened bladder wall likely related to inflammatory changes of the bowel within the pelvis. There is a focal area of loss of fat plane superior to the bladder with adhesion of the distal small bowel. There is a a focal area of mucosal retraction and beaking of the dome of the bladder (coronal series 4, image 45) which appears somewhat contiguous with a low attenuating channel which may represent a scarred or narrowed fistulous tract. Stomach/Bowel: Oral contrast opacifies the stomach and multiple loops of small bowel. There is no evidence of gastric outlet obstruction. There is inflammatory changes of the bowel within the pelvis with an area of tethering and adhesions of loops of small bowel (series 2, image 60) there is high grade narrowing of the terminal ileum in the right hemipelvis superior to the bladder with associated small-bowel obstruction. The distal small bowel loops measure up to approximately 4 cm in diameter. There is increase in the degree of obstruction and dilatation of the distal small bowel  compared to the prior CT. Oral contrast terminates in the normal caliber distal small bowel within the pelvis and does not reach the terminal ileum or segments of dilated distal ileum. This may be related to timing of the contrast or obstruction related to adhesions. There is an approximately 10 cm strictured segment of  the terminal ileum (series 4, image 44, and 56). There are multiple areas of strictures in the distal ileum. There is mucosal enhancement of the dilated loops of distal small bowel suggestive of recurrent or flare up of Crohn's disease. No drainable fluid collection or abscess identified. There is moderate stool throughout the colon. No inflammatory changes of the colon. Vascular/Lymphatic: There is moderate aortoiliac atherosclerotic disease. The origins of the celiac axis, SMA, IMA appear patent. The SMV, splenic vein, and main portal vein are patent. No portal venous gas identified. There is no adenopathy. Reproductive: Multiple mildly enlarged nabothian cysts noted. There is small amount of fluid within the endometrial canal. There is small amount of air within the vagina which may be introduced through the vaginal canal. However, an enterovaginal fistula is not entirely excluded. The ovaries are grossly unremarkable as visualized. Other: Mild diffuse subcutaneous stranding. Musculoskeletal: No acute or significant osseous findings. IMPRESSION: 1. Inflammatory changes of the distal small bowel with areas of adhesions and strictures and resulting obstruction. Overall there has been interval increase in the degree of obstruction compared to the prior study. There is mucosal enhancement of the dilated loops of small bowel suggestive of active disease or flare up of Crohn's. There is a approximately 10 cm stricture of the terminal ileum. No drainable fluid collection or abscess. 2. Adhesions of the distal small bowel to the bladder dome. A focal area retraction and beaking in the bladder dome appears to be contiguous with a narrow tract and may represent a scarred fistula. 3. Air within the vagina may be introduced from the vaginal canal. However, and enterovaginal fistula is not entirely excluded. Electronically Signed   By: Anner Crete M.D.   On: 01/30/2017 01:46   Assessment / Plan: 1. Small bowel Crohn's  exacerbation with SBO:  Patient is a primary nonresponder to anti-TNF therapy, patient has been on Entyvio since around December 2017, has continued to require prednisone, now with small bowel obstruction, patient was previously scheduled for surgery as an outpatient in early August, but is going to surgery today with Dr. Johney Maine. 2. Abnormal CT with possible enterovaginal fistula?  Plan:  1. Continue IV Solumedrol for now 65m qd for now. 2. Continue other supportive measures including antiemetics and pain medicine. 3. For surgery today.   LOS: 1 day   Marissa Blankenship D.  01/31/2017, 8:57 AM  Pager number [phone]

## 2017-01-31 NOTE — Progress Notes (Addendum)
Paged Dr. Hassell Done concerning orders for one bottle of ensure prior to OR and no bowel prep orders for patient. Dr. Hassell Done stated he looked over orders and follow instructions for pre-op ensure and no bowel prep.

## 2017-01-31 NOTE — Progress Notes (Signed)
PROGRESS NOTE                                                                                                                                                                                                             Patient Demographics:    Marissa Blankenship, is a 51 y.o. female, DOB - 05/10/1966, XUX:[id number]  Admit date - 01/29/2017   Admitting Physician Reubin Milan, MD  Outpatient Primary MD for the patient is Kerin Perna, NP  LOS - 1  Outpatient Specialists: [facility]  Chief Complaint  Patient presents with  . Abdominal Pain  . Emesis       Brief Narrative   51 year old female with anxiety, depression, hypertension and unresolved Crohn's disease who was referred to Dr. Marcello Moores for surgical resection (planned in early August after she completed her biological therapy) presented to the ED with 2 weeks of off-and-on abdominal pain associated with nausea, worsening abdominal pain with mild distention, poor by mouth intake and 2 episodes of vomiting. She reported being constipated. Her last dose of entyvio was 6 weeks back. Reports being compliant with her prednisone. Denies any fevers or chills, hematemesis or melena. In the ED vitals were stable. Had normal CBC and chemistry. CT of the abdomen and pelvis showed inflammatory changes in the distal small bowel with areas of adhesions or stricture causing obstruction. CT findings also concerning for enterovaginal fistula.    Subjective:   Denies abdominal pain today and passing flatus.   Assessment  & Plan :    Principal Problem:   Exacerbation of Crohn's disease with intestinal obstruction ([place]) Failed medical therapy with frequent small bowel obstruction. She did not respond to Humira and has been on biological for over 6 months without significant outcome. Has not responded to multiple courses of steroids as well. Her gastroenterologist was  also considering enrolling her into study at higher centers for alternating medications. -GI and surgery following. Went to OR today for surgical resection.  Have requested surgery to take over as primary team and hospitalist can follow along as consult if and when needed, since no current active medical issues being addressed.   Active Problems:   Essential hypertension Stable. Monitor on when necessary IV hydralazine.  GERD IV PPI daily    Lower urinary tract infectious disease On empiric Rocephin. Urine  culture growing gram-negative rods. Antibiotic and be narrowed down based on sensitivity (treat for total 5-days)  ? Enterovaginal fistula. Patient does report some vaginal discharge. Further recommendations for surgery.    Code Status : Full code  Family Communication  : Sister at bedside  Disposition Plan  : Pending postoperative hospital course  Barriers For Discharge : Active symptoms  Consults  :   [place] surgery  Procedures  :  CT Abdomen  DVT Prophylaxis  :  Lovenox -   Lab Results  Component Value Date   PLT 251 01/31/2017    Antibiotics  :    Anti-infectives    Start     Dose/Rate Route Frequency Ordered Stop   01/31/17 1107  cefoTEtan in Dextrose 5% (CEFOTAN) 2-2.08 GM-% IVPB  Status:  Discontinued    Comments:  Bridget Hartshorn   : cabinet override      01/31/17 1107 01/31/17 1122   01/31/17 0600  cefoTEtan (CEFOTAN) 2 g in dextrose 5 % 50 mL IVPB  Status:  Discontinued     2 g 100 mL/hr over 30 Minutes Intravenous On call to O.R. 01/30/17 1500 01/30/17 1504   01/31/17 0600  cefoTEtan (CEFOTAN) 2 g in dextrose 5 % 50 mL IVPB     2 g 100 mL/hr over 30 Minutes Intravenous On call to O.R. 01/30/17 1953 02/01/17 0559   01/31/17 0600  [MAR Hold]  clindamycin (CLEOCIN) 900 mg, gentamicin (GARAMYCIN) 240 mg in sodium chloride 0.9 % 1,000 mL for intraperitoneal lavage     (MAR Hold since 01/31/17 1046)    Intraperitoneal To Surgery 01/30/17  1522 02/01/17 0600   01/30/17 2200  [MAR Hold]  cefTRIAXone (ROCEPHIN) 1 g in dextrose 5 % 50 mL IVPB     (MAR Hold since 01/31/17 1046)   1 g 100 mL/hr over 30 Minutes Intravenous Every 24 hours 01/30/17 0332     01/30/17 1617  metroNIDAZOLE (FLAGYL) tablet 1,000 mg  Status:  Discontinued     1,000 mg Oral 3 times per day 01/30/17 1500 01/30/17 1530   01/30/17 1545  metroNIDAZOLE (FLAGYL) tablet 1,000 mg     1,000 mg Oral 3 times per day 01/30/17 1530 01/30/17 2306   01/30/17 1545  neomycin (MYCIFRADIN) tablet 1,000 mg     1,000 mg Oral 3 times per day 01/30/17 1530 01/30/17 2307   01/30/17 1530  neomycin (MYCIFRADIN) tablet 1,000 mg  Status:  Discontinued     1,000 mg Oral 3 times per day 01/30/17 1500 01/30/17 1530   01/30/17 1500  clindamycin (CLEOCIN) 900 mg, gentamicin (GARAMYCIN) 240 mg in sodium chloride 0.9 % 1,000 mL for intraperitoneal lavage  Status:  Discontinued     1 application Intraperitoneal To Surgery 01/30/17 1500 01/30/17 1522   01/30/17 0015  cefTRIAXone (ROCEPHIN) 1 g in dextrose 5 % 50 mL IVPB     1 g 100 mL/hr over 30 Minutes Intravenous  Once 01/30/17 0008 01/30/17 0150        Objective:   Vitals:   01/30/17 0536 01/30/17 2049 01/31/17 0620 01/31/17 1009  BP: 116/73 136/84 136/89 127/80  Pulse: (!) 50 61 (!) 54 (!) 57  Resp: 15 16 16 16   Temp: 97.9 F (36.6 C) 98.4 F (36.9 C) 98.3 F (36.8 C) 98.4 F (36.9 C)  TempSrc: Oral Oral Oral Oral  SpO2: 100% 100% 100% 100%  Weight:      Height:        Wt Readings from  Last 3 Encounters:  01/30/17 58.4 kg (128 lb 12 oz)  12/22/16 57.2 kg (126 lb)  11/22/16 57.8 kg (127 lb 8 oz)     Intake/Output Summary (Last 24 hours) at 01/31/17 1125 Last data filed at 01/31/17 0600  Gross per 24 hour  Intake          2426.67 ml  Output              700 ml  Net          1726.67 ml     Physical Exam  Gen: not in distress HEENT: Dry oral mucosa supple neck Chest: clear b/l, no added sounds CVS: N S1&S2, no  murmurs, rubs or gallop GI: soft, nondistended, non tender tenderness, bowel sounds present Musculoskeletal: warm, no edema     Data Review:    CBC  Recent Labs Lab 01/29/17 2004 01/30/17 0506 01/31/17 0537  WBC 7.8 7.9 6.4  HGB 11.2* 11.9* 9.6*  HCT 34.9* 36.3 29.7*  PLT 284 273 251  MCV 78.6 79.3 80.7  MCH 25.2* 26.0 26.1  MCHC 32.1 32.8 32.3  RDW 18.2* 18.4* 18.5*  LYMPHSABS  --  0.9 1.1  MONOABS  --  0.2 0.8  EOSABS  --  0.1 0.0  BASOSABS  --  0.0 0.0    Chemistries   Recent Labs Lab 01/29/17 2004 01/30/17 0506 01/31/17 0537  NA 136 137 138  K 3.6 3.6 4.0  CL 102 102 108  CO2 25 24 22   GLUCOSE 98 85 93  BUN 8 6 9   CREATININE 0.64 0.57 0.62  CALCIUM 8.7* 8.8* 8.6*  MG  --  2.2  --   AST 15  --   --   ALT 13*  --   --   ALKPHOS 62  --   --   BILITOT 0.3  --   --    ------------------------------------------------------------------------------------------------------------------ No results for input(s): CHOL, HDL, LDLCALC, TRIG, CHOLHDL, LDLDIRECT in the last 72 hours.  Lab Results  Component Value Date   HGBA1C 5.7 (H) 12/01/2015   ------------------------------------------------------------------------------------------------------------------ No results for input(s): TSH, T4TOTAL, T3FREE, THYROIDAB in the last 72 hours.  Invalid input(s): FREET3 ------------------------------------------------------------------------------------------------------------------ No results for input(s): VITAMINB12, FOLATE, FERRITIN, TIBC, IRON, RETICCTPCT in the last 72 hours.  Coagulation profile No results for input(s): INR, PROTIME in the last 168 hours.  No results for input(s): DDIMER in the last 72 hours.  Cardiac Enzymes No results for input(s): CKMB, TROPONINI, MYOGLOBIN in the last 168 hours.  Invalid input(s): CK ------------------------------------------------------------------------------------------------------------------ No results found for:  BNP  Inpatient Medications  Scheduled Meds: . [MAR Hold] bupivacaine liposome  20 mL Infiltration On Call to OR  . [MAR Hold] clindamycin / gentamicin INTRAPERITONEAL Lavage irrigation   Intraperitoneal To OR  . [MAR Hold] enoxaparin (LOVENOX) injection  40 mg Subcutaneous Q24H  . [MAR Hold] feeding supplement  237 mL Oral BID BM  . [MAR Hold] mouth rinse  15 mL Mouth Rinse BID  . [MAR Hold] methylPREDNISolone sodium succinate  60 mg Intravenous Daily  . [MAR Hold] pantoprazole (PROTONIX) IV  40 mg Intravenous QHS   Continuous Infusions: . 0.9 % NaCl with KCl 20 mEq / L Stopped (01/31/17 1103)  . cefoTEtan (CEFOTAN) IV    . [MAR Hold] cefTRIAXone (ROCEPHIN)  IV Stopped (01/30/17 2337)  . lactated ringers 50 mL/hr at 01/31/17 1105  . lactated ringers 50 mL/hr at 01/31/17 1105   PRN Meds:.[MAR Hold]  morphine injection, [MAR Hold] ondansetron (  ZOFRAN) IV  Micro Results Recent Results (from the past 240 hour(s))  Urine culture     Status: Abnormal (Preliminary result)   Collection Time: 01/29/17  8:03 PM  Result Value Ref Range Status   Specimen Description URINE, CLEAN CATCH  Final   Special Requests NONE  Final   Culture >=100,000 COLONIES/mL GRAM NEGATIVE RODS (A)  Final   Report Status PENDING  Incomplete    Radiology Reports Ct Abdomen Pelvis W Contrast  Result Date: 01/30/2017 CLINICAL DATA:  51 year old female with history of Crohn's presenting with generalized abdominal pain. EXAM: CT ABDOMEN AND PELVIS WITH CONTRAST TECHNIQUE: Multidetector CT imaging of the abdomen and pelvis was performed using the standard protocol following bolus administration of intravenous contrast. CONTRAST:  100 cc Isovue-300 COMPARISON:  CT dated 11/28/2016 FINDINGS: Lower chest: The visualized lung bases are clear. No intra-abdominal free air. There is moderate amount of free fluid within the posterior pelvis, increased compared to the prior CT. Hepatobiliary: Probable mild fatty infiltration of  the liver. No intrahepatic biliary ductal dilatation. The gallbladder is unremarkable. Pancreas: Unremarkable. No pancreatic ductal dilatation or surrounding inflammatory changes. Spleen: Normal in size without focal abnormality. Adrenals/Urinary Tract: Mild bilateral pelviectasis, right greater left and similar or minimally increased compared to the prior CT. There is symmetric enhancement and excretion of contrast by both kidneys. The urinary bladder is distended. There is diffuse thickened bladder wall likely related to inflammatory changes of the bowel within the pelvis. There is a focal area of loss of fat plane superior to the bladder with adhesion of the distal small bowel. There is a a focal area of mucosal retraction and beaking of the dome of the bladder (coronal series 4, image 45) which appears somewhat contiguous with a low attenuating channel which may represent a scarred or narrowed fistulous tract. Stomach/Bowel: Oral contrast opacifies the stomach and multiple loops of small bowel. There is no evidence of gastric outlet obstruction. There is inflammatory changes of the bowel within the pelvis with an area of tethering and adhesions of loops of small bowel (series 2, image 60) there is high grade narrowing of the terminal ileum in the right hemipelvis superior to the bladder with associated small-bowel obstruction. The distal small bowel loops measure up to approximately 4 cm in diameter. There is increase in the degree of obstruction and dilatation of the distal small bowel compared to the prior CT. Oral contrast terminates in the normal caliber distal small bowel within the pelvis and does not reach the terminal ileum or segments of dilated distal ileum. This may be related to timing of the contrast or obstruction related to adhesions. There is an approximately 10 cm strictured segment of the terminal ileum (series 4, image 44, and 56). There are multiple areas of strictures in the distal ileum.  There is mucosal enhancement of the dilated loops of distal small bowel suggestive of recurrent or flare up of Crohn's disease. No drainable fluid collection or abscess identified. There is moderate stool throughout the colon. No inflammatory changes of the colon. Vascular/Lymphatic: There is moderate aortoiliac atherosclerotic disease. The origins of the celiac axis, SMA, IMA appear patent. The SMV, splenic vein, and main portal vein are patent. No portal venous gas identified. There is no adenopathy. Reproductive: Multiple mildly enlarged nabothian cysts noted. There is small amount of fluid within the endometrial canal. There is small amount of air within the vagina which may be introduced through the vaginal canal. However, an enterovaginal fistula is not  entirely excluded. The ovaries are grossly unremarkable as visualized. Other: Mild diffuse subcutaneous stranding. Musculoskeletal: No acute or significant osseous findings. IMPRESSION: 1. Inflammatory changes of the distal small bowel with areas of adhesions and strictures and resulting obstruction. Overall there has been interval increase in the degree of obstruction compared to the prior study. There is mucosal enhancement of the dilated loops of small bowel suggestive of active disease or flare up of Crohn's. There is a approximately 10 cm stricture of the terminal ileum. No drainable fluid collection or abscess. 2. Adhesions of the distal small bowel to the bladder dome. A focal area retraction and beaking in the bladder dome appears to be contiguous with a narrow tract and may represent a scarred fistula. 3. Air within the vagina may be introduced from the vaginal canal. However, and enterovaginal fistula is not entirely excluded. Electronically Signed   By: Anner Crete M.D.   On: 01/30/2017 01:46    Time Spent in minutes  25   Louellen Molder M.D on 01/31/2017 at 11:25 AM  Between 7am to 7pm - Pager - [phone]  After 7pm go to  [email or web address] - password [facility]  [facility] -  Office  [phone]

## 2017-01-31 NOTE — Op Note (Signed)
01/31/2017  6:26 PM  PATIENT:  Marissa Blankenship  51 y.o. female  Patient Care Team: Kerin Perna, NP as PCP - General (Internal Medicine) Leighton Ruff, MD as Consulting Physician (General Surgery) Danis, Kirke Corin, MD as Consulting Physician (Gastroenterology)  PRE-OPERATIVE DIAGNOSIS:  CHRONS DISEASE  POST-OPERATIVE DIAGNOSIS:  HX OF CHRONS DISEASE  PROCEDURE:  Procedure(s): LAPAROSCOPIC LYSIS OF ADHESIONS, ILEOCECECTOMY, STRICTUROPLASTY, BLADDER REPAIR, RIGHT SALPINGECTOMY  SURGEON:  Adin Hector, MD  ASSISTANT: RNFA   ANESTHESIA:   local and general  EBL:  Total I/O In: 950 [I.V.:950] Out: 9147 [Urine:1050]  Delay start of Pharmacological VTE agent (>24hrs) due to surgical blood loss or risk of bleeding:  no  DRAINS: none   SPECIMEN:  Source of Specimen:  ILEOCECTOMY with contiguous right fallopian tube  DISPOSITION OF SPECIMEN:  PATHOLOGY  COUNTS:  YES  PLAN OF CARE: Admit to inpatient   PATIENT DISPOSITION:  PACU - hemodynamically stable.  INDICATION:    Patient with Crohn's disease depended on immunosuppression and Biologics was still episodes of obstructive symptoms and chronic ileocecal stricture.  Intermittent episodes of bacterial vaginosis and urinary tract infection suspicious for possible fistula to bladder or vagina.  Comes in not even able to tolerate liquids.  I recommended segmental resection:  The anatomy & physiology of the digestive tract was discussed.  The pathophysiology was discussed.  Natural history risks without surgery was discussed.   I worked to give an overview of the disease and the frequent need to have multispecialty involvement.  I feel the risks of no intervention will lead to serious problems that outweigh the operative risks; therefore, I recommended a partial colectomy to remove the pathology.  Laparoscopic & open techniques were discussed.   Risks such as bleeding, infection, abscess, leak, reoperation, possible ostomy,  hernia, heart attack, death, and other risks were discussed.  I noted a good likelihood this will help address the problem.   Goals of post-operative recovery were discussed as well.  We will work to minimize complications.  An educational handout on the pathology was given as well.  Questions were answered.    The patient expresses understanding & wishes to proceed with surgery.  OR FINDINGS:   Patient had very thickened ileocecal region densely adherent to the dome of the bladder.  Thickened inflammation to the bladder but no full breech into it.  Suspicious for prior colovesical fistula.  No evidence of any colovaginal fistula.  Stricture in mid ileum nearly causing fistula to ileocecal region.  Stricturoplasty done  No obvious metastatic disease on visceral parietal peritoneum or liver.  It is an ileocolonic anastomosis (side-to-side stapled antiperistaltic) that rests in the right lower quadrant.  DESCRIPTION:   Informed consent was confirmed.  The patient underwent general anaesthesia without difficulty.  The patient was positioned with arms tucked & secured appropriately.  VTE prevention in place.  The patient's abdomen was clipped, prepped, & draped in a sterile fashion.  Surgical timeout confirmed our plan.  The patient was positioned in reverse Trendelenburg.  Abdominal entry was gained using optical entry technique in the left upper abdomen.  Entry was clean.  I induced carbon dioxide insufflation.  Camera inspection revealed no injury.  Extra ports were carefully placed under direct laparoscopic visualization.  Patient had some adhesions the anterior bowel wall and freed off.  Had very dilated ileum.  [name] off a few interloop adhesions.  There was a dense segment of adhesions of loops of ileum and especially ileocecal region  to the dome of the bladder.  Carefully freed this soft and cold scissors sharp dissection.  Was very thickened and concrete it.  The adnexa had wispy adhesions  to it and were able to freed off.  The right fallopian tube was densely adherent into it and cannot be spared.  Therefore isolated and transected it off the ovary.  Eventually sharp dissection was able to mobilize it in a lateral to medial fashion.  A little, from the less inflamed proximal ileal mesentery and help free it off the enlarged uterus until just the dome bladder remained.  There was a knuckle of small intestine densely adherent to this area the ileum that I freed off.  Had some thinning of cirrhosis suspicious for a ileoileal fistula.  Eventually can chisel off the ileocecal region off the bladder and pelvis until was mobilized and the upper abdomen.  I continued to mobilize the terminal ileum & proximal "right" colon in a lateral to medial fashion.  I mobilized the distal ileal mesentery off its retroperitoneal and pelvic attachments.  I was able to see the right ureter deep in the retroperitoneal sleeve away from the inflammation fortunately.  I mobilized the ascending colon off It is side wall attachments to the paracolic gutter and retroperitoneum.  I mobilized the proximal transverse colon in a superior to inferior fashion and freed adhesions of omentum and mesocolon off the duodenal bulb and gallbladder for greater mobility.  I placed a GelPort has a wound protector through a supraumbilical transverse incision opened in a Pfannenstiel fashion.  With that, I was able to eviserate the ileum and proximal colon.  I could isolate the pathology. When he went ahead and proceeded with transection.  I created a window in the distal ileum just proximal to a terminal ileal tight hairpin full densely adherent to it suspicious for another ileoileal fistula.  I was able to create a window in the mesentery of the proximal ascending colon as well.  I took the mesentery with clamps and cautery.  Ligated the mesentery with 2-0 Vicryl figure-of-eight suture rings and ties.  Avoided using silk.  I did a side-to-side  staple anastomosis of ileum to proximal ascending using a 19m GIA stapler.  We then transected the specimen off (including the common bowel defect) using a TX-90 stapler.  I closed off the common mesenteric defect using interrupted vicryl stitches to avoid any internal hernias.    Ran the small bowel proximally and a noted the scarred tight fold consistent with a narrow ileal stricture.  He was significantly narrowed with serosal tear near the region.  I decided to do a stricturoplasty.  I opened up at this region 4 cm on the antimesenteric side in a longitudinal fashion.  I then closed the wound transversely with interrupted 2-0 Vicryl sutures to have a nice open broad stricturoplasty to good result.  I then ran the bowel from the ileocecal region the ligament of Treitz.  Proximal ileum and jejunum was decompressed and not inflamed.  No evidence of any other strictures proximally.  Allowed to return the abdomen.  We did reinspection of the abdomen.  Hemostasis was good.   Ureters, retroperitoneum, and bowel uninjured.  The anastomosis looked healthy.   We did a final irrigation of antibiotic solution (900 mg clindamycin/240 mg gentamicin in a liter of crystalloid) & held that for 10 minutes while we removed the wound protector of the Gelport & changed gown & gloves. The patient was re-draped.  Sterile unused  instruments were used from this point out per colon SSI prevention protocol.       We reinspected the abdomen.  Hemostasis was good.   No injury.  The anastomosis looked healthy.  I closed the 73m port sites using Monocryl stitch and sterile dressing.  I closed the   Periumbilical incision using 0 Vicryl suture to close the posterior rectus fascia and peritoneum vertically.  I then closed the anterior rectus fascia transversely using #1 PDS running closure. I closed the skin with some interrupted Monocryl stitches. I placed antibiotic-soaked wicks in between those areas. I placed sterile dressing.   .  Patient is being extubated go to recovery room. I discussed postop care with the patient in detail the office & in the holding area. Instructions are written.  I am about to find an update the patient's status to the family.    SAdin Hector M.D., F.A.C.S. [facility] [address] [place] [phone] / Paging

## 2017-01-31 NOTE — Addendum Note (Signed)
Addendum  created 01/31/17 2047 by Effie Berkshire, MD   Sign clinical note

## 2017-01-31 NOTE — Anesthesia Procedure Notes (Signed)
Procedure Name: Intubation Date/Time: 01/31/2017 3:03 PM Performed by: Noralyn Pick D Pre-anesthesia Checklist: Patient identified, Emergency Drugs available, Suction available and Patient being monitored Patient Re-evaluated:Patient Re-evaluated prior to induction Oxygen Delivery Method: Circle system utilized Preoxygenation: Pre-oxygenation with 100% oxygen Induction Type: IV induction Ventilation: Mask ventilation without difficulty Laryngoscope Size: Mac and 3 Grade View: Grade II Tube type: Oral Tube size: 7.5 mm Number of attempts: 1 Airway Equipment and Method: Stylet Placement Confirmation: ETT inserted through vocal cords under direct vision,  positive ETCO2 and breath sounds checked- equal and bilateral Secured at: 22 cm Tube secured with: Tape Dental Injury: Teeth and Oropharynx as per pre-operative assessment

## 2017-02-01 ENCOUNTER — Encounter (HOSPITAL_COMMUNITY): Payer: Self-pay | Admitting: Surgery

## 2017-02-01 DIAGNOSIS — K50012 Crohn's disease of small intestine with intestinal obstruction: Principal | ICD-10-CM

## 2017-02-01 DIAGNOSIS — D72829 Elevated white blood cell count, unspecified: Secondary | ICD-10-CM

## 2017-02-01 DIAGNOSIS — K219 Gastro-esophageal reflux disease without esophagitis: Secondary | ICD-10-CM

## 2017-02-01 DIAGNOSIS — B9620 Unspecified Escherichia coli [E. coli] as the cause of diseases classified elsewhere: Secondary | ICD-10-CM

## 2017-02-01 DIAGNOSIS — R638 Other symptoms and signs concerning food and fluid intake: Secondary | ICD-10-CM

## 2017-02-01 DIAGNOSIS — I10 Essential (primary) hypertension: Secondary | ICD-10-CM

## 2017-02-01 DIAGNOSIS — N390 Urinary tract infection, site not specified: Secondary | ICD-10-CM

## 2017-02-01 DIAGNOSIS — D509 Iron deficiency anemia, unspecified: Secondary | ICD-10-CM

## 2017-02-01 LAB — CBC
HCT: 35.8 % — ABNORMAL LOW (ref 36.0–46.0)
Hemoglobin: 11.6 g/dL — ABNORMAL LOW (ref 12.0–15.0)
MCH: 26 pg (ref 26.0–34.0)
MCHC: 32.4 g/dL (ref 30.0–36.0)
MCV: 80.3 fL (ref 78.0–100.0)
PLATELETS: 293 10*3/uL (ref 150–400)
RBC: 4.46 MIL/uL (ref 3.87–5.11)
RDW: 18.6 % — AB (ref 11.5–15.5)
WBC: 15.1 10*3/uL — AB (ref 4.0–10.5)

## 2017-02-01 LAB — URINE CULTURE

## 2017-02-01 LAB — BASIC METABOLIC PANEL
Anion gap: 10 (ref 5–15)
BUN: 5 mg/dL — AB (ref 6–20)
CALCIUM: 8.7 mg/dL — AB (ref 8.9–10.3)
CO2: 26 mmol/L (ref 22–32)
CREATININE: 0.66 mg/dL (ref 0.44–1.00)
Chloride: 97 mmol/L — ABNORMAL LOW (ref 101–111)
GFR calc Af Amer: >60 mL/min (ref >60)
GLUCOSE: 135 mg/dL — AB (ref 65–99)
Potassium: 4 mmol/L (ref 3.5–5.1)
Sodium: 133 mmol/L — ABNORMAL LOW (ref 135–145)

## 2017-02-01 LAB — HEMOGLOBIN A1C
HEMOGLOBIN A1C: 5.1 % (ref 4.8–5.6)
Mean Plasma Glucose: 100 mg/dL

## 2017-02-01 LAB — ABO/RH: ABO/RH(D): B NEG

## 2017-02-01 MED ORDER — ALUM & MAG HYDROXIDE-SIMETH 200-200-20 MG/5ML PO SUSP
30.0000 mL | Freq: Four times a day (QID) | ORAL | Status: DC | PRN
Start: 2017-02-01 — End: 2017-02-03

## 2017-02-01 MED ORDER — DEXTROSE 5 % IV SOLN
1.0000 g | INTRAVENOUS | Status: DC
  Administered 2017-02-01: 1 g via INTRAVENOUS
  Filled 2017-02-01 (×2): qty 10

## 2017-02-01 MED ORDER — OXYCODONE HCL 5 MG PO TABS
5.0000 mg | ORAL_TABLET | ORAL | Status: DC | PRN
  Administered 2017-02-01: 5 mg via ORAL
  Administered 2017-02-02 (×3): 10 mg via ORAL
  Filled 2017-02-01 (×3): qty 2
  Filled 2017-02-01: qty 1

## 2017-02-01 NOTE — Consult Note (Addendum)
WOC follow-up: Pt did not receive a colostomy during surgery yesterday; [place] team will not follow further. Please refer to surgical team for further plan of care. Please re-consult if further assistance is needed.  Thank-you,  Julien Girt MSN, [place], [place], [place], [place]

## 2017-02-01 NOTE — Consult Note (Signed)
Stopped by patient's room to make sure if she received colostomy during surgery yesterday.  Pt did not and therefore we will not follow but will remain available if needed.  Please re-consult if we need to assist further.  Fara Olden, RN-C, WTA-C, [facility] Wound Treatment Associate

## 2017-02-01 NOTE — Progress Notes (Signed)
PROGRESS NOTE    Marissa Blankenship  TGY:[id number] DOB: [birth year] DOA: 01/29/2017 PCP: Kerin Perna, NP  Brief Narrative:  The patient is a 51 year old female with anxiety, depression, hypertension and unresolved Crohn's disease who was referred to Dr. Marcello Moores for surgical resection (planned in early August after she completed her biological therapy) presented to the ED with 2 weeks of off-and-on abdominal pain associated with nausea, worsening abdominal pain with mild distention, poor by mouth intake and 2 episodes of vomiting. She reported being constipated. Her last dose of entyvio was 6 weeks back. Reports being compliant with her prednisone. Denies any fevers or chills, hematemesis or melena. In the ED vitals were stable. Had normal CBC and chemistry. CT of the abdomen and pelvis showed inflammatory changes in the distal small bowel with areas of adhesions or stricture causing obstruction. CT findings also concerning for enterovaginal fistula Gastroenterology and General Surgery were consulted and the patient was taken to the OR yesterday by Dr. Michael Boston for Laparoscopic lysis of adhesions, ileocecectomy, stricturoplasty, bladder repair and Right Salpingectomy. Patient is POD 1 and improving.   Assessment & Plan:   Principal Problem:   Exacerbation of Crohn's disease with intestinal obstruction status post ileal stricturoplasty and ileocecal resection 01/31/2017 Active Problems:   Essential hypertension   ACID REFLUX DISEASE   Lower urinary tract infectious disease   Inadequate oral intake   Protein-calorie malnutrition, severe   Anemia   Bradycardia  Exacerbation of Crohn's Disease with intestinal obstruction s/p Laparoscopic Lysis of Adhesions, Ileocecectomy, Stricturoplasty, Bladder Repair and Right Salpingectomy POD 1 -Failed medical therapy with frequent small bowel obstruction.  -She did not respond to Humira and has been on biological for over 6 months without significant  outcome.  -Has not responded to multiple courses of steroids as well. Her gastroenterologist was also considering enrolling her into study at higher centers for alternating medications. -GI and Surgery following. Went to OR yesterday for surgical resection and is POD 1. -Gastroenterology D/C'd IV steroids now and recommending supportive Measures including Antiemetics and Pain Medicine -Gastroenterology Dr. Loletha Carrow to arrange for future long-standing treatment -General Surgery Following and appreciate Recc's Recommend Clear Liquid Diet and advancement to Soft Diet if Tolerated -C/w Acetaminophen 1,000 mg po TID, Oxycodone 5-10 mg po q4hprn for Moderate Pain, Hydromorphone 0.5-2 mg IV q2hprn Moderate and Severe Pain -General Surgery started patient on Alvimopan 12 mg Post-Operatively -C/w Zofran 4 mg IV q6hprn N/V and Compazine 5-10 mg IV q4hprn for Refractory N/V -C/w Simethicone 80 mg po q6hprn Flatulence  -C/w Robaxin 1000 mg po q6hprn Muscle Spasms -C/w Cepacol and Phenol Mouth Spray for Sore Throat/Throat Irritation along with Magic MouthWash 15 mL po 4 Times Daily Prn -C/w IVF with NS + 20 mEQ of KCl 100 mL/hr -Further Management per General Surgery   Essential Hypertension -Stable. -Home Medications of Amlodipine 10 mg po Daily, Lisinopril 40 mg and Metoprolol 50 mg po Daily held and will need to be restarted when patient is taking po.  -C/w IV Enalaprilat 0.625-1.25 mg IV q6h SBP >200 or DBP >100 -C/w Hydralazine 5-20 mg IV q6hprn for SBP >180 or DBP >100 -C/w IV Metoprolol Tartrate 5 mg IV q6hprn for SBP>180 or for HR>110  GERD -Takes Pantoprazole 40 mg po Daily -C/w Pantoprazole 40 mg IV qHS  E Coli UTI  -Urine Culture showed >100,000 CFU of E. Coli -Was on Empiric Rocephin and received Cefotetan preoperatively.  -Has received 3 days of IV Abx and will restart IV Ceftriaxone -  Continue to Monitor   ? Enterovaginal fistula. -Patient did report some vaginal discharge.    -Operative findings showedthat the patient had very thickened ileocecal region densely adherent to the dome of the bladder.Thickened inflammation to the bladder but no full breech into it. Suspicious for prior colovesical fistula.  No evidence of any colovaginal fistula Stricture in mid ileum nearly causing fistula to ileocecal region.Stricturoplasty done -Further recommendations for Surgery.  Leukocytosis -Likely Reactive from Surgery -Patient's WBC went from 6.4 -> 15.1 -Continue to Monitor and Repeat CBC in AM  Normocytic Anemia/IDA -Patient's Hb/Hct went from 9.6/29.7 -> 11.6/35.8 -C/w Ferrous Sulfate 325 mg po BID -Continue to Monitor for S/Sx of Bleeding -Repeat CBC in AM  DVT prophylaxis: Enoxaparin 40 mg sq Daily Code Status: FULL CODE Family Communication: Discussed with Family at bedside Disposition Plan: [place] for now and D/C in 24-48 hours if medically stable  Consultants:   Gastroenterology  General Surgery  Procedures:  LAPAROSCOPIC LYSIS OF ADHESIONS, ILEOCECECTOMY, STRICTUROPLASTY, BLADDER REPAIR, RIGHT SALPINGECTOMY Done by Dr. Michael Boston 01/31/17   Antimicrobials: Anti-infectives    Start     Dose/Rate Route Frequency Ordered Stop   02/01/17 0300  cefoTEtan (CEFOTAN) 2 g in dextrose 5 % 50 mL IVPB     2 g 100 mL/hr over 30 Minutes Intravenous Every 12 hours 01/31/17 2020 02/01/17 0354   01/31/17 1737  clindamycin (CLEOCIN) 900 mg, gentamicin (GARAMYCIN) 240 mg in sodium chloride 0.9 % 1,000 mL for intraperitoneal lavage  Status:  Discontinued       As needed 01/31/17 1738 01/31/17 1833   01/31/17 1107  cefoTEtan in Dextrose 5% (CEFOTAN) 2-2.08 GM-% IVPB  Status:  Discontinued    Comments:  Bridget Hartshorn   : cabinet override      01/31/17 1107 01/31/17 1122   01/31/17 0600  cefoTEtan (CEFOTAN) 2 g in dextrose 5 % 50 mL IVPB  Status:  Discontinued     2 g 100 mL/hr over 30 Minutes Intravenous On call to O.R. 01/30/17 1500 01/30/17 1504    01/31/17 0600  cefoTEtan (CEFOTAN) 2 g in dextrose 5 % 50 mL IVPB     2 g 100 mL/hr over 30 Minutes Intravenous On call to O.R. 01/30/17 1953 01/31/17 1535   01/31/17 0600  clindamycin (CLEOCIN) 900 mg, gentamicin (GARAMYCIN) 240 mg in sodium chloride 0.9 % 1,000 mL for intraperitoneal lavage      Intraperitoneal To Surgery 01/30/17 1522 02/01/17 0600   01/30/17 2200  cefTRIAXone (ROCEPHIN) 1 g in dextrose 5 % 50 mL IVPB  Status:  Discontinued     1 g 100 mL/hr over 30 Minutes Intravenous Every 24 hours 01/30/17 0332 01/31/17 2020   01/30/17 1617  metroNIDAZOLE (FLAGYL) tablet 1,000 mg  Status:  Discontinued     1,000 mg Oral 3 times per day 01/30/17 1500 01/30/17 1530   01/30/17 1545  metroNIDAZOLE (FLAGYL) tablet 1,000 mg     1,000 mg Oral 3 times per day 01/30/17 1530 01/30/17 2306   01/30/17 1545  neomycin (MYCIFRADIN) tablet 1,000 mg     1,000 mg Oral 3 times per day 01/30/17 1530 01/30/17 2307   01/30/17 1530  neomycin (MYCIFRADIN) tablet 1,000 mg  Status:  Discontinued     1,000 mg Oral 3 times per day 01/30/17 1500 01/30/17 1530   01/30/17 1500  clindamycin (CLEOCIN) 900 mg, gentamicin (GARAMYCIN) 240 mg in sodium chloride 0.9 % 1,000 mL for intraperitoneal lavage  Status:  Discontinued  1 application Intraperitoneal To Surgery 01/30/17 1500 01/30/17 1522   01/30/17 0015  cefTRIAXone (ROCEPHIN) 1 g in dextrose 5 % 50 mL IVPB     1 g 100 mL/hr over 30 Minutes Intravenous  Once 01/30/17 0008 01/30/17 0150     Subjective: Seen and examined and stated pain was controlled with medication. No nausea or vomiting. Tolerating Clears so far. No CP or SOB. No other concerns or complaints at this time.   Objective: Vitals:   02/01/17 0713 02/01/17 1009 02/01/17 1051 02/01/17 1300  BP: 139/89 (!) 169/105 (!) 149/94 (!) 157/90  Pulse:  94  92  Resp:  18  18  Temp:  97.9 F (36.6 C)    TempSrc:  Oral  Oral  SpO2:  100%  99%  Weight:      Height:        Intake/Output Summary (Last  24 hours) at 02/01/17 1553 Last data filed at 02/01/17 1406  Gross per 24 hour  Intake             3855 ml  Output             4600 ml  Net             -745 ml   Filed Weights   01/30/17 0344  Weight: 58.4 kg (128 lb 12 oz)   Examination: Physical Exam:  Constitutional:  NAD and appears calm and comfortable and slightly drowsy Eyes: Lids and conjunctivae normal, sclerae anicteric  ENMT: External Ears, Nose appear normal. Grossly normal hearing. Mucous membranes are moist. Neck: Appears normal, supple, no cervical masses, normal ROM, no appreciable thyromegaly, no JVD Respiratory: Diminished to auscultation bilaterally, no wheezing, rales, rhonchi or crackles. Normal respiratory effort and patient is not tachypenic. No accessory muscle use.  Cardiovascular: RRR, no murmurs / rubs / gallops. S1 and S2 auscultated. No extremity edema.   Abdomen: Soft, tender to palpate, non-distended. No masses palpated. No appreciable hepatosplenomegaly. Bowel sounds positive but diminished. Abdominal Incisions appeared C/D/I and has Honeycomb dressing  GU: Deferred. Musculoskeletal: No clubbing / cyanosis of digits/nails. No joint deformity upper and lower extremities. Good ROM, no contractures. Normal strength and muscle tone.  Skin: No rashes, lesions, ulcers; Abdominal Incisions appeared C/D/I. No induration; Warm and dry.  Neurologic: CN 2-12 grossly intact with no focal deficits. Romberg sign cerebellar reflexes not assessed.  Psychiatric: Normal judgment and insight. Alert and oriented x 3. Normal mood and appropriate affect.   Data Reviewed: I have personally reviewed following labs and imaging studies  CBC:  Recent Labs Lab 01/29/17 2004 01/30/17 0506 01/31/17 0537 02/01/17 0523  WBC 7.8 7.9 6.4 15.1*  NEUTROABS  --  6.7 4.5  --   HGB 11.2* 11.9* 9.6* 11.6*  HCT 34.9* 36.3 29.7* 35.8*  MCV 78.6 79.3 80.7 80.3  PLT 284 273 251 161   Basic Metabolic Panel:  Recent Labs Lab  01/29/17 2004 01/30/17 0506 01/31/17 0537 02/01/17 0523  NA 136 137 138 133*  K 3.6 3.6 4.0 4.0  CL 102 102 108 97*  CO2 25 24 22 26   GLUCOSE 98 85 93 135*  BUN 8 6 9  5*  CREATININE 0.64 0.57 0.62 0.66  CALCIUM 8.7* 8.8* 8.6* 8.7*  MG  --  2.2  --   --    GFR: Estimated Creatinine Clearance: 73.5 mL/min (by C-G formula based on SCr of 0.66 mg/dL). Liver Function Tests:  Recent Labs Lab 01/29/17 2004  AST 15  ALT  13*  ALKPHOS 62  BILITOT 0.3  PROT 6.9  ALBUMIN 3.4*    Recent Labs Lab 01/29/17 2004  LIPASE 10*   No results for input(s): AMMONIA in the last 168 hours. Coagulation Profile: No results for input(s): INR, PROTIME in the last 168 hours. Cardiac Enzymes: No results for input(s): CKTOTAL, CKMB, CKMBINDEX, TROPONINI in the last 168 hours. BNP (last 3 results) No results for input(s): PROBNP in the last 8760 hours. HbA1C:  Recent Labs  01/31/17 0537  HGBA1C 5.1   CBG: No results for input(s): GLUCAP in the last 168 hours. Lipid Profile: No results for input(s): CHOL, HDL, LDLCALC, TRIG, CHOLHDL, LDLDIRECT in the last 72 hours. Thyroid Function Tests: No results for input(s): TSH, T4TOTAL, FREET4, T3FREE, THYROIDAB in the last 72 hours. Anemia Panel: No results for input(s): VITAMINB12, FOLATE, FERRITIN, TIBC, IRON, RETICCTPCT in the last 72 hours. Sepsis Labs: No results for input(s): PROCALCITON, LATICACIDVEN in the last 168 hours.  Recent Results (from the past 240 hour(s))  Urine culture     Status: Abnormal   Collection Time: 01/29/17  8:03 PM  Result Value Ref Range Status   Specimen Description URINE, CLEAN CATCH  Final   Special Requests NONE  Final   Culture >=100,000 COLONIES/mL ESCHERICHIA COLI (A)  Final   Report Status 02/01/2017 FINAL  Final   Organism ID, Bacteria ESCHERICHIA COLI (A)  Final      Susceptibility   Escherichia coli - MIC*    AMPICILLIN <=2 SENSITIVE Sensitive     CEFAZOLIN <=4 SENSITIVE Sensitive      CEFTRIAXONE <=1 SENSITIVE Sensitive     CIPROFLOXACIN <=0.25 SENSITIVE Sensitive     GENTAMICIN <=1 SENSITIVE Sensitive     IMIPENEM <=0.25 SENSITIVE Sensitive     NITROFURANTOIN <=16 SENSITIVE Sensitive     TRIMETH/SULFA <=20 SENSITIVE Sensitive     AMPICILLIN/SULBACTAM <=2 SENSITIVE Sensitive     PIP/TAZO <=4 SENSITIVE Sensitive     Extended ESBL NEGATIVE Sensitive     * >=100,000 COLONIES/mL ESCHERICHIA COLI  Surgical PCR screen     Status: None   Collection Time: 01/31/17  8:09 AM  Result Value Ref Range Status   MRSA, PCR NEGATIVE NEGATIVE Final   Staphylococcus aureus NEGATIVE NEGATIVE Final    Comment:        The Xpert SA Assay (FDA approved for NASAL specimens in patients over 73 years of age), is one component of a comprehensive surveillance program.  Test performance has been validated by [facility] for patients greater than or equal to 34 year old. It is not intended to diagnose infection nor to guide or monitor treatment.     Radiology Studies: No results found.   Scheduled Meds: . acetaminophen  1,000 mg Oral TID  . alvimopan  12 mg Oral BID  . enoxaparin (LOVENOX) injection  40 mg Subcutaneous Q24H  . feeding supplement  237 mL Oral BID BM  . ferrous sulfate  325 mg Oral BID WC  . gabapentin  300 mg Oral BID  . lip balm  1 application Topical BID  . mouth rinse  15 mL Mouth Rinse BID  . pantoprazole (PROTONIX) IV  40 mg Intravenous QHS  . psyllium  1 packet Oral Daily   Continuous Infusions: . 0.9 % NaCl with KCl 20 mEq / L 100 mL/hr at 02/01/17 1159  . lactated ringers    . methocarbamol (ROBAXIN)  IV      LOS: 2 days   Bertram Savin  Alfredia Ferguson, DO [facility] Pager [phone]  If 7PM-7AM, please contact night-coverage [email or web address] Password [facility] 02/01/2017, 3:53 PM

## 2017-02-01 NOTE — Progress Notes (Signed)
     [facility] Progress Note  Subjective:  Having a lot of pain today.  S/p ileocecectomy on 7/17 with Dr. Johney Maine.  Low-grade temp of 100.5 overnight.  Objective:  Vital signs in last 24 hours: Temp:  [97.4 F (36.3 C)-100.5 F (38.1 C)] 98.2 F (36.8 C) (07/18 0528) Pulse Rate:  [57-89] 59 (07/18 0600) Resp:  [15-21] 18 (07/18 0528) BP: (127-191)/(80-110) 139/89 (07/18 0713) SpO2:  [100 %] 100 % (07/18 0528) Last BM Date: 01/29/17 General:  Alert, Well-developed, in NAD, but appears uncomfortable. Heart:  Regular rate and rhythm; no murmurs Pulm:  CTAB.  No increased WOB. Abdomen:  Soft, non-distended.  BS present.  Appropriately tender. Extremities:  Without edema. Neurologic:  Alert and oriented x 4;  grossly normal neurologically. Psych:  Alert and cooperative. Normal mood and affect.  Intake/Output from previous day: 07/17 0701 - 07/18 0700 In: 2750 [I.V.:2650; IV Piggyback:100] Out: 0017 [Urine:4250; Blood:100]  Lab Results:  Recent Labs  01/30/17 0506 01/31/17 0537 02/01/17 0523  WBC 7.9 6.4 15.1*  HGB 11.9* 9.6* 11.6*  HCT 36.3 29.7* 35.8*  PLT 273 251 293   BMET  Recent Labs  01/30/17 0506 01/31/17 0537 02/01/17 0523  NA 137 138 133*  K 3.6 4.0 4.0  CL 102 108 97*  CO2 24 22 26   GLUCOSE 85 93 135*  BUN 6 9 5*  CREATININE 0.57 0.62 0.66  CALCIUM 8.8* 8.6* 8.7*   LFT  Recent Labs  01/29/17 2004  PROT 6.9  ALBUMIN 3.4*  AST 15  ALT 13*  ALKPHOS 62  BILITOT 0.3   Assessment / Plan: 1. Small bowel Crohn's exacerbation with SBO:  Patient is a primary nonresponder to anti-TNF therapy, patient has been on Entyviosince around December 2017, has continued to require prednisone, now with small bowel obstruction, patient was previously scheduled for surgery as an outpatientin early August, but had surgery yesterday, 7/17 with Dr. Johney Maine.  Is POD#1 S/P LAPAROSCOPIC LYSIS OF ADHESIONS, ILEOCECECTOMY, STRICTUROPLASTY, BLADDER REPAIR,  RIGHT SALPINGECTOMY 2. Abnormal CT with possible enterovaginal fistula:  Not seen at surgery yesterday.  -IV steroids D/C'ed for now. -Continue other supportive measures including antiemetics and pain medicine. -Dr. Loletha Carrow to arrange for future long-standing treatment.    LOS: 2 days   Marissa Blankenship D.  02/01/2017, 9:21 AM  Pager number [phone]

## 2017-02-01 NOTE — Progress Notes (Addendum)
PHARMACY NOTE -  Ceftriaxone  Pharmacy has been consulted to dose Ceftriaxone for UTI. Last antibiotic administered was cefotetan at 0300 this morning.  Will order Ceftriaxone 1g IV q24h, starting now for UTI indication.    Noted that urine culture grew E.coli is pan-sensitive.  Would suggest narrowing to cefazolin or if ready to transition to PO, consider keflex or macrobid.  No renal adjustment needed; therefore, pharmacy will sign off since need for further dosage adjustment appears unlikely at present.    Please reconsult if a change in clinical status warrants re-evaluation of dosage.  Thank you for the consult.  Hershal Coria, PharmD, BCPS Pager: [phone] 02/01/2017 4:25 PM

## 2017-02-02 DIAGNOSIS — E871 Hypo-osmolality and hyponatremia: Secondary | ICD-10-CM

## 2017-02-02 LAB — COMPREHENSIVE METABOLIC PANEL
ALBUMIN: 3 g/dL — AB (ref 3.5–5.0)
ALK PHOS: 55 U/L (ref 38–126)
ALT: 12 U/L — AB (ref 14–54)
AST: 15 U/L (ref 15–41)
Anion gap: 10 (ref 5–15)
BILIRUBIN TOTAL: 0.5 mg/dL (ref 0.3–1.2)
CO2: 23 mmol/L (ref 22–32)
CREATININE: 0.61 mg/dL (ref 0.44–1.00)
Calcium: 8.5 mg/dL — ABNORMAL LOW (ref 8.9–10.3)
Chloride: 100 mmol/L — ABNORMAL LOW (ref 101–111)
GFR calc Af Amer: >60 mL/min (ref >60)
GLUCOSE: 85 mg/dL (ref 65–99)
POTASSIUM: 3.9 mmol/L (ref 3.5–5.1)
Sodium: 133 mmol/L — ABNORMAL LOW (ref 135–145)
TOTAL PROTEIN: 6.4 g/dL — AB (ref 6.5–8.1)

## 2017-02-02 LAB — CBC WITH DIFFERENTIAL/PLATELET
BASOS ABS: 0 10*3/uL (ref 0.0–0.1)
BASOS PCT: 0 %
Eosinophils Absolute: 0.1 10*3/uL (ref 0.0–0.7)
Eosinophils Relative: 1 %
HEMATOCRIT: 32.6 % — AB (ref 36.0–46.0)
HEMOGLOBIN: 10.5 g/dL — AB (ref 12.0–15.0)
LYMPHS PCT: 7 %
Lymphs Abs: 0.8 10*3/uL (ref 0.7–4.0)
MCH: 25.7 pg — ABNORMAL LOW (ref 26.0–34.0)
MCHC: 32.2 g/dL (ref 30.0–36.0)
MCV: 79.9 fL (ref 78.0–100.0)
Monocytes Absolute: 1.2 10*3/uL — ABNORMAL HIGH (ref 0.1–1.0)
Monocytes Relative: 10 %
NEUTROS ABS: 9.9 10*3/uL — AB (ref 1.7–7.7)
NEUTROS PCT: 82 %
Platelets: 263 10*3/uL (ref 150–400)
RBC: 4.08 MIL/uL (ref 3.87–5.11)
RDW: 18.5 % — AB (ref 11.5–15.5)
WBC: 12.1 10*3/uL — ABNORMAL HIGH (ref 4.0–10.5)

## 2017-02-02 LAB — MAGNESIUM: Magnesium: 1.7 mg/dL (ref 1.7–2.4)

## 2017-02-02 LAB — PHOSPHORUS: Phosphorus: 2.6 mg/dL (ref 2.5–4.6)

## 2017-02-02 MED ORDER — DIPHENHYDRAMINE HCL 12.5 MG/5ML PO ELIX: 12.5000 mg | ORAL_SOLUTION | Freq: Four times a day (QID) | ORAL | Status: DC | PRN

## 2017-02-02 MED ORDER — CEFAZOLIN SODIUM-DEXTROSE 1-4 GM/50ML-% IV SOLN
1.0000 g | Freq: Two times a day (BID) | INTRAVENOUS | Status: DC
  Administered 2017-02-02 – 2017-02-03 (×2): 1 g via INTRAVENOUS
  Filled 2017-02-02 (×3): qty 50

## 2017-02-02 MED ORDER — PANTOPRAZOLE SODIUM 40 MG PO TBEC
40.0000 mg | DELAYED_RELEASE_TABLET | Freq: Every day | ORAL | Status: DC
  Administered 2017-02-02: 40 mg via ORAL
  Filled 2017-02-02: qty 1

## 2017-02-02 NOTE — Progress Notes (Signed)
Pharmacy IV to PO conversion  The patient is receiving Pantoprazole and Benadryl by the intravenous route.  Based on criteria approved by the Pharmacy and [facility], the medications are being converted to the equivalent oral dose form.   No active GI bleeding or impaired absorption  Not s/p esophagectomy  Documented ability to take oral medications for > 24 hr  Plan to continue treatment for at least 1 day  If you have any questions about this conversion, please contact the Pharmacy Department (ext [phone]).  Thank you.  Reuel Boom, PharmD Pager: [phone] 02/02/2017, 11:42 AM

## 2017-02-02 NOTE — Progress Notes (Signed)
PROGRESS NOTE    Marissa Blankenship  XBW:[id number] DOB: Oct 06, 1965 DOA: 01/29/2017 PCP: Kerin Perna, NP  Brief Narrative:  The patient is a 51 year old female with anxiety, depression, hypertension and unresolved Crohn's disease who was referred to Dr. Marcello Moores for surgical resection (planned in early August after she completed her biological therapy) presented to the ED with 2 weeks of off-and-on abdominal pain associated with nausea, worsening abdominal pain with mild distention, poor by mouth intake and 2 episodes of vomiting. She reported being constipated. Her last dose of entyvio was 6 weeks back. Reports being compliant with her prednisone. Denies any fevers or chills, hematemesis or melena. In the ED vitals were stable. Had normal CBC and chemistry. CT of the abdomen and pelvis showed inflammatory changes in the distal small bowel with areas of adhesions or stricture causing obstruction. CT findings also concerning for enterovaginal fistula Gastroenterology and General Surgery were consulted and the patient was taken to the OR yesterday by Dr. Michael Boston for Laparoscopic lysis of adhesions, ileocecectomy, stricturoplasty, bladder repair and Right Salpingectomy. Patient is POD 2 and improving. Patient was restarted on Abx for UTI and now have been narrowed to IV Cefazolin.   Assessment & Plan:   Principal Problem:   Exacerbation of Crohn's disease with intestinal obstruction status post ileal stricturoplasty and ileocecal resection 01/31/2017 Active Problems:   Essential hypertension   ACID REFLUX DISEASE   Lower urinary tract infectious disease   Inadequate oral intake   Protein-calorie malnutrition, severe   Anemia   Bradycardia   Leukocytosis   E. coli UTI  Exacerbation of Crohn's Disease with intestinal obstruction s/p Laparoscopic Lysis of Adhesions, Ileocecectomy, Stricturoplasty, Bladder Repair and Right Salpingectomy POD 2 -Failed medical therapy with frequent small bowel  obstruction.  -She did not respond to Humira and has been on biological for over 6 months without significant outcome.  -Has not responded to multiple courses of steroids as well. Her gastroenterologist was also considering enrolling her into study at higher centers for alternating medications. -GI and Surgery following. Went to OR 02/01/17 for surgical resection and is POD 2. -Gastroenterology D/C'd IV steroids now and recommending supportive Measures including Antiemetics and Pain Medicine -Gastroenterology Dr. Loletha Carrow to arrange for future long-standing treatment (per GI note Stelara vs. MTX vs. Other ?Clinical Trial) -General Surgery Following and appreciate Recc's -Patient's Diet was advanced from Clears to FULL Liquid this AM by General Surgery  -C/w Acetaminophen 1,000 mg po TID, Oxycodone 5-10 mg po q4hprn for Moderate Pain, Hydromorphone 0.5-2 mg IV q2hprn Moderate and Severe Pain -General Surgery started patient on Alvimopan 12 mg Post-Operatively -C/w Zofran 4 mg IV q6hprn N/V and Compazine 5-10 mg IV q4hprn for Refractory N/V -C/w Simethicone 80 mg po q6hprn Flatulence  -C/w Robaxin 1000 mg po q6hprn Muscle Spasms -C/w Cepacol and Phenol Mouth Spray for Sore Throat/Throat Irritation along with Magic MouthWash 15 mL po 4 Times Daily Prn -C/w IVF with NS + 20 mEQ of KCl 50 mL/hr -Further Management per General Surgery  -Ambulate Patient   Essential Hypertension -Stable. -Home Medications of Amlodipine 10 mg po Daily, Lisinopril 40 mg and Metoprolol 50 mg po Daily held and will need to be restarted when patient is taking po.  -C/w IV Enalaprilat 0.625-1.25 mg IV q6h SBP >200 or DBP >100 -C/w Hydralazine 5-20 mg IV q6hprn for SBP >180 or DBP >100 -C/w IV Metoprolol Tartrate 5 mg IV q6hprn for SBP>180 or for HR>110  GERD -Takes Pantoprazole 40 mg po Daily -  Pantoprazole 40 mg IV qHS changed to po 40 mg po qHS   E Coli UTI  -Urine Culture showed >100,000 CFU of E. Coli -Was on  Empiric Rocephin and received Cefotetan preoperatively.  -Has received 3 days of IV Abx and will restarted IV Ceftriaxone but changed to IV Cefazolin  -Continue to Monitor   ? Enterovaginal fistula, not seen during Surgery  -Patient did report some vaginal discharge.  -Operative findings showedthat the patient had very thickened ileocecal region densely adherent to the dome of the bladder.Thickened inflammation to the bladder but no full breech into it. Suspicious for prior colovesical fistula.  No evidence of any colovaginal fistula Stricture in mid ileum nearly causing fistula to ileocecal region.Stricturoplasty done -Further recommendations for Surgery.  Leukocytosis, improving  -Likely Reactive from Surgery -Patient's WBC went from 6.4 -> 15.1 -> 12.1 -Continue to Monitor and Repeat CBC in AM  Normocytic Anemia/IDA -Patient's Hb/Hct went from 9.6/29.7 -> 11.6/35.8 -> 10.5/32.6 -C/w Ferrous Sulfate 325 mg po BID -Continue to Monitor for S/Sx of Bleeding -Repeat CBC in AM  Hyponatremia -Mild -Patient's Na+ was 133 -IVF decreased to 50 mL/hr -Continue to Monitor and Repeat CMP in AM  DVT prophylaxis: Enoxaparin 40 mg sq Daily Code Status: FULL CODE Family Communication: Discussed with Family at bedside Disposition Plan: Remain Inpatient for now and D/C in 24-48 hours if medically stable  Consultants:   Gastroenterology  General Surgery  Procedures:  LAPAROSCOPIC LYSIS OF ADHESIONS, ILEOCECECTOMY, STRICTUROPLASTY, BLADDER REPAIR, RIGHT SALPINGECTOMY Done by Dr. Michael Boston 01/31/17   Antimicrobials: Anti-infectives    Start     Dose/Rate Route Frequency Ordered Stop   02/01/17 1800  cefTRIAXone (ROCEPHIN) 1 g in dextrose 5 % 50 mL IVPB     1 g 100 mL/hr over 30 Minutes Intravenous Every 24 hours 02/01/17 1626     02/01/17 0300  cefoTEtan (CEFOTAN) 2 g in dextrose 5 % 50 mL IVPB     2 g 100 mL/hr over 30 Minutes Intravenous Every 12 hours 01/31/17 2020 02/01/17 0354    01/31/17 1737  clindamycin (CLEOCIN) 900 mg, gentamicin (GARAMYCIN) 240 mg in sodium chloride 0.9 % 1,000 mL for intraperitoneal lavage  Status:  Discontinued       As needed 01/31/17 1738 01/31/17 1833   01/31/17 1107  cefoTEtan in Dextrose 5% (CEFOTAN) 2-2.08 GM-% IVPB  Status:  Discontinued    Comments:  Bridget Hartshorn   : cabinet override      01/31/17 1107 01/31/17 1122   01/31/17 0600  cefoTEtan (CEFOTAN) 2 g in dextrose 5 % 50 mL IVPB  Status:  Discontinued     2 g 100 mL/hr over 30 Minutes Intravenous On call to O.R. 01/30/17 1500 01/30/17 1504   01/31/17 0600  cefoTEtan (CEFOTAN) 2 g in dextrose 5 % 50 mL IVPB     2 g 100 mL/hr over 30 Minutes Intravenous On call to O.R. 01/30/17 1953 01/31/17 1535   01/31/17 0600  clindamycin (CLEOCIN) 900 mg, gentamicin (GARAMYCIN) 240 mg in sodium chloride 0.9 % 1,000 mL for intraperitoneal lavage      Intraperitoneal To Surgery 01/30/17 1522 02/01/17 0600   01/30/17 2200  cefTRIAXone (ROCEPHIN) 1 g in dextrose 5 % 50 mL IVPB  Status:  Discontinued     1 g 100 mL/hr over 30 Minutes Intravenous Every 24 hours 01/30/17 0332 01/31/17 2020   01/30/17 1617  metroNIDAZOLE (FLAGYL) tablet 1,000 mg  Status:  Discontinued     1,000 mg Oral  3 times per day 01/30/17 1500 01/30/17 1530   01/30/17 1545  metroNIDAZOLE (FLAGYL) tablet 1,000 mg     1,000 mg Oral 3 times per day 01/30/17 1530 01/30/17 2306   01/30/17 1545  neomycin (MYCIFRADIN) tablet 1,000 mg     1,000 mg Oral 3 times per day 01/30/17 1530 01/30/17 2307   01/30/17 1530  neomycin (MYCIFRADIN) tablet 1,000 mg  Status:  Discontinued     1,000 mg Oral 3 times per day 01/30/17 1500 01/30/17 1530   01/30/17 1500  clindamycin (CLEOCIN) 900 mg, gentamicin (GARAMYCIN) 240 mg in sodium chloride 0.9 % 1,000 mL for intraperitoneal lavage  Status:  Discontinued     1 application Intraperitoneal To Surgery 01/30/17 1500 01/30/17 1522   01/30/17 0015  cefTRIAXone (ROCEPHIN) 1 g in dextrose 5 % 50 mL IVPB      1 g 100 mL/hr over 30 Minutes Intravenous  Once 01/30/17 0008 01/30/17 0150     Subjective: Seen and examined and stated she slept well last night. Still has not had a Bowel Movement but is tolerating her FULL Liquid Diet.   Objective: Vitals:   02/01/17 1009 02/01/17 1051 02/01/17 1300 02/02/17 0533  BP: (!) 169/105 (!) 149/94 (!) 157/90 (!) 143/94  Pulse: 94  92 94  Resp: 18  18 16   Temp: 97.9 F (36.6 C)   99.4 F (37.4 C)  TempSrc: Oral  Oral Oral  SpO2: 100%  99% 100%  Weight:      Height:        Intake/Output Summary (Last 24 hours) at 02/02/17 1341 Last data filed at 02/02/17 4166  Gross per 24 hour  Intake             2445 ml  Output             1650 ml  Net              795 ml   Filed Weights   01/30/17 0344  Weight: 58.4 kg (128 lb 12 oz)   Examination: Physical Exam:  Constitutional:  Pleasant thin AAF in NAD and appears calm  Eyes:  Sclerae anicteric; Conjunctivae non-injected ENMT: External ears and nose appear normal. Grossly normal hearing.  Neck: Supple with no JVD Respiratory: CTAB; No wheezing/rales/rhonchi. Patient was not tachypenic or using any accessory muscles to breathe Cardiovascular: RRR; S1, S2. No appreciable lower extremity edema Abdomen: Soft, Mildly tender. Non-distended. Bowel sounds diminished. Abdominal Incisions appear C/D/I GU: Deferred Musculoskeletal: No contractures; No cyanosis Skin:  Warm and Dry. No rashes or lesions appreciated. Abdominal Incisions appear C/D/I Neurologic: CN 2-12 grossly intact. No focal deficits appreciated Psychiatric: Pleasant mood and affect. Intact judgement and insight. Awake and Alert  Data Reviewed: I have personally reviewed following labs and imaging studies  CBC:  Recent Labs Lab 01/29/17 2004 01/30/17 0506 01/31/17 0537 02/01/17 0523 02/02/17 0531  WBC 7.8 7.9 6.4 15.1* 12.1*  NEUTROABS  --  6.7 4.5  --  9.9*  HGB 11.2* 11.9* 9.6* 11.6* 10.5*  HCT 34.9* 36.3 29.7* 35.8* 32.6*    MCV 78.6 79.3 80.7 80.3 79.9  PLT 284 273 251 293 063   Basic Metabolic Panel:  Recent Labs Lab 01/29/17 2004 01/30/17 0506 01/31/17 0537 02/01/17 0523 02/02/17 0531  NA 136 137 138 133* 133*  K 3.6 3.6 4.0 4.0 3.9  CL 102 102 108 97* 100*  CO2 25 24 22 26 23   GLUCOSE 98 85 93 135* 85  BUN 8  6 9 5* <5*  CREATININE 0.64 0.57 0.62 0.66 0.61  CALCIUM 8.7* 8.8* 8.6* 8.7* 8.5*  MG  --  2.2  --   --  1.7  PHOS  --   --   --   --  2.6   GFR: Estimated Creatinine Clearance: 73.5 mL/min (by C-G formula based on SCr of 0.61 mg/dL). Liver Function Tests:  Recent Labs Lab 01/29/17 2004 02/02/17 0531  AST 15 15  ALT 13* 12*  ALKPHOS 62 55  BILITOT 0.3 0.5  PROT 6.9 6.4*  ALBUMIN 3.4* 3.0*    Recent Labs Lab 01/29/17 2004  LIPASE 10*   No results for input(s): AMMONIA in the last 168 hours. Coagulation Profile: No results for input(s): INR, PROTIME in the last 168 hours. Cardiac Enzymes: No results for input(s): CKTOTAL, CKMB, CKMBINDEX, TROPONINI in the last 168 hours. BNP (last 3 results) No results for input(s): PROBNP in the last 8760 hours. HbA1C:  Recent Labs  01/31/17 0537  HGBA1C 5.1   CBG: No results for input(s): GLUCAP in the last 168 hours. Lipid Profile: No results for input(s): CHOL, HDL, LDLCALC, TRIG, CHOLHDL, LDLDIRECT in the last 72 hours. Thyroid Function Tests: No results for input(s): TSH, T4TOTAL, FREET4, T3FREE, THYROIDAB in the last 72 hours. Anemia Panel: No results for input(s): VITAMINB12, FOLATE, FERRITIN, TIBC, IRON, RETICCTPCT in the last 72 hours. Sepsis Labs: No results for input(s): PROCALCITON, LATICACIDVEN in the last 168 hours.  Recent Results (from the past 240 hour(s))  Urine culture     Status: Abnormal   Collection Time: 01/29/17  8:03 PM  Result Value Ref Range Status   Specimen Description URINE, CLEAN CATCH  Final   Special Requests NONE  Final   Culture >=100,000 COLONIES/mL ESCHERICHIA COLI (A)  Final    Report Status 02/01/2017 FINAL  Final   Organism ID, Bacteria ESCHERICHIA COLI (A)  Final      Susceptibility   Escherichia coli - MIC*    AMPICILLIN <=2 SENSITIVE Sensitive     CEFAZOLIN <=4 SENSITIVE Sensitive     CEFTRIAXONE <=1 SENSITIVE Sensitive     CIPROFLOXACIN <=0.25 SENSITIVE Sensitive     GENTAMICIN <=1 SENSITIVE Sensitive     IMIPENEM <=0.25 SENSITIVE Sensitive     NITROFURANTOIN <=16 SENSITIVE Sensitive     TRIMETH/SULFA <=20 SENSITIVE Sensitive     AMPICILLIN/SULBACTAM <=2 SENSITIVE Sensitive     PIP/TAZO <=4 SENSITIVE Sensitive     Extended ESBL NEGATIVE Sensitive     * >=100,000 COLONIES/mL ESCHERICHIA COLI  Surgical PCR screen     Status: None   Collection Time: 01/31/17  8:09 AM  Result Value Ref Range Status   MRSA, PCR NEGATIVE NEGATIVE Final   Staphylococcus aureus NEGATIVE NEGATIVE Final    Comment:        The Xpert SA Assay (FDA approved for NASAL specimens in patients over 54 years of age), is one component of a comprehensive surveillance program.  Test performance has been validated by [facility] for patients greater than or equal to 84 year old. It is not intended to diagnose infection nor to guide or monitor treatment.     Radiology Studies: No results found.   Scheduled Meds: . acetaminophen  1,000 mg Oral TID  . alvimopan  12 mg Oral BID  . enoxaparin (LOVENOX) injection  40 mg Subcutaneous Q24H  . feeding supplement  237 mL Oral BID BM  . ferrous sulfate  325 mg Oral BID WC  . gabapentin  300 mg Oral BID  . lip balm  1 application Topical BID  . mouth rinse  15 mL Mouth Rinse BID  . pantoprazole  40 mg Oral QHS  . psyllium  1 packet Oral Daily   Continuous Infusions: . 0.9 % NaCl with KCl 20 mEq / L 100 mL/hr at 02/02/17 1240  . cefTRIAXone (ROCEPHIN)  IV Stopped (02/01/17 1815)  . lactated ringers    . methocarbamol (ROBAXIN)  IV      LOS: 3 days   Kerney Elbe, DO [facility] Pager [phone]  If 7PM-7AM,  please contact night-coverage [email or web address] Password [email or web address] 02/02/2017, 1:41 PM

## 2017-02-02 NOTE — Progress Notes (Signed)
     [facility] Progress Note  Subjective:  Feeling better today.  Getting ready to eat some grits, sherbet, and Ensure.  Has not been passing any flatus yet.  Dr. Loletha Carrow came by to talk to her yesterday as well.  Objective:  Vital signs in last 24 hours: Temp:  [97.9 F (36.6 C)-99.4 F (37.4 C)] 99.4 F (37.4 C) (07/19 0533) Pulse Rate:  [92-94] 94 (07/19 0533) Resp:  [16-18] 16 (07/19 0533) BP: (143-169)/(90-105) 143/94 (07/19 0533) SpO2:  [99 %-100 %] 100 % (07/19 0533) Last BM Date: 01/29/17 General:  Alert, Well-developed, in NAD Heart:  Regular rate and rhythm; no murmurs Pulm:  CTAB.  No increased WOB. Abdomen:  Soft, non-distended.  BS very sparse and quiet.  Appropriately tender.  Extremities:  Without edema. Neurologic:  Alert and oriented x 4;  grossly normal neurologically. Psych:  Alert and cooperative. Normal mood and affect.  Intake/Output from previous day: 07/18 0701 - 07/19 0700 In: 3190 [P.O.:740; I.V.:2400; IV Piggyback:50] Out: 2250 [Urine:2250]  Lab Results:  Recent Labs  01/31/17 0537 02/01/17 0523 02/02/17 0531  WBC 6.4 15.1* 12.1*  HGB 9.6* 11.6* 10.5*  HCT 29.7* 35.8* 32.6*  PLT 251 293 263   BMET  Recent Labs  01/31/17 0537 02/01/17 0523 02/02/17 0531  NA 138 133* 133*  K 4.0 4.0 3.9  CL 108 97* 100*  CO2 22 26 23   GLUCOSE 93 135* 85  BUN 9 5* <5*  CREATININE 0.62 0.66 0.61  CALCIUM 8.6* 8.7* 8.5*   LFT  Recent Labs  02/02/17 0531  PROT 6.4*  ALBUMIN 3.0*  AST 15  ALT 12*  ALKPHOS 55  BILITOT 0.5   Assessment / Plan: 1. Small bowel Crohn's exacerbation with SBO: Patient is a primary nonresponder to anti-TNF therapy, patient has been on Entyviosince around December 2017, has continued to require prednisone, now with small bowel obstruction, patient was previously scheduled for surgeryas an outpatientin early August, but had surgery 7/17 with Dr. Johney Maine.  Is POD#2 S/P LAPAROSCOPIC LYSIS OF ADHESIONS,  ILEOCECECTOMY, STRICTUROPLASTY, BLADDER REPAIR, RIGHT SALPINGECTOMY 2. Abnormal CT with possible enterovaginal fistula:  Not seen at the time of surgery.  -Steroids D/C'ed for now. -Continue other supportive measures including antiemetics and pain medicine. -Dr. Loletha Carrow to arrange for future long-standing treatment.   LOS: 3 days   Marissa Blankenship D.  02/02/2017, 9:17 AM  Pager number [phone]

## 2017-02-02 NOTE — Progress Notes (Signed)
2 Days Post-Op    CC:  Abdominal pain, nausea and vomiting   Subjective: Just had first full liquids and did well with it.  She is starting to take more PO meds for pain.  No BM yet.    Objective: Vital signs in last 24 hours: Temp:  [99.4 F (37.4 C)] 99.4 F (37.4 C) (07/19 0533) Pulse Rate:  [92-94] 94 (07/19 0533) Resp:  [16-18] 16 (07/19 0533) BP: (143-157)/(90-94) 143/94 (07/19 0533) SpO2:  [99 %-100 %] 100 % (07/19 0533) Last BM Date: 01/29/17  740 PO IV 2450 Afebrile  BP up some Labs OK WBC up but trending down  Intake/Output from previous day: 07/18 0701 - 07/19 0700 In: 3190 [P.O.:740; I.V.:2400; IV Piggyback:50] Out: 2250 [Urine:2250] Intake/Output this shift: Total I/O In: 240 [P.O.:240] Out: 0   General appearance: alert, cooperative and no distress Resp: clear to auscultation bilaterally GI: soft, sore, waffle dressing in place, + BS and flatus.  foley is out  Lab Results:   Recent Labs  02/01/17 0523 02/02/17 0531  WBC 15.1* 12.1*  HGB 11.6* 10.5*  HCT 35.8* 32.6*  PLT 293 263    BMET  Recent Labs  02/01/17 0523 02/02/17 0531  NA 133* 133*  K 4.0 3.9  CL 97* 100*  CO2 26 23  GLUCOSE 135* 85  BUN 5* <5*  CREATININE 0.66 0.61  CALCIUM 8.7* 8.5*   PT/INR No results for input(s): LABPROT, INR in the last 72 hours.   Recent Labs Lab 01/29/17 2004 02/02/17 0531  AST 15 15  ALT 13* 12*  ALKPHOS 62 55  BILITOT 0.3 0.5  PROT 6.9 6.4*  ALBUMIN 3.4* 3.0*     Lipase     Component Value Date/Time   LIPASE 10 (L) 01/29/2017 2004     Medications: . acetaminophen  1,000 mg Oral TID  . alvimopan  12 mg Oral BID  . enoxaparin (LOVENOX) injection  40 mg Subcutaneous Q24H  . feeding supplement  237 mL Oral BID BM  . ferrous sulfate  325 mg Oral BID WC  . gabapentin  300 mg Oral BID  . lip balm  1 application Topical BID  . mouth rinse  15 mL Mouth Rinse BID  . pantoprazole  40 mg Oral QHS  . psyllium  1 packet Oral Daily   .  0.9 % NaCl with KCl 20 mEq / L 100 mL/hr at 02/01/17 1159  . cefTRIAXone (ROCEPHIN)  IV Stopped (02/01/17 1815)  . lactated ringers    . methocarbamol (ROBAXIN)  IV     Anti-infectives    Start     Dose/Rate Route Frequency Ordered Stop   02/01/17 1800  cefTRIAXone (ROCEPHIN) 1 g in dextrose 5 % 50 mL IVPB     1 g 100 mL/hr over 30 Minutes Intravenous Every 24 hours 02/01/17 1626     02/01/17 0300  cefoTEtan (CEFOTAN) 2 g in dextrose 5 % 50 mL IVPB     2 g 100 mL/hr over 30 Minutes Intravenous Every 12 hours 01/31/17 2020 02/01/17 0354   01/31/17 1737  clindamycin (CLEOCIN) 900 mg, gentamicin (GARAMYCIN) 240 mg in sodium chloride 0.9 % 1,000 mL for intraperitoneal lavage  Status:  Discontinued       As needed 01/31/17 1738 01/31/17 1833   01/31/17 1107  cefoTEtan in Dextrose 5% (CEFOTAN) 2-2.08 GM-% IVPB  Status:  Discontinued    Comments:  Bridget Hartshorn   : cabinet override  01/31/17 1107 01/31/17 1122   01/31/17 0600  cefoTEtan (CEFOTAN) 2 g in dextrose 5 % 50 mL IVPB  Status:  Discontinued     2 g 100 mL/hr over 30 Minutes Intravenous On call to O.R. 01/30/17 1500 01/30/17 1504   01/31/17 0600  cefoTEtan (CEFOTAN) 2 g in dextrose 5 % 50 mL IVPB     2 g 100 mL/hr over 30 Minutes Intravenous On call to O.R. 01/30/17 1953 01/31/17 1535   01/31/17 0600  clindamycin (CLEOCIN) 900 mg, gentamicin (GARAMYCIN) 240 mg in sodium chloride 0.9 % 1,000 mL for intraperitoneal lavage      Intraperitoneal To Surgery 01/30/17 1522 02/01/17 0600   01/30/17 2200  cefTRIAXone (ROCEPHIN) 1 g in dextrose 5 % 50 mL IVPB  Status:  Discontinued     1 g 100 mL/hr over 30 Minutes Intravenous Every 24 hours 01/30/17 0332 01/31/17 2020   01/30/17 1617  metroNIDAZOLE (FLAGYL) tablet 1,000 mg  Status:  Discontinued     1,000 mg Oral 3 times per day 01/30/17 1500 01/30/17 1530   01/30/17 1545  metroNIDAZOLE (FLAGYL) tablet 1,000 mg     1,000 mg Oral 3 times per day 01/30/17 1530 01/30/17 2306   01/30/17  1545  neomycin (MYCIFRADIN) tablet 1,000 mg     1,000 mg Oral 3 times per day 01/30/17 1530 01/30/17 2307   01/30/17 1530  neomycin (MYCIFRADIN) tablet 1,000 mg  Status:  Discontinued     1,000 mg Oral 3 times per day 01/30/17 1500 01/30/17 1530   01/30/17 1500  clindamycin (CLEOCIN) 900 mg, gentamicin (GARAMYCIN) 240 mg in sodium chloride 0.9 % 1,000 mL for intraperitoneal lavage  Status:  Discontinued     1 application Intraperitoneal To Surgery 01/30/17 1500 01/30/17 1522   01/30/17 0015  cefTRIAXone (ROCEPHIN) 1 g in dextrose 5 % 50 mL IVPB     1 g 100 mL/hr over 30 Minutes Intravenous  Once 01/30/17 0008 01/30/17 0150      Assessment/Plan Crohn's exacerbation with intestinal obstruction S/p LAPAROSCOPIC LYSIS OF ADHESIONS, ILEOCECECTOMY, STRICTUROPLASTY, BLADDER REPAIR, RIGHT SALPINGECTOMY, 01/31/17, Dr. Michael Boston Hypertension GERD Possible UTI FEN: IV fluids/fulls RR:[id number] x 3 days, Cefotetan pre op, Clindamycin/Gentamycin pre op - now off abx DVT:  Lovenox   Plan:  Soft diet for supper, continue to ambulate, and get her off Dilaudid.  She is still taking some Zofran, starting oxycodone.  On scheduled Tylenol.  LOS: 3 days    Marissa Blankenship 02/02/2017 [phone]

## 2017-02-03 LAB — CBC WITH DIFFERENTIAL/PLATELET
Basophils Absolute: 0 K/uL (ref 0.0–0.1)
Basophils Relative: 0 %
Eosinophils Absolute: 0.2 K/uL (ref 0.0–0.7)
Eosinophils Relative: 2 %
HCT: 28.7 % — ABNORMAL LOW (ref 36.0–46.0)
Hemoglobin: 9.7 g/dL — ABNORMAL LOW (ref 12.0–15.0)
Lymphocytes Relative: 6 %
Lymphs Abs: 0.7 K/uL (ref 0.7–4.0)
MCH: 26.8 pg (ref 26.0–34.0)
MCHC: 33.8 g/dL (ref 30.0–36.0)
MCV: 79.3 fL (ref 78.0–100.0)
Monocytes Absolute: 1 K/uL (ref 0.1–1.0)
Monocytes Relative: 8 %
Neutro Abs: 10.2 K/uL — ABNORMAL HIGH (ref 1.7–7.7)
Neutrophils Relative %: 84 %
Platelets: 237 K/uL (ref 150–400)
RBC: 3.62 MIL/uL — ABNORMAL LOW (ref 3.87–5.11)
RDW: 18.4 % — ABNORMAL HIGH (ref 11.5–15.5)
WBC: 12.1 K/uL — ABNORMAL HIGH (ref 4.0–10.5)

## 2017-02-03 LAB — COMPREHENSIVE METABOLIC PANEL
ALT: 11 U/L — ABNORMAL LOW (ref 14–54)
ANION GAP: 7 (ref 5–15)
AST: 14 U/L — AB (ref 15–41)
Albumin: 2.6 g/dL — ABNORMAL LOW (ref 3.5–5.0)
Alkaline Phosphatase: 52 U/L (ref 38–126)
BILIRUBIN TOTAL: 0.7 mg/dL (ref 0.3–1.2)
BUN: 7 mg/dL (ref 6–20)
CHLORIDE: 102 mmol/L (ref 101–111)
CO2: 25 mmol/L (ref 22–32)
Calcium: 8.3 mg/dL — ABNORMAL LOW (ref 8.9–10.3)
Creatinine, Ser: 0.6 mg/dL (ref 0.44–1.00)
Glucose, Bld: 80 mg/dL (ref 65–99)
POTASSIUM: 3.9 mmol/L (ref 3.5–5.1)
Sodium: 134 mmol/L — ABNORMAL LOW (ref 135–145)
TOTAL PROTEIN: 5.9 g/dL — AB (ref 6.5–8.1)

## 2017-02-03 LAB — PHOSPHORUS: Phosphorus: 1.9 mg/dL — ABNORMAL LOW (ref 2.5–4.6)

## 2017-02-03 LAB — MAGNESIUM: MAGNESIUM: 1.8 mg/dL (ref 1.7–2.4)

## 2017-02-03 MED ORDER — MENTHOL 3 MG MT LOZG: 1.0000 | LOZENGE | OROMUCOSAL | 12 refills | Status: DC | PRN

## 2017-02-03 MED ORDER — PSYLLIUM 95 % PO PACK: 1.0000 | PACK | Freq: Every day | ORAL | 0 refills | Status: DC

## 2017-02-03 MED ORDER — DEXTROSE 5 % IV SOLN
20.0000 mmol | Freq: Once | INTRAVENOUS | Status: AC
  Administered 2017-02-03: 20 mmol via INTRAVENOUS
  Filled 2017-02-03: qty 6.67

## 2017-02-03 MED ORDER — LIP MEDEX EX OINT: 1.0000 "application " | TOPICAL_OINTMENT | Freq: Two times a day (BID) | CUTANEOUS | 0 refills | Status: DC

## 2017-02-03 MED ORDER — HYDROCORTISONE 1 % EX CREA: 1.0000 "application " | TOPICAL_CREAM | Freq: Three times a day (TID) | CUTANEOUS | 0 refills | Status: DC | PRN

## 2017-02-03 MED ORDER — OXYCODONE HCL 5 MG PO TABS: 5.0000 mg | ORAL_TABLET | ORAL | 0 refills | Status: DC | PRN

## 2017-02-03 MED ORDER — METHOCARBAMOL 500 MG PO TABS: 500.0000 mg | ORAL_TABLET | Freq: Four times a day (QID) | ORAL | 0 refills | Status: DC | PRN

## 2017-02-03 MED ORDER — GABAPENTIN 300 MG PO CAPS: 300.0000 mg | ORAL_CAPSULE | Freq: Two times a day (BID) | ORAL | 0 refills | Status: DC

## 2017-02-03 MED ORDER — HYDROMORPHONE HCL-NACL 0.5-0.9 MG/ML-% IV SOSY: 0.5000 mg | PREFILLED_SYRINGE | INTRAVENOUS | Status: DC | PRN

## 2017-02-03 MED ORDER — ALUM & MAG HYDROXIDE-SIMETH 200-200-20 MG/5ML PO SUSP: 30.0000 mL | Freq: Four times a day (QID) | ORAL | 0 refills | Status: DC | PRN

## 2017-02-03 MED ORDER — ENSURE SURGERY PO LIQD: 237.0000 mL | Freq: Two times a day (BID) | ORAL | 0 refills | Status: DC

## 2017-02-03 MED ORDER — FERROUS SULFATE 325 (65 FE) MG PO TABS: 325.0000 mg | ORAL_TABLET | Freq: Two times a day (BID) | ORAL | 3 refills | Status: DC

## 2017-02-03 MED ORDER — GUAIFENESIN-DM 100-10 MG/5ML PO SYRP: 10.0000 mL | ORAL_SOLUTION | ORAL | 0 refills | Status: DC | PRN

## 2017-02-03 MED ORDER — LISINOPRIL 40 MG PO TABS: 40.0000 mg | ORAL_TABLET | Freq: Every morning | ORAL | 0 refills | Status: DC

## 2017-02-03 MED ORDER — HYDROCORTISONE 2.5 % RE CREA: 1.0000 "application " | TOPICAL_CREAM | Freq: Four times a day (QID) | RECTAL | 0 refills | Status: DC | PRN

## 2017-02-03 NOTE — Progress Notes (Signed)
Patient ID: Marissa Blankenship, female   DOB: [birth year], 51 y.o.   MRN: [id number]  [facility] Surgery Progress Note  3 Days Post-Op  Subjective: CC- Crohn's Tolerated full liquids yesterday. Having mostly loose, but 1 soft BM as well. Urinating with no issues. Denies n/v. Abdominal pain well controlled on oral medications. She has been ambulating.   Objective: Vital signs in last 24 hours: Temp:  [99.6 F (37.6 C)-99.8 F (37.7 C)] 99.8 F (37.7 C) (07/20 0518) Pulse Rate:  [103-109] 109 (07/20 0518) Resp:  [14-18] 18 (07/20 0518) BP: (138-156)/(99-102) 138/99 (07/20 0518) SpO2:  [96 %-100 %] 96 % (07/20 0518) Last BM Date: 02/02/17  Intake/Output from previous day: 07/19 0701 - 07/20 0700 In: 3260 [P.O.:1440; I.V.:1770; IV Piggyback:50] Out: 2250 [Urine:2250] Intake/Output this shift: No intake/output data recorded.  PE: Gen:  Alert, NAD, pleasant HEENT: EOM's intact, pupils equal  Card:  RRR, no M/G/R heard Pulm:  CTAB, no W/R/R, effort normal Abd: Soft, NT/ND, +BS, incisions C/D/I with wicks in place >> wicks removed and dry dressing applied Psych: A&Ox3  Skin: no rashes noted, warm and dry  Lab Results:   Recent Labs  02/02/17 0531 02/03/17 0534  WBC 12.1* 12.1*  HGB 10.5* 9.7*  HCT 32.6* 28.7*  PLT 263 237   BMET  Recent Labs  02/02/17 0531 02/03/17 0534  NA 133* 134*  K 3.9 3.9  CL 100* 102  CO2 23 25  GLUCOSE 85 80  BUN <5* 7  CREATININE 0.61 0.60  CALCIUM 8.5* 8.3*   PT/INR No results for input(s): LABPROT, INR in the last 72 hours. CMP     Component Value Date/Time   NA 134 (L) 02/03/2017 0534   K 3.9 02/03/2017 0534   CL 102 02/03/2017 0534   CO2 25 02/03/2017 0534   GLUCOSE 80 02/03/2017 0534   BUN 7 02/03/2017 0534   CREATININE 0.60 02/03/2017 0534   CALCIUM 8.3 (L) 02/03/2017 0534   PROT 5.9 (L) 02/03/2017 0534   ALBUMIN 2.6 (L) 02/03/2017 0534   AST 14 (L) 02/03/2017 0534   ALT 11 (L) 02/03/2017 0534   ALKPHOS 52 02/03/2017  0534   BILITOT 0.7 02/03/2017 0534   GFRNONAA >60 02/03/2017 0534   GFRAA >60 02/03/2017 0534   Lipase     Component Value Date/Time   LIPASE 10 (L) 01/29/2017 2004       Studies/Results: No results found.  Anti-infectives: Anti-infectives    Start     Dose/Rate Route Frequency Ordered Stop   02/02/17 1800  ceFAZolin (ANCEF) IVPB 1 g/50 mL premix     1 g 100 mL/hr over 30 Minutes Intravenous Every 12 hours 02/02/17 1348     02/01/17 1800  cefTRIAXone (ROCEPHIN) 1 g in dextrose 5 % 50 mL IVPB  Status:  Discontinued     1 g 100 mL/hr over 30 Minutes Intravenous Every 24 hours 02/01/17 1626 02/02/17 1348   02/01/17 0300  cefoTEtan (CEFOTAN) 2 g in dextrose 5 % 50 mL IVPB     2 g 100 mL/hr over 30 Minutes Intravenous Every 12 hours 01/31/17 2020 02/01/17 0354   01/31/17 1737  clindamycin (CLEOCIN) 900 mg, gentamicin (GARAMYCIN) 240 mg in sodium chloride 0.9 % 1,000 mL for intraperitoneal lavage  Status:  Discontinued       As needed 01/31/17 1738 01/31/17 1833   01/31/17 1107  cefoTEtan in Dextrose 5% (CEFOTAN) 2-2.08 GM-% IVPB  Status:  Discontinued    Comments:  Bridget Hartshorn   :  cabinet override      01/31/17 1107 01/31/17 1122   01/31/17 0600  cefoTEtan (CEFOTAN) 2 g in dextrose 5 % 50 mL IVPB  Status:  Discontinued     2 g 100 mL/hr over 30 Minutes Intravenous On call to O.R. 01/30/17 1500 01/30/17 1504   01/31/17 0600  cefoTEtan (CEFOTAN) 2 g in dextrose 5 % 50 mL IVPB     2 g 100 mL/hr over 30 Minutes Intravenous On call to O.R. 01/30/17 1953 01/31/17 1535   01/31/17 0600  clindamycin (CLEOCIN) 900 mg, gentamicin (GARAMYCIN) 240 mg in sodium chloride 0.9 % 1,000 mL for intraperitoneal lavage      Intraperitoneal To Surgery 01/30/17 1522 02/01/17 0600   01/30/17 2200  cefTRIAXone (ROCEPHIN) 1 g in dextrose 5 % 50 mL IVPB  Status:  Discontinued     1 g 100 mL/hr over 30 Minutes Intravenous Every 24 hours 01/30/17 0332 01/31/17 2020   01/30/17 1617  metroNIDAZOLE  (FLAGYL) tablet 1,000 mg  Status:  Discontinued     1,000 mg Oral 3 times per day 01/30/17 1500 01/30/17 1530   01/30/17 1545  metroNIDAZOLE (FLAGYL) tablet 1,000 mg     1,000 mg Oral 3 times per day 01/30/17 1530 01/30/17 2306   01/30/17 1545  neomycin (MYCIFRADIN) tablet 1,000 mg     1,000 mg Oral 3 times per day 01/30/17 1530 01/30/17 2307   01/30/17 1530  neomycin (MYCIFRADIN) tablet 1,000 mg  Status:  Discontinued     1,000 mg Oral 3 times per day 01/30/17 1500 01/30/17 1530   01/30/17 1500  clindamycin (CLEOCIN) 900 mg, gentamicin (GARAMYCIN) 240 mg in sodium chloride 0.9 % 1,000 mL for intraperitoneal lavage  Status:  Discontinued     1 application Intraperitoneal To Surgery 01/30/17 1500 01/30/17 1522   01/30/17 0015  cefTRIAXone (ROCEPHIN) 1 g in dextrose 5 % 50 mL IVPB     1 g 100 mL/hr over 30 Minutes Intravenous  Once 01/30/17 0008 01/30/17 0150       Assessment/Plan Hypertension GERD Possible UTI - on ancef  Crohn's exacerbation with intestinal obstruction S/p LAPAROSCOPIC LYSIS OF ADHESIONS, ILEOCECECTOMY, STRICTUROPLASTY, BLADDER REPAIR, RIGHT SALPINGECTOMY, 01/31/17, Dr. Michael Boston - POD 3 - surgical pathology pending - WBC trending down - bowel function returned, good UOP - advance to soft diet and decrease IVF. D/c entereg. Pain well controlled on oral medications. If patient tolerates soft diet, she will be ready for discharge from a surgical standpoint later today. She will follow-up with Dr. Johney Maine in 2 weeks, and with GI in 2 weeks as well. She is off prednisone and will discuss when to restart Crohn's medications at OP appointment with GI.  FEN: soft diet ID: Ancef 7/19>>7/20, Ceftriaxone 7/17 x1 dose, rocephin 7/18 x1 dose, Cefotetan 7/18 x1 dose, Clindamycin/gentamycin 7/17 x1 dose DVT: SCDs, Lovenox    LOS: 4 days    Jerrye Beavers , [facility] Surgery 02/03/2017, 8:34 AM Pager: [phone] Consults: [phone] Mon-Fri 7:00 am-4:30  pm Sat-Sun 7:00 am-11:30 am

## 2017-02-03 NOTE — Discharge Summary (Signed)
Physician Discharge Summary  Marissa Blankenship TKW:[id number] DOB: [birth year] DOA: 01/29/2017  PCP: Kerin Perna, NP  Admit date: 01/29/2017 Discharge date: 02/03/2017  Admitted From: Home Disposition:  Home  Recommendations for Outpatient Follow-up:  1. Follow up with PCP in 1-2 weeks; Appointment made for you on July 27, 18 at 3:30 PM 2. Follow up with General Surgery Dr. Johney Maine on 02/16/17 at 3:15 pm 3. Follow up with Gastroenterology Dr. Loletha Carrow on 03/14/17 at 9:45 am 4. Please obtain CMP/CBC, Mag Phos in one week  Home Health: No Equipment/Devices: None   Discharge Condition: Stable CODE STATUS: FULL CODE  Diet recommendation: Soft Diet  Brief/Interim Summary: The patient is a 51 year old female with anxiety, depression, hypertension and unresolved Crohn's disease who was referred to Dr. Marcello Moores for surgical resection (planned in early August after she completed her biological therapy) presented to the ED with 2 weeks of off-and-on abdominal pain associated with nausea, worsening abdominal pain with mild distention, poor by mouth intake and 2 episodes of vomiting. She reported being constipated. Her last dose of entyvio was 6 weeks back. Reports being compliant with her prednisone. Denied any fevers or chills, hematemesis or melena.  In the ED vitals were stable. Had normal CBC and chemistry. CT of the abdomen and pelvis showed inflammatory changes in the distal small bowel with areas of adhesions or stricture causing obstruction. CT findings also concerning for enterovaginal fistula.  Gastroenterology and General Surgery were consulted and the patient was taken to the OR on 01/31/17 by Dr. Michael Boston for Laparoscopic lysis of adhesions, ileocecectomy, stricturoplasty, bladder repair and Right Salpingectomy. Patient is POD 3 and improving. She was also found to have a UTI and treated with IV Abx and narrowed to IV Cefazolin. Patient completed a 5 day course of Abx and will not D/C her on Abx.  Gastroenterology stopped her steroids and she will need to follow up with GI as an outpatient to coordinate her long term medication regimen for Crohn's disease as an outpatient. Patient's diet was advanced, she was tolerating po well, ambulating well, had adequate pain control with po medications, had bowel movements and was urinating without difficulty so she was deemed medically stable to D/C Home. Patient will need to follow up with PCP, General Surgery and Gastroenterology as an outpatient as she has significantly improved and ready for Discharge.   Discharge Diagnoses:  Principal Problem:   Exacerbation of Crohn's disease with intestinal obstruction status post ileal stricturoplasty and ileocecal resection 01/31/2017 Active Problems:   Essential hypertension   ACID REFLUX DISEASE   Lower urinary tract infectious disease   Inadequate oral intake   Protein-calorie malnutrition, severe   Anemia   Bradycardia   Leukocytosis   E. coli UTI   Hyponatremia  Exacerbation of Crohn's Disease with intestinal obstruction s/p Laparoscopic Lysis of Adhesions, Ileocecectomy, Stricturoplasty, Bladder Repair and Right Salpingectomy POD 3 -Failed medical therapy with frequent small bowel obstruction.  -She did not respond to Humira and has been on biological for over 6 months without significant outcome.  -Has not responded to multiple courses of steroids as well. Her gastroenterologist was also considering enrolling her into study at higher centers for alternating medications. -GI and Surgery followed and appreciated Recommendations. Went to OR7/17/18 for surgical resection and is POD 3. -Gastroenterology D/C'd steroids now and recommending supportive Measures including Antiemetics and Pain Medicine -Gastroenterology Dr. Loletha Carrow to arrange for future long-standing treatment (per GI note Stelara vs. MTX vs. Other ?Clinical Trial) -Patient's Diet was  advanced from Full to Soft and patient tolerated it  well -C/w Acetaminophen 1,000 mg po TIDprn and, Oxycodone 5-10 mg po q4hprn for Moderate Pain,for Pain Control  -General Surgery started patient on Alvimopan 12 mg Post-Operatively and that has now stopped now that she is having Bowel Movements -C/w Zofran 4 mg po q8h -C/w Simethicone 80 mg po q6hprn Flatulence  -C/w Robaxin 580m po q6hprn Muscle Spasms -C/w Cepacol for Sore Throat/Throat Irritation  -D/C'id IVF with NS + 20 mEQ of KCl 50 mL/hr -Patient improved. Follow up with PCP, General Surgery, and Gastroenterology as an outpatient   Essential Hypertension -Stable. -Home Medications of Amlodipine 10 mg po Daily, Lisinopril 40 mg and Metoprolol 50 mg po restarted at D/C -Follow up with PCP for Further blood pressure monitoring and medication adjustment   GERD -C/w Pantoprazole po 40 mg po qHS   E Coli UTI  -Urine Culture showed >100,000 CFU of E. Coli -Was on Empiric Rocephin and received Cefotetan preoperatively.  -S/p 5 day total of Abx -Continue to Monitor for S/Sx and follow up with PCP    ? Enterovaginal fistula, not seen during Surgery  -Patient did report some vaginal discharge.  -Operative findings showedthat the patient had very thickened ileocecal region densely adherent to the dome of the bladder. Thickened inflammation to the bladder but no full breech into it. Suspicious for prior colovesical fistula. No evidence of any colovaginal fistula Stricture in mid ileum nearly causing fistula to ileocecal region. Stricturoplasty done -Further recommendations via Surgery. -Follow up with Dr. GJohney Maineas an outpatient  Leukocytosis, improving  -Likely Reactive from Surgery -Patient's WBC went from 6.4 -> 15.1 -> 12.1 -> 12.1 -Continue to Monitor and Repeat CBC as an outpatient with PCP   Normocytic Anemia/IDA -Patient's Hb/Hct went from 9.6/29.7 -> 11.6/35.8 -> 10.5/32.6 -> 9.7/28.7 -C/w Ferrous Sulfate 325 mg po BID -Continue to Monitor for S/Sx of  Bleeding -Repeat CBC as an outpatient with PCP   Hyponatremia -Mild at 134 -Was on IVF and got IV Sodium Phosphate prior to D/C -Follow up CMP as an outpatient   Hypophosphatemia -Phos Level this AM was 1.9 -Replete with 20 mmol of IV Sodium Phosphate -Repeat Phos Level as an outpatient  Discharge Instructions  Discharge Instructions    Call MD for:  difficulty breathing, headache or visual disturbances    Complete by:  As directed    Call MD for:  extreme fatigue    Complete by:  As directed    Call MD for:  hives    Complete by:  As directed    Call MD for:  persistant dizziness or light-headedness    Complete by:  As directed    Call MD for:  persistant nausea and vomiting    Complete by:  As directed    Call MD for:  redness, tenderness, or signs of infection (pain, swelling, redness, odor or green/yellow discharge around incision site)    Complete by:  As directed    Call MD for:  severe uncontrolled pain    Complete by:  As directed    Call MD for:  temperature >100.4    Complete by:  As directed    Diet - low sodium heart healthy    Complete by:  As directed    Discharge instructions    Complete by:  As directed    Follow up with PCP, General Surgery and Gastroenterology as an outpatient. Take all medications as prescribed. If symptoms change or worsen  please return to the ED for evaluation.   Increase activity slowly    Complete by:  As directed      Allergies as of 02/03/2017   No Known Allergies     Medication List    STOP taking these medications   predniSONE 5 MG tablet Commonly known as:  DELTASONE     TAKE these medications   acetaminophen-codeine 300-30 MG tablet Commonly known as:  TYLENOL #3 Take 1-2 tablets by mouth every 6 (six) hours as needed for moderate pain.   alum & mag hydroxide-simeth 200-200-20 MG/5ML suspension Commonly known as:  MAALOX/MYLANTA Take 30 mLs by mouth every 6 (six) hours as needed for indigestion or heartburn.    amLODipine 10 MG tablet Commonly known as:  NORVASC Take 10 mg by mouth daily after breakfast.   calcium-vitamin D 500-200 MG-UNIT tablet Commonly known as:  OSCAL WITH D Take 1 tablet by mouth 2 (two) times daily.   dicyclomine 20 MG tablet Commonly known as:  BENTYL Take 1 tablet (20 mg total) by mouth 3 (three) times daily as needed (for abdominal pain.). What changed:  when to take this   feeding supplement Liqd Take 237 mLs by mouth 2 (two) times daily between meals.   ferrous sulfate 325 (65 FE) MG tablet Take 1 tablet (325 mg total) by mouth 2 (two) times daily with a meal. What changed:  See the new instructions.   gabapentin 300 MG capsule Commonly known as:  NEURONTIN Take 1 capsule (300 mg total) by mouth 2 (two) times daily.   guaiFENesin-dextromethorphan 100-10 MG/5ML syrup Commonly known as:  ROBITUSSIN DM Take 10 mLs by mouth every 4 (four) hours as needed for cough (chest congestion).   hydrocortisone 2.5 % rectal cream Commonly known as:  ANUSOL-HC Apply 1 application topically 4 (four) times daily as needed for hemorrhoids.   hydrocortisone cream 1 % Apply 1 application topically 3 (three) times daily as needed for itching (minor skin irritation).   lip balm ointment Apply 1 application topically 2 (two) times daily.   lisinopril 40 MG tablet Commonly known as:  PRINIVIL,ZESTRIL Take 1 tablet (40 mg total) by mouth every morning.   menthol-cetylpyridinium 3 MG lozenge Commonly known as:  CEPACOL Take 1 lozenge (3 mg total) by mouth as needed for sore throat (throat irritation / pain).   methocarbamol 500 MG tablet Commonly known as:  ROBAXIN Take 1 tablet (500 mg total) by mouth every 6 (six) hours as needed for muscle spasms.   metoprolol tartrate 50 MG tablet Commonly known as:  LOPRESSOR Take 50 mg by mouth daily after breakfast.   ondansetron 4 MG tablet Commonly known as:  ZOFRAN Take 1 tablet (4 mg total) by mouth every 8 (eight) hours  as needed for nausea or vomiting.   oxyCODONE 5 MG immediate release tablet Commonly known as:  Oxy IR/ROXICODONE Take 1-2 tablets (5-10 mg total) by mouth every 4 (four) hours as needed for moderate pain or severe pain.   pantoprazole 40 MG tablet Commonly known as:  PROTONIX Take 40 mg by mouth daily.   psyllium 95 % Pack Commonly known as:  HYDROCIL/METAMUCIL Take 1 packet by mouth daily.   simethicone 80 MG chewable tablet Commonly known as:  MYLICON Chew 80 mg by mouth every 6 (six) hours as needed for flatulence.      Follow-up Information    Doran Stabler, MD Follow up on 03/14/2017.   Specialty:  Gastroenterology Why:  9:45  am Contact information: [address] [place] [address] [phone]        Michael Boston, MD. Go on 02/16/2017.   Specialty:  General Surgery Why:  Your appointment is 02/16/17 at 3:15PM. Please arrive 15 minutes prior to your appointment to check in and fill out necessary paperwork. Contact information: [address] [place] [address] [phone]        Kerin Perna, NP. Call in 1 week(s).   Specialty:  Internal Medicine Why:  Appointment Friday February 10, 2017 at 3:30 pm Contact information: [address] [place] [address] [phone]          No Known Allergies  Consultations:  Gastroenterology  General Surgery  Procedures/Studies: Ct Abdomen Pelvis W Contrast  Result Date: 01/30/2017 CLINICAL DATA:  51 year old female with history of Crohn's presenting with generalized abdominal pain. EXAM: CT ABDOMEN AND PELVIS WITH CONTRAST TECHNIQUE: Multidetector CT imaging of the abdomen and pelvis was performed using the standard protocol following bolus administration of intravenous contrast. CONTRAST:  100 cc Isovue-300 COMPARISON:  CT dated 11/28/2016 FINDINGS: Lower chest: The visualized lung bases are clear. No intra-abdominal free air. There is moderate amount of free fluid  within the posterior pelvis, increased compared to the prior CT. Hepatobiliary: Probable mild fatty infiltration of the liver. No intrahepatic biliary ductal dilatation. The gallbladder is unremarkable. Pancreas: Unremarkable. No pancreatic ductal dilatation or surrounding inflammatory changes. Spleen: Normal in size without focal abnormality. Adrenals/Urinary Tract: Mild bilateral pelviectasis, right greater left and similar or minimally increased compared to the prior CT. There is symmetric enhancement and excretion of contrast by both kidneys. The urinary bladder is distended. There is diffuse thickened bladder wall likely related to inflammatory changes of the bowel within the pelvis. There is a focal area of loss of fat plane superior to the bladder with adhesion of the distal small bowel. There is a a focal area of mucosal retraction and beaking of the dome of the bladder (coronal series 4, image 45) which appears somewhat contiguous with a low attenuating channel which may represent a scarred or narrowed fistulous tract. Stomach/Bowel: Oral contrast opacifies the stomach and multiple loops of small bowel. There is no evidence of gastric outlet obstruction. There is inflammatory changes of the bowel within the pelvis with an area of tethering and adhesions of loops of small bowel (series 2, image 60) there is high grade narrowing of the terminal ileum in the right hemipelvis superior to the bladder with associated small-bowel obstruction. The distal small bowel loops measure up to approximately 4 cm in diameter. There is increase in the degree of obstruction and dilatation of the distal small bowel compared to the prior CT. Oral contrast terminates in the normal caliber distal small bowel within the pelvis and does not reach the terminal ileum or segments of dilated distal ileum. This may be related to timing of the contrast or obstruction related to adhesions. There is an approximately 10 cm strictured  segment of the terminal ileum (series 4, image 44, and 56). There are multiple areas of strictures in the distal ileum. There is mucosal enhancement of the dilated loops of distal small bowel suggestive of recurrent or flare up of Crohn's disease. No drainable fluid collection or abscess identified. There is moderate stool throughout the colon. No inflammatory changes of the colon. Vascular/Lymphatic: There is moderate aortoiliac atherosclerotic disease. The origins of the celiac axis, SMA, IMA appear patent. The SMV, splenic vein, and  main portal vein are patent. No portal venous gas identified. There is no adenopathy. Reproductive: Multiple mildly enlarged nabothian cysts noted. There is small amount of fluid within the endometrial canal. There is small amount of air within the vagina which may be introduced through the vaginal canal. However, an enterovaginal fistula is not entirely excluded. The ovaries are grossly unremarkable as visualized. Other: Mild diffuse subcutaneous stranding. Musculoskeletal: No acute or significant osseous findings. IMPRESSION: 1. Inflammatory changes of the distal small bowel with areas of adhesions and strictures and resulting obstruction. Overall there has been interval increase in the degree of obstruction compared to the prior study. There is mucosal enhancement of the dilated loops of small bowel suggestive of active disease or flare up of Crohn's. There is a approximately 10 cm stricture of the terminal ileum. No drainable fluid collection or abscess. 2. Adhesions of the distal small bowel to the bladder dome. A focal area retraction and beaking in the bladder dome appears to be contiguous with a narrow tract and may represent a scarred fistula. 3. Air within the vagina may be introduced from the vaginal canal. However, and enterovaginal fistula is not entirely excluded. Electronically Signed   By: Anner Crete M.D.   On: 01/30/2017 01:46     Subjective: Seen and  examined and was tired this AM but doing well. She was tolerating her diet and had bowel movements. Denied and SOB or CP. No abdominal Pain. Patient wanting to go home.  Discharge Exam: Vitals:   02/03/17 0518 02/03/17 1436  BP: (!) 138/99 (!) 160/96  Pulse: (!) 109 91  Resp: 18 18  Temp: 99.8 F (37.7 C) 99.1 F (37.3 C)   Vitals:   02/02/17 2049 02/02/17 2135 02/03/17 0518 02/03/17 1436  BP: (!) 156/102 (!) 151/100 (!) 138/99 (!) 160/96  Pulse: (!) 103  (!) 109 91  Resp: 14  18 18   Temp: 99.6 F (37.6 C)  99.8 F (37.7 C) 99.1 F (37.3 C)  TempSrc: Oral  Oral Oral  SpO2: 100%  96% 98%  Weight:      Height:       General: Pt is alert, awake, not in acute distress Cardiovascular: RRR, S1/S2 +, no rubs, no gallops Respiratory: CTA bilaterally, no wheezing, no rhonchi Abdominal: Soft, Mildly tender to palpate, ND, bowel sounds +; Abdominal Incision appeared C/D/I Extremities: no edema, no cyanosis  The results of significant diagnostics from this hospitalization (including imaging, microbiology, ancillary and laboratory) are listed below for reference.    Microbiology: Recent Results (from the past 240 hour(s))  Urine culture     Status: Abnormal   Collection Time: 01/29/17  8:03 PM  Result Value Ref Range Status   Specimen Description URINE, CLEAN CATCH  Final   Special Requests NONE  Final   Culture >=100,000 COLONIES/mL ESCHERICHIA COLI (A)  Final   Report Status 02/01/2017 FINAL  Final   Organism ID, Bacteria ESCHERICHIA COLI (A)  Final      Susceptibility   Escherichia coli - MIC*    AMPICILLIN <=2 SENSITIVE Sensitive     CEFAZOLIN <=4 SENSITIVE Sensitive     CEFTRIAXONE <=1 SENSITIVE Sensitive     CIPROFLOXACIN <=0.25 SENSITIVE Sensitive     GENTAMICIN <=1 SENSITIVE Sensitive     IMIPENEM <=0.25 SENSITIVE Sensitive     NITROFURANTOIN <=16 SENSITIVE Sensitive     TRIMETH/SULFA <=20 SENSITIVE Sensitive     AMPICILLIN/SULBACTAM <=2 SENSITIVE Sensitive      PIP/TAZO <=4 SENSITIVE Sensitive  Extended ESBL NEGATIVE Sensitive     * >=100,000 COLONIES/mL ESCHERICHIA COLI  Surgical PCR screen     Status: None   Collection Time: 01/31/17  8:09 AM  Result Value Ref Range Status   MRSA, PCR NEGATIVE NEGATIVE Final   Staphylococcus aureus NEGATIVE NEGATIVE Final    Comment:        The Xpert SA Assay (FDA approved for NASAL specimens in patients over 15 years of age), is one component of a comprehensive surveillance program.  Test performance has been validated by [facility] for patients greater than or equal to 76 year old. It is not intended to diagnose infection nor to guide or monitor treatment.     Labs: BNP (last 3 results) No results for input(s): BNP in the last 8760 hours. Basic Metabolic Panel:  Recent Labs Lab 01/30/17 0506 01/31/17 0537 02/01/17 0523 02/02/17 0531 02/03/17 0534  NA 137 138 133* 133* 134*  K 3.6 4.0 4.0 3.9 3.9  CL 102 108 97* 100* 102  CO2 24 22 26 23 25   GLUCOSE 85 93 135* 85 80  BUN 6 9 5* <5* 7  CREATININE 0.57 0.62 0.66 0.61 0.60  CALCIUM 8.8* 8.6* 8.7* 8.5* 8.3*  MG 2.2  --   --  1.7 1.8  PHOS  --   --   --  2.6 1.9*   Liver Function Tests:  Recent Labs Lab 01/29/17 2004 02/02/17 0531 02/03/17 0534  AST 15 15 14*  ALT 13* 12* 11*  ALKPHOS 62 55 52  BILITOT 0.3 0.5 0.7  PROT 6.9 6.4* 5.9*  ALBUMIN 3.4* 3.0* 2.6*    Recent Labs Lab 01/29/17 2004  LIPASE 10*   No results for input(s): AMMONIA in the last 168 hours. CBC:  Recent Labs Lab 01/30/17 0506 01/31/17 0537 02/01/17 0523 02/02/17 0531 02/03/17 0534  WBC 7.9 6.4 15.1* 12.1* 12.1*  NEUTROABS 6.7 4.5  --  9.9* 10.2*  HGB 11.9* 9.6* 11.6* 10.5* 9.7*  HCT 36.3 29.7* 35.8* 32.6* 28.7*  MCV 79.3 80.7 80.3 79.9 79.3  PLT 273 251 293 263 237   Cardiac Enzymes: No results for input(s): CKTOTAL, CKMB, CKMBINDEX, TROPONINI in the last 168 hours. BNP: Invalid input(s): POCBNP CBG: No results for input(s): GLUCAP in  the last 168 hours. D-Dimer No results for input(s): DDIMER in the last 72 hours. Hgb A1c No results for input(s): HGBA1C in the last 72 hours. Lipid Profile No results for input(s): CHOL, HDL, LDLCALC, TRIG, CHOLHDL, LDLDIRECT in the last 72 hours. Thyroid function studies No results for input(s): TSH, T4TOTAL, T3FREE, THYROIDAB in the last 72 hours.  Invalid input(s): FREET3 Anemia work up No results for input(s): VITAMINB12, FOLATE, FERRITIN, TIBC, IRON, RETICCTPCT in the last 72 hours. Urinalysis    Component Value Date/Time   COLORURINE YELLOW 01/29/2017 2003   APPEARANCEUR CLEAR 01/29/2017 2003   LABSPEC 1.011 01/29/2017 2003   PHURINE 7.0 01/29/2017 2003   GLUCOSEU NEGATIVE 01/29/2017 2003   HGBUR SMALL (A) 01/29/2017 2003   HGBUR negative 05/25/2010 1611   BILIRUBINUR NEGATIVE 01/29/2017 2003   KETONESUR 20 (A) 01/29/2017 2003   PROTEINUR NEGATIVE 01/29/2017 2003   UROBILINOGEN 1.0 10/19/2016 1308   NITRITE POSITIVE (A) 01/29/2017 2003   LEUKOCYTESUR SMALL (A) 01/29/2017 2003   Sepsis Labs Invalid input(s): PROCALCITONIN,  WBC,  LACTICIDVEN Microbiology Recent Results (from the past 240 hour(s))  Urine culture     Status: Abnormal   Collection Time: 01/29/17  8:03 PM  Result Value  Ref Range Status   Specimen Description URINE, CLEAN CATCH  Final   Special Requests NONE  Final   Culture >=100,000 COLONIES/mL ESCHERICHIA COLI (A)  Final   Report Status 02/01/2017 FINAL  Final   Organism ID, Bacteria ESCHERICHIA COLI (A)  Final      Susceptibility   Escherichia coli - MIC*    AMPICILLIN <=2 SENSITIVE Sensitive     CEFAZOLIN <=4 SENSITIVE Sensitive     CEFTRIAXONE <=1 SENSITIVE Sensitive     CIPROFLOXACIN <=0.25 SENSITIVE Sensitive     GENTAMICIN <=1 SENSITIVE Sensitive     IMIPENEM <=0.25 SENSITIVE Sensitive     NITROFURANTOIN <=16 SENSITIVE Sensitive     TRIMETH/SULFA <=20 SENSITIVE Sensitive     AMPICILLIN/SULBACTAM <=2 SENSITIVE Sensitive     PIP/TAZO <=4  SENSITIVE Sensitive     Extended ESBL NEGATIVE Sensitive     * >=100,000 COLONIES/mL ESCHERICHIA COLI  Surgical PCR screen     Status: None   Collection Time: 01/31/17  8:09 AM  Result Value Ref Range Status   MRSA, PCR NEGATIVE NEGATIVE Final   Staphylococcus aureus NEGATIVE NEGATIVE Final    Comment:        The Xpert SA Assay (FDA approved for NASAL specimens in patients over 16 years of age), is one component of a comprehensive surveillance program.  Test performance has been validated by [facility] for patients greater than or equal to 35 year old. It is not intended to diagnose infection nor to guide or monitor treatment.    Time coordinating discharge: 35 minutes  SIGNED:  Kerney Elbe, DO [facility] 02/03/2017, 4:07 PM Pager [phone]  If 7PM-7AM, please contact night-coverage [email or web address] Password [email or web address]

## 2017-02-03 NOTE — Progress Notes (Signed)
Discharged to home via family car. Patient reports that she understands discharge instructions, Patient received written GO'V

## 2017-02-03 NOTE — Progress Notes (Signed)
Discharge planning, no HH needs identified. Patient to f/u with GI and PCP at d/c, has transportation available. [phone]

## 2017-02-16 ENCOUNTER — Inpatient Hospital Stay: Admit: 2017-02-16 | Payer: Medicaid Other | Admitting: General Surgery

## 2017-02-16 SURGERY — LAPAROSCOPIC PARTIAL COLECTOMY
Anesthesia: General

## 2017-03-03 ENCOUNTER — Telehealth: Payer: Self-pay | Admitting: Gastroenterology

## 2017-03-03 ENCOUNTER — Other Ambulatory Visit: Payer: Self-pay

## 2017-03-03 MED ORDER — ONDANSETRON HCL 4 MG PO TABS: 4.0000 mg | ORAL_TABLET | Freq: Three times a day (TID) | ORAL | 1 refills | Status: DC | PRN

## 2017-03-03 NOTE — Telephone Encounter (Signed)
Follow up scheduled for 03-14-2017

## 2017-03-03 NOTE — Telephone Encounter (Signed)
Rx reordered.

## 2017-03-14 ENCOUNTER — Ambulatory Visit (INDEPENDENT_AMBULATORY_CARE_PROVIDER_SITE_OTHER): Payer: Medicaid Other | Admitting: Gastroenterology

## 2017-03-14 ENCOUNTER — Encounter: Payer: Self-pay | Admitting: Gastroenterology

## 2017-03-14 VITALS — BP 124/80 | HR 82 | Ht 64.0 in | Wt 122.0 lb

## 2017-03-14 DIAGNOSIS — K50012 Crohn's disease of small intestine with intestinal obstruction: Secondary | ICD-10-CM

## 2017-03-14 DIAGNOSIS — E440 Moderate protein-calorie malnutrition: Secondary | ICD-10-CM | POA: Diagnosis not present

## 2017-03-14 DIAGNOSIS — R197 Diarrhea, unspecified: Secondary | ICD-10-CM

## 2017-03-14 DIAGNOSIS — R110 Nausea: Secondary | ICD-10-CM | POA: Diagnosis not present

## 2017-03-14 NOTE — Progress Notes (Signed)
     [facility] Progress Note  Chief Complaint: Crohn's ileitis with small bowel obstruction  Subjective  History:  Early in follows up after her hospital stay last month, when she was admitted for worsening obstructive symptoms from small bowel Crohn's. She underwent a resection and primary reanastomosis by Dr. gross. She had severe inflammatory and internally fistulizing disease at the time of surgery. She is slowly recovering, she has intermittent nausea but no vomiting or obstructive symptoms. She denies abdominal distention and bloating as before. She tends to have 1 or 2 BMs per day that are variable form. There is no rectal bleeding. Her appetite is fair and she has not yet been able to gain much weight.  ROS: Cardiovascular:  no chest pain Respiratory: no dyspnea Remainder systems negative except as in history The patient's Past Medical, Family and Social History were reviewed and are on file in the EMR.  Objective:  Med list reviewed  Vital signs in last 24 hrs: Vitals:   03/14/17 0941  BP: 124/80  Pulse: 82    Physical Exam  She has fair muscle mass as before, and also continues to have a somewhat depressed affect  HEENT: sclera anicteric, oral mucosa moist without lesions  Neck: supple, no thyromegaly, JVD or lymphadenopathy  Cardiac: RRR without murmurs, S1S2 heard, no peripheral edema  Pulm: clear to auscultation bilaterally, normal RR and effort noted  Abdomen: soft, no tenderness, with active bowel sounds. No guarding or palpable hepatosplenomegaly. Well healed laparoscopic incisions   Skin; warm and dry, no jaundice or rash  I reviewed the operative report and the presurgical CT scan images   @ASSESSMENTPLANBEGIN @ Assessment: Encounter Diagnoses  Name Primary?  . Crohn's disease of small intestine with intestinal obstruction ([place]) Yes  . Diarrhea, unspecified type   . Nausea without vomiting   . Moderate malnutrition ([place])    She is slowly  recovering from surgery and I encouraged her to take protein calorie supplements in an effort to gain some weight. She has also lately been seeing a therapist, and we'll get [place] the name and contact information so I can send them my note. Also, I think it may be worth them considering trying an antidepressant, and perhaps something that might stimulate her appetite such as Remeron.  Plan: Suriya will see me in 2 months, at which time we can discuss repeat colonoscopy to assess disease activity and decide about her therapy going forward. She did not respond to Humira or Entyvio, and I am not certain if Medicaid will cover a biologic such as Stelara (since they would not cover it before surgery).  I will also contact a colleague at [facility] and see if Kerline might be a candidate for a clinical trial.   Total time 25 minutes, over half spent in counseling and coordination of care.   [name]

## 2017-03-14 NOTE — Patient Instructions (Addendum)
Please discuss a possible antidepressant medicine with your therapist.  (?remeron - it might also stimulate appetite)  If you are age 49 or older, your body mass index should be between 23-30. Your Body mass index is 20.94 kg/m. If this is out of the aforementioned range listed, please consider follow up with your Primary Care Provider.  If you are age 66 or younger, your body mass index should be between 19-25. Your Body mass index is 20.94 kg/m. If this is out of the aformentioned range listed, please consider follow up with your Primary Care Provider.   Thank you for choosing [facility]  Dr Wilfrid Lund III

## 2017-05-10 ENCOUNTER — Encounter: Payer: Self-pay | Admitting: Gastroenterology

## 2017-05-10 ENCOUNTER — Other Ambulatory Visit (INDEPENDENT_AMBULATORY_CARE_PROVIDER_SITE_OTHER): Payer: Medicaid Other

## 2017-05-10 ENCOUNTER — Ambulatory Visit (INDEPENDENT_AMBULATORY_CARE_PROVIDER_SITE_OTHER): Payer: Medicaid Other | Admitting: Gastroenterology

## 2017-05-10 VITALS — BP 114/80 | HR 68 | Ht 64.0 in | Wt 135.2 lb

## 2017-05-10 DIAGNOSIS — R634 Abnormal weight loss: Secondary | ICD-10-CM | POA: Diagnosis not present

## 2017-05-10 DIAGNOSIS — R110 Nausea: Secondary | ICD-10-CM | POA: Diagnosis not present

## 2017-05-10 DIAGNOSIS — K50012 Crohn's disease of small intestine with intestinal obstruction: Secondary | ICD-10-CM

## 2017-05-10 DIAGNOSIS — D500 Iron deficiency anemia secondary to blood loss (chronic): Secondary | ICD-10-CM

## 2017-05-10 DIAGNOSIS — R12 Heartburn: Secondary | ICD-10-CM | POA: Diagnosis not present

## 2017-05-10 DIAGNOSIS — Z23 Encounter for immunization: Secondary | ICD-10-CM

## 2017-05-10 LAB — CBC WITH DIFFERENTIAL/PLATELET
BASOS ABS: 0 10*3/uL (ref 0.0–0.1)
Basophils Relative: 0.5 % (ref 0.0–3.0)
EOS PCT: 2.2 % (ref 0.0–5.0)
Eosinophils Absolute: 0.1 10*3/uL (ref 0.0–0.7)
HCT: 38.4 % (ref 36.0–46.0)
HEMOGLOBIN: 12.2 g/dL (ref 12.0–15.0)
LYMPHS ABS: 1.6 10*3/uL (ref 0.7–4.0)
Lymphocytes Relative: 32.8 % (ref 12.0–46.0)
MCHC: 31.9 g/dL (ref 30.0–36.0)
MCV: 84.2 fl (ref 78.0–100.0)
MONO ABS: 0.4 10*3/uL (ref 0.1–1.0)
Monocytes Relative: 7.5 % (ref 3.0–12.0)
NEUTROS PCT: 57 % (ref 43.0–77.0)
Neutro Abs: 2.8 10*3/uL (ref 1.4–7.7)
Platelets: 175 10*3/uL (ref 150.0–400.0)
RBC: 4.56 Mil/uL (ref 3.87–5.11)
RDW: 17.9 % — ABNORMAL HIGH (ref 11.5–15.5)
WBC: 4.9 10*3/uL (ref 4.0–10.5)

## 2017-05-10 LAB — B12 AND FOLATE PANEL
Folate: 18.7 ng/mL (ref >5.9)
VITAMIN B 12: 279 pg/mL (ref 211–911)

## 2017-05-10 LAB — FERRITIN: FERRITIN: 15.7 ng/mL (ref 10.0–291.0)

## 2017-05-10 LAB — IBC PANEL
IRON: 65 ug/dL (ref 42–145)
SATURATION RATIOS: 17.9 % — AB (ref 20.0–50.0)
TRANSFERRIN: 260 mg/dL (ref 212.0–360.0)

## 2017-05-10 NOTE — Progress Notes (Signed)
[facility] Progress Note  Chief Complaint: Small bowel Crohn's disease  Subjective  History:  Marissa Blankenship follows up for the first time in 2 months. She has fully recovered from her Crohn's resection, and is feeling much better. She has only occasional brief right lower quadrant pain, and her nausea is almost completely resolved. She denies vomiting, her appetite is improved, and she has put on 13 pounds since the last visit. She denies diarrhea or rectal bleeding. Marissa Blankenship continues to take iron nearly every day, and last blood counts were checked in July.  She is having more frequent heartburn several times per week, not clear if there are certain food triggers. She denies dysphagia or odynophagia.  She denies painful eye redness, arthralgias or rash. ROS: Cardiovascular:  no chest pain Respiratory: no dyspnea Still with dysphoric mood, has been seeing a Social worker. Occasional back pain Remainder systems negative except as above The patient's Past Medical, Family and Social History were reviewed and are on file in the EMR.  Objective:  Med list reviewed  Current Outpatient Prescriptions:  .  acetaminophen-codeine (TYLENOL #3) 300-30 MG tablet, Take 1-2 tablets by mouth every 6 (six) hours as needed for moderate pain., Disp: 60 tablet, Rfl: 0 .  amLODipine (NORVASC) 10 MG tablet, Take 10 mg by mouth daily after breakfast. , Disp: , Rfl: 3 .  calcium-vitamin D (OSCAL WITH D) 500-200 MG-UNIT tablet, Take 1 tablet by mouth 2 (two) times daily., Disp: 60 tablet, Rfl: 6 .  dicyclomine (BENTYL) 20 MG tablet, Take 1 tablet (20 mg total) by mouth 3 (three) times daily as needed (for abdominal pain.). (Patient taking differently: Take 20 mg by mouth 3 (three) times daily. ), Disp: 60 tablet, Rfl: 3 .  ferrous sulfate 325 (65 FE) MG tablet, Take 1 tablet (325 mg total) by mouth 2 (two) times daily with a meal., Disp: 60 tablet, Rfl: 3 .  lip balm (CARMEX) ointment, Apply 1 application  topically 2 (two) times daily., Disp: 7 g, Rfl: 0 .  methocarbamol (ROBAXIN) 500 MG tablet, Take 1 tablet (500 mg total) by mouth every 6 (six) hours as needed for muscle spasms., Disp: 10 tablet, Rfl: 0 .  ondansetron (ZOFRAN) 4 MG tablet, Take 1 tablet (4 mg total) by mouth every 8 (eight) hours as needed for nausea or vomiting., Disp: 30 tablet, Rfl: 1 .  simethicone (MYLICON) 80 MG chewable tablet, Chew 80 mg by mouth every 6 (six) hours as needed for flatulence., Disp: , Rfl:    Vital signs in last 24 hrs: Vitals:   05/10/17 0953  BP: 114/80  Pulse: 68    Physical Exam  She certainly looks better than before, has put on some muscle mass in her affect is brighter.  HEENT: sclera anicteric, oral mucosa moist without lesions  Neck: supple, no thyromegaly, JVD or lymphadenopathy  Cardiac: RRR without murmurs, S1S2 heard, no peripheral edema  Pulm: clear to auscultation bilaterally, normal RR and effort noted  Abdomen: soft, mild RLQ tenderness, with active bowel sounds. No guarding or palpable hepatosplenomegaly.  Skin; warm and dry, no jaundice or rash    @ASSESSMENTPLANBEGIN @ Assessment: Encounter Diagnoses  Name Primary?  . Crohn's disease of small intestine with intestinal obstruction ([place]) Yes  . Heartburn   . Iron deficiency anemia due to chronic blood loss   . Nausea without vomiting   . Abnormal loss of weight    She is much improved overall. Persistent heartburn without red flag symptoms. Still  on iron for anemia, needs recheck.  We had another discussion regarding the long-term management of Crohn's. She is on no current therapy, and I think she needs a follow-up colonoscopy in the next few months to assess any recurrence of Crohn's and make plans for long-term management. She would like to wait until after the first of the year.  Plan:  Flu shot given today We will call her next month when the January endoscopy scheduled is available. Labs today for CBC,  iron levels and B12  Total time 25 minutes, over half spent in counseling and coordination of care.   [name]

## 2017-05-10 NOTE — Patient Instructions (Addendum)
Generic "pepcid complete" as needed for heartburn  Labs today  We will call you next month to schedule a January colonoscopy  If you are age 49 or older, your body mass index should be between 23-30. Your Body mass index is 23.21 kg/m. If this is out of the aforementioned range listed, please consider follow up with your Primary Care Provider.  If you are age 105 or younger, your body mass index should be between 19-25. Your Body mass index is 23.21 kg/m. If this is out of the aformentioned range listed, please consider follow up with your Primary Care Provider.   You have been given your flu shot today.  Thank you.

## 2017-05-23 ENCOUNTER — Other Ambulatory Visit: Payer: Self-pay

## 2017-05-23 DIAGNOSIS — K50119 Crohn's disease of large intestine with unspecified complications: Secondary | ICD-10-CM

## 2017-05-23 MED ORDER — NA SULFATE-K SULFATE-MG SULF 17.5-3.13-1.6 GM/177ML PO SOLN: 1.0000 | Freq: Once | ORAL | 0 refills | Status: AC

## 2017-07-25 ENCOUNTER — Telehealth: Payer: Self-pay | Admitting: Gastroenterology

## 2017-07-25 NOTE — Telephone Encounter (Signed)
Pt. aware to come by the office to sign a consent form for her colonoscopy on 07-31-2017.

## 2017-07-31 ENCOUNTER — Encounter: Payer: Medicaid Other | Admitting: Gastroenterology

## 2017-08-10 ENCOUNTER — Telehealth: Payer: Self-pay | Admitting: Gastroenterology

## 2017-08-10 NOTE — Telephone Encounter (Signed)
Reprinted prep instructions, patient did have some beans yesterday. Verbally instructed to not eat anything else like beans, corn etc. She will pick up these instructions tomorrow.

## 2017-08-14 ENCOUNTER — Ambulatory Visit (AMBULATORY_SURGERY_CENTER): Payer: Medicaid Other | Admitting: Gastroenterology

## 2017-08-14 ENCOUNTER — Encounter: Payer: Self-pay | Admitting: Gastroenterology

## 2017-08-14 VITALS — BP 110/60 | HR 58 | Temp 98.4°F | Resp 12 | Ht 64.0 in | Wt 135.0 lb

## 2017-08-14 DIAGNOSIS — K5000 Crohn's disease of small intestine without complications: Secondary | ICD-10-CM

## 2017-08-14 MED ORDER — SODIUM CHLORIDE 0.9 % IV SOLN: 500.0000 mL | Freq: Once | INTRAVENOUS | Status: DC

## 2017-08-14 NOTE — Op Note (Signed)
[facility] Patient Name: Marissa Marissa Procedure Date: 08/14/2017 9:21 AM MRN: [id number] Endoscopist: Pinon. Loletha Carrow , MD Age: 52 Referring MD:  Date of Birth: 05-15-1966 Gender: Female Account #: [id number] Procedure:                Colonoscopy Indications:              Follow-up of Crohn's disease of the small bowel Medicines:                Monitored Anesthesia Care Procedure:                Pre-Anesthesia Assessment:                           - Prior to the procedure, a History and Physical                            was performed, and patient medications and                            allergies were reviewed. The patient's tolerance of                            previous anesthesia was also reviewed. The risks                            and benefits of the procedure and the sedation                            options and risks were discussed with the patient.                            All questions were answered, and informed consent                            was obtained. Prior Anticoagulants: The patient has                            taken no previous anticoagulant or antiplatelet                            agents. ASA Grade Assessment: II - A patient with                            mild systemic disease. After reviewing the risks                            and benefits, the patient was deemed in                            satisfactory condition to undergo the procedure.                           After obtaining informed consent, the colonoscope  was passed under direct vision. Throughout the                            procedure, the patient's blood pressure, pulse, and                            oxygen saturations were monitored continuously. The                            Colonoscope was introduced through the anus and                            advanced to the the terminal ileum. The colonoscopy                            was performed  without difficulty. The patient                            tolerated the procedure well. The quality of the                            bowel preparation was excellent. The rectum and                            Neo-terminal ileum were photographed. Scope In: 9:39:18 AM Scope Out: 9:51:05 AM Total Procedure Duration: 0 hours 11 minutes 47 seconds  Findings:                 The perianal and digital rectal examinations were                            normal. Specifically, no fistula was seen.                           The neo-terminal ileum contained a single                            (solitary) four mm ulcer.                           There was evidence of a prior side-to-side                            ileo-colonic anastomosis in the distal ascending                            colon. This was patent and was characterized by                            ulceration. The anastomosis was traversed without                            difficulty.  The exam was otherwise without abnormality on                            direct and retroflexion views. Complications:            No immediate complications. Estimated Blood Loss:     Estimated blood loss: none. Impression:               - A single (solitary) ulcer in the neo-terminal                            ileum.                           - Patent side-to-side ileo-colonic anastomosis,                            characterized by ulceration.                           - The examination was otherwise normal on direct                            and retroflexion views.                           - No specimens collected. Recommendation:           - Patient has a contact number available for                            emergencies. The signs and symptoms of potential                            delayed complications were discussed with the                            patient. Return to normal activities tomorrow.                             Written discharge instructions were provided to the                            patient.                           - Resume previous diet.                           - Continue present medications.                           - No recommendation at this time regarding repeat                            colonoscopy.                           -  Return to my office at appointment to be                            scheduled after insurance approval process started                            for Stelara. Thy Marissa L. Loletha Carrow, MD 08/14/2017 10:07:23 AM This report has been signed electronically.

## 2017-08-14 NOTE — Progress Notes (Signed)
To recovery, report to RN, VSS. 

## 2017-08-14 NOTE — Progress Notes (Signed)
Pt's states no medical or surgical changes since previsit or office visit. 

## 2017-08-14 NOTE — Patient Instructions (Signed)
YOU HAD AN ENDOSCOPIC PROCEDURE TODAY AT [facility]:   Refer to the procedure report that was given to you for any specific questions about what was found during the examination.  If the procedure report does not answer your questions, please call your gastroenterologist to clarify.  If you requested that your care partner not be given the details of your procedure findings, then the procedure report has been included in a sealed envelope for you to review at your convenience later.  YOU SHOULD EXPECT: Some feelings of bloating in the abdomen. Passage of more gas than usual.  Walking can help get rid of the air that was put into your GI tract during the procedure and reduce the bloating. If you had a lower endoscopy (such as a colonoscopy or flexible sigmoidoscopy) you may notice spotting of blood in your stool or on the toilet paper. If you underwent a bowel prep for your procedure, you may not have a normal bowel movement for a few days.  Please Note:  You might notice some irritation and congestion in your nose or some drainage.  This is from the oxygen used during your procedure.  There is no need for concern and it should clear up in a day or so.  SYMPTOMS TO REPORT IMMEDIATELY:   Following lower endoscopy (colonoscopy or flexible sigmoidoscopy):  Excessive amounts of blood in the stool  Significant tenderness or worsening of abdominal pains  Swelling of the abdomen that is new, acute  Fever of 100F or higher   For urgent or emergent issues, a gastroenterologist can be reached at any hour by calling [phone].   DIET:  We do recommend a small meal at first, but then you may proceed to your regular diet.  Drink plenty of fluids but you should avoid alcoholic beverages for 24 hours.  ACTIVITY:  You should plan to take it easy for the rest of today and you should NOT DRIVE or use heavy machinery until tomorrow (because of the sedation medicines used during the test).     FOLLOW UP: Our staff will call the number listed on your records the next business day following your procedure to check on you and address any questions or concerns that you may have regarding the information given to you following your procedure. If we do not reach you, we will leave a message.  However, if you are feeling well and you are not experiencing any problems, there is no need to return our call.  We will assume that you have returned to your regular daily activities without incident.  If any biopsies were taken you will be contacted by phone or by letter within the next 1-3 weeks.  Please call us at [phone] if you have not heard about the biopsies in 3 weeks.    SIGNATURES/CONFIDENTIALITY: You and/or your care partner have signed paperwork which will be entered into your electronic medical record.  These signatures attest to the fact that that the information above on your After Visit Summary has been reviewed and is understood.  Full responsibility of the confidentiality of this discharge information lies with you and/or your care-partner.   Handout was given to your care partner on Crohn's.  You may resume your current medications today. Please call if any questions or concerns.

## 2017-08-14 NOTE — Progress Notes (Signed)
No problems noted in the recovery room. maw 

## 2017-08-15 ENCOUNTER — Telehealth: Payer: Self-pay

## 2017-08-15 ENCOUNTER — Telehealth: Payer: Self-pay | Admitting: Gastroenterology

## 2017-08-15 NOTE — Telephone Encounter (Signed)
  Follow up Call-  Call back number 08/14/2017 05/25/2016  Post procedure Call Back phone  # [phone]  Permission to leave phone message Yes Yes  Some recent data might be hidden     Patient questions:  Do you have a fever, pain , or abdominal swelling? No. Pain Score  0 *  Have you tolerated food without any problems? Yes.    Have you been able to return to your normal activities? Yes.    Do you have any questions about your discharge instructions: Diet   No. Medications  No. Follow up visit  No.  Do you have questions or concerns about your Care? No.  Actions: * If pain score is 4 or above: No action needed, pain <4.

## 2017-08-15 NOTE — Telephone Encounter (Signed)
Please start working on approval for Stelara for Moya's small bowel Crohn's. She required surgery last summer, and prior to that failed Humira and Entyvio.

## 2017-08-16 NOTE — Telephone Encounter (Signed)
She is 61kg, so the induction dose is 390 mg IV, and maintenance dosing is 90 mg SQ every 8 weeks.

## 2017-08-16 NOTE — Telephone Encounter (Signed)
Working on this prior auth, what induction dose do you want for the Stelara: 260 mg, 390 mg or 520 mg IV infusion.  Then the maintenance dose is 90 mg every 8 weeks. Two prior authorizations need to be submitted. Thanks.

## 2017-09-19 ENCOUNTER — Ambulatory Visit: Payer: Medicaid Other | Admitting: Family Medicine

## 2017-09-19 ENCOUNTER — Other Ambulatory Visit: Payer: Self-pay

## 2017-09-19 ENCOUNTER — Encounter: Payer: Self-pay | Admitting: Family Medicine

## 2017-09-19 VITALS — BP 128/82 | HR 75 | Temp 97.9°F | Ht 64.0 in | Wt 159.0 lb

## 2017-09-19 DIAGNOSIS — H6121 Impacted cerumen, right ear: Secondary | ICD-10-CM | POA: Diagnosis not present

## 2017-09-19 DIAGNOSIS — K50919 Crohn's disease, unspecified, with unspecified complications: Secondary | ICD-10-CM | POA: Insufficient documentation

## 2017-09-19 DIAGNOSIS — I10 Essential (primary) hypertension: Secondary | ICD-10-CM

## 2017-09-19 NOTE — Patient Instructions (Signed)
It was very nice to meet you today. We will inform you of your lab results either by phone or letter. If you have questions or concerns please do not hesitate to call at [phone].  Lucila Maine, DO PGY-2, [facility] 09/19/2017 8:59 AM   Health Maintenance, Female Adopting a healthy lifestyle and getting preventive care can go a long way to promote health and wellness. Talk with your health care provider about what schedule of regular examinations is right for you. This is a good chance for you to check in with your provider about disease prevention and staying healthy. In between checkups, there are plenty of things you can do on your own. Experts have done a lot of research about which lifestyle changes and preventive measures are most likely to keep you healthy. Ask your health care provider for more information. Weight and diet Eat a healthy diet  Be sure to include plenty of vegetables, fruits, low-fat dairy products, and lean protein.  Do not eat a lot of foods high in solid fats, added sugars, or salt.  Get regular exercise. This is one of the most important things you can do for your health. ? Most adults should exercise for at least 150 minutes each week. The exercise should increase your heart rate and make you sweat (moderate-intensity exercise). ? Most adults should also do strengthening exercises at least twice a week. This is in addition to the moderate-intensity exercise.  Maintain a healthy weight  Body mass index (BMI) is a measurement that can be used to identify possible weight problems. It estimates body fat based on height and weight. Your health care provider can help determine your BMI and help you achieve or maintain a healthy weight.  For females 61 years of age and older: ? A BMI below 18.5 is considered underweight. ? A BMI of 18.5 to 24.9 is normal. ? A BMI of 25 to 29.9 is considered overweight. ? A BMI of 30 and above is considered  obese.  Watch levels of cholesterol and blood lipids  You should start having your blood tested for lipids and cholesterol at 50 years of age, then have this test every 5 years.  You may need to have your cholesterol levels checked more often if: ? Your lipid or cholesterol levels are high. ? You are older than 50 years of age. ? You are at high risk for heart disease.  Cancer screening Lung Cancer  Lung cancer screening is recommended for adults 109-65 years old who are at high risk for lung cancer because of a history of smoking.  A yearly low-dose CT scan of the lungs is recommended for people who: ? Currently smoke. ? Have quit within the past 15 years. ? Have at least a 30-pack-year history of smoking. A pack year is smoking an average of one pack of cigarettes a day for 1 year.  Yearly screening should continue until it has been 15 years since you quit.  Yearly screening should stop if you develop a health problem that would prevent you from having lung cancer treatment.  Breast Cancer  Practice breast self-awareness. This means understanding how your breasts normally appear and feel.  It also means doing regular breast self-exams. Let your health care provider know about any changes, no matter how small.  If you are in your 20s or 30s, you should have a clinical breast exam (CBE) by a health care provider every 1-3 years as part of a  regular health exam.  If you are 40 or older, have a CBE every year. Also consider having a breast X-ray (mammogram) every year.  If you have a family history of breast cancer, talk to your health care provider about genetic screening.  If you are at high risk for breast cancer, talk to your health care provider about having an MRI and a mammogram every year.  Breast cancer gene (BRCA) assessment is recommended for women who have family members with BRCA-related cancers. BRCA-related cancers  include: ? Breast. ? Ovarian. ? Tubal. ? Peritoneal cancers.  Results of the assessment will determine the need for genetic counseling and BRCA1 and BRCA2 testing.  Cervical Cancer Your health care provider may recommend that you be screened regularly for cancer of the pelvic organs (ovaries, uterus, and vagina). This screening involves a pelvic examination, including checking for microscopic changes to the surface of your cervix (Pap test). You may be encouraged to have this screening done every 3 years, beginning at age 74.  For women ages 34-65, health care providers may recommend pelvic exams and Pap testing every 3 years, or they may recommend the Pap and pelvic exam, combined with testing for human papilloma virus (HPV), every 5 years. Some types of HPV increase your risk of cervical cancer. Testing for HPV may also be done on women of any age with unclear Pap test results.  Other health care providers may not recommend any screening for nonpregnant women who are considered low risk for pelvic cancer and who do not have symptoms. Ask your health care provider if a screening pelvic exam is right for you.  If you have had past treatment for cervical cancer or a condition that could lead to cancer, you need Pap tests and screening for cancer for at least 20 years after your treatment. If Pap tests have been discontinued, your risk factors (such as having a new sexual partner) need to be reassessed to determine if screening should resume. Some women have medical problems that increase the chance of getting cervical cancer. In these cases, your health care provider may recommend more frequent screening and Pap tests.  Colorectal Cancer  This type of cancer can be detected and often prevented.  Routine colorectal cancer screening usually begins at 50 years of age and continues through 50 years of age.  Your health care provider may recommend screening at an earlier age if you have risk factors  for colon cancer.  Your health care provider may also recommend using home test kits to check for hidden blood in the stool.  A small camera at the end of a tube can be used to examine your colon directly (sigmoidoscopy or colonoscopy). This is done to check for the earliest forms of colorectal cancer.  Routine screening usually begins at age 70.  Direct examination of the colon should be repeated every 5-10 years through 50 years of age. However, you may need to be screened more often if early forms of precancerous polyps or small growths are found.  Skin Cancer  Check your skin from head to toe regularly.  Tell your health care provider about any new moles or changes in moles, especially if there is a change in a mole's shape or color.  Also tell your health care provider if you have a mole that is larger than the size of a pencil eraser.  Always use sunscreen. Apply sunscreen liberally and repeatedly throughout the day.  Protect yourself by wearing long sleeves, pants,  a wide-brimmed hat, and sunglasses whenever you are outside.  Heart disease, diabetes, and high blood pressure  High blood pressure causes heart disease and increases the risk of stroke. High blood pressure is more likely to develop in: ? People who have blood pressure in the high end of the normal range (130-139/85-89 mm Hg). ? People who are overweight or obese. ? People who are African American.  If you are 82-70 years of age, have your blood pressure checked every 3-5 years. If you are 56 years of age or older, have your blood pressure checked every year. You should have your blood pressure measured twice-once when you are at a hospital or clinic, and once when you are not at a hospital or clinic. Record the average of the two measurements. To check your blood pressure when you are not at a hospital or clinic, you can use: ? An automated blood pressure machine at a pharmacy. ? A home blood pressure monitor.  If  you are between 62 years and 4 years old, ask your health care provider if you should take aspirin to prevent strokes.  Have regular diabetes screenings. This involves taking a blood sample to check your fasting blood sugar level. ? If you are at a normal weight and have a low risk for diabetes, have this test once every three years after 50 years of age. ? If you are overweight and have a high risk for diabetes, consider being tested at a younger age or more often. Preventing infection Hepatitis B  If you have a higher risk for hepatitis B, you should be screened for this virus. You are considered at high risk for hepatitis B if: ? You were born in a country where hepatitis B is common. Ask your health care provider which countries are considered high risk. ? Your parents were born in a high-risk country, and you have not been immunized against hepatitis B (hepatitis B vaccine). ? You have HIV or AIDS. ? You use needles to inject street drugs. ? You live with someone who has hepatitis B. ? You have had sex with someone who has hepatitis B. ? You get hemodialysis treatment. ? You take certain medicines for conditions, including cancer, organ transplantation, and autoimmune conditions.  Hepatitis C  Blood testing is recommended for: ? Everyone born from [birth year] through 1963. ? Anyone with known risk factors for hepatitis C.  Sexually transmitted infections (STIs)  You should be screened for sexually transmitted infections (STIs) including gonorrhea and chlamydia if: ? You are sexually active and are younger than 50 years of age. ? You are older than 50 years of age and your health care provider tells you that you are at risk for this type of infection. ? Your sexual activity has changed since you were last screened and you are at an increased risk for chlamydia or gonorrhea. Ask your health care provider if you are at risk.  If you do not have HIV, but are at risk, it may be recommended  that you take a prescription medicine daily to prevent HIV infection. This is called pre-exposure prophylaxis (PrEP). You are considered at risk if: ? You are sexually active and do not regularly use condoms or know the HIV status of your partner(s). ? You take drugs by injection. ? You are sexually active with a partner who has HIV.  Talk with your health care provider about whether you are at high risk of being infected with HIV. If you choose  to begin PrEP, you should first be tested for HIV. You should then be tested every 3 months for as long as you are taking PrEP. Pregnancy  If you are premenopausal and you may become pregnant, ask your health care provider about preconception counseling.  If you may become pregnant, take 400 to 800 micrograms (mcg) of folic acid every day.  If you want to prevent pregnancy, talk to your health care provider about birth control (contraception). Osteoporosis and menopause  Osteoporosis is a disease in which the bones lose minerals and strength with aging. This can result in serious bone fractures. Your risk for osteoporosis can be identified using a bone density scan.  If you are 26 years of age or older, or if you are at risk for osteoporosis and fractures, ask your health care provider if you should be screened.  Ask your health care provider whether you should take a calcium or vitamin D supplement to lower your risk for osteoporosis.  Menopause may have certain physical symptoms and risks.  Hormone replacement therapy may reduce some of these symptoms and risks. Talk to your health care provider about whether hormone replacement therapy is right for you. Follow these instructions at home:  Schedule regular health, dental, and eye exams.  Stay current with your immunizations.  Do not use any tobacco products including cigarettes, chewing tobacco, or electronic cigarettes.  If you are pregnant, do not drink alcohol.  If you are  breastfeeding, limit how much and how often you drink alcohol.  Limit alcohol intake to no more than 1 drink per day for nonpregnant women. One drink equals 12 ounces of beer, 5 ounces of wine, or 1 ounces of hard liquor.  Do not use street drugs.  Do not share needles.  Ask your health care provider for help if you need support or information about quitting drugs.  Tell your health care provider if you often feel depressed.  Tell your health care provider if you have ever been abused or do not feel safe at home. This information is not intended to replace advice given to you by your health care provider. Make sure you discuss any questions you have with your health care provider. Document Released: 01/17/2011 Document Revised: 12/10/2015 Document Reviewed: 04/07/2015 Elsevier Interactive Patient Education  [employer].

## 2017-09-19 NOTE — Progress Notes (Signed)
    Subjective:    Patient ID: Marissa Blankenship, female    DOB: 03-07-1966, 52 y.o.   MRN: [id number]   CC: establish care, cold, ear "clogged"  PMH- HTN, crohn disease (sees GI), depression (sees therapist) meds- norvasc 10 mg allg- seasonal surg- bowel resection FH- DM, CAD on father's side, ?CVA, parents both have HTN SH- social alcohol, currently smokes 2 cigarettes a day, marijuana occasionally for nausea Returned to smoking due to stress  Cold syx started Friday with sore throat, loss of voice, cough. No fevers or chills. Taking OTC cold medication.  Ear clogged intermittently, will pop eventually. Annoying to her but not painful. No loss of hearing. No ringing sound. Has seasonal allergies, takes allergy medication as needed.  Review of Systems- see HPI   Objective:  BP 128/82   Pulse 75   Temp 97.9 F (36.6 C) (Oral)   Ht 5' 4"  (1.626 m)   Wt 159 lb (72.1 kg)   SpO2 99%   BMI 27.29 kg/m  Vitals and nursing note reviewed  General: well nourished, in no acute distress HEENT: normocephalic, TM's visualized bilaterally- R ear with significant amount of cerumen manually removed, no scleral icterus or conjunctival pallor, no nasal discharge, moist mucous membranes, good dentition without erythema or discharge noted in posterior oropharynx Neck: supple, non-tender, without lymphadenopathy Cardiac: RRR, clear S1 and S2, no murmurs, rubs, or gallops Respiratory: clear to auscultation bilaterally, no increased work of breathing Skin: warm and dry, no rashes noted Neuro: alert and oriented, no focal deficits Psych: appropriate mood and affect   Assessment & Plan:    1. Essential hypertension Well controlled on Norvasc 10. Continue current regimen. Will check kidney/liver function today and electrolytes given hx of hypokalemia/hyponatremia as well - CMP14+EGFR  2. Crohn's disease with complication, unspecified gastrointestinal tract location [facility]) Not on any medication  currently, follows with GI. S/p colectomy.  3. Impacted cerumen of right ear Wax removed w/ curette. Symptoms of occasional fullness likely eustachian tube dysfunction. Encouraged antihistamine.   Return in about 6 months (around 03/22/2018), or as needed.   Lucila Maine, DO Family Medicine Resident PGY-2

## 2017-09-20 ENCOUNTER — Encounter: Payer: Self-pay | Admitting: Family Medicine

## 2017-09-20 LAB — CMP14+EGFR
ALBUMIN: 4.2 g/dL (ref 3.5–5.5)
ALK PHOS: 100 IU/L (ref 39–117)
ALT: 13 IU/L (ref 0–32)
AST: 20 IU/L (ref 0–40)
Albumin/Globulin Ratio: 1.4 (ref 1.2–2.2)
BUN / CREAT RATIO: 16 (ref 9–23)
BUN: 9 mg/dL (ref 6–24)
Bilirubin Total: 0.2 mg/dL (ref 0.0–1.2)
CO2: 26 mmol/L (ref 20–29)
CREATININE: 0.58 mg/dL (ref 0.57–1.00)
Calcium: 9.2 mg/dL (ref 8.7–10.2)
Chloride: 105 mmol/L (ref 96–106)
GFR, EST AFRICAN AMERICAN: 125 mL/min/{1.73_m2} (ref >59)
GFR, EST NON AFRICAN AMERICAN: 109 mL/min/{1.73_m2} (ref >59)
GLOBULIN, TOTAL: 2.9 g/dL (ref 1.5–4.5)
Glucose: 83 mg/dL (ref 65–99)
Potassium: 4.3 mmol/L (ref 3.5–5.2)
SODIUM: 142 mmol/L (ref 134–144)
TOTAL PROTEIN: 7.1 g/dL (ref 6.0–8.5)

## 2017-09-21 ENCOUNTER — Telehealth: Payer: Self-pay

## 2017-09-21 NOTE — Telephone Encounter (Signed)
Called [employer] to start PA for Stelara. They had originally instructed me to call, now they want to have the form printed off and faxed in. Faxed clinical information today.

## 2017-10-05 ENCOUNTER — Other Ambulatory Visit: Payer: Self-pay

## 2017-10-05 ENCOUNTER — Telehealth: Payer: Self-pay

## 2017-10-05 NOTE — Telephone Encounter (Signed)
Patient insurance denied Stelara, patient needs recent testing for Quantiferon Tb, Hep B SAG and Core Ab.

## 2017-10-10 ENCOUNTER — Other Ambulatory Visit: Payer: Self-pay

## 2017-10-10 DIAGNOSIS — K5000 Crohn's disease of small intestine without complications: Secondary | ICD-10-CM

## 2017-10-10 NOTE — Telephone Encounter (Signed)
Denied it because she did not have those labs, will order them and resubmit request.

## 2017-10-10 NOTE — Telephone Encounter (Signed)
Did they deny it because they require those labs?  If so, please order them to be done this week.  Or was it denied for some other reason?

## 2017-10-10 NOTE — Telephone Encounter (Signed)
Patient will go to lab tomorrow to have additional labwork done.

## 2017-10-17 ENCOUNTER — Other Ambulatory Visit: Payer: Medicaid Other

## 2017-10-17 DIAGNOSIS — K5000 Crohn's disease of small intestine without complications: Secondary | ICD-10-CM

## 2017-10-18 LAB — QUANTIFERON-TB GOLD PLUS
NIL: 0.04 [IU]/mL
QuantiFERON-TB Gold Plus: NEGATIVE
TB2-NIL: 0 IU/mL

## 2017-10-18 LAB — HEPATITIS B CORE ANTIBODY, IGM: HEP B C IGM: NONREACTIVE

## 2017-10-18 LAB — HEPATITIS B SURFACE ANTIGEN: HEP B S AG: NONREACTIVE

## 2017-12-29 IMAGING — CT CT ABD-PELV W/ CM
2 of 5 series · 13 of 46 positions shown, 15 images · IV contrast (ISOVUE)
Comparison: CT dated 11/28/2016

CLINICAL DATA: 51-year-old female with history of Crohn's
presenting with generalized abdominal pain.

EXAM:
CT ABDOMEN AND PELVIS WITH CONTRAST
TECHNIQUE: Multidetector CT imaging of the abdomen and pelvis was performed
using the standard protocol following bolus administration of
intravenous contrast.
CONTRAST:  100 cc [id number]

[Series 2: abd/pel with · axial · 0.63mm/px · z∈[-193,+162]mm · 10 of 81 slices shown, 12 images]
[im 5/81  soft-tissue]
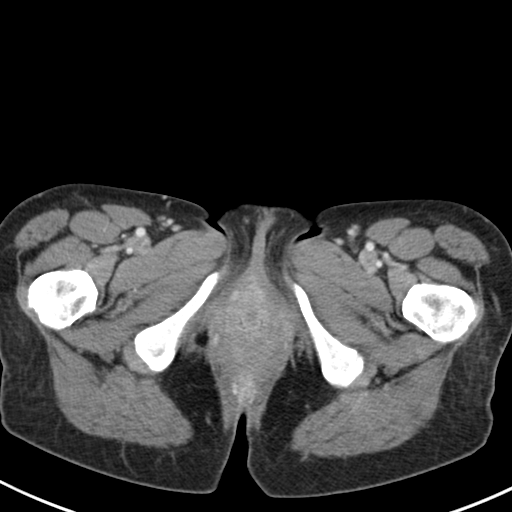
[im 5/81  bone]
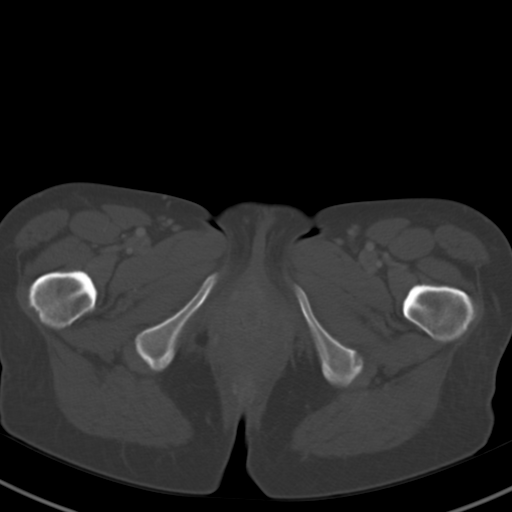
[im 15/81  soft-tissue]
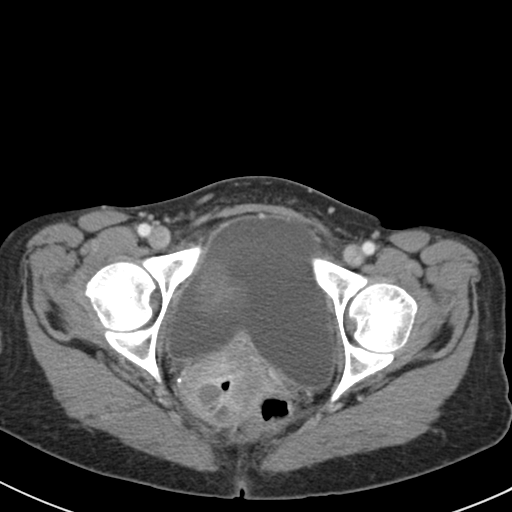
[im 24/81  soft-tissue]
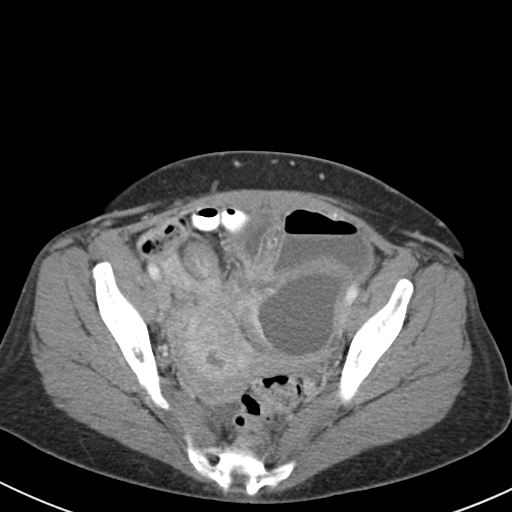
[im 29/81  soft-tissue]
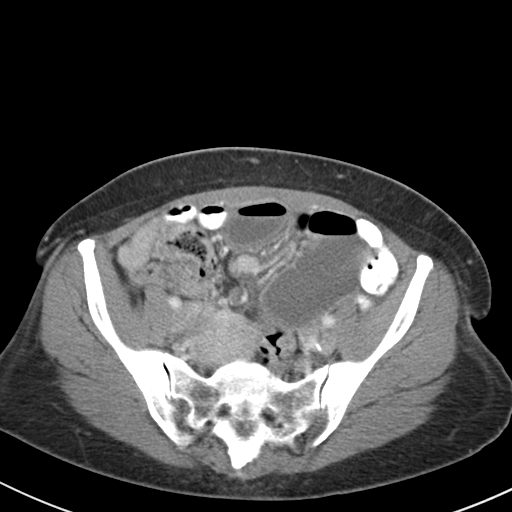
[im 38/81  soft-tissue]
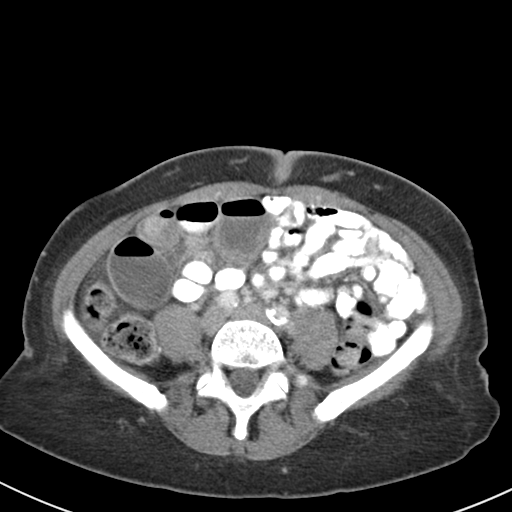
[im 43/81  soft-tissue]
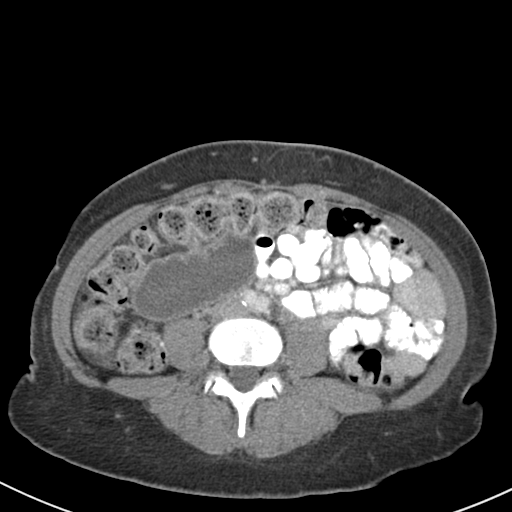
[im 52/81  soft-tissue]
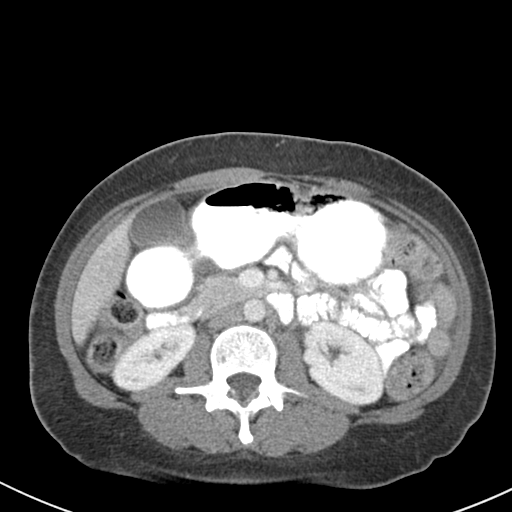
[im 62/81  soft-tissue]
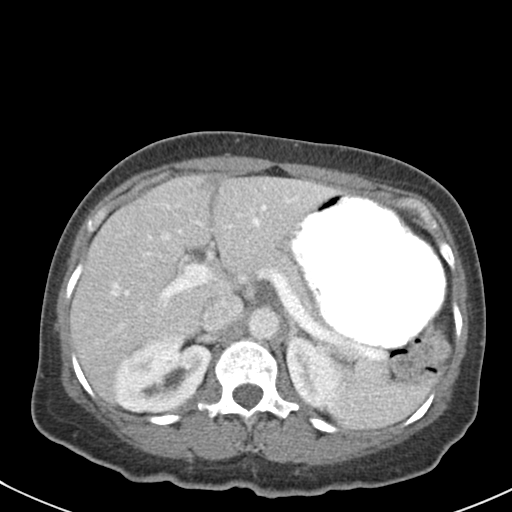
[im 66/81  soft-tissue]
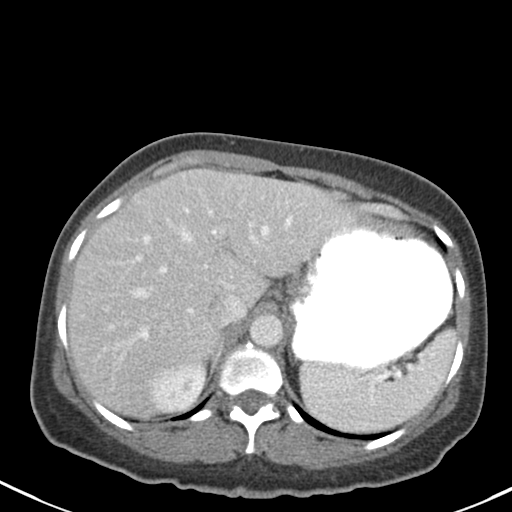
[im 66/81  bone]
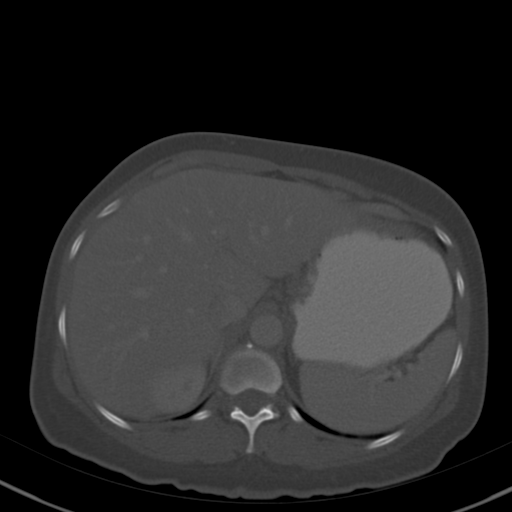
[im 76/81  soft-tissue]
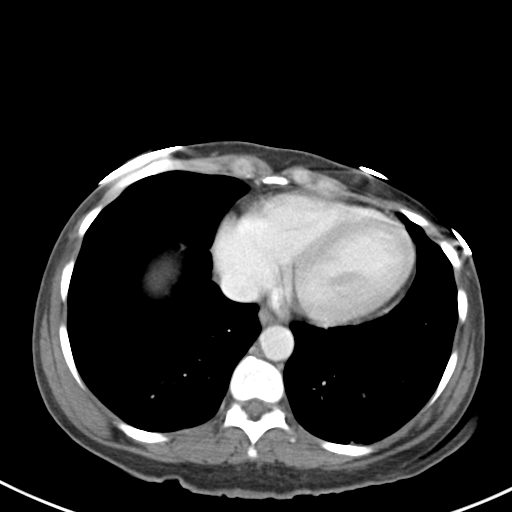

[Series 4: coronal a/|p · coronal · 0.57mm/px · 3 of 110 slices shown]
[im 37/110  soft-tissue]
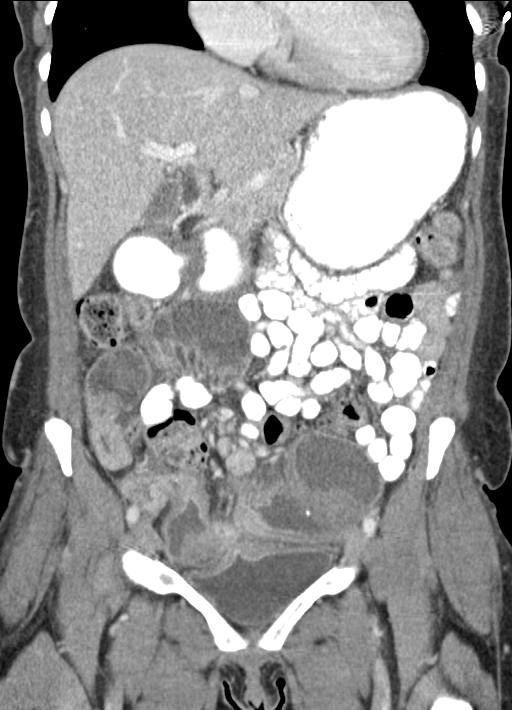
[im 49/110  soft-tissue]
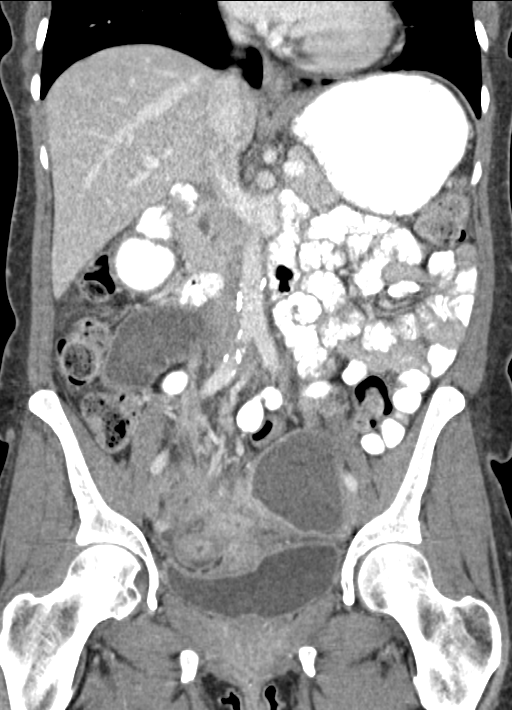
[im 61/110  soft-tissue]
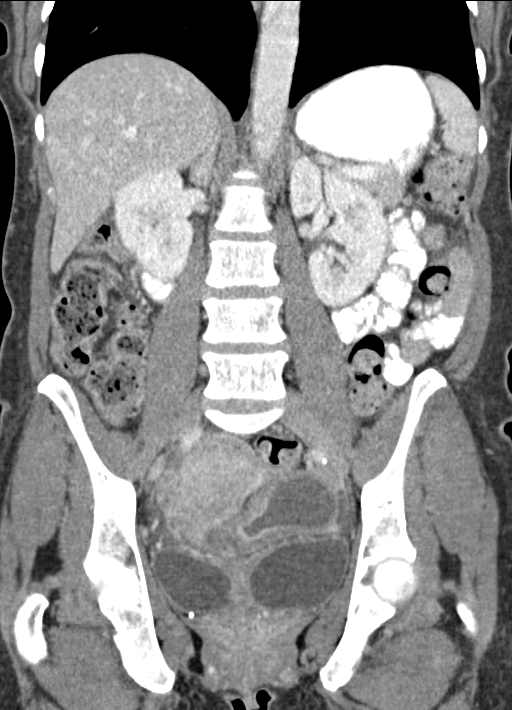

[13 of 46 positions shown; findings below may reference images not displayed]

FINDINGS: Lower chest: The visualized lung bases are clear.

No intra-abdominal free air. There is moderate amount of free fluid
within the posterior pelvis, increased compared to the prior CT.

Hepatobiliary: Probable mild fatty infiltration of the liver. No
intrahepatic biliary ductal dilatation. The gallbladder is
unremarkable.

Pancreas: Unremarkable. No pancreatic ductal dilatation or
surrounding inflammatory changes.

Spleen: Normal in size without focal abnormality.

Adrenals/Urinary Tract: Mild bilateral pelviectasis, right greater
left and similar or minimally increased compared to the prior CT.
There is symmetric enhancement and excretion of contrast by both
kidneys. The urinary bladder is distended. There is diffuse
thickened bladder wall likely related to inflammatory changes of the
bowel within the pelvis. There is a focal area of loss of fat plane
superior to the bladder with adhesion of the distal small bowel.
There is a a focal area of mucosal retraction and beaking of the
dome of the bladder (coronal series 4, image 45) which appears
somewhat contiguous with a low attenuating channel which may
represent a scarred or narrowed fistulous tract.

Stomach/Bowel: Oral contrast opacifies the stomach and multiple
loops of small bowel. There is no evidence of gastric outlet
obstruction. There is inflammatory changes of the bowel within the
pelvis with an area of tethering and adhesions of loops of small
bowel (series 2, image 60) there is high grade narrowing of the
terminal ileum in the right hemipelvis superior to the bladder with
associated small-bowel obstruction. The distal small bowel loops
measure up to approximately 4 cm in diameter. There is increase in
the degree of obstruction and dilatation of the distal small bowel
compared to the prior CT. Oral contrast terminates in the normal
caliber distal small bowel within the pelvis and does not reach the
terminal ileum or segments of dilated distal ileum. This may be
related to timing of the contrast or obstruction related to
adhesions. There is an approximately 10 cm strictured segment of the
terminal ileum (series 4, image 44, and 56). There are multiple
areas of strictures in the distal ileum. There is mucosal
enhancement of the dilated loops of distal small bowel suggestive of
recurrent or flare up of Crohn's disease. No drainable fluid
collection or abscess identified. There is moderate stool throughout
the colon. No inflammatory changes of the colon.

Vascular/Lymphatic: There is moderate aortoiliac atherosclerotic
disease. The origins of the celiac axis, SMA, IMA appear patent. The
SMV, splenic vein, and main portal vein are patent. No portal venous
gas identified. There is no adenopathy.

Reproductive: Multiple mildly enlarged nabothian cysts noted. There
is small amount of fluid within the endometrial canal. There is
small amount of air within the vagina which may be introduced
through the vaginal canal. However, an enterovaginal fistula is not
entirely excluded. The ovaries are grossly unremarkable as
visualized.

Other: Mild diffuse subcutaneous stranding.

Musculoskeletal: No acute or significant osseous findings.
IMPRESSION: 1. Inflammatory changes of the distal small bowel with areas of
adhesions and strictures and resulting obstruction. Overall there
has been interval increase in the degree of obstruction compared to
the prior study. There is mucosal enhancement of the dilated loops
of small bowel suggestive of active disease or flare up of Crohn's.
There is a approximately 10 cm stricture of the terminal ileum. No
drainable fluid collection or abscess.
2. Adhesions of the distal small bowel to the bladder dome. A focal
area retraction and beaking in the bladder dome appears to be
contiguous with a narrow tract and may represent a scarred fistula.
3. Air within the vagina may be introduced from the vaginal canal.
However, and enterovaginal fistula is not entirely excluded.

## 2018-01-17 ENCOUNTER — Other Ambulatory Visit: Payer: Self-pay | Admitting: Family Medicine

## 2018-02-08 ENCOUNTER — Telehealth: Payer: Self-pay

## 2018-02-08 NOTE — Telephone Encounter (Signed)
Called Marissa Blankenship to check on prior auth for Stelara. Spoke to two different people, they had only approved the injectable pens and not the IV. Gave verbal information to pharmacy rep. This information will need to be reviewed by the pharmacy and a decision will be made in 24 hours. Ref #[id number].

## 2018-02-09 ENCOUNTER — Other Ambulatory Visit: Payer: Self-pay

## 2018-02-09 ENCOUNTER — Telehealth: Payer: Self-pay

## 2018-02-09 DIAGNOSIS — K50012 Crohn's disease of small intestine with intestinal obstruction: Secondary | ICD-10-CM

## 2018-02-09 MED ORDER — USTEKINUMAB 90 MG/ML ~~LOC~~ SOSY: 90.0000 mg | PREFILLED_SYRINGE | SUBCUTANEOUS | 6 refills | Status: DC

## 2018-02-09 NOTE — Telephone Encounter (Signed)
Patient advised of approval for both the Stelara IV 390 mg induction and the Stelara 90 mg pen injections every 8 weeks. Unfortunately, her insurance had denied the first attempt for authorization. Patient had to have some updated lab work. Insurance then only authorized the pen, had to get another authorization for the induction IV. Received approval 02/08/18 #[id number] for IV, and pen valid from 10/19/17-10/14/18.  I have sent enrollment form to [facility] for nurse navigator to come out and teach patient, let her know that this will be similar to what she had with Humira pen.

## 2018-02-19 ENCOUNTER — Other Ambulatory Visit: Payer: Self-pay

## 2018-02-20 ENCOUNTER — Other Ambulatory Visit (HOSPITAL_COMMUNITY): Payer: Self-pay | Admitting: *Deleted

## 2018-02-21 ENCOUNTER — Ambulatory Visit (HOSPITAL_COMMUNITY)
Admission: RE | Admit: 2018-02-21 | Discharge: 2018-02-21 | Disposition: A | Discharge status: Home / Self Care | Payer: Medicaid Other | Source: Ambulatory Visit | Attending: Gastroenterology | Admitting: Gastroenterology

## 2018-02-21 DIAGNOSIS — K50012 Crohn's disease of small intestine with intestinal obstruction: Secondary | ICD-10-CM

## 2018-02-21 MED ORDER — USTEKINUMAB 130 MG/26ML IV SOLN
390.0000 mg | Freq: Once | INTRAVENOUS | Status: AC
  Administered 2018-02-21: 390 mg via INTRAVENOUS
  Filled 2018-02-21: qty 78

## 2018-02-21 NOTE — Discharge Instructions (Signed)
Ustekinumab injection What is this medicine? USTEKINUMAB ([place] te KIN ue mab) is used to treat plaque psoriasis and psoriatic arthritis. This medicine is also used to treat Crohn's disease. It is not a cure. This medicine may be used for other purposes; ask your health care provider or pharmacist if you have questions. COMMON BRAND NAME(S): Stelara What should I tell my health care provider before I take this medicine? They need to know if you have any of these conditions: -cancer -diabetes -immune system problems -infection (especially a virus infection such as chickenpox, cold sores, or herpes) or history of infections -receiving or have received allergy shots -recently received or scheduled to receive a vaccine -tuberculosis, a positive skin test for tuberculosis, or have recently been in close contact with someone who has tuberculosis -an unusual reaction to ustekinumab, latex, other medicines, foods, dyes, or preservatives -pregnant or trying to get pregnant -breast-feeding How should I use this medicine? This medicine is for injection under the skin or infusion into a vein. It is usually given by a health care professional in a hospital or clinic setting. If you get this medicine at home, you will be taught how to prepare and give this medicine. Use exactly as directed. Take your medicine at regular intervals. Do not take your medicine more often than directed. It is important that you put your used needles and syringes in a special sharps container. Do not put them in a trash can. If you do not have a sharps container, call your pharmacist or healthcare provider to get one. A special MedGuide will be given to you by the pharmacist with each prescription and refill. Be sure to read this information carefully each time. Talk to your pediatrician regarding the use of this medicine in children. While this drug may be prescribed for children as young as 12 years for selected conditions,  precautions do apply. Overdosage: If you think you have taken too much of this medicine contact a poison control center or emergency room at once. NOTE: This medicine is only for you. Do not share this medicine with others. What if I miss a dose? If you miss a dose, take it as soon as you can. If it is almost time for your next dose, take only that dose. Do not take double or extra doses. What may interact with this medicine? Do not take this medicine with any of the following medications: -live virus vaccines This medicine may also interact with the following medications: -cyclosporine -immunosuppressives -vaccines -warfarin This list may not describe all possible interactions. Give your health care provider a list of all the medicines, herbs, non-prescription drugs, or dietary supplements you use. Also tell them if you smoke, drink alcohol, or use illegal drugs. Some items may interact with your medicine. What should I watch for while using this medicine? Your condition will be monitored carefully while you are receiving this medicine. Tell your doctor or healthcare professional if your symptoms do not start to get better or if they get worse. You will be tested for tuberculosis (TB) before you start this medicine. If your doctor prescribes any medicine for TB, you should start taking the TB medicine before starting this medicine. Make sure to finish the full course of TB medicine. Call your doctor or health care professional if you get a cold or other infection while receiving this medicine. Do not treat yourself. This medicine may decrease your body's ability to fight infection. Talk to your doctor about your risk of  cancer. You may be more at risk for certain types of cancers if you take this medicine. What side effects may I notice from receiving this medicine? Side effects that you should report to your doctor or health care professional as soon as possible: -allergic reactions like skin  rash, itching or hives, swelling of the face, lips, or tongue -breathing problems -changes in vision -confusion -seizures -signs and symptoms of infection like fever or chills; cough; sore throat; pain or trouble passing urine -swollen lymph nodes in the neck, underarm, or groin areas -unexplained weight loss -unusually weak or tired -vomiting Side effects that usually do not require medical attention (report to your doctor or health care professional if they continue or are bothersome): -headache -nausea -redness, itching, swelling, or bruising at site where injected This list may not describe all possible side effects. Call your doctor for medical advice about side effects. You may report side effects to FDA at 1-800-FDA-1088. Where should I keep my medicine? Keep out of the reach of children. If you are using this medicine at home, you will be instructed on how to store this medicine. Store the prefilled syringes in a refrigerator between 2 to 8 degrees C (36 to 46 degrees F). Keep in the original carton. Protect from light. Do not freeze. Do not shake. Throw away any unused medicine after the expiration date on the label. NOTE: This sheet is a summary. It may not cover all possible information. If you have questions about this medicine, talk to your doctor, pharmacist, or health care provider.  2018 Elsevier/Gold Standard (2016-05-03 09:12:47)

## 2018-03-29 ENCOUNTER — Telehealth: Payer: Self-pay | Admitting: Gastroenterology

## 2018-03-29 NOTE — Telephone Encounter (Signed)
Patient wanted to know if she would be able to donate blood/plasma. Advised patient not to do this as she is at an increased risk for infection already while being on a biologic. Patient verbalized understanding and will not donate blood.

## 2018-04-16 ENCOUNTER — Other Ambulatory Visit: Payer: Self-pay

## 2018-04-16 ENCOUNTER — Encounter: Payer: Self-pay | Admitting: Family Medicine

## 2018-04-16 ENCOUNTER — Ambulatory Visit: Payer: Medicaid Other | Admitting: Family Medicine

## 2018-04-16 ENCOUNTER — Ambulatory Visit (INDEPENDENT_AMBULATORY_CARE_PROVIDER_SITE_OTHER): Payer: Medicaid Other | Admitting: Family Medicine

## 2018-04-16 VITALS — BP 141/75 | HR 62 | Temp 97.9°F | Wt 168.0 lb

## 2018-04-16 DIAGNOSIS — M722 Plantar fascial fibromatosis: Secondary | ICD-10-CM

## 2018-04-16 DIAGNOSIS — F329 Major depressive disorder, single episode, unspecified: Secondary | ICD-10-CM | POA: Diagnosis not present

## 2018-04-16 DIAGNOSIS — F419 Anxiety disorder, unspecified: Secondary | ICD-10-CM

## 2018-04-16 DIAGNOSIS — F32A Depression, unspecified: Secondary | ICD-10-CM | POA: Insufficient documentation

## 2018-04-16 DIAGNOSIS — Z23 Encounter for immunization: Secondary | ICD-10-CM

## 2018-04-16 MED ORDER — ESCITALOPRAM OXALATE 10 MG PO TABS
10.0000 mg | ORAL_TABLET | Freq: Every day | ORAL | 0 refills | Status: DC
Start: 2018-04-16 — End: 2018-05-13

## 2018-04-16 NOTE — Assessment & Plan Note (Signed)
  Reviewed conservative treatments. Patient feels these worked in past but stopped doing stretching/wearing the right shoes. If pain persists/worsens would have her see ortho for potential injection. Follow up as needed

## 2018-04-16 NOTE — Assessment & Plan Note (Signed)
  Significantly elevated GAD7 and PHQ9 scores. No SI/HI. Will start 10 mg lexapro daily and follow up w/ me in 2 weeks to assess how she is doing. Encouraged her to call her therapist back to see if she can restart therapy as there was some improvement with that in the past. I can refer her if necessary, asked her to call me if this is needed. Patient verbalized understanding and agreement with plan.

## 2018-04-16 NOTE — Patient Instructions (Signed)
It was nice to see you today!  Please let me know if you need a referral to see the therapist.  We'll see you back in 2 weeks to check in.  If you have questions or concerns please do not hesitate to call at [phone].  Lucila Maine, DO PGY-3, [facility] 04/16/2018 9:32 AM    Plantar Fasciitis Plantar fasciitis is a painful foot condition that affects the heel. It occurs when the band of tissue that connects the toes to the heel bone (plantar fascia) becomes irritated. This can happen after exercising too much or doing other repetitive activities (overuse injury). The pain from plantar fasciitis can range from mild irritation to severe pain that makes it difficult for you to walk or move. The pain is usually worse in the morning or after you have been sitting or lying down for a while. What are the causes? This condition may be caused by:  Standing for long periods of time.  Wearing shoes that do not fit.  Doing high-impact activities, including running, aerobics, and ballet.  Being overweight.  Having an abnormal way of walking (gait).  Having tight calf muscles.  Having high arches in your feet.  Starting a new athletic activity.  What are the signs or symptoms? The main symptom of this condition is heel pain. Other symptoms include:  Pain that gets worse after activity or exercise.  Pain that is worse in the morning or after resting.  Pain that goes away after you walk for a few minutes.  How is this diagnosed? This condition may be diagnosed based on your signs and symptoms. Your health care provider will also do a physical exam to check for:  A tender area on the bottom of your foot.  A high arch in your foot.  Pain when you move your foot.  Difficulty moving your foot.  You may also need to have imaging studies to confirm the diagnosis. These can include:  X-rays.  Ultrasound.  MRI.  How is this treated? Treatment for  plantar fasciitis depends on the severity of the condition. Your treatment may include:  Rest, ice, and over-the-counter pain medicines to manage your pain.  Exercises to stretch your calves and your plantar fascia.  A splint that holds your foot in a stretched, upward position while you sleep (night splint).  Physical therapy to relieve symptoms and prevent problems in the future.  Cortisone injections to relieve severe pain.  Extracorporeal shock wave therapy (ESWT) to stimulate damaged plantar fascia with electrical impulses. It is often used as a last resort before surgery.  Surgery, if other treatments have not worked after 12 months.  Follow these instructions at home:  Take medicines only as directed by your health care provider.  Avoid activities that cause pain.  Roll the bottom of your foot over a bag of ice or a bottle of cold water. Do this for 20 minutes, 3-4 times a day.  Perform simple stretches as directed by your health care provider.  Try wearing athletic shoes with air-sole or gel-sole cushions or soft shoe inserts.  Wear a night splint while sleeping, if directed by your health care provider.  Keep all follow-up appointments with your health care provider. How is this prevented?  Do not perform exercises or activities that cause heel pain.  Consider finding low-impact activities if you continue to have problems.  Lose weight if you need to. The best way to prevent plantar fasciitis is to avoid  the activities that aggravate your plantar fascia. Contact a health care provider if:  Your symptoms do not go away after treatment with home care measures.  Your pain gets worse.  Your pain affects your ability to move or do your daily activities. This information is not intended to replace advice given to you by your health care provider. Make sure you discuss any questions you have with your health care provider. Document Released: 03/29/2001 Document  Revised: 12/07/2015 Document Reviewed: 05/14/2014 Elsevier Interactive Patient Education  [employer].

## 2018-04-16 NOTE — Progress Notes (Signed)
    Subjective:    Patient ID: Marissa Blankenship, female    DOB: 03-09-1966, 52 y.o.   MRN: [id number]   CC: anxiety  HPI: patient reports worsening anxiety over the past several months. She has had chronic issues with anxiety. She had been going to a therapist this time last year after her colon surgery and various other family members had significant health problems. She felt slightly improved with therapy and incorporated some stress relief techniques into her life but then stopped going. She reports she has been trying to deal with anxiety on her own but feels it is bad enough now that she needs to come in to talk about it. She has a Advertising account executive from her old therapist but did not listen to it yet. She is willing to go back to therapy. She is interested in starting medication to help with her mood. She reports she is having a very difficult time falling asleep and staying asleep. She denies SI/HI.   She also has ongoing foot pain left worse than right that she was seen in the past by ortho for. She was told it was plantar fasciitis. She did home exercises and wore more supportive shoes but recently stopped doing those things and feels like the pain returned. She would like to know what else she could do about this.   Smoking status reviewed- former smoker  Review of Systems- no chest pain, SOB   Objective:  BP (!) 141/75   Pulse 62   Temp 97.9 F (36.6 C) (Oral)   Wt 168 lb (76.2 kg)   SpO2 96%   BMI 27.96 kg/m  Vitals and nursing note reviewed  General: well nourished, in no acute distress HEENT: normocephalic, MMM Cardiac: regular rate Respiratory: no increased work of breathing Extremities: no edema or cyanosis Neuro: alert and oriented, no focal deficits Psych: mood is anxious, affect congruent   Assessment & Plan:    Anxiety and depression  Significantly elevated GAD7 and PHQ9 scores. No SI/HI. Will start 10 mg lexapro daily and follow up w/ me in 2 weeks to assess how she is  doing. Encouraged her to call her therapist back to see if she can restart therapy as there was some improvement with that in the past. I can refer her if necessary, asked her to call me if this is needed. Patient verbalized understanding and agreement with plan.   PLANTAR FASCIITIS, LEFT  Reviewed conservative treatments. Patient feels these worked in past but stopped doing stretching/wearing the right shoes. If pain persists/worsens would have her see ortho for potential injection. Follow up as needed     Return in about 2 weeks (around 04/30/2018).   Lucila Maine, DO Family Medicine Resident PGY-3

## 2018-04-18 ENCOUNTER — Telehealth: Payer: Self-pay | Admitting: Gastroenterology

## 2018-04-18 NOTE — Telephone Encounter (Signed)
Called [facility] (approval has been in place) and called [facility] in [place], everything has been straightened out and medication is being shipped. They had called the patient back in between phone calls and patient aware that medication is being shipped today. I have asked her when she is due for the next injection to call them 1-2 weeks ahead of time to make sure they get this out to her in a timely manner.

## 2018-04-30 ENCOUNTER — Ambulatory Visit: Payer: Medicaid Other | Admitting: Family Medicine

## 2018-05-01 ENCOUNTER — Encounter: Payer: Self-pay | Admitting: Gastroenterology

## 2018-05-01 ENCOUNTER — Ambulatory Visit: Payer: Medicaid Other | Admitting: Gastroenterology

## 2018-05-01 VITALS — BP 94/72 | HR 61 | Ht 64.0 in | Wt 168.0 lb

## 2018-05-01 DIAGNOSIS — D500 Iron deficiency anemia secondary to blood loss (chronic): Secondary | ICD-10-CM | POA: Diagnosis not present

## 2018-05-01 DIAGNOSIS — K219 Gastro-esophageal reflux disease without esophagitis: Secondary | ICD-10-CM

## 2018-05-01 DIAGNOSIS — R1013 Epigastric pain: Secondary | ICD-10-CM

## 2018-05-01 DIAGNOSIS — R110 Nausea: Secondary | ICD-10-CM | POA: Diagnosis not present

## 2018-05-01 DIAGNOSIS — G8929 Other chronic pain: Secondary | ICD-10-CM

## 2018-05-01 DIAGNOSIS — K5000 Crohn's disease of small intestine without complications: Secondary | ICD-10-CM

## 2018-05-01 MED ORDER — OMEPRAZOLE 40 MG PO CPDR: 40.0000 mg | DELAYED_RELEASE_CAPSULE | Freq: Every day | ORAL | 2 refills | Status: DC

## 2018-05-01 NOTE — Progress Notes (Signed)
[facility] Progress Note  Chief Complaint: Small bowel Crohn's disease  Subjective  History:  Marissa Blankenship follows up for her Crohn's disease, and I last saw her for her colonoscopy in January 2019.  This was done after previous resection for severe these causing chronic obstruction.  She had failed Humira therapy prior to that surgery.  This last colonoscopy showed a single shallow ulcer in the neoterminal ileum and a deep semicircumferential ulcer at the anastomosis.  After significant efforts and authorization, she began Stelara with the initial IV infusion 02/21/18, and one SQ home dose 10/4.  Wt up to 30 pounds since Crohn's surgery summer 2018.  More frequent BMs since starting Stelara, now settling back out to usual two/day.   Appetite down last 2 weeks.  Month or two worsened heartburn, incl ovn.  Taking pepcid in AM without much improvement, no dysphagia Denies rectal bleeding Intermittent epigastric pain  ROS: Cardiovascular:  no chest pain Respiratory: no dyspnea Mood stable on Lexapro  Remainder of systems negative except as above The patient's Past Medical, Family and Social History were reviewed and are on file in the EMR.  Objective:  Med list reviewed  Current Outpatient Medications:  .  amLODipine (NORVASC) 10 MG tablet, TAKE 1 TABLET BY MOUTH EVERY DAY, Disp: 90 tablet, Rfl: 3 .  calcium-vitamin D (OSCAL WITH D) 500-200 MG-UNIT tablet, Take 1 tablet by mouth 2 (two) times daily. (Patient taking differently: Take 1 tablet by mouth 2 (two) times daily as needed. ), Disp: 60 tablet, Rfl: 6 .  escitalopram (LEXAPRO) 10 MG tablet, Take 1 tablet (10 mg total) by mouth daily., Disp: 30 tablet, Rfl: 0 .  famotidine-calcium carbonate-magnesium hydroxide (PEPCID COMPLETE) 10-800-165 MG chewable tablet, Chew 1 tablet by mouth daily., Disp: , Rfl:  .  ferrous sulfate 325 (65 FE) MG tablet, Take 1 tablet (325 mg total) by mouth 2 (two) times daily with a meal. (Patient  taking differently: Take 325 mg by mouth daily. ), Disp: 60 tablet, Rfl: 3 .  lip balm (CARMEX) ointment, Apply 1 application topically 2 (two) times daily., Disp: 7 g, Rfl: 0 .  ustekinumab (STELARA) 90 MG/ML SOSY injection, Inject 1 mL (90 mg total) into the skin every 8 (eight) weeks., Disp: 1 Syringe, Rfl: 6 .  omeprazole (PRILOSEC) 40 MG capsule, Take 1 capsule (40 mg total) by mouth daily., Disp: 30 capsule, Rfl: 2   Vital signs in last 24 hrs: Vitals:   05/01/18 1518  BP: 94/72  Pulse: 61    Physical Exam  Affect somewhat decreased, as she has had previously.  She is pleasant, conversational and in no acute distress.  She has good muscle mass, and has put on some truncal weight since I last saw her  HEENT: sclera anicteric, oral mucosa moist without lesions  Neck: supple, no thyromegaly, JVD or lymphadenopathy  Cardiac: RRR without murmurs, S1S2 heard, no peripheral edema  Pulm: clear to auscultation bilaterally, normal RR and effort noted  Abdomen: soft, no tenderness, with active bowel sounds. No guarding or palpable hepatosplenomegaly.  Skin; warm and dry, no jaundice or rash  Recent Labs:  CMP Latest Ref Rng & Units 09/19/2017 02/03/2017 02/02/2017  Glucose 65 - 99 mg/dL 83 80 85  BUN 6 - 24 mg/dL 9 7 <5(L)  Creatinine 0.57 - 1.00 mg/dL 0.58 0.60 0.61  Sodium 134 - 144 mmol/L 142 134(L) 133(L)  Potassium 3.5 - 5.2 mmol/L 4.3 3.9 3.9  Chloride 96 - 106 mmol/L  105 102 100(L)  CO2 20 - 29 mmol/L _0 Calcium 8.7 - 10.2 mg/dL 9.2 8.3(L) 8.5(L)  Total Protein 6.0 - 8.5 g/dL 7.1 5.9(L) 6.4(L)  Total Bilirubin 0.0 - 1.2 mg/dL 0.2 0.7 0.5  Alkaline Phos 39 - 117 IU/L 100 52 55  AST 0 - 40 IU/L 20 14(L) 15  ALT 0 - 32 IU/L 13 11(L) 12(L)     _1 @ Assessment: Encounter Diagnoses  Name Primary?  . Nausea without vomiting Yes  . Iron deficiency anemia due to chronic blood loss   . Crohn's disease of small intestine without complication ([place])   .  Gastroesophageal reflux disease, esophagitis presence not specified   . Abdominal pain, chronic, epigastric    She had experienced heartburn before, usually when she was on steroids for Crohn's.  It is not clear why it has worsened recently.  Her weight is up, which could be contributing to more reflux.  She also admits that she is not consistent taking the Pepcid.  Crohn's seems to be under good control, she is only about 2 months into treatment with Stelara after the prior surgery.  She had chronic iron deficiency anemia in the past from occult GI blood loss due to the Crohn's.  Still taking iron but her blood counts have not been checked in some time.  Plan: CBC, B12, folate, iron levels, ESR Stop Pepcid, I prescribed omeprazole 40 mg once daily Serum H. pylori IgG antibody  See me in 2 months.  I asked her to call in several weeks with an update on her symptoms.  If she is not much improved, we may need to do upper endoscopy.  Total time 30 minutes, over half spent face-to-face with patient in counseling and coordination of care.   [name]

## 2018-05-01 NOTE — Patient Instructions (Signed)
If you are age 50 or older, your body mass index should be between 23-30. Your Body mass index is 28.84 kg/m. If this is out of the aforementioned range listed, please consider follow up with your Primary Care Provider.  If you are age 3 or younger, your body mass index should be between 19-25. Your Body mass index is 28.84 kg/m. If this is out of the aformentioned range listed, please consider follow up with your Primary Care Provider.   Your provider has requested that you go to the basement level for lab work before leaving today. Press "B" on the elevator. The lab is located at the first door on the left as you exit the elevator.  Call in a few weeks with an update. [phone]  Follow up in 2 months.

## 2018-05-04 ENCOUNTER — Other Ambulatory Visit (INDEPENDENT_AMBULATORY_CARE_PROVIDER_SITE_OTHER): Payer: Medicaid Other

## 2018-05-04 DIAGNOSIS — D500 Iron deficiency anemia secondary to blood loss (chronic): Secondary | ICD-10-CM | POA: Diagnosis not present

## 2018-05-04 DIAGNOSIS — G8929 Other chronic pain: Secondary | ICD-10-CM

## 2018-05-04 DIAGNOSIS — K219 Gastro-esophageal reflux disease without esophagitis: Secondary | ICD-10-CM | POA: Diagnosis not present

## 2018-05-04 DIAGNOSIS — K5000 Crohn's disease of small intestine without complications: Secondary | ICD-10-CM | POA: Diagnosis not present

## 2018-05-04 DIAGNOSIS — R110 Nausea: Secondary | ICD-10-CM

## 2018-05-04 DIAGNOSIS — R1013 Epigastric pain: Secondary | ICD-10-CM

## 2018-05-04 LAB — CBC WITH DIFFERENTIAL/PLATELET
BASOS ABS: 0 10*3/uL (ref 0.0–0.1)
Basophils Relative: 0.3 % (ref 0.0–3.0)
EOS ABS: 0.1 10*3/uL (ref 0.0–0.7)
Eosinophils Relative: 3.3 % (ref 0.0–5.0)
HCT: 39.6 % (ref 36.0–46.0)
HEMOGLOBIN: 13.1 g/dL (ref 12.0–15.0)
Lymphocytes Relative: 27.6 % (ref 12.0–46.0)
Lymphs Abs: 1.2 10*3/uL (ref 0.7–4.0)
MCHC: 33.1 g/dL (ref 30.0–36.0)
MCV: 87.2 fl (ref 78.0–100.0)
MONO ABS: 0.4 10*3/uL (ref 0.1–1.0)
Monocytes Relative: 9.1 % (ref 3.0–12.0)
Neutro Abs: 2.6 10*3/uL (ref 1.4–7.7)
Neutrophils Relative %: 59.7 % (ref 43.0–77.0)
Platelets: 197 10*3/uL (ref 150.0–400.0)
RBC: 4.54 Mil/uL (ref 3.87–5.11)
RDW: 14.2 % (ref 11.5–15.5)
WBC: 4.4 10*3/uL (ref 4.0–10.5)

## 2018-05-04 LAB — H. PYLORI ANTIBODY, IGG: H Pylori IgG: NEGATIVE

## 2018-05-04 LAB — SEDIMENTATION RATE: SED RATE: 35 mm/h — AB (ref 0–30)

## 2018-05-04 LAB — IBC PANEL
IRON: 68 ug/dL (ref 42–145)
SATURATION RATIOS: 17 % — AB (ref 20.0–50.0)
TRANSFERRIN: 285 mg/dL (ref 212.0–360.0)

## 2018-05-04 LAB — VITAMIN B12: Vitamin B-12: 191 pg/mL — ABNORMAL LOW (ref 211–911)

## 2018-05-04 LAB — FOLATE: Folate: >23.9 ng/mL (ref >5.9)

## 2018-05-04 LAB — FERRITIN: Ferritin: 34.3 ng/mL (ref 10.0–291.0)

## 2018-05-13 ENCOUNTER — Other Ambulatory Visit: Payer: Self-pay | Admitting: Family Medicine

## 2018-05-14 NOTE — Telephone Encounter (Signed)
I am covering Dr. Stefano Gaul inbox. Appears this patient was instructed to follow up 2 weeks from previous visit to recheck anxiety. Will provide one month refill, but patient needs appt in the next 2 weeks if possible.

## 2018-05-15 NOTE — Telephone Encounter (Signed)
LM for patient to call back and schedule an appointment ok per DPR.  Joanmarie Tsang,CMA

## 2018-05-21 ENCOUNTER — Telehealth: Payer: Self-pay | Admitting: *Deleted

## 2018-05-21 NOTE — Telephone Encounter (Signed)
Spoke with patient and scheduled her first injection for 05-30-18.  Will forward to MD to place standing orders for this medication.  Valli Randol,CMA

## 2018-05-21 NOTE — Telephone Encounter (Signed)
-----   Message from Steve Rattler, DO sent at 05/18/2018  4:48 PM EDT -----  Can we get this patient set up to get b12 injections at our clinic per the schedule below?  ----- Message ----- From: Wyatt Haste, RN Sent: 05/15/2018  12:00 PM EDT To: Steve Rattler, DO  Blood count and iron level normal.  However, B12 low because of Crohn's surgery. Needs replacement: 1000 micrograms IM weekly x 4, then 1000 micrograms IM monthly. If we do not currently have that at our office, then please help coordinate getting that through her primary care.  Blood test for the stomach bacteria is negative.  If her heartburn is not much improved since starting the omeprazole, please arrange an EGD in the [place] (per our recent clinic discussion).

## 2018-05-24 ENCOUNTER — Other Ambulatory Visit: Payer: Self-pay | Admitting: Family Medicine

## 2018-05-24 DIAGNOSIS — E538 Deficiency of other specified B group vitamins: Secondary | ICD-10-CM

## 2018-05-24 DIAGNOSIS — K50919 Crohn's disease, unspecified, with unspecified complications: Secondary | ICD-10-CM

## 2018-05-24 MED ORDER — CYANOCOBALAMIN 1000 MCG/ML IJ SOLN
1000.0000 ug | INTRAMUSCULAR | Status: AC
  Administered 2018-05-30 – 2018-06-13 (×3): 1000 ug via INTRAMUSCULAR

## 2018-05-24 NOTE — Telephone Encounter (Signed)
Ordered the first 4 injections then she will need 1000 mcg monthly.

## 2018-05-24 NOTE — Progress Notes (Signed)
Orders placed for b12 injections 1000 mcg weekly for 4 weeks

## 2018-05-30 ENCOUNTER — Ambulatory Visit (INDEPENDENT_AMBULATORY_CARE_PROVIDER_SITE_OTHER): Payer: Medicaid Other

## 2018-05-30 DIAGNOSIS — E538 Deficiency of other specified B group vitamins: Secondary | ICD-10-CM

## 2018-05-30 DIAGNOSIS — K50919 Crohn's disease, unspecified, with unspecified complications: Secondary | ICD-10-CM | POA: Diagnosis not present

## 2018-05-30 MED ORDER — CYANOCOBALAMIN 1000 MCG/ML IJ SOLN: 1000.0000 ug | Freq: Once | INTRAMUSCULAR | Status: DC

## 2018-05-30 NOTE — Progress Notes (Signed)
   Patient in to nurse clinic for weekly Vitamin B-12 injection 1 of 4 ordered by PCP. Given IM to left deltoid. Patient tolerated well. She will schedule appt for nurse clinic for next week for next injection.  Danley Danker, RN [facility] RN)

## 2018-06-07 ENCOUNTER — Ambulatory Visit (INDEPENDENT_AMBULATORY_CARE_PROVIDER_SITE_OTHER): Payer: Medicaid Other

## 2018-06-07 DIAGNOSIS — K50919 Crohn's disease, unspecified, with unspecified complications: Secondary | ICD-10-CM

## 2018-06-07 DIAGNOSIS — E538 Deficiency of other specified B group vitamins: Secondary | ICD-10-CM | POA: Diagnosis not present

## 2018-06-07 MED ORDER — CYANOCOBALAMIN 1000 MCG/ML IJ SOLN: 1000.0000 ug | Freq: Once | INTRAMUSCULAR | Status: DC

## 2018-06-07 NOTE — Addendum Note (Signed)
Addended by: Dorna Bloom on: 06/07/2018 10:05 AM   Modules accepted: Orders

## 2018-06-07 NOTE — Progress Notes (Signed)
Pt presents in nurse clinic weekly Vitamin B12 injection. 2 of 4 ordered by PCP. Given IM to right deltoid. Pt tolerated well. Apt scheduled for 3rd injection for 11/27 in nurse clinic, reminder card given.

## 2018-06-13 ENCOUNTER — Ambulatory Visit (INDEPENDENT_AMBULATORY_CARE_PROVIDER_SITE_OTHER): Payer: Medicaid Other | Admitting: *Deleted

## 2018-06-13 DIAGNOSIS — E538 Deficiency of other specified B group vitamins: Secondary | ICD-10-CM

## 2018-06-13 DIAGNOSIS — K50919 Crohn's disease, unspecified, with unspecified complications: Secondary | ICD-10-CM

## 2018-06-13 MED ORDER — CYANOCOBALAMIN 1000 MCG/ML IJ SOLN: 1000.0000 ug | Freq: Once | INTRAMUSCULAR | Status: DC

## 2018-06-13 NOTE — Progress Notes (Signed)
B12 given and next appt scheduled for 06/22/18 for next injection.  After next appt she will need injection monthly. Meranda Dechaine, Salome Spotted, CMA

## 2018-06-22 ENCOUNTER — Ambulatory Visit: Payer: Medicaid Other

## 2018-06-27 ENCOUNTER — Ambulatory Visit (INDEPENDENT_AMBULATORY_CARE_PROVIDER_SITE_OTHER): Payer: Medicaid Other | Admitting: *Deleted

## 2018-06-27 DIAGNOSIS — E538 Deficiency of other specified B group vitamins: Secondary | ICD-10-CM | POA: Diagnosis present

## 2018-06-27 MED ORDER — CYANOCOBALAMIN 1000 MCG/ML IJ SOLN
1000.0000 ug | Freq: Once | INTRAMUSCULAR | Status: AC
  Administered 2018-06-27: 1000 ug via INTRAMUSCULAR

## 2018-06-27 NOTE — Progress Notes (Signed)
Pt is here today for her last weekly b12 injections.  She is aware of appt with Riccio on 07/06/18 @ 9:50. Fleeger, Salome Spotted, CMA

## 2018-07-06 ENCOUNTER — Other Ambulatory Visit: Payer: Self-pay

## 2018-07-06 ENCOUNTER — Ambulatory Visit (INDEPENDENT_AMBULATORY_CARE_PROVIDER_SITE_OTHER): Payer: Medicaid Other | Admitting: Family Medicine

## 2018-07-06 VITALS — BP 130/82 | HR 67 | Temp 97.9°F | Ht 64.0 in | Wt 174.2 lb

## 2018-07-06 DIAGNOSIS — F329 Major depressive disorder, single episode, unspecified: Secondary | ICD-10-CM | POA: Diagnosis not present

## 2018-07-06 DIAGNOSIS — F419 Anxiety disorder, unspecified: Secondary | ICD-10-CM

## 2018-07-06 DIAGNOSIS — E538 Deficiency of other specified B group vitamins: Secondary | ICD-10-CM | POA: Diagnosis not present

## 2018-07-06 DIAGNOSIS — F32A Depression, unspecified: Secondary | ICD-10-CM

## 2018-07-06 MED ORDER — ESCITALOPRAM OXALATE 5 MG PO TABS: 5.0000 mg | ORAL_TABLET | Freq: Every day | ORAL | 2 refills | Status: DC

## 2018-07-06 MED ORDER — CYANOCOBALAMIN 1000 MCG/ML IJ SOLN
1000.0000 ug | INTRAMUSCULAR | Status: DC
  Administered 2018-07-27 – 2019-07-01 (×5): 1000 ug via INTRAMUSCULAR

## 2018-07-06 NOTE — Assessment & Plan Note (Signed)
  Patient reported improvement with lexapro but had side effects. Discussed trying lower dose of 5 mg daily and patient is agreeable. New rx sent in for her. Asked her to call me if she still has feelings of fatigue or brain fog and we can see her sooner to switch, otherwise Follow up 3 months

## 2018-07-06 NOTE — Assessment & Plan Note (Signed)
  S/p 4 weeks of 1073mg IM, will check CBC and B12 levels today. Next due for monthly injection 1/10, appt made in nurse clinic

## 2018-07-06 NOTE — Progress Notes (Signed)
    Subjective:    Patient ID: Marissa Blankenship, female    DOB: February 08, 1966, 52 y.o.   MRN: [id number]   CC: mood fup  HPI: received 1000 mcg IM B12 a week for past 4 weeks per GI recs as she has crohn's. Now due to continue 1000 mcg per month. Next shot 07/28/17. She does endorse improved energy levels since getting injections. No adverse effects.  At last visit with me 9/30 was started on lexapro. She reports the medication helped her mood and her sleep a lot but she experienced daytime fatigue and "fogginess" so she stopped taking it. Her mood and anxiety are now bad again and she is again having trouble sleeping, so she re-started the medication 4 days ago. No SI/HI  Smoking status reviewed- former smoker  Review of Systems- see HPI   Objective:  BP 130/82   Pulse 67   Temp 97.9 F (36.6 C) (Oral)   Ht 5' 4"  (1.626 m)   Wt 174 lb 4 oz (79 kg)   SpO2 98%   BMI 29.91 kg/m  Vitals and nursing note reviewed  General: well nourished, in no acute distress HEENT: normocephalic, MMM Cardiac: RRR, clear S1 and S2, no murmurs, rubs, or gallops Respiratory: clear to auscultation bilaterally, no increased work of breathing Extremities: no edema or cyanosis Skin: warm and dry, no rashes noted Neuro: alert and oriented, no focal deficits Psych: mood and affect normal   Assessment & Plan:    Anxiety and depression  Patient reported improvement with lexapro but had side effects. Discussed trying lower dose of 5 mg daily and patient is agreeable. New rx sent in for her. Asked her to call me if she still has feelings of fatigue or brain fog and we can see her sooner to switch, otherwise Follow up 3 months  B12 deficiency  S/p 4 weeks of 1063mg IM, will check CBC and B12 levels today. Next due for monthly injection 1/10, appt made in nurse clinic    Return in about 3 months (around 10/05/2018).   ALucila Maine DO Family Medicine Resident PGY-3

## 2018-07-06 NOTE — Patient Instructions (Addendum)
  Nice to see you today! We'll check blood counts and B12 levels today. I'll let you know the results either by phone or mail  We'll do next B12 injection 07/27/18  Please use up your 10 mg lexapro- either cut it in half or take it every other day then start the 5 mg daily.  We'll check in in 3 months or sooner if you feel you are having issues with the lexapro.  If you have questions or concerns please do not hesitate to call at [phone].  Lucila Maine, DO PGY-3, [facility] 07/06/2018 10:28 AM

## 2018-07-07 LAB — CBC
HEMATOCRIT: 39.7 % (ref 34.0–46.6)
Hemoglobin: 12.5 g/dL (ref 11.1–15.9)
MCH: 27.2 pg (ref 26.6–33.0)
MCHC: 31.5 g/dL (ref 31.5–35.7)
MCV: 86 fL (ref 79–97)
PLATELETS: 239 10*3/uL (ref 150–450)
RBC: 4.6 x10E6/uL (ref 3.77–5.28)
RDW: 14.2 % (ref 12.3–15.4)
WBC: 6.1 10*3/uL (ref 3.4–10.8)

## 2018-07-07 LAB — VITAMIN B12: Vitamin B-12: 755 pg/mL (ref 232–1245)

## 2018-07-16 ENCOUNTER — Encounter: Payer: Self-pay | Admitting: Family Medicine

## 2018-07-16 NOTE — Progress Notes (Signed)
  Letter mailed to patient with lab results  Lucila Maine, DO PGY-3, [facility] 07/16/2018 9:31 AM

## 2018-07-25 ENCOUNTER — Telehealth: Payer: Self-pay | Admitting: Gastroenterology

## 2018-07-25 NOTE — Telephone Encounter (Signed)
Pt states she is due for a Stelara injection today, but her pharmacy is OOS and cannot order until Friday.  Please CB to advise.

## 2018-07-25 NOTE — Telephone Encounter (Signed)
Pts pharmacy cannot get Stelara until Friday and pt wanted to see if this was ok, discussed with pt that it should not be a problem.

## 2018-07-26 ENCOUNTER — Encounter: Payer: Self-pay | Admitting: Family Medicine

## 2018-07-27 ENCOUNTER — Ambulatory Visit (INDEPENDENT_AMBULATORY_CARE_PROVIDER_SITE_OTHER): Payer: Medicaid Other

## 2018-07-27 ENCOUNTER — Telehealth: Payer: Self-pay

## 2018-07-27 DIAGNOSIS — E538 Deficiency of other specified B group vitamins: Secondary | ICD-10-CM | POA: Diagnosis present

## 2018-07-27 DIAGNOSIS — M722 Plantar fascial fibromatosis: Secondary | ICD-10-CM

## 2018-07-27 MED ORDER — CYANOCOBALAMIN 1000 MCG/ML IJ SOLN: 1000.0000 ug | Freq: Once | INTRAMUSCULAR | Status: DC

## 2018-07-27 NOTE — Telephone Encounter (Signed)
Referral made to sports med for plantar fasciitis

## 2018-07-27 NOTE — Progress Notes (Signed)
   Patient in to nurse clinic for monthly Vitamin B12 injection. Given left delotid, patient tolerated well. She will make an appt for next month at the front desk. Danley Danker, RN [facility] RN)

## 2018-07-27 NOTE — Telephone Encounter (Signed)
Patient asks for referral for plantar fasciitis, left worse than right.  Danley Danker, RN [facility] RN)

## 2018-08-14 ENCOUNTER — Telehealth: Payer: Self-pay | Admitting: Gastroenterology

## 2018-08-14 ENCOUNTER — Other Ambulatory Visit: Payer: Self-pay | Admitting: Gastroenterology

## 2018-08-14 MED ORDER — ONDANSETRON HCL 8 MG PO TABS: 8.0000 mg | ORAL_TABLET | Freq: Three times a day (TID) | ORAL | 3 refills | Status: DC | PRN

## 2018-08-14 NOTE — Telephone Encounter (Signed)
Pt has used Zofran 4 mg in the past.

## 2018-08-14 NOTE — Telephone Encounter (Signed)
Pt states that she has been very nauseous lately, would like to have prescribed for it, [facility] uses [facility] in [place].

## 2018-08-14 NOTE — Telephone Encounter (Signed)
Patient has been notified and aware.

## 2018-08-14 NOTE — Telephone Encounter (Signed)
I prescribed zofran, and gave her the higher 8 mg dose.Can take one every 8 hours as needed. I also refilled prilosec because pharmacy sent a refill request. Please let her know.

## 2018-08-16 ENCOUNTER — Encounter: Payer: Self-pay | Admitting: Sports Medicine

## 2018-08-16 ENCOUNTER — Ambulatory Visit: Payer: Medicaid Other | Admitting: Sports Medicine

## 2018-08-16 VITALS — BP 132/82 | Ht 64.0 in | Wt 176.0 lb

## 2018-08-16 DIAGNOSIS — M7741 Metatarsalgia, right foot: Secondary | ICD-10-CM

## 2018-08-16 DIAGNOSIS — M7742 Metatarsalgia, left foot: Secondary | ICD-10-CM | POA: Diagnosis not present

## 2018-08-16 NOTE — Progress Notes (Signed)
  Marissa Blankenship - 53 y.o. female MRN [id number]  Date of birth: 23-Oct-1965    SUBJECTIVE:      Chief Complaint: Bilateral foot pain  HPI:  53 year old female with left greater than right foot pain.  She has had this pain for about 3 to 4 months.  She localizes her pain to the forefoot of both feet.  She describes up to 5/10 pain with pushing off during her stride when walking.  Pain is generally worse after walking.  No significant pain with first few steps of the day.  To alleviate pain, she states she occasionally has to walk on the outside of her foot.  She occasionally uses Tylenol as needed but this is overall unhelpful.  She does have a history of plantar fasciitis and she has been trying to do the stretches and use a ball for massage.  Overall this is done little to help her pain.  She denies any specific injury.  No associated bruising or swelling.  No numbness or tingling.  No weakness in the lower extremities.  No associated skin changes   ROS:     See HPI  PERTINENT  PMH / PSH FH / / SH:  Past Medical, Surgical, Social, and Family History Reviewed & Updated in the EMR.    OBJECTIVE: BP 132/82   Ht 5' 4"  (1.626 m)   Wt 176 lb (79.8 kg)   BMI 30.21 kg/m   Physical Exam:  Vital signs are reviewed.  GEN: Alert and oriented, NAD Pulm: Breathing unlabored PSY: normal mood, congruent affect  MSK: Left foot: Inspection:  No obvious bony deformity.  No swelling, erythema, or bruising. Palpation: Reproducible tenderness over the second and third metatarsal heads.  No pain at the medial calcaneal tubercle or over the plantar midfoot ROM: Full  ROM of the ankle. Strength: 5/5 strength ankle in all planes Neurovascular: N/V intact distally in the lower extremity Special tests: Negative squeeze  Right foot Inspection:  No obvious bony deformity.  No swelling, erythema, or bruising.  Palpation: Reproducible tenderness primarily over the second metatarsal head, this is less tender than  the left.  No pain at the medial calcaneal tubercle or over the plantar midfoot ROM: Full  ROM of the ankle. Strength: 5/5 strength ankle in all planes Neurovascular: N/V intact distally in the lower extremity   ASSESSMENT & PLAN:  1.  Bilateral foot pain due to metatarsalgia. -Patient provided with and instructed on how to place metatarsal pads -Continue Tylenol or NSAIDs as needed -Follow-up in 4 weeks if pain persists   I was the preceptor for this visit and available for immediate consultation Shellia Cleverly, DO

## 2018-08-16 NOTE — Patient Instructions (Signed)
Your foot pain is from metatarsalgia We will treat this initially with metatarsal pads - As discussed, find a pair of sneakers that you want to wear regularly - Undo all of the laces to make it easier to put your foot in the shoe -Using an old lipstick, Mark spot on your foot where you are feeling the pain - Gently placed your foot in the shoe and stepped down.  Try to avoid sliding your foot along the shoe which will smear the lipstick mark. -After marking insole, remove the insole from the shoe - Identify the appropriate metatarsal pad (left or right).  Remove only part of the paper backing from the pad.  Placed this on the insole just behind the lipstick mark (the list that Elta [place] should still be visible when the pad is placed). - Reinsert the insole to the shoe. - Place your foot in the shoe.  The metatarsal pad should be located just behind the area of pain.  If it is pressing on the painful area, you may need to move it back slightly.  Also it should be roughly centered from left to right on the insole.  You may continue to reposition it until you find a comfortable spot.  Once you find the appropriate spot, remove the remainder of the paper backing and adhere the entire pad to the insole.  You may continue Tylenol as needed for pain We will have you follow-up in 4 weeks if needed.

## 2018-08-28 ENCOUNTER — Ambulatory Visit (INDEPENDENT_AMBULATORY_CARE_PROVIDER_SITE_OTHER): Payer: Medicaid Other | Admitting: *Deleted

## 2018-08-28 DIAGNOSIS — E538 Deficiency of other specified B group vitamins: Secondary | ICD-10-CM | POA: Diagnosis present

## 2018-08-28 NOTE — Progress Notes (Signed)
Pt is here for a b12 injection today.    Date of last office visit that b12 was discussed 07/06/2018  Last injection was 07/27/2017  Injection given in right deltoid, pt tolerated well and will stop at front desk to schedule next appt. Fleeger, Salome Spotted, CMA

## 2018-09-26 ENCOUNTER — Ambulatory Visit: Payer: Medicaid Other

## 2018-10-01 ENCOUNTER — Telehealth: Payer: Self-pay | Admitting: Gastroenterology

## 2018-10-01 NOTE — Telephone Encounter (Signed)
Pt states her neighbor got the [place] for her on Thursday but she did not get it until Sunday. The ice packs had melted and the pharmacy told her the temp was probably out of range and not to use it. She was told by the pharmacy to contact the manufacturer, they told her they would reimburse her copay but that was all they could do. Discussed with pt that she will need to contact her insurance company to explain what happened, if we sent in script now they would not fill it as it would be to soon. Pt verbalized understanding.

## 2018-10-01 NOTE — Telephone Encounter (Signed)
Pt called and stated that she recv her medication how ever it was left out and the ice pack melted and was out of temp range. She stated that she was advised by the pharm. that they can reimburse copay but can not replace medication so she is needing some advice

## 2018-10-04 ENCOUNTER — Ambulatory Visit (INDEPENDENT_AMBULATORY_CARE_PROVIDER_SITE_OTHER): Payer: Medicare Other

## 2018-10-04 ENCOUNTER — Other Ambulatory Visit: Payer: Self-pay

## 2018-10-04 DIAGNOSIS — E538 Deficiency of other specified B group vitamins: Secondary | ICD-10-CM | POA: Diagnosis not present

## 2018-10-04 MED ORDER — CYANOCOBALAMIN 1000 MCG/ML IJ SOLN
1000.0000 ug | Freq: Once | INTRAMUSCULAR | Status: AC
  Administered 2018-10-04: 1000 ug via INTRAMUSCULAR

## 2018-10-04 NOTE — Telephone Encounter (Signed)
Pt stated that her ins has changed and she now has Medicare as her primary.  Pt was advised by insurance that we will have to mail a copy of Medicare card to [facility].  I will place the copy on your desk.

## 2018-10-04 NOTE — Telephone Encounter (Signed)
Copy of card faxed to [facility].

## 2018-10-04 NOTE — Progress Notes (Signed)
   Patient in to nurse clinic for monthly Vitamin B-12 injection. Injection given left deltoid. Tolerated well. Patient advised to make appointment at front desk for next month.  Danley Danker, RN [facility] RN)

## 2018-10-09 ENCOUNTER — Telehealth: Payer: Self-pay | Admitting: Gastroenterology

## 2018-10-09 NOTE — Telephone Encounter (Signed)
[place] Rn wants to speak with the nurse about the pt medication

## 2018-10-09 NOTE — Telephone Encounter (Signed)
Pts shipment of Stelara was not refrigerated and cannot be used, medicaid will not approve a refill early. [facility] will try and get her some samples. State they will be back in touch once they contact a rep with Stelara.

## 2018-10-11 ENCOUNTER — Telehealth: Payer: Self-pay | Admitting: Gastroenterology

## 2018-10-11 NOTE — Telephone Encounter (Signed)
Left message to call back  

## 2018-10-11 NOTE — Telephone Encounter (Signed)
Stelara Sample provided by drug rep.

## 2018-10-11 NOTE — Telephone Encounter (Signed)
Marissa Blankenship called stating she was returning your call

## 2018-10-11 NOTE — Telephone Encounter (Signed)
Stelara Sample provided from Drug rep. Pt will come to pick this up.

## 2018-10-17 ENCOUNTER — Other Ambulatory Visit: Payer: Self-pay | Admitting: Family Medicine

## 2018-10-22 ENCOUNTER — Other Ambulatory Visit: Payer: Self-pay

## 2018-10-22 ENCOUNTER — Ambulatory Visit (INDEPENDENT_AMBULATORY_CARE_PROVIDER_SITE_OTHER): Payer: Medicare Other

## 2018-10-22 DIAGNOSIS — E538 Deficiency of other specified B group vitamins: Secondary | ICD-10-CM | POA: Diagnosis present

## 2018-10-22 MED ORDER — CYANOCOBALAMIN 1000 MCG/ML IJ SOLN
1000.0000 ug | Freq: Once | INTRAMUSCULAR | Status: AC
  Administered 2018-10-22: 1000 ug via INTRAMUSCULAR

## 2018-10-22 NOTE — Progress Notes (Signed)
Pt presents in nurse clinic for monthly B12 injection. Injection given in RD, site unremarkable. Next B12 Injection scheduled for 5/6, reminder card given.

## 2018-11-19 ENCOUNTER — Telehealth: Payer: Self-pay | Admitting: Gastroenterology

## 2018-11-19 ENCOUNTER — Other Ambulatory Visit: Payer: Self-pay | Admitting: Gastroenterology

## 2018-11-19 ENCOUNTER — Other Ambulatory Visit: Payer: Self-pay

## 2018-11-19 DIAGNOSIS — K5000 Crohn's disease of small intestine without complications: Secondary | ICD-10-CM

## 2018-11-19 DIAGNOSIS — D500 Iron deficiency anemia secondary to blood loss (chronic): Secondary | ICD-10-CM

## 2018-11-19 NOTE — Telephone Encounter (Signed)
Pt has been scheduled for tele-video visit with [employer]. Pt aware to have labs drawn this week.

## 2018-11-19 NOTE — Telephone Encounter (Signed)
I received a pharmacy refill request for Marissa Blankenship's omeprazole.  I have refilled it, but also note in chart review that she last saw me October 2019.  Given her Crohn's disease and immune suppressive therapy for that, she needs follow-up with me.  I would prefer to do it by teleconference because she is on immunosuppressive medication.  She is due for the following labs: CBC with differential, CMP, iron/TIBC and ferritin  Please arrange lab tests and a teleconference with me.

## 2018-11-21 ENCOUNTER — Other Ambulatory Visit: Payer: Self-pay

## 2018-11-21 ENCOUNTER — Ambulatory Visit (INDEPENDENT_AMBULATORY_CARE_PROVIDER_SITE_OTHER): Payer: Medicare Other | Admitting: *Deleted

## 2018-11-21 ENCOUNTER — Other Ambulatory Visit (INDEPENDENT_AMBULATORY_CARE_PROVIDER_SITE_OTHER): Payer: Medicare Other

## 2018-11-21 DIAGNOSIS — D500 Iron deficiency anemia secondary to blood loss (chronic): Secondary | ICD-10-CM

## 2018-11-21 DIAGNOSIS — K5000 Crohn's disease of small intestine without complications: Secondary | ICD-10-CM

## 2018-11-21 DIAGNOSIS — E538 Deficiency of other specified B group vitamins: Secondary | ICD-10-CM | POA: Diagnosis present

## 2018-11-21 LAB — COMPREHENSIVE METABOLIC PANEL
ALT: 10 U/L (ref 0–35)
AST: 14 U/L (ref 0–37)
Albumin: 4.1 g/dL (ref 3.5–5.2)
Alkaline Phosphatase: 103 U/L (ref 39–117)
BUN: 16 mg/dL (ref 6–23)
CO2: 28 mEq/L (ref 19–32)
Calcium: 8.9 mg/dL (ref 8.4–10.5)
Chloride: 103 mEq/L (ref 96–112)
Creatinine, Ser: 0.66 mg/dL (ref 0.40–1.20)
GFR: 114.32 mL/min (ref >60.00)
Glucose, Bld: 93 mg/dL (ref 70–99)
Potassium: 3.7 mEq/L (ref 3.5–5.1)
Sodium: 140 mEq/L (ref 135–145)
Total Bilirubin: 0.4 mg/dL (ref 0.2–1.2)
Total Protein: 7.2 g/dL (ref 6.0–8.3)

## 2018-11-21 LAB — CBC WITH DIFFERENTIAL/PLATELET
Basophils Absolute: 0 10*3/uL (ref 0.0–0.1)
Basophils Relative: 0.5 % (ref 0.0–3.0)
Eosinophils Absolute: 0.2 10*3/uL (ref 0.0–0.7)
Eosinophils Relative: 3.1 % (ref 0.0–5.0)
HCT: 39.1 % (ref 36.0–46.0)
Hemoglobin: 13 g/dL (ref 12.0–15.0)
Lymphocytes Relative: 22.7 % (ref 12.0–46.0)
Lymphs Abs: 1.2 10*3/uL (ref 0.7–4.0)
MCHC: 33.2 g/dL (ref 30.0–36.0)
MCV: 87 fl (ref 78.0–100.0)
Monocytes Absolute: 0.5 10*3/uL (ref 0.1–1.0)
Monocytes Relative: 8.4 % (ref 3.0–12.0)
Neutro Abs: 3.5 10*3/uL (ref 1.4–7.7)
Neutrophils Relative %: 65.3 % (ref 43.0–77.0)
Platelets: 233 10*3/uL (ref 150.0–400.0)
RBC: 4.5 Mil/uL (ref 3.87–5.11)
RDW: 14.9 % (ref 11.5–15.5)
WBC: 5.3 10*3/uL (ref 4.0–10.5)

## 2018-11-21 LAB — IBC + FERRITIN
Ferritin: 25.5 ng/mL (ref 10.0–291.0)
Iron: 58 ug/dL (ref 42–145)
Saturation Ratios: 14.9 % — ABNORMAL LOW (ref 20.0–50.0)
Transferrin: 278 mg/dL (ref 212.0–360.0)

## 2018-11-21 NOTE — Progress Notes (Signed)
Pt is here for a b12 injection today.    Date of last office visit that b12 was discussed 07/06/18  Last injection was 10/22/18  Injection given in left deltoid, pt tolerated well and will followup appt with Md for next injection per last OVN. Christen Bame, CMA

## 2018-11-23 ENCOUNTER — Other Ambulatory Visit: Payer: Self-pay

## 2018-11-23 ENCOUNTER — Ambulatory Visit (INDEPENDENT_AMBULATORY_CARE_PROVIDER_SITE_OTHER): Payer: Medicare Other | Admitting: Gastroenterology

## 2018-11-23 DIAGNOSIS — Z79899 Other long term (current) drug therapy: Secondary | ICD-10-CM

## 2018-11-23 DIAGNOSIS — K219 Gastro-esophageal reflux disease without esophagitis: Secondary | ICD-10-CM | POA: Diagnosis not present

## 2018-11-23 DIAGNOSIS — K5000 Crohn's disease of small intestine without complications: Secondary | ICD-10-CM | POA: Diagnosis not present

## 2018-11-23 DIAGNOSIS — R110 Nausea: Secondary | ICD-10-CM | POA: Diagnosis not present

## 2018-11-23 DIAGNOSIS — D500 Iron deficiency anemia secondary to blood loss (chronic): Secondary | ICD-10-CM | POA: Diagnosis not present

## 2018-11-23 NOTE — Progress Notes (Signed)
This patient contacted our office requesting a physician telemedicine consultation regarding clinical questions and/or test results.   Participants on the conference : myself and patient   The patient consented to this consultation and was aware that a charge will be placed through their insurance.  I was in my office and the patient was at home   Encounter time:  Total time 35 minutes, with 26 minutes spent with patient on [facility]   _____________________________________________________________________________________________               [facility] Progress Note  Chief Complaint: Small bowel Crohn's disease  Subjective  History: Resection summer 2018, started Stelara August 2019 Last visit Oct 2019, lots of heartburn.  Under good control on daily omeprazole. Last Stelara SQ injection 8 weeks ago, about to take the next dose.No reactions. Occ'l brief RLQ pain. Most days 2-3 BM's in AM, then typically done for the day unless " I eat something that doesn't agree with me".   I am glad to hear that Mitzi is doing well overall.  Her appetite is good, heartburn under control, weight stable, denies dysphagia or odynophagia.  She still has occasional nausea and has to be very careful about what she eats, and also uses ondansetron occasionally.  She has been taking B12 injections, but had to stop the iron because it worsened constipation.  Laretha has also been careful with social distancing because she is on immunosuppressive therapy.  ROS: Cardiovascular:  no chest pain Respiratory: no dyspnea No dysuria Mood stable Some difficulty sleeping at times  Remainder of systems negative except as above The patient's Past Medical, Family and Social History were reviewed and are on file in the EMR.  Objective:  Med list reviewed  Current Outpatient Medications:  .  amLODipine (NORVASC) 10 MG tablet, TAKE 1 TABLET BY MOUTH EVERY DAY, Disp: 90 tablet, Rfl: 3 .  calcium-vitamin  D (OSCAL WITH D) 500-200 MG-UNIT tablet, Take 1 tablet by mouth 2 (two) times daily., Disp: 60 tablet, Rfl: 6 .  escitalopram (LEXAPRO) 5 MG tablet, TAKE 1 TABLET BY MOUTH EVERY DAY, Disp: 30 tablet, Rfl: 2 .  famotidine-calcium carbonate-magnesium hydroxide (PEPCID COMPLETE) 10-800-165 MG chewable tablet, Chew 1 tablet by mouth daily., Disp: , Rfl:  .  ferrous sulfate 325 (65 FE) MG tablet, Take 1 tablet (325 mg total) by mouth 2 (two) times daily with a meal. (Patient taking differently: Take 325 mg by mouth daily. ), Disp: 60 tablet, Rfl: 3 .  lip balm (CARMEX) ointment, Apply 1 application topically 2 (two) times daily., Disp: 7 g, Rfl: 0 .  omeprazole (PRILOSEC) 40 MG capsule, TAKE 1 CAPSULE BY MOUTH EVERY DAY, Disp: 30 capsule, Rfl: 2 .  ondansetron (ZOFRAN) 8 MG tablet, Take 1 tablet (8 mg total) by mouth every 8 (eight) hours as needed for nausea or vomiting., Disp: 45 tablet, Rfl: 3 .  ustekinumab (STELARA) 90 MG/ML SOSY injection, Inject 1 mL (90 mg total) into the skin every 8 (eight) weeks., Disp: 1 Syringe, Rfl: 6  Current Facility-Administered Medications:  .  cyanocobalamin ((VITAMIN B-12)) injection 1,000 mcg, 1,000 mcg, Intramuscular, Q30 days, Riccio, Angela C, DO, 1,000 mcg at 11/21/18 1052  Stopped taking the iron because it upset her stomach  No exam-virtual visit  Recent Labs:  CBC Latest Ref Rng & Units 11/21/2018 07/06/2018 05/04/2018  WBC 4.0 - 10.5 K/uL 5.3 6.1 4.4  Hemoglobin 12.0 - 15.0 g/dL 13.0 12.5 13.1  Hematocrit 36.0 - 46.0 % 39.1 39.7  39.6  Platelets 150.0 - 400.0 K/uL 233.0 239 197.0   CMP Latest Ref Rng & Units 11/21/2018 09/19/2017 02/03/2017  Glucose 70 - 99 mg/dL 93 83 80  BUN 6 - 23 mg/dL 16 9 7   Creatinine 0.40 - 1.20 mg/dL 0.66 0.58 0.60  Sodium 135 - 145 mEq/L 140 142 134(L)  Potassium 3.5 - 5.1 mEq/L 3.7 4.3 3.9  Chloride 96 - 112 mEq/L 103 105 102  CO2 19 - 32 mEq/L 28 26 25   Calcium 8.4 - 10.5 mg/dL 8.9 9.2 8.3(L)  Total Protein 6.0 - 8.3 g/dL  7.2 7.1 5.9(L)  Total Bilirubin 0.2 - 1.2 mg/dL 0.4 0.2 0.7  Alkaline Phos 39 - 117 U/L 103 100 52  AST 0 - 37 U/L 14 20 14(L)  ALT 0 - 35 U/L 10 13 11(L)  Albumin 4.1  Iron/TIBC/Ferritin/ %Sat Iron 58, 15% saturation, ferritin 25 (similar values to 6 months ago)   @ASSESSMENTPLANBEGIN @ Assessment: Encounter Diagnoses  Name Primary?  . Crohn's disease of small intestine without complication ([place]) Yes  . Iron deficiency anemia due to chronic blood loss   . Nausea without vomiting   . Gastroesophageal reflux disease, esophagitis presence not specified   . Long term current use of immunosuppressive drug     She seems to be doing well overall. Digestive upset from iron supplements, so I recommended she pick up a low dose iron/B12/folic acid supplement called blood builder, and take 1 tablet twice a week.  Although she is not anemic, her iron levels are at the low end of normal.  Plan: Continue current therapy.  All lab work was reviewed with her.  We will set a clinical reminder to see her for an in person clinic visit in 3 months, at which time we will plan to arrange a colonoscopy sometime in the fall to assess Crohn's activity on Stelara.   [name]

## 2018-12-24 ENCOUNTER — Ambulatory Visit (INDEPENDENT_AMBULATORY_CARE_PROVIDER_SITE_OTHER): Payer: Medicare Other

## 2018-12-24 ENCOUNTER — Other Ambulatory Visit: Payer: Self-pay

## 2018-12-24 DIAGNOSIS — E538 Deficiency of other specified B group vitamins: Secondary | ICD-10-CM

## 2018-12-24 MED ORDER — CYANOCOBALAMIN 1000 MCG/ML IJ SOLN
1000.0000 ug | Freq: Once | INTRAMUSCULAR | Status: AC
  Administered 2019-01-21: 13:00:00 1000 ug via INTRAMUSCULAR

## 2018-12-24 NOTE — Addendum Note (Signed)
Addended by: Londell Moh T on: 12/24/2018 01:57 PM   Modules accepted: Orders

## 2018-12-24 NOTE — Progress Notes (Signed)
Pt is here for a b12 injection today.    Date of last office visit that b12 was discussed 07/06/18  Last injection was 11/21/18  Pt does not have an appt with Dr. Estil Daft. Spoke with Dr. Ardelia Mems, who said to go ahead an give B12 but let pt know she needs to see the doctor for next injection. Injection given in Right deltoid, pt tolerated well and will followup appt with Md for next injection. Ottis Stain, CMA

## 2019-01-15 ENCOUNTER — Ambulatory Visit: Payer: Medicare Other | Admitting: Family Medicine

## 2019-01-15 ENCOUNTER — Other Ambulatory Visit: Payer: Self-pay | Admitting: Family Medicine

## 2019-01-15 ENCOUNTER — Other Ambulatory Visit: Payer: Self-pay

## 2019-01-15 ENCOUNTER — Ambulatory Visit (INDEPENDENT_AMBULATORY_CARE_PROVIDER_SITE_OTHER): Payer: Medicare Other | Admitting: Student in an Organized Health Care Education/Training Program

## 2019-01-15 VITALS — BP 114/82 | HR 91

## 2019-01-15 DIAGNOSIS — E538 Deficiency of other specified B group vitamins: Secondary | ICD-10-CM | POA: Diagnosis not present

## 2019-01-15 NOTE — Progress Notes (Signed)
   CC: follow up B12 deficiency  HPI: Marissa Blankenship is a 53 y.o. female with PMH significant for crohn's disease, HTN, who presents to [facility] today for follow up B12  Deficiency.  B12 deficiency was diagnosed in 05/2018 by PCP and patient underwent B12 injections qWeekly x4, then was transitioned to [place] injections. The patient was advised to come in for follow up prior to her next injection, which prompted her to make today's appointment.  The patient reports that she has been feeling well. She has not noticed skin or tongue changes. No changes in energy. No neuropathy or paresthesias. No palpitations or dizziness. She recently had lab work completed at another office and Hgb was normal at 13.   Review of Symptoms:  See HPI for ROS.   CC, SH/smoking status, and VS noted.  Objective: BP 114/82   Pulse 91   SpO2 99%  GEN: NAD, alert, cooperative, and pleasant. EYE: no conjunctival injection, pupils equally round and reactive to light ENMT: normal tympanic light reflex, no nasal polyps,no rhinorrhea, no pharyngeal erythema or exudates, no glossitis, mucous membranes moist NECK: full ROM, no thyromegaly RESPIRATORY: clear to auscultation bilaterally with no wheezes, rhonchi or rales, good effort CV: RRR, no m/r/g, no peripheral edema GI: soft, non-tender, non-distended, no hepatosplenomegaly SKIN: warm and dry, no rashes or lesions, no jaundice NEURO: II-XII grossly intact, normal gait, peripheral sensation intact PSYCH: AAOx3, appropriate affect  Assessment and plan:  B12 deficiency Due to chronic malabsorption from Crohn's disease. Patient currently asymptomatic.  CBC reassuring with Hgb at 13.1 recently. We can check B12 level. If B12 is normal or low, would be reasonable to proceed with next injection. If B12 is high, we may be able to space injections to q2-3 months.  Orders Placed This Encounter  Procedures  . Vitamin B12   Everrett Coombe, MD,MS,  PGY3 01/16/2019 4:42 PM

## 2019-01-15 NOTE — Patient Instructions (Signed)
It was a pleasure seeing you today in our clinic. Today we discussed your B12 deficiency. Here is the treatment plan we have discussed and agreed upon together:  We drew blood work at today's visit. I will call or send you a letter with these results. If you do not hear from me within the next week, please give our office a call.  Our clinic's number is [phone]. Please call with questions or concerns about what we discussed today.  Be well, Dr. Burr Medico

## 2019-01-15 NOTE — Assessment & Plan Note (Addendum)
Due to chronic malabsorption from Crohn's disease. Patient currently asymptomatic.  CBC reassuring with Hgb at 13.1 recently. We can check B12 level. If B12 is normal or low, would be reasonable to proceed with next injection. If B12 is high, we may be able to space injections to q2-3 months.

## 2019-01-16 LAB — VITAMIN B12: Vitamin B-12: 760 pg/mL (ref 232–1245)

## 2019-01-17 ENCOUNTER — Other Ambulatory Visit: Payer: Self-pay | Admitting: Student in an Organized Health Care Education/Training Program

## 2019-01-17 ENCOUNTER — Telehealth: Payer: Self-pay

## 2019-01-17 NOTE — Telephone Encounter (Signed)
LVM for pt to call the office. If pt calls, please schedule a nurse visit for a B12 injection. Ottis Stain, CMA

## 2019-01-17 NOTE — Telephone Encounter (Signed)
-----   Message from Everrett Coombe, MD sent at 01/17/2019  9:35 AM EDT ----- Please call the patient and have her come in for a nurse visit B12 injection. I will place the order.

## 2019-01-21 ENCOUNTER — Other Ambulatory Visit: Payer: Self-pay

## 2019-01-21 ENCOUNTER — Ambulatory Visit (INDEPENDENT_AMBULATORY_CARE_PROVIDER_SITE_OTHER): Payer: Medicare Other | Admitting: *Deleted

## 2019-01-21 DIAGNOSIS — E538 Deficiency of other specified B group vitamins: Secondary | ICD-10-CM

## 2019-01-21 NOTE — Progress Notes (Signed)
Pt is here for a b12 injection today.    Date of last office visit that b12 was discussed 01/15/19  Last injection was 12/24/18  Injection given in left deltoid, pt tolerated well and will stop at front desk to schedule next appt. Christen Bame, CMA

## 2019-01-21 NOTE — Telephone Encounter (Signed)
Pt has appointment today for this. April Zimmerman Rumple, CMA

## 2019-01-28 ENCOUNTER — Telehealth: Payer: Self-pay

## 2019-01-28 ENCOUNTER — Other Ambulatory Visit: Payer: Self-pay

## 2019-01-28 ENCOUNTER — Ambulatory Visit (INDEPENDENT_AMBULATORY_CARE_PROVIDER_SITE_OTHER): Payer: Medicare Other | Admitting: Family Medicine

## 2019-01-28 ENCOUNTER — Encounter: Payer: Self-pay | Admitting: Family Medicine

## 2019-01-28 DIAGNOSIS — H6123 Impacted cerumen, bilateral: Secondary | ICD-10-CM | POA: Diagnosis present

## 2019-01-28 MED ORDER — DEBROX 6.5 % OT SOLN: 5.0000 [drp] | Freq: Two times a day (BID) | OTIC | 0 refills | Status: DC

## 2019-01-28 NOTE — Patient Instructions (Signed)
Thank you for coming to see me today. It was a pleasure!   Please follow-up as needed.  If you have any questions or concerns, please do not hesitate to call the office at [phone].  Take Care,   Martinique Hansen Carino, DO  Earwax Buildup, Adult The ears produce a substance called earwax that helps keep bacteria out of the ear and protects the skin in the ear canal. Occasionally, earwax can build up in the ear and cause discomfort or hearing loss. What increases the risk? This condition is more likely to develop in people who:  Are female.  Are elderly.  Naturally produce more earwax.  Clean their ears often with cotton swabs.  Use earplugs often.  Use in-ear headphones often.  Wear hearing aids.  Have narrow ear canals.  Have earwax that is overly thick or sticky.  Have eczema.  Are dehydrated.  Have excess hair in the ear canal. What are the signs or symptoms? Symptoms of this condition include:  Reduced or muffled hearing.  A feeling of fullness in the ear or feeling that the ear is plugged.  Fluid coming from the ear.  Ear pain.  Ear itch.  Ringing in the ear.  Coughing.  An obvious piece of earwax that can be seen inside the ear canal. How is this diagnosed? This condition may be diagnosed based on:  Your symptoms.  Your medical history.  An ear exam. During the exam, your health care provider will look into your ear with an instrument called an otoscope. You may have tests, including a hearing test. How is this treated? This condition may be treated by:  Using ear drops to soften the earwax.  Having the earwax removed by a health care provider. The health care provider may: ? Flush the ear with water. ? Use an instrument that has a loop on the end (curette). ? Use a suction device.  Surgery to remove the wax buildup. This may be done in severe cases. Follow these instructions at home:   Take over-the-counter and prescription medicines  only as told by your health care provider.  Do not put any objects, including cotton swabs, into your ear. You can clean the opening of your ear canal with a washcloth or facial tissue.  Follow instructions from your health care provider about cleaning your ears. Do not over-clean your ears.  Drink enough fluid to keep your urine clear or pale yellow. This will help to thin the earwax.  Keep all follow-up visits as told by your health care provider. If earwax builds up in your ears often or if you use hearing aids, consider seeing your health care provider for routine, preventive ear cleanings. Ask your health care provider how often you should schedule your cleanings.  If you have hearing aids, clean them according to instructions from the manufacturer and your health care provider. Contact a health care provider if:  You have ear pain.  You develop a fever.  You have blood, pus, or other fluid coming from your ear.  You have hearing loss.  You have ringing in your ears that does not go away.  Your symptoms do not improve with treatment.  You feel like the room is spinning (vertigo). Summary  Earwax can build up in the ear and cause discomfort or hearing loss.  The most common symptoms of this condition include reduced or muffled hearing and a feeling of fullness in the ear or feeling that the ear is plugged.  This condition may be diagnosed based on your symptoms, your medical history, and an ear exam.  This condition may be treated by using ear drops to soften the earwax or by having the earwax removed by a health care provider.  Do not put any objects, including cotton swabs, into your ear. You can clean the opening of your ear canal with a washcloth or facial tissue. This information is not intended to replace advice given to you by your health care provider. Make sure you discuss any questions you have with your health care provider. Document Released: 08/11/2004 Document  Revised: 06/16/2017 Document Reviewed: 09/14/2016 Elsevier Patient Education  2020 [employer].

## 2019-01-28 NOTE — Telephone Encounter (Signed)
Left message to call back and a schedule a follow up.

## 2019-01-28 NOTE — Telephone Encounter (Signed)
-----   Message from [place] sent at 01/01/2019  8:45 AM EDT ----- Regarding: appt From: Elias Else, CMA  Sent: 12/25/2018  To: Elias Else, CMA  Subject: follow up                     Needs a follow up first part of Aug 2020 see 11-23-2018 notes

## 2019-01-29 ENCOUNTER — Encounter: Payer: Self-pay | Admitting: Family Medicine

## 2019-01-29 DIAGNOSIS — H6123 Impacted cerumen, bilateral: Secondary | ICD-10-CM | POA: Insufficient documentation

## 2019-01-29 NOTE — Progress Notes (Signed)
  Subjective:  Patient ID: Marissa Blankenship  DOB: 02-04-1966 MRN: [id number]  Marissa Blankenship is a 53 y.o. female with a PMH of HTN, GERD, Crohn's disease, here today for trouble hearing.   HPI:  BL impacted cerumen: - Patient repots that her left ear has been hurting and feeling full for a while. She denies any jaw pain, ear popping, tinnitus, fevers, chills.She reports she has had ehr ear cleaned out in the office before. She also endorses her right ear feeling a little full.  Reports using   ROS:  mentioned in HPI Smoking status reviewed  Patient Active Problem List   Diagnosis Date Noted  . Bilateral hearing loss due to cerumen impaction 01/29/2019  . B12 deficiency 07/06/2018  . Anxiety and depression 04/16/2018  . Crohn's disease with complication ([place]) 45/62/5638  . Hyponatremia 02/02/2017  . Exacerbation of Crohn's disease with intestinal obstruction status post ileal stricturoplasty and ileocecal resection 01/31/2017 01/30/2017  . Anemia 01/30/2017  . Protein-calorie malnutrition, severe 12/02/2015  . PLANTAR FASCIITIS, LEFT 05/25/2010  . Essential hypertension 03/28/2007  . ACID REFLUX DISEASE 03/28/2007     Objective:  BP 114/70   Pulse 66   Ht 5' 4"  (1.626 m)   Wt 178 lb 4 oz (80.9 kg)   SpO2 97%   BMI 30.60 kg/m   Vitals and nursing note reviewed  General: NAD, pleasant HEENT: BL impacted cerumen, s/p mechanical removal and irrigation also with BL cerumen impaction, no redness in canal, unable to visualize TM's Extremities: no edema or cyanosis. WWP. Skin: warm and dry, no rashes noted Neuro: alert and oriented, no focal deficits Psych: normal affect, normal thought content  Assessment & Plan:   Bilateral hearing loss due to cerumen impaction Fullness minimally improved after irrigation and mechanical removal in office. Given that she continues to have impaction patient to trial debrox. She is to return if it does not improve within the next 1-2 weeks for repeat  cleaning or if she has continued pain. Discussed return precautions.    Martinique Chanze Teagle, DO Family Medicine Resident PGY-2

## 2019-01-29 NOTE — Assessment & Plan Note (Signed)
Fullness minimally improved after irrigation and mechanical removal in office. Given that she continues to have impaction patient to trial debrox. She is to return if it does not improve within the next 1-2 weeks for repeat cleaning or if she has continued pain. Discussed return precautions.

## 2019-03-05 ENCOUNTER — Encounter: Payer: Self-pay | Admitting: Gastroenterology

## 2019-03-05 ENCOUNTER — Other Ambulatory Visit: Payer: Self-pay

## 2019-03-05 ENCOUNTER — Ambulatory Visit (INDEPENDENT_AMBULATORY_CARE_PROVIDER_SITE_OTHER): Payer: Medicare Other | Admitting: Gastroenterology

## 2019-03-05 VITALS — BP 118/70 | HR 73 | Temp 98.9°F | Ht 64.0 in | Wt 179.0 lb

## 2019-03-05 DIAGNOSIS — Z79899 Other long term (current) drug therapy: Secondary | ICD-10-CM

## 2019-03-05 DIAGNOSIS — D500 Iron deficiency anemia secondary to blood loss (chronic): Secondary | ICD-10-CM

## 2019-03-05 DIAGNOSIS — E538 Deficiency of other specified B group vitamins: Secondary | ICD-10-CM

## 2019-03-05 DIAGNOSIS — R1013 Epigastric pain: Secondary | ICD-10-CM | POA: Diagnosis not present

## 2019-03-05 DIAGNOSIS — G8929 Other chronic pain: Secondary | ICD-10-CM

## 2019-03-05 DIAGNOSIS — K5000 Crohn's disease of small intestine without complications: Secondary | ICD-10-CM

## 2019-03-05 DIAGNOSIS — Z23 Encounter for immunization: Secondary | ICD-10-CM

## 2019-03-05 MED ORDER — NA SULFATE-K SULFATE-MG SULF 17.5-3.13-1.6 GM/177ML PO SOLN: 1.0000 | Freq: Once | ORAL | 0 refills | Status: AC

## 2019-03-05 NOTE — Patient Instructions (Addendum)
If you are age 51 or older, your body mass index should be between 23-30. Your Body mass index is 30.73 kg/m. If this is out of the aforementioned range listed, please consider follow up with your Primary Care Provider.  If you are age 37 or younger, your body mass index should be between 19-25. Your Body mass index is 30.73 kg/m. If this is out of the aformentioned range listed, please consider follow up with your Primary Care Provider.   You have been scheduled for an endoscopy/colonoscopy . Please follow written instructions given to you at your visit today. If you use inhalers (even only as needed), please bring them with you on the day of your procedure. Your physician has requested that you go to www.startemmi.com and enter the access code given to you at your visit today. This web site gives a general overview about your procedure. However, you should still follow specific instructions given to you by our office regarding your preparation for the procedure.  Today we gave you the PREVNAR13 injection   It was a pleasure to see you today!  Dr. Loletha Carrow

## 2019-03-05 NOTE — Progress Notes (Signed)
[facility] Progress Note  Chief Complaint: Ileal Crohn's disease  Subjective  History: Crohn's ileitis, resection summer 2018.  Started Stelara August 2019.  Last telemedicine May 8.  Anemia from both iron and B12 deficiency.  Chronic heartburn  Marquasha is feeling well overall.  She denies diarrhea or rectal bleeding.  She denies lower abdominal pain.  She continues to have periodic dyspepsia with a dull epigastric discomfort with intermittent regurgitation.  Denies dysphagia, odynophagia, does get nausea without vomiting.  Appetite good and weight stable overall. Tolerating Stelara injections well.  Following with primary care regularly, gets labs checked.  She has lately been taking "blood builder" capsule once daily and tolerating it well without GI upset.  ROS: Cardiovascular:  no chest pain Respiratory: no dyspnea Mood stable Remainder of systems negative except as above  The patient's Past Medical, Family and Social History were reviewed and are on file in the EMR.  Objective:  Med list reviewed  Current Outpatient Medications:  .  amLODipine (NORVASC) 10 MG tablet, TAKE 1 TABLET BY MOUTH EVERY DAY, Disp: 90 tablet, Rfl: 0 .  calcium-vitamin D (OSCAL WITH D) 500-200 MG-UNIT tablet, Take 1 tablet by mouth 2 (two) times daily., Disp: 60 tablet, Rfl: 6 .  escitalopram (LEXAPRO) 5 MG tablet, TAKE 1 TABLET BY MOUTH EVERY DAY, Disp: 30 tablet, Rfl: 2 .  omeprazole (PRILOSEC) 40 MG capsule, TAKE 1 CAPSULE BY MOUTH EVERY DAY, Disp: 30 capsule, Rfl: 2 .  ondansetron (ZOFRAN) 8 MG tablet, Take 1 tablet (8 mg total) by mouth every 8 (eight) hours as needed for nausea or vomiting., Disp: 45 tablet, Rfl: 3 .  ustekinumab (STELARA) 90 MG/ML SOSY injection, Inject 1 mL (90 mg total) into the skin every 8 (eight) weeks., Disp: 1 Syringe, Rfl: 6  Current Facility-Administered Medications:  .  cyanocobalamin ((VITAMIN B-12)) injection 1,000 mcg, 1,000 mcg, Intramuscular, Q30 days,  Riccio, Angela C, DO, 1,000 mcg at 11/21/18 1052   Vital signs in last 24 hrs: Vitals:   03/05/19 1408  BP: 118/70  Pulse: 73  Temp: 98.9 F (37.2 C)    Physical Exam  She is well-appearing  HEENT: sclera anicteric, oral mucosa moist without lesions  Neck: supple, no thyromegaly, JVD or lymphadenopathy  Cardiac: RRR without murmurs, S1S2 heard, no peripheral edema  Pulm: clear to auscultation bilaterally, normal RR and effort noted  Abdomen: soft, no tenderness, with active bowel sounds. No guarding or palpable hepatosplenomegaly.  Skin; warm and dry, no jaundice or rash  Recent Labs:  CBC Latest Ref Rng & Units 11/21/2018 07/06/2018 05/04/2018  WBC 4.0 - 10.5 K/uL 5.3 6.1 4.4  Hemoglobin 12.0 - 15.0 g/dL 13.0 12.5 13.1  Hematocrit 36.0 - 46.0 % 39.1 39.7 39.6  Platelets 150.0 - 400.0 K/uL 233.0 239 197.0   CMP Latest Ref Rng & Units 11/21/2018 09/19/2017 02/03/2017  Glucose 70 - 99 mg/dL 93 83 80  BUN 6 - 23 mg/dL 16 9 7   Creatinine 0.40 - 1.20 mg/dL 0.66 0.58 0.60  Sodium 135 - 145 mEq/L 140 142 134(L)  Potassium 3.5 - 5.1 mEq/L 3.7 4.3 3.9  Chloride 96 - 112 mEq/L 103 105 102  CO2 19 - 32 mEq/L 28 26 25   Calcium 8.4 - 10.5 mg/dL 8.9 9.2 8.3(L)  Total Protein 6.0 - 8.3 g/dL 7.2 7.1 5.9(L)  Total Bilirubin 0.2 - 1.2 mg/dL 0.4 0.2 0.7  Alkaline Phos 39 - 117 U/L 103 100 52  AST 0 - 37 U/L  14 20 14(L)  ALT 0 - 35 U/L 10 13 11(L)   Last QuantiFERON test April 2019  Last B12 760 on 01/15/19  May 6:  Iron 58, ferritin 25 @ASSESSMENTPLANBEGIN @ Assessment: Encounter Diagnoses  Name Primary?  . Crohn's disease of small intestine without complication ([place]) Yes  . Iron deficiency anemia due to chronic blood loss   . Abdominal pain, chronic, epigastric   . Long term current use of immunosuppressive drug   . B12 deficiency    Ileal Crohn's disease ultimately requiring ileocecal resection 2 years ago.  She has done very well since then, now on Stelara for the last year.   She does not seem to have recurrent Crohn symptoms, but we must assess for recurrent low-grade disease to know if it is worth continuing this medicine.  She has some degree of immunocompromise on this therapy, and was advised to get an annual flu shot.  She received the 23 valent pneumococcal vaccine in 2017, so we gave her the 13 Valent vaccine today.  She is now over 61 and should get the recombinant shingles vaccine with primary care.  The dyspepsia persists despite good control of Crohn's.  Must consider unlikely chance of upper GI Crohn's, possible H. pylori or gastritis, peptic ulcer, less likely malignancy.   Plan: EGD and colonoscopy.  She is agreeable after discussion of procedure and risks.  Total time 25 minutes, over half spent face-to-face with patient in counseling and coordination of care.   [name]

## 2019-03-14 ENCOUNTER — Other Ambulatory Visit: Payer: Self-pay

## 2019-03-14 ENCOUNTER — Ambulatory Visit (INDEPENDENT_AMBULATORY_CARE_PROVIDER_SITE_OTHER): Payer: Medicare Other

## 2019-03-14 DIAGNOSIS — E538 Deficiency of other specified B group vitamins: Secondary | ICD-10-CM

## 2019-03-14 MED ORDER — CYANOCOBALAMIN 1000 MCG/ML IJ SOLN
1000.0000 ug | Freq: Once | INTRAMUSCULAR | Status: AC
  Administered 2019-03-14: 14:00:00 1000 ug via INTRAMUSCULAR

## 2019-03-14 NOTE — Progress Notes (Signed)
Pt came in for B12 injection. Injection given in Rt arm as Pt had a Prevnar injection in her left arm recently. Pt tolerated well and will schedule appt to return in 1 month. Ottis Stain, CMA

## 2019-03-26 ENCOUNTER — Other Ambulatory Visit: Payer: Self-pay | Admitting: Gastroenterology

## 2019-04-03 ENCOUNTER — Telehealth: Payer: Self-pay | Admitting: *Deleted

## 2019-04-03 NOTE — Telephone Encounter (Signed)
Pt would like to get her shingles vaccine.  This would need to be sent to her pharmacy.  Will forward to PCP to approve, if appropriate.  Christen Bame, CMA

## 2019-04-05 ENCOUNTER — Other Ambulatory Visit: Payer: Self-pay | Admitting: Family Medicine

## 2019-04-05 NOTE — Telephone Encounter (Signed)
Ordered Shingrix vaccine to patient's pharmacy. Patient should get second Shingrix vaccine 2-6 months after the first and this second vaccine is ordered to her pharmacy as well.  Thank you!

## 2019-04-08 ENCOUNTER — Ambulatory Visit (INDEPENDENT_AMBULATORY_CARE_PROVIDER_SITE_OTHER): Payer: Medicare Other | Admitting: *Deleted

## 2019-04-08 ENCOUNTER — Other Ambulatory Visit: Payer: Self-pay

## 2019-04-08 DIAGNOSIS — Z23 Encounter for immunization: Secondary | ICD-10-CM | POA: Diagnosis present

## 2019-04-08 DIAGNOSIS — E538 Deficiency of other specified B group vitamins: Secondary | ICD-10-CM | POA: Diagnosis not present

## 2019-04-08 NOTE — Telephone Encounter (Signed)
Med was not sent to pharmacy, was chosen as a med to give here.  We do not carry this vaccine.  Back to MD  Thurmond Butts, I have ordered the test you just need to sign it :-)  Christen Bame, CMA

## 2019-04-08 NOTE — Addendum Note (Signed)
Addended by: Christen Bame D on: 04/08/2019 05:14 PM   Modules accepted: Level of Service

## 2019-04-08 NOTE — Progress Notes (Signed)
Pt is here for a B12 and a flu shot.  Tolerated both well.  Christen Bame, CMA  Pt is here for a b12 injection today.    Date of last office visit that b12 was discussed 01/15/19  Last injection was 03/14/19  Injection given in left deltoid, pt tolerated well and will stop at front desk to schedule next appt. Christen Bame, CMA

## 2019-04-08 NOTE — Addendum Note (Signed)
Addended by: Christen Bame D on: 04/08/2019 05:16 PM   Modules accepted: Orders

## 2019-04-09 ENCOUNTER — Other Ambulatory Visit: Payer: Self-pay

## 2019-04-12 MED ORDER — AMLODIPINE BESYLATE 10 MG PO TABS: 10.0000 mg | ORAL_TABLET | Freq: Every day | ORAL | 0 refills | Status: DC

## 2019-04-18 ENCOUNTER — Encounter: Payer: Medicare Other | Admitting: Gastroenterology

## 2019-04-23 MED ORDER — ZOSTER VACCINE LIVE 19400 UNT/0.65ML ~~LOC~~ SUSR: 0.6500 mL | Freq: Once | SUBCUTANEOUS | 0 refills | Status: AC

## 2019-04-23 NOTE — Addendum Note (Signed)
Addended by: Christen Bame D on: 04/23/2019 10:06 AM   Modules accepted: Orders

## 2019-05-01 ENCOUNTER — Telehealth: Payer: Self-pay | Admitting: Gastroenterology

## 2019-05-01 NOTE — Telephone Encounter (Signed)
Pt states that she has been trying to get a shingles injection with her PCP for over a month but has not been able to do so. She wants to know if Dr. Loletha Carrow could order it.

## 2019-05-02 ENCOUNTER — Other Ambulatory Visit: Payer: Self-pay | Admitting: Gastroenterology

## 2019-05-02 NOTE — Telephone Encounter (Signed)
Spoke to the patient who reported her PCP called her back and told her the injection had been ordered.

## 2019-05-03 NOTE — Telephone Encounter (Signed)
Incoming request for Stelara refills.Last TB gold 10-2017. Last seen 03-05-2019. Colonoscopy scheduled for 05-2019.

## 2019-05-22 ENCOUNTER — Telehealth: Payer: Self-pay | Admitting: Gastroenterology

## 2019-05-22 ENCOUNTER — Telehealth: Payer: Self-pay

## 2019-05-22 NOTE — Telephone Encounter (Signed)
Spoke with patient about prep instructions. Instructed patient to not eat solid foods, and to be on clear liquids for the rest of the day. Patient verbalized understanding.   Covid-19 screening questions   Do you now or have you had a fever in the last 14 days? No  Do you have any respiratory symptoms of shortness of breath or cough now or in the last 14 days? No  Do you have any family members or close contacts with diagnosed or suspected Covid-19 in the past 14 days? No  Have you been tested for Covid-19 and found to be positive? No

## 2019-05-22 NOTE — Telephone Encounter (Signed)
Called the patient, patient did not answer, left a very detailed message stating to not eat solid foods, to ingest at least 8 eight oz glasses of water/clear liquid to help the prep. Asked patient to call back if she had additional questions.

## 2019-05-22 NOTE — Telephone Encounter (Signed)
This patient was originally scheduled for her colonoscopy PRIOR to the implementation of the mandatory COVID-19 screenings (04/18/2019). ALL patients that were scheduled for procedures in the [facility] prior to the implementation of the mandatory COVID-19 screenings are not required to be screened per Barb Merino, RN. Thank you.

## 2019-05-22 NOTE — Telephone Encounter (Signed)
It will be fine if she remains on clear liquids remainder of the day and take prep as instructed.  I'll see her tomorrow.

## 2019-05-22 NOTE — Telephone Encounter (Signed)
Please do Covid screening when patient calls back. She was scheduled prior to mandatory covid testing.

## 2019-05-22 NOTE — Telephone Encounter (Signed)
Dr. Loletha Carrow, this patient called and informed me that she ate a liver pudding sandwich for breakfast. I told her to not eat any more food and to remain on clears until I was able to speak with you to see what you advise being that she ate solid food today. Do you want me to reschedule her procedure? Please advise?

## 2019-05-22 NOTE — Telephone Encounter (Signed)
Covid-19 screening questions   Do you now or have you had a fever in the last 14 days?  Do you have any respiratory symptoms of shortness of breath or cough now or in the last 14 days?  Do you have any family members or close contacts with diagnosed or suspected Covid-19 in the past 14 days?  Have you been tested for Covid-19 and found to be positive?       

## 2019-05-23 ENCOUNTER — Other Ambulatory Visit (INDEPENDENT_AMBULATORY_CARE_PROVIDER_SITE_OTHER): Payer: Medicare Other

## 2019-05-23 ENCOUNTER — Ambulatory Visit (AMBULATORY_SURGERY_CENTER): Payer: Medicare Other | Admitting: Gastroenterology

## 2019-05-23 ENCOUNTER — Other Ambulatory Visit: Payer: Self-pay | Admitting: *Deleted

## 2019-05-23 ENCOUNTER — Encounter: Payer: Self-pay | Admitting: Gastroenterology

## 2019-05-23 ENCOUNTER — Other Ambulatory Visit: Payer: Self-pay

## 2019-05-23 VITALS — BP 110/68 | HR 53 | Temp 99.0°F | Resp 14 | Ht 64.0 in | Wt 179.0 lb

## 2019-05-23 DIAGNOSIS — K50918 Crohn's disease, unspecified, with other complication: Secondary | ICD-10-CM

## 2019-05-23 DIAGNOSIS — G8929 Other chronic pain: Secondary | ICD-10-CM

## 2019-05-23 DIAGNOSIS — K50018 Crohn's disease of small intestine with other complication: Secondary | ICD-10-CM

## 2019-05-23 DIAGNOSIS — R110 Nausea: Secondary | ICD-10-CM

## 2019-05-23 DIAGNOSIS — R1013 Epigastric pain: Secondary | ICD-10-CM

## 2019-05-23 DIAGNOSIS — R12 Heartburn: Secondary | ICD-10-CM

## 2019-05-23 DIAGNOSIS — D500 Iron deficiency anemia secondary to blood loss (chronic): Secondary | ICD-10-CM

## 2019-05-23 LAB — CBC WITH DIFFERENTIAL/PLATELET
Basophils Absolute: 0 10*3/uL (ref 0.0–0.1)
Basophils Relative: 0.4 % (ref 0.0–3.0)
Eosinophils Absolute: 0.2 10*3/uL (ref 0.0–0.7)
Eosinophils Relative: 2.9 % (ref 0.0–5.0)
HCT: 39.4 % (ref 36.0–46.0)
Hemoglobin: 12.8 g/dL (ref 12.0–15.0)
Lymphocytes Relative: 25.3 % (ref 12.0–46.0)
Lymphs Abs: 1.5 10*3/uL (ref 0.7–4.0)
MCHC: 32.5 g/dL (ref 30.0–36.0)
MCV: 87.8 fl (ref 78.0–100.0)
Monocytes Absolute: 0.5 10*3/uL (ref 0.1–1.0)
Monocytes Relative: 8.5 % (ref 3.0–12.0)
Neutro Abs: 3.6 10*3/uL (ref 1.4–7.7)
Neutrophils Relative %: 62.9 % (ref 43.0–77.0)
Platelets: 233 10*3/uL (ref 150.0–400.0)
RBC: 4.49 Mil/uL (ref 3.87–5.11)
RDW: 14.8 % (ref 11.5–15.5)
WBC: 5.8 10*3/uL (ref 4.0–10.5)

## 2019-05-23 LAB — COMPREHENSIVE METABOLIC PANEL
ALT: 10 U/L (ref 0–35)
AST: 15 U/L (ref 0–37)
Albumin: 4.1 g/dL (ref 3.5–5.2)
Alkaline Phosphatase: 90 U/L (ref 39–117)
BUN: 8 mg/dL (ref 6–23)
CO2: 28 mEq/L (ref 19–32)
Calcium: 9.1 mg/dL (ref 8.4–10.5)
Chloride: 107 mEq/L (ref 96–112)
Creatinine, Ser: 0.66 mg/dL (ref 0.40–1.20)
GFR: 114.1 mL/min (ref >60.00)
Glucose, Bld: 75 mg/dL (ref 70–99)
Potassium: 4.3 mEq/L (ref 3.5–5.1)
Sodium: 142 mEq/L (ref 135–145)
Total Bilirubin: 0.4 mg/dL (ref 0.2–1.2)
Total Protein: 7.2 g/dL (ref 6.0–8.3)

## 2019-05-23 LAB — IBC + FERRITIN
Ferritin: 42.4 ng/mL (ref 10.0–291.0)
Iron: 97 ug/dL (ref 42–145)
Saturation Ratios: 26.5 % (ref 20.0–50.0)
Transferrin: 261 mg/dL (ref 212.0–360.0)

## 2019-05-23 MED ORDER — SODIUM CHLORIDE 0.9 % IV SOLN: 500.0000 mL | INTRAVENOUS | Status: DC

## 2019-05-23 NOTE — Patient Instructions (Signed)
Thank you for letting us take care of your healthcare needs today. Awaiting lab work results expect to hear from Dr. Loletha Carrow with those results.    YOU HAD AN ENDOSCOPIC PROCEDURE TODAY AT [facility]:   Refer to the procedure report that was given to you for any specific questions about what was found during the examination.  If the procedure report does not answer your questions, please call your gastroenterologist to clarify.  If you requested that your care partner not be given the details of your procedure findings, then the procedure report has been included in a sealed envelope for you to review at your convenience later.  YOU SHOULD EXPECT: Some feelings of bloating in the abdomen. Passage of more gas than usual.  Walking can help get rid of the air that was put into your GI tract during the procedure and reduce the bloating. If you had a lower endoscopy (such as a colonoscopy or flexible sigmoidoscopy) you may notice spotting of blood in your stool or on the toilet paper. If you underwent a bowel prep for your procedure, you may not have a normal bowel movement for a few days.  Please Note:  You might notice some irritation and congestion in your nose or some drainage.  This is from the oxygen used during your procedure.  There is no need for concern and it should clear up in a day or so.  SYMPTOMS TO REPORT IMMEDIATELY:   Following lower endoscopy (colonoscopy or flexible sigmoidoscopy):  Excessive amounts of blood in the stool  Significant tenderness or worsening of abdominal pains  Swelling of the abdomen that is new, acute  Fever of 100F or higher   Following upper endoscopy (EGD)  Vomiting of blood or coffee ground material  New chest pain or pain under the shoulder blades  Painful or persistently difficult swallowing  New shortness of breath  Fever of 100F or higher  Black, tarry-looking stools  For urgent or emergent issues, a gastroenterologist can be  reached at any hour by calling [phone].   DIET:  We do recommend a small meal at first, but then you may proceed to your regular diet.  Drink plenty of fluids but you should avoid alcoholic beverages for 24 hours.  ACTIVITY:  You should plan to take it easy for the rest of today and you should NOT DRIVE or use heavy machinery until tomorrow (because of the sedation medicines used during the test).    FOLLOW UP: Our staff will call the number listed on your records 48-72 hours following your procedure to check on you and address any questions or concerns that you may have regarding the information given to you following your procedure. If we do not reach you, we will leave a message.  We will attempt to reach you two times.  During this call, we will ask if you have developed any symptoms of COVID 19. If you develop any symptoms (ie: fever, flu-like symptoms, shortness of breath, cough etc.) before then, please call [phone].  If you test positive for Covid 19 in the 2 weeks post procedure, please call and report this information to [place].    If any biopsies were taken you will be contacted by phone or by letter within the next 1-3 weeks.  Please call us at [phone] if you have not heard about the biopsies in 3 weeks.    SIGNATURES/CONFIDENTIALITY: You and/or your care partner have signed paperwork which will be  entered into your electronic medical record.  These signatures attest to the fact that that the information above on your After Visit Summary has been reviewed and is understood.  Full responsibility of the confidentiality of this discharge information lies with you and/or your care-partner.

## 2019-05-23 NOTE — Progress Notes (Signed)
Called to room to assist during endoscopic procedure.  Patient ID and intended procedure confirmed with present staff. Received instructions for my participation in the procedure from the performing physician.  

## 2019-05-23 NOTE — Progress Notes (Signed)
Report given to PACU, vss 

## 2019-05-23 NOTE — Op Note (Signed)
[place] Patient Name: Marissa Blankenship Procedure Date: 05/23/2019 9:13 AM MRN: [id number] Endoscopist: Mallie Mussel L. Loletha Carrow , MD Age: 53 Referring MD:  Date of Birth: Dec 07, 1965 Gender: Female Account #: [id number] Procedure:                Upper GI endoscopy Indications:              Epigastric abdominal pain, Heartburn, Nausea Medicines:                Monitored Anesthesia Care Procedure:                Pre-Anesthesia Assessment:                           - Prior to the procedure, a History and Physical                            was performed, and patient medications and                            allergies were reviewed. The patient's tolerance of                            previous anesthesia was also reviewed. The risks                            and benefits of the procedure and the sedation                            options and risks were discussed with the patient.                            All questions were answered, and informed consent                            was obtained. Prior Anticoagulants: The patient has                            taken no previous anticoagulant or antiplatelet                            agents. ASA Grade Assessment: II - A patient with                            mild systemic disease. After reviewing the risks                            and benefits, the patient was deemed in                            satisfactory condition to undergo the procedure.                           After obtaining informed consent, the endoscope was  passed under direct vision. Throughout the                            procedure, the patient's blood pressure, pulse, and                            oxygen saturations were monitored continuously. The                            Endoscope was introduced through the mouth, and                            advanced to the second part of duodenum. The upper                            GI endoscopy  was accomplished without difficulty.                            The patient tolerated the procedure well. Scope In: Scope Out: Findings:                 The esophagus was normal.                           The entire examined stomach was normal. Biopsies                            were taken with a cold forceps for Helicobacter                            pylori testing using CLOtest.                           The cardia and gastric fundus were normal on                            retroflexion.                           The examined duodenum was normal. Complications:            No immediate complications. Estimated Blood Loss:     Estimated blood loss was minimal. Impression:               - Normal esophagus.                           - Normal stomach. Biopsied.                           - Normal examined duodenum. Recommendation:           - Patient has a contact number available for                            emergencies. The signs and symptoms of potential  delayed complications were discussed with the                            patient. Return to normal activities tomorrow.                            Written discharge instructions were provided to the                            patient.                           - Resume previous diet.                           - Continue present medications.                           - Await pathology results.                           - See the other procedure note for documentation of                            additional recommendations. [name] L. Loletha Carrow, MD 05/23/2019 9:37:35 AM This report has been signed electronically.

## 2019-05-23 NOTE — Op Note (Signed)
[place] Patient Name: Marissa Blankenship Procedure Date: 05/23/2019 9:12 AM MRN: [id number] Endoscopist: Mallie Mussel L. Loletha Carrow , MD Age: 53 Referring MD:  Date of Birth: [birth year] Gender: Female Account #: [id number] Procedure:                Colonoscopy Indications:              Disease activity assessment of Crohn's disease of                            the small bowel (reesction summer 2019, began                            Stelara monotherapy Aug 2019) Medicines:                Monitored Anesthesia Care Procedure:                Pre-Anesthesia Assessment:                           - Prior to the procedure, a History and Physical                            was performed, and patient medications and                            allergies were reviewed. The patient's tolerance of                            previous anesthesia was also reviewed. The risks                            and benefits of the procedure and the sedation                            options and risks were discussed with the patient.                            All questions were answered, and informed consent                            was obtained. Prior Anticoagulants: The patient has                            taken no previous anticoagulant or antiplatelet                            agents. ASA Grade Assessment: II - A patient with                            mild systemic disease. After reviewing the risks                            and benefits, the patient was deemed in  satisfactory condition to undergo the procedure.                           After obtaining informed consent, the colonoscope                            was passed under direct vision. Throughout the                            procedure, the patient's blood pressure, pulse, and                            oxygen saturations were monitored continuously. The                            Colonoscope was introduced through  the anus and                            advanced to the the ileocolonic anastomosis. The                            colonoscopy was performed without difficulty. The                            patient tolerated the procedure well. The quality                            of the bowel preparation was excellent.                            Neo-terminal ileum were photographed. Scope In: 9:23:40 AM Scope Out: 9:31:51 AM Scope Withdrawal Time: 0 hours 5 minutes 3 seconds  Total Procedure Duration: 0 hours 8 minutes 11 seconds  Findings:                 The perianal and digital rectal examinations were                            normal.                           The neo-terminal ileum and ileal surgical                            anastomosis contained multiple deep ulcers                            (primarily on the anastomosis).                           There was evidence of a prior side-to-side                            ileo-colonic anastomosis in the ascending colon.  This was patent and was characterized by ulceration                            and an intact staple line. The anastomosis was                            traversed.                           Retroflexion in the rectum was not performed due to                            anatomy.                           The exam was otherwise without abnormality. Complications:            No immediate complications. Estimated Blood Loss:     Estimated blood loss: none. Impression:               - Multiple ulcers in the neo-terminal ileum and at                            the ileal surgical anastomosis. Recurrent Crohn's                            disease after surgery.                           - Patent side-to-side ileo-colonic anastomosis,                            characterized by ulceration and an intact staple                            line.                           - The examination was otherwise normal.                            - No specimens collected. Recommendation:           - Patient has a contact number available for                            emergencies. The signs and symptoms of potential                            delayed complications were discussed with the                            patient. Return to normal activities tomorrow.                            Written discharge instructions were provided to the  patient.                           - Resume previous diet.                           - Continue present medications.                           - See the other procedure note for documentation of                            additional recommendations.                           - No recommendation at this time regarding repeat                            colonoscopy. Will depend on clinical condition.                           - Labs:                           Iron,TIBC,Ferritin, CBC/diff, CMP, TPMT enzyme                            activity (in anticipation of probable addition of                            azathioprine) Elvi Leventhal L. Loletha Carrow, MD 05/23/2019 9:45:08 AM This report has been signed electronically.

## 2019-05-23 NOTE — Progress Notes (Signed)
Temp JB  v/S [place]

## 2019-05-24 LAB — HELICOBACTER PYLORI SCREEN-BIOPSY: UREASE: NEGATIVE

## 2019-05-27 ENCOUNTER — Telehealth: Payer: Self-pay | Admitting: *Deleted

## 2019-05-27 NOTE — Telephone Encounter (Signed)
  Follow up Call-  Call back number 05/23/2019 08/14/2017  Post procedure Call Back phone  # [phone]  Permission to leave phone message Yes Yes  Some recent data might be hidden     Patient questions:  Do you have a fever, pain , or abdominal swelling? No. Pain Score  0 *  Have you tolerated food without any problems? Yes.    Have you been able to return to your normal activities? Yes.    Do you have any questions about your discharge instructions: Diet   No. Medications  No. Follow up visit  No.  Do you have questions or concerns about your Care? No.  Actions: * If pain score is 4 or above: No action needed, pain <4.

## 2019-05-31 ENCOUNTER — Ambulatory Visit (INDEPENDENT_AMBULATORY_CARE_PROVIDER_SITE_OTHER): Payer: Medicare Other

## 2019-05-31 ENCOUNTER — Other Ambulatory Visit: Payer: Self-pay

## 2019-05-31 DIAGNOSIS — E538 Deficiency of other specified B group vitamins: Secondary | ICD-10-CM | POA: Diagnosis not present

## 2019-05-31 MED ORDER — CYANOCOBALAMIN 1000 MCG/ML IJ SOLN
1000.0000 ug | Freq: Once | INTRAMUSCULAR | Status: AC
  Administered 2019-05-31: 1000 ug via INTRAMUSCULAR

## 2019-05-31 NOTE — Progress Notes (Signed)
Patient presents in nurse clinic for monthly B12 injection. Patient missed injection in October. Injection given RD, site unremarkable. Patient reminded to make her December apt on her way out.

## 2019-06-03 ENCOUNTER — Ambulatory Visit (INDEPENDENT_AMBULATORY_CARE_PROVIDER_SITE_OTHER): Payer: Medicare Other | Admitting: Family Medicine

## 2019-06-03 ENCOUNTER — Other Ambulatory Visit: Payer: Self-pay

## 2019-06-03 VITALS — BP 106/70 | HR 77 | Ht 64.0 in | Wt 179.1 lb

## 2019-06-03 DIAGNOSIS — H6121 Impacted cerumen, right ear: Secondary | ICD-10-CM | POA: Diagnosis not present

## 2019-06-03 MED ORDER — DEBROX 6.5 % OT SOLN: 10.0000 [drp] | Freq: Two times a day (BID) | OTIC | 0 refills | Status: DC

## 2019-06-03 NOTE — Assessment & Plan Note (Signed)
Significant cerumen impaction. Unable to irrigate at office today due to lack of supplies. Attempted to use curet, but patient did not tolerate this. We will increase Debrox to 10 drops BID in right ear for 4 days. Patient to follow up if no improvement.

## 2019-06-03 NOTE — Progress Notes (Signed)
    Subjective:  Marissa Blankenship is a 53 y.o. female who presents to the [facility] today with a chief complaint of right ear pain.   HPI:  Patient has a history of bilateral cerumen impaction.  She is here for follow-up for decreased hearing and sensation of fullness in her right ear.  She has been on Debrox in the past.  Says that she has been using it 5 drops both ears for the past week.  Her husband cleaned out her left ear the other night and she had significant improvement in symptoms on that side.  Patient says that she can get an occasional headache, but but does not need to take anything for it typically resolves on its own..  Denies any jaw pain, facial pain, fevers, chills, congestion, nasal drainage.  ROS: Per HPI   Objective:  Physical Exam: BP 106/70   Pulse 77   Ht 5' 4"  (1.626 m)   Wt 179 lb 2 oz (81.3 kg)   LMP 02/07/2017 (Approximate)   SpO2 100%   BMI 30.75 kg/m   Gen: NAD, resting comfortably HEENT: Right ear impacted with cerumen.  Left ear normal tympanic membrane.  No facial pain, no jaw popping pain, no oral lesions. MSK: no edema Skin: warm, dry Neuro: grossly normal, moves all extremities Psych: Normal affect and thought content  No results found for this or any previous visit (from the past 72 hour(s)).   Assessment/Plan:  Impacted cerumen of right ear Significant cerumen impaction. Unable to irrigate at office today due to lack of supplies. Attempted to use curet, but patient did not tolerate this. We will increase Debrox to 10 drops BID in right ear for 4 days. Patient to follow up if no improvement.    Lab Orders  No laboratory test(s) ordered today    Meds ordered this encounter  Medications  . carbamide peroxide (DEBROX) 6.5 % OTIC solution    Sig: Place 10 drops into the right ear 2 (two) times daily.    Dispense:  15 mL    Refill:  0      Marny Lowenstein, MD, MS FAMILY MEDICINE RESIDENT - PGY3 06/03/2019 8:08 PM

## 2019-06-03 NOTE — Patient Instructions (Signed)
It was a pleasure to see you today! Thank you for choosing [facility] for your primary care. Marissa Blankenship was seen for ear pain.   Your your pain is likely due to your impacted eardrum.  We can increase your Debrox drops to 10 drops on the right side twice a day.  Try this for 4 days see if you do not have improvement if no improvement in 2 weeks please follow-up with clinic.  Best,  Marny Lowenstein, MD, MS FAMILY MEDICINE RESIDENT - PGY3 06/03/2019 4:12 PM

## 2019-06-05 LAB — THIOPURINE METHYLTRANSFERASE (TPMT), RBC: Thiopurine Methyltransferase, RBC: 13 nmol/hr/mL RBC

## 2019-06-17 ENCOUNTER — Encounter: Payer: Self-pay | Admitting: *Deleted

## 2019-06-17 ENCOUNTER — Other Ambulatory Visit: Payer: Self-pay | Admitting: *Deleted

## 2019-06-17 DIAGNOSIS — K50918 Crohn's disease, unspecified, with other complication: Secondary | ICD-10-CM

## 2019-06-17 MED ORDER — AZATHIOPRINE 50 MG PO TABS: 50.0000 mg | ORAL_TABLET | Freq: Every day | ORAL | 2 refills | Status: DC

## 2019-07-01 ENCOUNTER — Other Ambulatory Visit: Payer: Self-pay

## 2019-07-01 ENCOUNTER — Ambulatory Visit (INDEPENDENT_AMBULATORY_CARE_PROVIDER_SITE_OTHER): Payer: Medicare Other

## 2019-07-01 DIAGNOSIS — E538 Deficiency of other specified B group vitamins: Secondary | ICD-10-CM

## 2019-07-01 NOTE — Progress Notes (Signed)
Patient presents in nurse clinic for monthly B12 injection. Injection given LD, site unremarkable. Patient to scheduled her January injection on her way out.

## 2019-07-04 ENCOUNTER — Other Ambulatory Visit: Payer: Self-pay | Admitting: Gastroenterology

## 2019-07-08 ENCOUNTER — Other Ambulatory Visit (INDEPENDENT_AMBULATORY_CARE_PROVIDER_SITE_OTHER): Payer: Medicare Other

## 2019-07-08 ENCOUNTER — Other Ambulatory Visit: Payer: Self-pay

## 2019-07-08 DIAGNOSIS — K50918 Crohn's disease, unspecified, with other complication: Secondary | ICD-10-CM

## 2019-07-08 LAB — CBC WITH DIFFERENTIAL/PLATELET
Basophils Absolute: 0 10*3/uL (ref 0.0–0.1)
Basophils Relative: 0.3 % (ref 0.0–3.0)
Eosinophils Absolute: 0.1 10*3/uL (ref 0.0–0.7)
Eosinophils Relative: 2.5 % (ref 0.0–5.0)
HCT: 40.1 % (ref 36.0–46.0)
Hemoglobin: 13.1 g/dL (ref 12.0–15.0)
Lymphocytes Relative: 23.8 % (ref 12.0–46.0)
Lymphs Abs: 1.2 10*3/uL (ref 0.7–4.0)
MCHC: 32.8 g/dL (ref 30.0–36.0)
MCV: 87.2 fl (ref 78.0–100.0)
Monocytes Absolute: 0.4 10*3/uL (ref 0.1–1.0)
Monocytes Relative: 8.6 % (ref 3.0–12.0)
Neutro Abs: 3.3 10*3/uL (ref 1.4–7.7)
Neutrophils Relative %: 64.8 % (ref 43.0–77.0)
Platelets: 222 10*3/uL (ref 150.0–400.0)
RBC: 4.59 Mil/uL (ref 3.87–5.11)
RDW: 14.2 % (ref 11.5–15.5)
WBC: 5.1 10*3/uL (ref 4.0–10.5)

## 2019-07-08 LAB — HEPATIC FUNCTION PANEL
ALT: 9 U/L (ref 0–35)
AST: 16 U/L (ref 0–37)
Albumin: 4.2 g/dL (ref 3.5–5.2)
Alkaline Phosphatase: 83 U/L (ref 39–117)
Bilirubin, Direct: 0.1 mg/dL (ref 0.0–0.3)
Total Bilirubin: 0.5 mg/dL (ref 0.2–1.2)
Total Protein: 7.3 g/dL (ref 6.0–8.3)

## 2019-07-08 MED ORDER — AZATHIOPRINE 50 MG PO TABS: 100.0000 mg | ORAL_TABLET | Freq: Every day | ORAL | 2 refills | Status: DC

## 2019-07-23 ENCOUNTER — Other Ambulatory Visit (INDEPENDENT_AMBULATORY_CARE_PROVIDER_SITE_OTHER): Payer: Medicare Other

## 2019-07-23 ENCOUNTER — Ambulatory Visit (INDEPENDENT_AMBULATORY_CARE_PROVIDER_SITE_OTHER): Payer: Medicare Other | Admitting: Gastroenterology

## 2019-07-23 ENCOUNTER — Encounter: Payer: Self-pay | Admitting: Gastroenterology

## 2019-07-23 VITALS — BP 140/94 | HR 60 | Temp 98.2°F | Ht 64.0 in | Wt 175.2 lb

## 2019-07-23 DIAGNOSIS — R110 Nausea: Secondary | ICD-10-CM | POA: Diagnosis not present

## 2019-07-23 DIAGNOSIS — Z79899 Other long term (current) drug therapy: Secondary | ICD-10-CM

## 2019-07-23 DIAGNOSIS — K50018 Crohn's disease of small intestine with other complication: Secondary | ICD-10-CM | POA: Diagnosis not present

## 2019-07-23 DIAGNOSIS — R1013 Epigastric pain: Secondary | ICD-10-CM | POA: Diagnosis not present

## 2019-07-23 DIAGNOSIS — D500 Iron deficiency anemia secondary to blood loss (chronic): Secondary | ICD-10-CM

## 2019-07-23 DIAGNOSIS — G8929 Other chronic pain: Secondary | ICD-10-CM

## 2019-07-23 LAB — CBC WITH DIFFERENTIAL/PLATELET
Basophils Absolute: 0 10*3/uL (ref 0.0–0.1)
Basophils Relative: 0.6 % (ref 0.0–3.0)
Eosinophils Absolute: 0.1 10*3/uL (ref 0.0–0.7)
Eosinophils Relative: 2.2 % (ref 0.0–5.0)
HCT: 38 % (ref 36.0–46.0)
Hemoglobin: 12.5 g/dL (ref 12.0–15.0)
Lymphocytes Relative: 32.4 % (ref 12.0–46.0)
Lymphs Abs: 1.9 10*3/uL (ref 0.7–4.0)
MCHC: 32.9 g/dL (ref 30.0–36.0)
MCV: 88.3 fl (ref 78.0–100.0)
Monocytes Absolute: 0.5 10*3/uL (ref 0.1–1.0)
Monocytes Relative: 8.6 % (ref 3.0–12.0)
Neutro Abs: 3.2 10*3/uL (ref 1.4–7.7)
Neutrophils Relative %: 56.2 % (ref 43.0–77.0)
Platelets: 235 10*3/uL (ref 150.0–400.0)
RBC: 4.3 Mil/uL (ref 3.87–5.11)
RDW: 14.3 % (ref 11.5–15.5)
WBC: 5.8 10*3/uL (ref 4.0–10.5)

## 2019-07-23 LAB — HEPATIC FUNCTION PANEL
ALT: 9 U/L (ref 0–35)
AST: 14 U/L (ref 0–37)
Albumin: 4.3 g/dL (ref 3.5–5.2)
Alkaline Phosphatase: 81 U/L (ref 39–117)
Bilirubin, Direct: 0.1 mg/dL (ref 0.0–0.3)
Total Bilirubin: 0.3 mg/dL (ref 0.2–1.2)
Total Protein: 7.3 g/dL (ref 6.0–8.3)

## 2019-07-23 MED ORDER — AZATHIOPRINE 50 MG PO TABS: 50.0000 mg | ORAL_TABLET | Freq: Two times a day (BID) | ORAL | 3 refills | Status: DC

## 2019-07-23 NOTE — Progress Notes (Addendum)
[facility] Progress Note  Chief Complaint: Ileocolonic Crohn's disease  Subjective  History:  Marissa Blankenship follows up for her Crohn's disease, having last been seen for her EGD and colonoscopy in early November. She had active ulceration at and just proximal to the ileocolonic anastomosis.  She has been on Stelara, since she was a presurgical Humira nonresponder.  Typically 2 BMs per day, has intermittent lower abdominal bloating or crampy pain. She was also having epigastric pain, EGD unremarkable, that and the nausea better on omeprazole and as needed Zofran. Azathioprine started at 50 mg a day after the colonoscopy, then increase to 100 mg a day after stable labs about 2 weeks ago.  After doing that dose increase, she had lower abdominal pain, so split it into 50 mg twice daily and is feeling well doing that. She denies fever vomiting weight loss or rectal bleeding. She has not had ocular or skin or musculoskeletal symptoms with her Crohn's.  ROS: Cardiovascular:  no chest pain Respiratory: no dyspnea Remainder of systems negative except as above  The patient's Past Medical, Family and Social History were reviewed and are on file in the EMR.  Objective:  Med list reviewed  Current Outpatient Medications:  .  amLODipine (NORVASC) 10 MG tablet, Take 1 tablet (10 mg total) by mouth daily., Disp: 90 tablet, Rfl: 0 .  azaTHIOprine (IMURAN) 50 MG tablet, Take 2 tablets (100 mg total) by mouth daily., Disp: 30 tablet, Rfl: 2 .  escitalopram (LEXAPRO) 5 MG tablet, TAKE 1 TABLET BY MOUTH EVERY DAY, Disp: 30 tablet, Rfl: 2 .  omeprazole (PRILOSEC) 40 MG capsule, TAKE 1 CAPSULE BY MOUTH EVERY DAY, Disp: 90 capsule, Rfl: 1 .  ondansetron (ZOFRAN) 8 MG tablet, Take 1 tablet (8 mg total) by mouth every 8 (eight) hours as needed for nausea or vomiting., Disp: 45 tablet, Rfl: 3 .  STELARA 90 MG/ML SOSY injection, INJECT 90 MG UNDER THE SKIN EVERY 8 WEEKS, Disp: 1 mL, Rfl: 5  Current  Facility-Administered Medications:  .  cyanocobalamin ((VITAMIN B-12)) injection 1,000 mcg, 1,000 mcg, Intramuscular, Q30 days, Riccio, Angela C, DO, 1,000 mcg at 07/01/19 1433   Vital signs in last 24 hrs: Vitals:   07/23/19 1553  BP: (!) 140/94  Pulse: 60  Temp: 98.2 F (36.8 C)    Physical Exam  Well-appearing  HEENT: sclera anicteric, oral mucosa moist without lesions  Neck: supple, no thyromegaly, JVD or lymphadenopathy  Cardiac: RRR without murmurs, S1S2 heard, no peripheral edema  Pulm: clear to auscultation bilaterally, normal RR and effort noted  Abdomen: soft, no tenderness, with active bowel sounds. No guarding or palpable hepatosplenomegaly.  Skin; warm and dry, no jaundice or rash  Recent Labs:  CMP Latest Ref Rng & Units 07/08/2019 05/23/2019 11/21/2018  Glucose 70 - 99 mg/dL - 75 93  BUN 6 - 23 mg/dL - 8 16  Creatinine 0.40 - 1.20 mg/dL - 0.66 0.66  Sodium 135 - 145 mEq/L - 142 140  Potassium 3.5 - 5.1 mEq/L - 4.3 3.7  Chloride 96 - 112 mEq/L - 107 103  CO2 19 - 32 mEq/L - 28 28  Calcium 8.4 - 10.5 mg/dL - 9.1 8.9  Total Protein 6.0 - 8.3 g/dL 7.3 7.2 7.2  Total Bilirubin 0.2 - 1.2 mg/dL 0.5 0.4 0.4  Alkaline Phos 39 - 117 U/L 83 90 103  AST 0 - 37 U/L 16 15 14   ALT 0 - 35 U/L 9 10 10  CBC Latest Ref Rng & Units 07/08/2019 05/23/2019 11/21/2018  WBC 4.0 - 10.5 K/uL 5.1 5.8 5.3  Hemoglobin 12.0 - 15.0 g/dL 13.1 12.8 13.0  Hematocrit 36.0 - 46.0 % 40.1 39.4 39.1  Platelets 150.0 - 400.0 K/uL 222.0 233.0 233.0     @ASSESSMENTPLANBEGIN @ Assessment: Encounter Diagnoses  Name Primary?  . Crohn's disease of small intestine with other complication ([place]) Yes  . Iron deficiency anemia secondary to blood loss (chronic)   . Abdominal pain, chronic, epigastric   . Nausea without vomiting   . Long term current use of immunosuppressive drug    Postsurgical recurrence of ileocolonic Crohn's disease at the anastomosis.  Insufficient control on Stelara, so  azathioprine was recently added. Tolerating increased dose of azathioprine by splitting dose twice daily.  She is up-to-date on influenza and pneumococcal vaccinations.   Plan: CBC with differential and hepatic function panel today.  If stable, continue current dose of azathioprine and Stelara.  She will need a repeat colonoscopy in several months to see if the degree of ulceration is stable or perhaps improved.  If not, may need to decrease Stelara frequency, currently at every 8 weeks.   Total time 30 minutes, over half spent face-to-face with patient in counseling and coordination of care.   [name]

## 2019-07-23 NOTE — Progress Notes (Signed)
Med change made

## 2019-07-23 NOTE — Patient Instructions (Signed)
If you are age 52 or older, your body mass index should be between 23-30. Your Body mass index is 30.08 kg/m. If this is out of the aforementioned range listed, please consider follow up with your Primary Care Provider.  If you are age 67 or younger, your body mass index should be between 19-25. Your Body mass index is 30.08 kg/m. If this is out of the aformentioned range listed, please consider follow up with your Primary Care Provider.   Your provider has requested that you go to the basement level for lab work before leaving today. Press "B" on the elevator. The lab is located at the first door on the left as you exit the elevator.  Due to recent changes in healthcare laws, you may see the results of your imaging and laboratory studies on MyChart before your provider has had a chance to review them.  We understand that in some cases there may be results that are confusing or concerning to you. Not all laboratory results come back in the same time frame and the provider may be waiting for multiple results in order to interpret others.  Please give [place] 48 hours in order for your provider to thoroughly review all the results before contacting the office for clarification of your results.   It was a pleasure to see you today!  Dr. Loletha Carrow

## 2019-07-24 ENCOUNTER — Telehealth: Payer: Self-pay | Admitting: Gastroenterology

## 2019-07-24 ENCOUNTER — Other Ambulatory Visit: Payer: Self-pay | Admitting: Family Medicine

## 2019-07-24 ENCOUNTER — Other Ambulatory Visit: Payer: Self-pay

## 2019-07-24 DIAGNOSIS — Z20822 Contact with and (suspected) exposure to covid-19: Secondary | ICD-10-CM

## 2019-07-24 NOTE — Telephone Encounter (Signed)
Thank you for letting us know.  That is why we take all necessary precautions in clinic.

## 2019-07-26 LAB — NOVEL CORONAVIRUS, NAA: SARS-CoV-2, NAA: NOT DETECTED

## 2019-08-01 ENCOUNTER — Telehealth: Payer: Self-pay | Admitting: Gastroenterology

## 2019-08-01 ENCOUNTER — Other Ambulatory Visit: Payer: Self-pay | Admitting: Family Medicine

## 2019-08-01 ENCOUNTER — Other Ambulatory Visit: Payer: Self-pay

## 2019-08-01 MED ORDER — AZATHIOPRINE 100 MG PO TABS: 100.0000 mg | ORAL_TABLET | Freq: Every day | ORAL | 3 refills | Status: DC

## 2019-08-01 NOTE — Telephone Encounter (Signed)
Pharmacy has only filled the Imuran 50 mg for 1 a day and not BID. They states they don't have a Rx for BID. Please verify correct dosing so I can fix with pharmacy.

## 2019-08-01 NOTE — Telephone Encounter (Signed)
It should be azathioprine 100 mg, 1 tablet daily.  Please send a new prescription for that for a month supply with 3 refills and cancel any prior prescriptions for the same medicine.  It is okay if she misses a day or 2 before she can get to the pharmacy and pick up a new dose.

## 2019-08-01 NOTE — Telephone Encounter (Signed)
Corrected Rx and cancelled the Imuram 50 mg prescription. Left a detailed voicemail for the patient on the change made.

## 2019-08-06 ENCOUNTER — Other Ambulatory Visit: Payer: Self-pay

## 2019-08-06 ENCOUNTER — Ambulatory Visit (INDEPENDENT_AMBULATORY_CARE_PROVIDER_SITE_OTHER): Payer: Medicare Other

## 2019-08-06 DIAGNOSIS — E538 Deficiency of other specified B group vitamins: Secondary | ICD-10-CM

## 2019-08-06 MED ORDER — CYANOCOBALAMIN 1000 MCG/ML IJ SOLN
1000.0000 ug | Freq: Once | INTRAMUSCULAR | Status: AC
  Administered 2019-08-06: 1000 ug via INTRAMUSCULAR

## 2019-08-06 NOTE — Progress Notes (Signed)
Patient presents in nurse clinic for monthly b12 injections. Injection given RD, site unremarkable.

## 2019-08-27 ENCOUNTER — Other Ambulatory Visit: Payer: Self-pay | Admitting: Gastroenterology

## 2019-08-30 ENCOUNTER — Encounter: Payer: Self-pay | Admitting: *Deleted

## 2019-08-30 ENCOUNTER — Other Ambulatory Visit: Payer: Self-pay | Admitting: *Deleted

## 2019-08-30 ENCOUNTER — Telehealth: Payer: Self-pay | Admitting: *Deleted

## 2019-08-30 DIAGNOSIS — K50018 Crohn's disease of small intestine with other complication: Secondary | ICD-10-CM

## 2019-08-30 DIAGNOSIS — D500 Iron deficiency anemia secondary to blood loss (chronic): Secondary | ICD-10-CM

## 2019-08-30 NOTE — Telephone Encounter (Signed)
Called the patient and reminded the patient to come in for her 6 week lab recheck the beginning of next week. Orders in Epic (CBC w/diff and HFP).   3 month f/u scheduled on 10/11/19 at 1:40 pm. Reminder letter mailed to patient.

## 2019-09-09 ENCOUNTER — Other Ambulatory Visit (INDEPENDENT_AMBULATORY_CARE_PROVIDER_SITE_OTHER): Payer: Medicare Other

## 2019-09-09 DIAGNOSIS — D500 Iron deficiency anemia secondary to blood loss (chronic): Secondary | ICD-10-CM | POA: Diagnosis not present

## 2019-09-09 DIAGNOSIS — K50018 Crohn's disease of small intestine with other complication: Secondary | ICD-10-CM | POA: Diagnosis not present

## 2019-09-09 LAB — CBC WITH DIFFERENTIAL/PLATELET
Basophils Absolute: 0 10*3/uL (ref 0.0–0.1)
Basophils Relative: 0.6 % (ref 0.0–3.0)
Eosinophils Absolute: 0.2 10*3/uL (ref 0.0–0.7)
Eosinophils Relative: 2.7 % (ref 0.0–5.0)
HCT: 39.5 % (ref 36.0–46.0)
Hemoglobin: 12.9 g/dL (ref 12.0–15.0)
Lymphocytes Relative: 28.6 % (ref 12.0–46.0)
Lymphs Abs: 1.7 10*3/uL (ref 0.7–4.0)
MCHC: 32.5 g/dL (ref 30.0–36.0)
MCV: 87.6 fl (ref 78.0–100.0)
Monocytes Absolute: 0.6 10*3/uL (ref 0.1–1.0)
Monocytes Relative: 9.6 % (ref 3.0–12.0)
Neutro Abs: 3.5 10*3/uL (ref 1.4–7.7)
Neutrophils Relative %: 58.5 % (ref 43.0–77.0)
Platelets: 246 10*3/uL (ref 150.0–400.0)
RBC: 4.51 Mil/uL (ref 3.87–5.11)
RDW: 15.3 % (ref 11.5–15.5)
WBC: 5.9 10*3/uL (ref 4.0–10.5)

## 2019-09-09 LAB — HEPATIC FUNCTION PANEL
ALT: 8 U/L (ref 0–35)
AST: 15 U/L (ref 0–37)
Albumin: 4.3 g/dL (ref 3.5–5.2)
Alkaline Phosphatase: 83 U/L (ref 39–117)
Bilirubin, Direct: 0.1 mg/dL (ref 0.0–0.3)
Total Bilirubin: 0.3 mg/dL (ref 0.2–1.2)
Total Protein: 7.4 g/dL (ref 6.0–8.3)

## 2019-09-11 ENCOUNTER — Other Ambulatory Visit: Payer: Self-pay | Admitting: *Deleted

## 2019-09-11 MED ORDER — STELARA 90 MG/ML ~~LOC~~ SOSY: 90.0000 mg | PREFILLED_SYRINGE | SUBCUTANEOUS | 4 refills | Status: DC

## 2019-09-23 ENCOUNTER — Other Ambulatory Visit: Payer: Self-pay

## 2019-09-23 ENCOUNTER — Ambulatory Visit (INDEPENDENT_AMBULATORY_CARE_PROVIDER_SITE_OTHER): Payer: Medicare Other

## 2019-09-23 DIAGNOSIS — E538 Deficiency of other specified B group vitamins: Secondary | ICD-10-CM

## 2019-09-23 MED ORDER — CYANOCOBALAMIN 1000 MCG/ML IJ SOLN
1000.0000 ug | Freq: Once | INTRAMUSCULAR | Status: AC
  Administered 2019-09-23: 1000 ug via INTRAMUSCULAR

## 2019-09-23 NOTE — Progress Notes (Signed)
Patient presents in nurse clinic for monthly B12 injection.   Injection given LD, site unremarkable.   Patient reminded she needs an office visit with PCP no later than May to continue injections in June.  Patient reminded to schedule apt for next injection on the way out.

## 2019-09-27 ENCOUNTER — Telehealth: Payer: Self-pay | Admitting: Gastroenterology

## 2019-09-27 NOTE — Telephone Encounter (Signed)
Called patient back and she states she has had a headache and been tired this week, no other symptoms, and wanted to know if she should get her COVID vaccine now. She has an office appt with Dr. Loletha Carrow on 10/11/19. I told her she may want to wait a week or so until she feels better, to get her COVID vaccine.

## 2019-09-27 NOTE — Telephone Encounter (Signed)
Patient called with concerns states she has been feeling bad this past week and is wondering if she should come in for her appt.

## 2019-10-11 ENCOUNTER — Ambulatory Visit (INDEPENDENT_AMBULATORY_CARE_PROVIDER_SITE_OTHER): Payer: Medicare Other | Admitting: Gastroenterology

## 2019-10-11 ENCOUNTER — Encounter: Payer: Self-pay | Admitting: Gastroenterology

## 2019-10-11 VITALS — BP 124/60 | HR 82 | Temp 98.1°F | Ht 64.0 in | Wt 174.0 lb

## 2019-10-11 DIAGNOSIS — Z79899 Other long term (current) drug therapy: Secondary | ICD-10-CM

## 2019-10-11 DIAGNOSIS — G8929 Other chronic pain: Secondary | ICD-10-CM | POA: Diagnosis not present

## 2019-10-11 DIAGNOSIS — K50018 Crohn's disease of small intestine with other complication: Secondary | ICD-10-CM | POA: Diagnosis not present

## 2019-10-11 DIAGNOSIS — R1013 Epigastric pain: Secondary | ICD-10-CM | POA: Diagnosis not present

## 2019-10-11 NOTE — Patient Instructions (Addendum)
If you are age 52 or older, your body mass index should be between 23-30. Your Body mass index is 29.87 kg/m. If this is out of the aforementioned range listed, please consider follow up with your Primary Care Provider.  If you are age 103 or younger, your body mass index should be between 19-25. Your Body mass index is 29.87 kg/m. If this is out of the aformentioned range listed, please consider follow up with your Primary Care Provider.   Please discontinue the azathioprine.   It was a pleasure to see you today!  Dr. Loletha Carrow

## 2019-10-11 NOTE — Progress Notes (Signed)
Marissa Blankenship Progress Note  Chief Complaint: Crohn's ileocolitis  Subjective  History: Last seen January 5 on combination therapy with azathioprine 100 mg daily and Stelara every 8 weeks.  She had difficulty with side effects from azathioprine, split it to 50 mg twice daily.  Marissa Blankenship's digestive symptoms have been fairly stable, with some ongoing intermittent epigastric pain.  Nothing found on upper endoscopy last November.  Bowel habits pretty regular, 1-2 times daily, no bleeding. She is having headaches, nausea and arthralgias that she is convinced are related to the azathioprine.  Those symptoms resolved when she was offered a few days between refills.  She has generalized fatigue but admits that may be from many factors.  Appetite good and weight stable. With recent lab work, I recommended she decrease Stelara interval to every 6 weeks, which she will do with the next injection.  Last injection was 2 or 3 weeks ago. Not having any difficulty with the Stelara injections or site reactions.  ROS: Cardiovascular:  no chest pain Respiratory: no dyspnea No rash See above Remainder systems negative except as above The patient's Past Medical, Family and Social History were reviewed and are on file in the EMR. Postmenopausal Objective:  Med list reviewed  Current Outpatient Medications:  .  amLODipine (NORVASC) 10 MG tablet, TAKE 1 TABLET BY MOUTH EVERY DAY, Disp: 90 tablet, Rfl: 0 .  azaTHIOprine (IMURAN) 50 MG tablet, TAKE 2 TABLETS EVERY DAY, Disp: 180 tablet, Rfl: 2 .  escitalopram (LEXAPRO) 5 MG tablet, TAKE 1 TABLET BY MOUTH EVERY DAY, Disp: 30 tablet, Rfl: 2 .  omeprazole (PRILOSEC) 40 MG capsule, TAKE 1 CAPSULE BY MOUTH EVERY DAY, Disp: 90 capsule, Rfl: 1 .  ondansetron (ZOFRAN) 8 MG tablet, Take 1 tablet (8 mg total) by mouth every 8 (eight) hours as needed for nausea or vomiting., Disp: 45 tablet, Rfl: 3 .  ustekinumab (STELARA) 90 MG/ML SOSY injection, Inject 1 mL (90  mg total) into the skin every 6 (six) weeks., Disp: 1 mL, Rfl: 4  Current Facility-Administered Medications:  .  cyanocobalamin ((VITAMIN B-12)) injection 1,000 mcg, 1,000 mcg, Intramuscular, Q30 days, Riccio, Angela C, DO, 1,000 mcg at 07/01/19 1433   Vital signs in last 24 hrs: Vitals:   10/11/19 1342  BP: 124/60  Pulse: 82  Temp: 98.1 F (36.7 Blankenship)    Physical Exam  Well-appearing  HEENT: sclera anicteric, oral mucosa moist without lesions  Neck: supple, no thyromegaly, JVD or lymphadenopathy  Cardiac: RRR without murmurs, S1S2 heard, no peripheral edema  Pulm: clear to auscultation bilaterally, normal RR and effort noted  Abdomen: soft, scattered tenderness, with active bowel sounds. No guarding or palpable hepatosplenomegaly.  Skin; warm and dry, no jaundice or rash  Labs:  CBC Latest Ref Rng & Units 09/09/2019 07/23/2019 07/08/2019  WBC 4.0 - 10.5 K/uL 5.9 5.8 5.1  Hemoglobin 12.0 - 15.0 g/dL 12.9 12.5 13.1  Hematocrit 36.0 - 46.0 % 39.5 38.0 40.1  Platelets 150.0 - 400.0 K/uL 246.0 235.0 222.0   CMP Latest Ref Rng & Units 09/09/2019 07/23/2019 07/08/2019  Glucose 70 - 99 mg/dL - - -  BUN 6 - 23 mg/dL - - -  Creatinine 0.40 - 1.20 mg/dL - - -  Sodium 135 - 145 mEq/L - - -  Potassium 3.5 - 5.1 mEq/L - - -  Chloride 96 - 112 mEq/L - - -  CO2 19 - 32 mEq/L - - -  Calcium 8.4 - 10.5 mg/dL - - -  Total Protein 6.0 - 8.3 g/dL 7.4 7.3 7.3  Total Bilirubin 0.2 - 1.2 mg/dL 0.3 0.3 0.5  Alkaline Phos 39 - 117 U/L 83 81 83  AST 0 - 37 U/L 15 14 16   ALT 0 - 35 U/L 8 9 9     ___________________________________________ Radiologic studies:   ____________________________________________ Other:   _____________________________________________ Assessment & Plan  Assessment: Encounter Diagnoses  Name Primary?  . Crohn's disease of small intestine with other complication ([place]) Yes  . Abdominal pain, chronic, epigastric   . Long term current use of immunosuppressive drug      Recurrent ileocolonic Crohn's in the neoterminal ileum and at the anastomosis with deep ulcer seen November 2020.  Azathioprine was started afterwards, but she is not tolerating it.  May need to start methotrexate.  Plan: Continue Stelara same dose, every 6 weeks Discontinue azathioprine I will consult with IBD colleagues and get back with her on next recommendation.  Hold off on colonoscopy at this point because we have had to stop the azathioprine.   30 minutes were spent on this encounter (including chart review, history/exam, counseling/coordination of care, and documentation)  [name]

## 2019-11-04 ENCOUNTER — Telehealth: Payer: Self-pay | Admitting: Gastroenterology

## 2019-11-04 NOTE — Telephone Encounter (Signed)
I called this patient of mine today hoping to discuss Crohn's therapy.   Got her voicemail and asked her to call when she can.  I would like to know when she is next due to take Stelara.  We were shortening the interval to 6 weeks, so I think she might be due soon. I would like to get a Stelara trough level done the day before her next planned injection.  When you hear from her, please let me know so we can plan that testing.

## 2019-11-08 ENCOUNTER — Other Ambulatory Visit: Payer: Self-pay

## 2019-11-08 DIAGNOSIS — K50018 Crohn's disease of small intestine with other complication: Secondary | ICD-10-CM

## 2019-11-08 NOTE — Telephone Encounter (Signed)
I have not heard back from this patient.  Please see 4/19 message and try to reach her to arrange a Stelara level the day prior to her next scheduled injection.  I think she may be due soon.  Let me know what you discover.  Thanks  - HD

## 2019-11-08 NOTE — Telephone Encounter (Signed)
Spoke with pt and she states she was going to take the [place] today but she is not feeling good so she was not going to take it, states it may make her feel worse. Do you want her to come for labs today if she will come?

## 2019-11-11 ENCOUNTER — Other Ambulatory Visit: Payer: Medicare Other

## 2019-11-11 DIAGNOSIS — K50018 Crohn's disease of small intestine with other complication: Secondary | ICD-10-CM

## 2019-11-11 NOTE — Telephone Encounter (Signed)
Pt has not taken the stelara and will come for lab, order in epic.

## 2019-11-11 NOTE — Telephone Encounter (Signed)
Thanks for the reply (I was out of the office Friday afternoon).  If she has not yet taken the Stelara, I would like her to come to the lab to draw a Stelara (ustekinumab) level.

## 2019-11-17 LAB — SERIAL MONITORING

## 2019-11-22 LAB — USTEKINUMAB AND ANTI-USTEK AB
Anti-Ustekinumab Antibody: <40 ng/mL
Ustekinumab: 4.5 ug/mL

## 2019-12-04 ENCOUNTER — Other Ambulatory Visit: Payer: Self-pay

## 2019-12-04 MED ORDER — STELARA 90 MG/ML ~~LOC~~ SOSY: PREFILLED_SYRINGE | SUBCUTANEOUS | 4 refills | Status: DC

## 2019-12-22 ENCOUNTER — Other Ambulatory Visit: Payer: Self-pay | Admitting: Family Medicine

## 2019-12-26 NOTE — Progress Notes (Signed)
° ° °  SUBJECTIVE:   CHIEF COMPLAINT / HPI:   Patient's most recent vitamin B-12 level was 760 in June 2020.  Patient receives B12 injections on a regular basis due to chronic malabsorption from history of Crohn's disease.  Patient states she has had an increase in fatigue over the past 3 months.  She states that she initially thought this was related to her azathioprine which she was receiving through GI for her history of Crohn's disease.  She states upon speaking with her gastroenterologist they stopped this medication a couple of months ago but her fatigue persist.  She states she feels that she does not sleep well at night, often waking up around 2 or 3 AM and moving around for a bit before going back to bed and then waking up around 5 AM due to needing to have a bowel movement because of her Crohn's disease.  The bulk of her fatigue symptoms occur in early afternoon.  She also states she does not eat lunch and often eats breakfast followed by dinner in the evening.   PERTINENT  PMH / PSH: History of Crohn's disease  OBJECTIVE:   BP 108/65    Pulse 82    Temp 98.4 F (36.9 C) (Oral)    Wt 173 lb (78.5 kg)    LMP 02/07/2017 (Approximate)    SpO2 98%    BMI 29.70 kg/m    General: NAD, pleasant, able to participate in exam, no thyromegaly Cardiac: RRR, no murmurs. Respiratory: CTAB, normal effort, No wheezes, rales or rhonchi Extremities: no edema or cyanosis. Skin: warm and dry, no rashes noted Neuro: alert, no obvious focal deficits, fine touch sensation intact Psych: Normal affect and mood  ASSESSMENT/PLAN:   B12 deficiency Patient with a history of B12 deficiency due to Crohn's disease.  Previously received B12 shots however she has missed a few of these and needed an office appointment in order to continue them. Plan: -We will check B12 level -Patient to continue B12 shots  Fatigue Assessment: Patient with a history of Crohn's disease, previously on B12 supplementation due to  this with about 3 months of fatigue symptoms.  Patient states that about 3 months ago she noticed her fatigue symptoms and talk to her GI specialist who removed her azathioprine which was thought to be the cause.  She states that unfortunately this did not help her symptoms continues.  Overall etiology is broad and can include B12 deficiency, thyroid, anemia, malnutrition, or poor sleep hygiene.  Patient did previously receive B12 shots and though she has missed a few of these this makes B12 deficiency less likely.  Will check TSH to look for signs of thyroid and CBC look for signs of anemia though she did have one recently which was normal.  Malnutrition is less likely.  Patient does endorse poor sleep and states she wakes up usually 2 times per night around 2 AM for a period of time and then again around 5 AM due to her Crohn's. Plan: -We will check TSH, B12 level, CBC -Discussed with patient sleep hygiene and provided her with a handout to assist with this -We will follow-up pending lab results   Patient states she gets her Pap smears through [facility], [place]

## 2020-01-02 ENCOUNTER — Encounter: Payer: Self-pay | Admitting: Family Medicine

## 2020-01-02 ENCOUNTER — Ambulatory Visit (INDEPENDENT_AMBULATORY_CARE_PROVIDER_SITE_OTHER): Payer: Medicare Other | Admitting: Family Medicine

## 2020-01-02 ENCOUNTER — Other Ambulatory Visit: Payer: Self-pay

## 2020-01-02 VITALS — BP 108/65 | HR 82 | Temp 98.4°F | Wt 173.0 lb

## 2020-01-02 DIAGNOSIS — K50919 Crohn's disease, unspecified, with unspecified complications: Secondary | ICD-10-CM

## 2020-01-02 DIAGNOSIS — R5383 Other fatigue: Secondary | ICD-10-CM | POA: Diagnosis not present

## 2020-01-02 DIAGNOSIS — E538 Deficiency of other specified B group vitamins: Secondary | ICD-10-CM | POA: Diagnosis present

## 2020-01-02 NOTE — Patient Instructions (Addendum)
It was great to see you!  Our plans for today:  -We are checking some labs today including a CBC, B12 level, TSH. -I would like for you to work on some of the sleep hygiene things we discussed such as minimizing screen time in the 45 minutes before bed, keeping the room cool and dark as these may provide some benefit.  We are checking some labs today, I will call you if they are abnormal will send you a MyChart message or a letter if they are normal.  If you do not hear about your labs in the next 2 weeks please let us know.  Take care and seek immediate care sooner if you develop any concerns.   Dr. [facility]    Insomnia Insomnia is a sleep disorder that makes it difficult to fall asleep or stay asleep. Insomnia can cause fatigue, low energy, difficulty concentrating, mood swings, and poor performance at work or school. There are three different ways to classify insomnia:  Difficulty falling asleep.  Difficulty staying asleep.  Waking up too early in the morning. Any type of insomnia can be long-term (chronic) or short-term (acute). Both are common. Short-term insomnia usually lasts for three months or less. Chronic insomnia occurs at least three times a week for longer than three months. What are the causes? Insomnia may be caused by another condition, situation, or substance, such as:  Anxiety.  Certain medicines.  Gastroesophageal reflux disease (GERD) or other gastrointestinal conditions.  Asthma or other breathing conditions.  Restless legs syndrome, sleep apnea, or other sleep disorders.  Chronic pain.  Menopause.  Stroke.  Abuse of alcohol, tobacco, or illegal drugs.  Mental health conditions, such as depression.  Caffeine.  Neurological disorders, such as Alzheimer's disease.  An overactive thyroid (hyperthyroidism). Sometimes, the cause of insomnia may not be known. What increases the risk? Risk factors for insomnia  include:  Gender. Women are affected more often than men.  Age. Insomnia is more common as you get older.  Stress.  Lack of exercise.  Irregular work schedule or working night shifts.  Traveling between different time zones.  Certain medical and mental health conditions. What are the signs or symptoms? If you have insomnia, the main symptom is having trouble falling asleep or having trouble staying asleep. This may lead to other symptoms, such as:  Feeling fatigued or having low energy.  Feeling nervous about going to sleep.  Not feeling rested in the morning.  Having trouble concentrating.  Feeling irritable, anxious, or depressed. How is this diagnosed? This condition may be diagnosed based on:  Your symptoms and medical history. Your health care provider may ask about: ? Your sleep habits. ? Any medical conditions you have. ? Your mental health.  A physical exam. How is this treated? Treatment for insomnia depends on the cause. Treatment may focus on treating an underlying condition that is causing insomnia. Treatment may also include:  Medicines to help you sleep.  Counseling or therapy.  Lifestyle adjustments to help you sleep better. Follow these instructions at home: Eating and drinking   Limit or avoid alcohol, caffeinated beverages, and cigarettes, especially close to bedtime. These can disrupt your sleep.  Do not eat a large meal or eat spicy foods right before bedtime. This can lead to digestive discomfort that can make it hard for you to sleep. Sleep habits   Keep a sleep diary to help you and your health care provider figure out what could be causing  your insomnia. Write down: ? When you sleep. ? When you wake up during the night. ? How well you sleep. ? How rested you feel the next day. ? Any side effects of medicines you are taking. ? What you eat and drink.  Make your bedroom a dark, comfortable place where it is easy to fall  asleep. ? Put up shades or blackout curtains to block light from outside. ? Use a white noise machine to block noise. ? Keep the temperature cool.  Limit screen use before bedtime. This includes: ? Watching TV. ? Using your smartphone, tablet, or computer.  Stick to a routine that includes going to bed and waking up at the same times every day and night. This can help you fall asleep faster. Consider making a quiet activity, such as reading, part of your nighttime routine.  Try to avoid taking naps during the day so that you sleep better at night.  Get out of bed if you are still awake after 15 minutes of trying to sleep. Keep the lights down, but try reading or doing a quiet activity. When you feel sleepy, go back to bed. General instructions  Take over-the-counter and prescription medicines only as told by your health care provider.  Exercise regularly, as told by your health care provider. Avoid exercise starting several hours before bedtime.  Use relaxation techniques to manage stress. Ask your health care provider to suggest some techniques that may work well for you. These may include: ? Breathing exercises. ? Routines to release muscle tension. ? Visualizing peaceful scenes.  Make sure that you drive carefully. Avoid driving if you feel very sleepy.  Keep all follow-up visits as told by your health care provider. This is important. Contact a health care provider if:  You are tired throughout the day.  You have trouble in your daily routine due to sleepiness.  You continue to have sleep problems, or your sleep problems get worse. Get help right away if:  You have serious thoughts about hurting yourself or someone else. If you ever feel like you may hurt yourself or others, or have thoughts about taking your own life, get help right away. You can go to your nearest emergency department or call:  Your local emergency services (911 in the [place]  A suicide crisis  helpline, such as the [facility] at 571-695-7740. This is open 24 hours a day. Summary  Insomnia is a sleep disorder that makes it difficult to fall asleep or stay asleep.  Insomnia can be long-term (chronic) or short-term (acute).  Treatment for insomnia depends on the cause. Treatment may focus on treating an underlying condition that is causing insomnia.  Keep a sleep diary to help you and your health care provider figure out what could be causing your insomnia. This information is not intended to replace advice given to you by your health care provider. Make sure you discuss any questions you have with your health care provider. Document Revised: 06/16/2017 Document Reviewed: 04/13/2017 Elsevier Patient Education  2020 [employer].

## 2020-01-02 NOTE — Assessment & Plan Note (Signed)
Patient with a history of B12 deficiency due to Crohn's disease.  Previously received B12 shots however she has missed a few of these and needed an office appointment in order to continue them. Plan: -We will check B12 level -Patient to continue B12 shots

## 2020-01-02 NOTE — Assessment & Plan Note (Signed)
Assessment: Patient with a history of Crohn's disease, previously on B12 supplementation due to this with about 3 months of fatigue symptoms.  Patient states that about 3 months ago she noticed her fatigue symptoms and talk to her GI specialist who removed her azathioprine which was thought to be the cause.  She states that unfortunately this did not help her symptoms continues.  Overall etiology is broad and can include B12 deficiency, thyroid, anemia, malnutrition, or poor sleep hygiene.  Patient did previously receive B12 shots and though she has missed a few of these this makes B12 deficiency less likely.  Will check TSH to look for signs of thyroid and CBC look for signs of anemia though she did have one recently which was normal.  Malnutrition is less likely.  Patient does endorse poor sleep and states she wakes up usually 2 times per night around 2 AM for a period of time and then again around 5 AM due to her Crohn's. Plan: -We will check TSH, B12 level, CBC -Discussed with patient sleep hygiene and provided her with a handout to assist with this -We will follow-up pending lab results

## 2020-01-03 LAB — CBC WITH DIFFERENTIAL/PLATELET
Basophils Absolute: 0 10*3/uL (ref 0.0–0.2)
Basos: 0 %
EOS (ABSOLUTE): 0.1 10*3/uL (ref 0.0–0.4)
Eos: 2 %
Hematocrit: 39.1 % (ref 34.0–46.6)
Hemoglobin: 13 g/dL (ref 11.1–15.9)
Immature Grans (Abs): 0 10*3/uL (ref 0.0–0.1)
Immature Granulocytes: 0 %
Lymphocytes Absolute: 1.6 10*3/uL (ref 0.7–3.1)
Lymphs: 27 %
MCH: 29.1 pg (ref 26.6–33.0)
MCHC: 33.2 g/dL (ref 31.5–35.7)
MCV: 88 fL (ref 79–97)
Monocytes Absolute: 0.5 10*3/uL (ref 0.1–0.9)
Monocytes: 8 %
Neutrophils Absolute: 3.7 10*3/uL (ref 1.4–7.0)
Neutrophils: 63 %
Platelets: 238 10*3/uL (ref 150–450)
RBC: 4.46 x10E6/uL (ref 3.77–5.28)
RDW: 14.1 % (ref 11.7–15.4)
WBC: 6 10*3/uL (ref 3.4–10.8)

## 2020-01-03 LAB — TSH: TSH: 0.627 u[IU]/mL (ref 0.450–4.500)

## 2020-01-03 LAB — VITAMIN B12: Vitamin B-12: 938 pg/mL (ref 232–1245)

## 2020-01-15 ENCOUNTER — Encounter: Payer: Self-pay | Admitting: Gastroenterology

## 2020-01-15 ENCOUNTER — Ambulatory Visit (INDEPENDENT_AMBULATORY_CARE_PROVIDER_SITE_OTHER): Payer: Medicare Other | Admitting: Gastroenterology

## 2020-01-15 VITALS — BP 122/72 | HR 71 | Ht 64.0 in | Wt 174.2 lb

## 2020-01-15 DIAGNOSIS — Z79899 Other long term (current) drug therapy: Secondary | ICD-10-CM

## 2020-01-15 DIAGNOSIS — R1013 Epigastric pain: Secondary | ICD-10-CM | POA: Diagnosis not present

## 2020-01-15 DIAGNOSIS — R12 Heartburn: Secondary | ICD-10-CM | POA: Diagnosis not present

## 2020-01-15 DIAGNOSIS — K50018 Crohn's disease of small intestine with other complication: Secondary | ICD-10-CM | POA: Diagnosis not present

## 2020-01-15 DIAGNOSIS — G8929 Other chronic pain: Secondary | ICD-10-CM

## 2020-01-15 DIAGNOSIS — R6881 Early satiety: Secondary | ICD-10-CM

## 2020-01-15 NOTE — Progress Notes (Signed)
[facility] Progress Note  Chief Complaint: Ileal Crohn's disease  Subjective  History: Marissa Blankenship has a history of ileal Crohn's disease, status post resection, failed preoperative Humira.  On postoperative Stelara every 8 weeks with azathioprine.  Active ulcerative disease last scope November 2020.  Azathioprine had to be stopped within the last few months due to severe arthralgias.  I then had her change Stelara to every 6 weeks, then every 4 weeks.  Marissa Blankenship has been feeling about the same since her last visit, though the arthralgias have not returned since stopping the azathioprine. She typically has 1 or 2 formed BMs per day. She is still bothered by some upper digestive symptoms including heartburn intermittent nausea and early satiety. Upper endoscopy was unrevealing last year. She denies rectal bleeding, weight stable.  ROS: Cardiovascular:  no chest pain Respiratory: no dyspnea Remainder of systems negative except as above  The patient's Past Medical, Family and Social History were reviewed and are on file in the EMR. Past Medical History:  Diagnosis Date   Acid reflux    Allergy    Anemia    Anxiety    Crohn's disease ([place])    Depression    Hypertension    IBS (irritable bowel syndrome)    spastic colon   Plantar fasciitis    PRE-ECLAMPSIA 06/18/2007   Qualifier: Diagnosis of  By: Amil Amen MD, Benard Rink of breath    Small bowel obstruction [facility])    Past Surgical History:  Procedure Laterality Date   CESAREAN SECTION     COLONOSCOPY     COLONOSCOPY WITH PROPOFOL N/A 12/02/2015   Procedure: COLONOSCOPY WITH PROPOFOL;  Surgeon: Doran Stabler, MD;  Location: [facility] ENDOSCOPY;  Service: Gastroenterology;  Laterality: N/A;   ESOPHAGOGASTRODUODENOSCOPY     LAPAROSCOPIC ILEOCECECTOMY N/A 01/31/2017   Procedure: LAPAROSCOPIC LYSIS OF ADHESIONS, ILEOCECECTOMY, STRICTUROPLASTY, BLADDER REPAIR, RIGHT SALPINGECTOMY;  Surgeon: Michael Boston, MD;   Location: [facility] ORS;  Service: General;  Laterality: N/A;   UPPER GASTROINTESTINAL ENDOSCOPY     WISDOM TOOTH EXTRACTION     Marissa Blankenship also told me she had had a tubal ligation  Objective:  Med list reviewed  Current Outpatient Medications:    amLODipine (NORVASC) 10 MG tablet, TAKE 1 TABLET BY MOUTH EVERY DAY, Disp: 90 tablet, Rfl: 0   escitalopram (LEXAPRO) 5 MG tablet, TAKE 1 TABLET BY MOUTH EVERY DAY, Disp: 30 tablet, Rfl: 2   omeprazole (PRILOSEC) 40 MG capsule, TAKE 1 CAPSULE BY MOUTH EVERY DAY, Disp: 90 capsule, Rfl: 1   ondansetron (ZOFRAN) 8 MG tablet, Take 1 tablet (8 mg total) by mouth every 8 (eight) hours as needed for nausea or vomiting., Disp: 45 tablet, Rfl: 3   ustekinumab (STELARA) 90 MG/ML SOSY injection, Inject into skin every 4 weeks, Disp: 1 mL, Rfl: 4  Current Facility-Administered Medications:    cyanocobalamin ((VITAMIN B-12)) injection 1,000 mcg, 1,000 mcg, Intramuscular, Q30 days, Riccio, Angela C, DO, 1,000 mcg at 07/01/19 1433   Vital signs in last 24 hrs: Vitals:   01/15/20 0935  BP: 122/72  Pulse: 71  SpO2: 98%    Physical Exam  Pleasant and well-appearing  HEENT: sclera anicteric, oral mucosa moist without lesions  Neck: supple, no thyromegaly, JVD or lymphadenopathy  Cardiac: RRR without murmurs, S1S2 heard, no peripheral edema  Pulm: clear to auscultation bilaterally, normal RR and effort noted  Abdomen: soft, no tenderness, with active bowel sounds. No guarding or palpable hepatosplenomegaly.  Skin;  warm and dry, no jaundice or rash  Labs:  CBC Latest Ref Rng & Units 01/02/2020 09/09/2019 07/23/2019  WBC 3.4 - 10.8 x10E3/uL 6.0 5.9 5.8  Hemoglobin 11.1 - 15.9 g/dL 13.0 12.9 12.5  Hematocrit 34.0 - 46.6 % 39.1 39.5 38.0  Platelets 150 - 450 x10E3/uL 238 246.0 235.0   CMP Latest Ref Rng & Units 09/09/2019 07/23/2019 07/08/2019  Glucose 70 - 99 mg/dL - - -  BUN 6 - 23 mg/dL - - -  Creatinine 0.40 - 1.20 mg/dL - - -  Sodium 135 - 145  mEq/L - - -  Potassium 3.5 - 5.1 mEq/L - - -  Chloride 96 - 112 mEq/L - - -  CO2 19 - 32 mEq/L - - -  Calcium 8.4 - 10.5 mg/dL - - -  Total Protein 6.0 - 8.3 g/dL 7.4 7.3 7.3  Total Bilirubin 0.2 - 1.2 mg/dL 0.3 0.3 0.5  Alkaline Phos 39 - 117 U/L 83 81 83  AST 0 - 37 U/L 15 14 16   ALT 0 - 35 U/L 8 9 9    HCV neg 2017 HBV Sag neg 2019 ___________________________________________ Radiologic studies:   ____________________________________________ Other:   _____________________________________________ Assessment & Plan  Assessment: Encounter Diagnoses  Name Primary?   Crohn's disease of small intestine with other complication ([place]) Yes   Abdominal pain, chronic, epigastric    Long term current use of immunosuppressive drug    Heartburn    Early satiety    Marissa Blankenship and I had a long discussion regarding her Crohn's disease and treatment options. Given the severity of her disease leading up to surgery, and the recurrence seen on last year's colonoscopy, I think combination immunosuppressive therapy is warranted for the best long-term chance of disease control. This is balanced against the increased immunosuppressive risk, raising the risk for infection and malignancy, but overall the data supports combination therapy for a patient like this.  I discussed methotrexate 25 mg IM weekly. I discussed the usual side effects of that and also the potential risk for liver and lung toxicity, less commonly renal toxicity. It would need to be taken with a milligram daily of folic acid.  Marissa Blankenship is considering it but reluctant to start that. She would prefer to wait until repeat colonoscopy toward the end of this calendar year to assess disease activity. If things are not improved, and certainly if worsened, with the increased frequency of Stelara, then she will most likely be agreeable to starting methotrexate.   Plan: I will see her back in several months or sooner if necessary.   30  minutes were spent on this encounter (including chart review, history/exam, counseling/coordination of care, and documentation)  [name]

## 2020-01-15 NOTE — Patient Instructions (Signed)
If you are age 52 or older, your body mass index should be between 23-30. Your Body mass index is 29.9 kg/m. If this is out of the aforementioned range listed, please consider follow up with your Primary Care Provider.  If you are age 50 or younger, your body mass index should be between 19-25. Your Body mass index is 29.9 kg/m. If this is out of the aformentioned range listed, please consider follow up with your Primary Care Provider.   Please keep follow up as scheduled in 3 months   It was a pleasure to see you today!  Dr. Loletha Carrow

## 2020-03-10 ENCOUNTER — Ambulatory Visit: Payer: Medicare Other | Attending: Internal Medicine

## 2020-03-10 DIAGNOSIS — Z23 Encounter for immunization: Secondary | ICD-10-CM

## 2020-03-10 NOTE — Progress Notes (Signed)
   Covid-19 Vaccination Clinic  Name:  Marissa Blankenship    MRN: [id number] DOB: 06-06-1966  03/10/2020  Marissa Blankenship was observed post Covid-19 immunization for 15 minutes without incident. She was provided with Vaccine Information Sheet and instruction to access the V-Safe system.   Marissa Blankenship was instructed to call 911 with any severe reactions post vaccine: Marland Kitchen Difficulty breathing  . Swelling of face and throat  . A fast heartbeat  . A bad rash all over body  . Dizziness and weakness

## 2020-03-15 ENCOUNTER — Other Ambulatory Visit: Payer: Self-pay | Admitting: Family Medicine

## 2020-03-30 ENCOUNTER — Other Ambulatory Visit: Payer: Self-pay | Admitting: Family Medicine

## 2020-03-30 ENCOUNTER — Other Ambulatory Visit: Payer: Self-pay | Admitting: Gastroenterology

## 2020-04-08 ENCOUNTER — Ambulatory Visit: Payer: Medicare Other | Admitting: Gastroenterology

## 2020-04-08 NOTE — Progress Notes (Deleted)
     [facility] Progress Note  Chief Complaint: ***  Subjective  History: Marissa Blankenship follows up for her ileal Crohn's disease, last visit on June 30.  Last colonoscopy November 2020 showing multiple deep ulcers at and just proximal to the ileocolonic anastomosis.  She failed preoperative Humira and was intolerant of azathioprine due to arthralgias.  At the last visit I recommended adding methotrexate 25 mg IM weekly and discussed risks and benefits of that.  She was apprehensive about that, and preferred to wait until repeat colonoscopy later this year to reassess disease activity and then make her decision. She has had chronic dyspepsia as well, unrevealing upper endoscopy last year. ***  ROS: Cardiovascular:  no chest pain Respiratory: no dyspnea  The patient's Past Medical, Family and Social History were reviewed and are on file in the EMR.  Objective:  Med list reviewed  Current Outpatient Medications:  .  amLODipine (NORVASC) 10 MG tablet, TAKE 1 TABLET BY MOUTH EVERY DAY, Disp: 90 tablet, Rfl: 0 .  escitalopram (LEXAPRO) 5 MG tablet, TAKE 1 TABLET BY MOUTH EVERY DAY, Disp: 30 tablet, Rfl: 2 .  omeprazole (PRILOSEC) 40 MG capsule, TAKE 1 CAPSULE BY MOUTH EVERY DAY, Disp: 90 capsule, Rfl: 1 .  ondansetron (ZOFRAN) 8 MG tablet, Take 1 tablet (8 mg total) by mouth every 8 (eight) hours as needed for nausea or vomiting., Disp: 45 tablet, Rfl: 3 .  ustekinumab (STELARA) 90 MG/ML SOSY injection, Inject into skin every 4 weeks, Disp: 1 mL, Rfl: 4  Current Facility-Administered Medications:  .  cyanocobalamin ((VITAMIN B-12)) injection 1,000 mcg, 1,000 mcg, Intramuscular, Q30 days, Riccio, Angela C, DO, 1,000 mcg at 07/01/19 1433   Vital signs in last 24 hrs: There were no vitals filed for this visit.  Physical Exam  ***  HEENT: sclera anicteric, oral mucosa moist without lesions  Neck: supple, no thyromegaly, JVD or lymphadenopathy  Cardiac: RRR without murmurs, S1S2  heard, no peripheral edema  Pulm: clear to auscultation bilaterally, normal RR and effort noted  Abdomen: soft, *** tenderness, with active bowel sounds. No guarding or palpable hepatosplenomegaly.  Skin; warm and dry, no jaundice or rash  Labs:   ___________________________________________ Radiologic studies:   ____________________________________________ Other:   _____________________________________________ Assessment & Plan  Assessment: No diagnosis found.    Plan:    *** minutes were spent on this encounter (including chart review, history/exam, counseling/coordination of care, and documentation)  [name]

## 2020-04-15 ENCOUNTER — Ambulatory Visit: Payer: Medicare Other | Admitting: Gastroenterology

## 2020-06-16 ENCOUNTER — Encounter: Payer: Self-pay | Admitting: Gastroenterology

## 2020-06-16 ENCOUNTER — Ambulatory Visit (INDEPENDENT_AMBULATORY_CARE_PROVIDER_SITE_OTHER): Payer: Medicare Other | Admitting: Gastroenterology

## 2020-06-16 ENCOUNTER — Other Ambulatory Visit (INDEPENDENT_AMBULATORY_CARE_PROVIDER_SITE_OTHER): Payer: Medicare Other

## 2020-06-16 VITALS — BP 126/78 | HR 56 | Ht 64.0 in | Wt 172.2 lb

## 2020-06-16 DIAGNOSIS — Z79899 Other long term (current) drug therapy: Secondary | ICD-10-CM

## 2020-06-16 DIAGNOSIS — K5000 Crohn's disease of small intestine without complications: Secondary | ICD-10-CM | POA: Diagnosis not present

## 2020-06-16 DIAGNOSIS — R110 Nausea: Secondary | ICD-10-CM

## 2020-06-16 LAB — CBC WITH DIFFERENTIAL/PLATELET
Basophils Absolute: 0 10*3/uL (ref 0.0–0.1)
Basophils Relative: 0.6 % (ref 0.0–3.0)
Eosinophils Absolute: 0.1 10*3/uL (ref 0.0–0.7)
Eosinophils Relative: 2.5 % (ref 0.0–5.0)
HCT: 38.6 % (ref 36.0–46.0)
Hemoglobin: 12.5 g/dL (ref 12.0–15.0)
Lymphocytes Relative: 28.7 % (ref 12.0–46.0)
Lymphs Abs: 1.6 10*3/uL (ref 0.7–4.0)
MCHC: 32.5 g/dL (ref 30.0–36.0)
MCV: 87.4 fl (ref 78.0–100.0)
Monocytes Absolute: 0.5 10*3/uL (ref 0.1–1.0)
Monocytes Relative: 8.7 % (ref 3.0–12.0)
Neutro Abs: 3.4 10*3/uL (ref 1.4–7.7)
Neutrophils Relative %: 59.5 % (ref 43.0–77.0)
Platelets: 226 10*3/uL (ref 150.0–400.0)
RBC: 4.42 Mil/uL (ref 3.87–5.11)
RDW: 14.7 % (ref 11.5–15.5)
WBC: 5.6 10*3/uL (ref 4.0–10.5)

## 2020-06-16 LAB — COMPREHENSIVE METABOLIC PANEL
ALT: 9 U/L (ref 0–35)
AST: 14 U/L (ref 0–37)
Albumin: 4.2 g/dL (ref 3.5–5.2)
Alkaline Phosphatase: 78 U/L (ref 39–117)
BUN: 13 mg/dL (ref 6–23)
CO2: 35 mEq/L — ABNORMAL HIGH (ref 19–32)
Calcium: 9.2 mg/dL (ref 8.4–10.5)
Chloride: 104 mEq/L (ref 96–112)
Creatinine, Ser: 0.73 mg/dL (ref 0.40–1.20)
GFR: 94.57 mL/min (ref >60.00)
Glucose, Bld: 80 mg/dL (ref 70–99)
Potassium: 3.8 mEq/L (ref 3.5–5.1)
Sodium: 143 mEq/L (ref 135–145)
Total Bilirubin: 0.3 mg/dL (ref 0.2–1.2)
Total Protein: 7.2 g/dL (ref 6.0–8.3)

## 2020-06-16 LAB — C-REACTIVE PROTEIN: CRP: <1 mg/dL (ref 0.5–20.0)

## 2020-06-16 MED ORDER — PLENVU 140 G PO SOLR: 1.0000 | ORAL | 0 refills | Status: DC

## 2020-06-16 NOTE — Patient Instructions (Addendum)
If you are age 52 or older, your body mass index should be between 23-30. Your Body mass index is 29.56 kg/m. If this is out of the aforementioned range listed, please consider follow up with your Primary Care Provider.  If you are age 51 or younger, your body mass index should be between 19-25. Your Body mass index is 29.56 kg/m. If this is out of the aformentioned range listed, please consider follow up with your Primary Care Provider.   Your provider has requested that you go to the basement level for lab work before leaving today. Press "B" on the elevator. The lab is located at the first door on the left as you exit the elevator.  You have been scheduled for a colonoscopy. Please follow written instructions given to you at your visit today.  Please pick up your prep supplies at the pharmacy within the next 1-3 days. If you use inhalers (even only as needed), please bring them with you on the day of your procedure.  Follow up pending the results of your labs and Colonoscopy.  It was a pleasure to see you today!  Dr. Loletha Carrow

## 2020-06-16 NOTE — Progress Notes (Signed)
[facility] Progress Note  Chief Complaint: Ileal Crohn's disease  Subjective  History: Last seen 01/15/2020 for ileal Crohn's disease recurrent after prior resection. On Stelara every 4 weeks. Intolerant of azathioprine due to arthralgias. At most recent visit I recommend starting methotrexate 25 mg weekly, but she was reluctant to do that out of concern for the risks, and wanted to wait until repeat colonoscopy to assess disease activity. She missed an appointment with [place] in September.  Malasia has generally been feeling well from a digestive standpoint.  She is taking her Stelara as scheduled every 4 weeks, in the company sends 1 injection pen to her on time, and she is scheduled to take it the day after tomorrow.  She denies injection site reactions arthralgias rash or painful eye redness.  She has nausea and abdominal cramps a few days prior to injections.  ROS: Cardiovascular:  no chest pain Respiratory: no dyspnea  The patient's Past Medical, Family and Social History were reviewed and are on file in the EMR.  Objective:  Med list reviewed  Current Outpatient Medications:  .  amLODipine (NORVASC) 10 MG tablet, TAKE 1 TABLET BY MOUTH EVERY DAY, Disp: 90 tablet, Rfl: 0 .  escitalopram (LEXAPRO) 5 MG tablet, TAKE 1 TABLET BY MOUTH EVERY DAY, Disp: 30 tablet, Rfl: 2 .  omeprazole (PRILOSEC) 40 MG capsule, TAKE 1 CAPSULE BY MOUTH EVERY DAY, Disp: 90 capsule, Rfl: 1 .  ondansetron (ZOFRAN) 8 MG tablet, Take 1 tablet (8 mg total) by mouth every 8 (eight) hours as needed for nausea or vomiting., Disp: 45 tablet, Rfl: 3 .  ustekinumab (STELARA) 90 MG/ML SOSY injection, Inject into skin every 4 weeks, Disp: 1 mL, Rfl: 4   Vital signs in last 24 hrs: Vitals:   06/16/20 1527  BP: 126/78  Pulse: (!) 56  SpO2: 99%    Physical Exam  She is well-appearing, overweight  HEENT: sclera anicteric, oral mucosa moist without lesions  Neck: supple, no thyromegaly, JVD or  lymphadenopathy  Cardiac: RRR without murmurs, S1S2 heard, no peripheral edema  Pulm: clear to auscultation bilaterally, normal RR and effort noted  Abdomen: soft, no tenderness, with active bowel sounds. No guarding or palpable hepatosplenomegaly.  Skin; warm and dry, no jaundice or rash  Labs:  CBC Latest Ref Rng & Units 01/02/2020 09/09/2019 07/23/2019  WBC 3.4 - 10.8 x10E3/uL 6.0 5.9 5.8  Hemoglobin 11.1 - 15.9 g/dL 13.0 12.9 12.5  Hematocrit 34.0 - 46.6 % 39.1 39.5 38.0  Platelets 150 - 450 x10E3/uL 238 246.0 235.0   CMP Latest Ref Rng & Units 09/09/2019 07/23/2019 07/08/2019  Glucose 70 - 99 mg/dL - - -  BUN 6 - 23 mg/dL - - -  Creatinine 0.40 - 1.20 mg/dL - - -  Sodium 135 - 145 mEq/L - - -  Potassium 3.5 - 5.1 mEq/L - - -  Chloride 96 - 112 mEq/L - - -  CO2 19 - 32 mEq/L - - -  Calcium 8.4 - 10.5 mg/dL - - -  Total Protein 6.0 - 8.3 g/dL 7.4 7.3 7.3  Total Bilirubin 0.2 - 1.2 mg/dL 0.3 0.3 0.5  Alkaline Phos 39 - 117 U/L 83 81 83  AST 0 - 37 U/L 15 14 16   ALT 0 - 35 U/L 8 9 9     ___________________________________________ Radiologic studies:   ____________________________________________ Other:   _____________________________________________ Assessment & Plan  Assessment: Encounter Diagnoses  Name Primary?  . Crohn's disease of small intestine  without complication ([place]) Yes  . Long term current use of immunosuppressive drug   . Nausea in adult    Ileal Crohn's disease under good symptomatic control. Needs disease activity assessment.  Plan: CBC, CMP, CRP, Stelara drug and antibody levels today  Colonoscopy to evaluate disease activity.  She was agreeable and wanted to do it in January.  The benefits and risks of the planned procedure were described in detail with the patient or (when appropriate) their health care proxy.  Risks were outlined as including, but not limited to, bleeding, infection, perforation, adverse medication reaction leading to cardiac or  pulmonary decompensation, pancreatitis (if ERCP).  The limitation of incomplete mucosal visualization was also discussed.  No guarantees or warranties were given.   30 minutes were spent on this encounter (including chart review, history/exam, counseling/coordination of care, and documentation)  Marissa Blankenship

## 2020-06-22 LAB — USTEKINUMAB AND ANTI-USTEK AB
Anti-Ustekinumab Antibody: <40 ng/mL
Ustekinumab: 12 ug/mL

## 2020-06-22 LAB — SERIAL MONITORING

## 2020-07-23 ENCOUNTER — Telehealth: Payer: Self-pay | Admitting: Gastroenterology

## 2020-07-23 NOTE — Telephone Encounter (Signed)
Spoke with patient, she states that she found out earlier this week that she was exposed to [place], she states that she is fully vaccinated and has had booster. Pt denies any COVID symptoms. Patient is worried because she is on Stelara and is immunocompromised, she has received her Stelara injection and was due yesterday but has not taken it because she wanted to make sure it was okay to take due to [place] exposure. Please advise, thank you.

## 2020-07-23 NOTE — Telephone Encounter (Signed)
Patient called said she was suppose to have her Stelara yesterday but due to possible covid exposure she did no show. She wants to know if she should still get the medication or get tested first.

## 2020-07-24 ENCOUNTER — Encounter: Payer: Medicare Other | Admitting: Gastroenterology

## 2020-07-24 NOTE — Telephone Encounter (Signed)
I am glad she called for advice.  The fact that she is vaccinated and boosted is reassuring, and I assume she does not have COVID symptoms.  If she develops COVID symptoms, get tested immediately.  If she remains asymptomatic, then CDC guidelines suggest getting tested about day 5 from exposure if she can make those arrangements.  I would like to hear from her next Monday, 07/27/20, with how she is feeling and if she was able to get tested.  If she gets tested and is negative, take Stelara dose. If she is unable to get tested and remains asymptomatic 10 days out from COVID exposure, take Stelara dose.  - HD

## 2020-07-24 NOTE — Telephone Encounter (Signed)
Spoke with patient in regards to Dr. Loletha Carrow' recommendations as outlined below. Patient is aware that she should call Monday with an update. Patient verbalized understanding and had no concerns at the end of the call.

## 2020-07-27 NOTE — Telephone Encounter (Signed)
Spoke with patient, she states that she is still asymptomatic, she states that she was exposed on 07/15/20. She has not been tested as there are no available appointments until next week. Advised patient that since it has been 10 days since her exposure and she is asymptomatic she can proceed with her Stelara injection. She is aware that this will be sent to Dr. Loletha Carrow as requested.

## 2020-07-27 NOTE — Telephone Encounter (Signed)
Pt is wanting to inform the nurse that she has not experienced any COVID symptoms and would like to schedule her injection.

## 2020-07-28 NOTE — Telephone Encounter (Signed)
Yes, that is the plan.  Appreciate the update.  - HD

## 2020-07-29 NOTE — Telephone Encounter (Signed)
Spoke with patient to clarify, she states that she gives herself the Stelara subcutaneous injections at home.

## 2020-08-16 ENCOUNTER — Other Ambulatory Visit: Payer: Self-pay | Admitting: Family Medicine

## 2020-08-25 ENCOUNTER — Telehealth: Payer: Self-pay | Admitting: Gastroenterology

## 2020-08-25 NOTE — Telephone Encounter (Signed)
Fyi, pt called to inform that she changed insurance company to [facility]. She just received a letter from [facility] stating that Delsa Grana will not be covered and that provider could follow certain steps to try to get it covered. Pt wanted to let us know in advance for her next rf.

## 2020-08-26 NOTE — Telephone Encounter (Signed)
Lm on vm for patient to return call 

## 2020-08-27 NOTE — Telephone Encounter (Signed)
Spoke with patient, advised that we will need a copy of her updated insurance cards so that we can try to get her Stelara authorized through her new insurance. Patient states that she will upload updated into My Chart once she returns home. Pt verbalized understanding and had no concerns at the end of the call.

## 2020-08-31 ENCOUNTER — Other Ambulatory Visit: Payer: Self-pay

## 2020-08-31 MED ORDER — STELARA 90 MG/ML ~~LOC~~ SOSY
PREFILLED_SYRINGE | SUBCUTANEOUS | 4 refills | Status: DC
Start: 2020-08-31 — End: 2020-09-29

## 2020-08-31 NOTE — Telephone Encounter (Signed)
Patient uploaded new insurance information. Refill for Stelara 90 mg/ml every 4 weeks has been sent to [facility], updated insurance information faxed to [facility] via epic.

## 2020-09-01 NOTE — Telephone Encounter (Signed)
Spoke with Gay Filler at [facility] in regards to letter that was received from [facility] on 08/14/20. She states that patient will be able to refill Stelara on 09/10/20, she requested that I fax the letter to them at [phone] so that they can review it as well. She is aware that I faxed patient's updated insurance as well.   Will follow up with patient's insurance company.

## 2020-09-02 NOTE — Telephone Encounter (Signed)
Called [employer] at [phone] to further discuss authorization for Stelara. Transferred to [facility] PA department [phone]) and spoke with Verdis Frederickson. Prior authorization has been initiated, turn around time for a response is 4-7 days.  PA Reference # [id number]

## 2020-09-03 NOTE — Telephone Encounter (Signed)
Spoke with Leanna Sato at [employer] prior authorization department, he states that patient has been approved for Stelara 90 mg/ml every 28 days. Patient will need to call [facility] specialty pharmacy to set up shipment on or after 09/10/20.  Spoke with patient and she is aware that I will send this information to her My Chart for review. Patient verbalized understanding of all information and had no concerns at the end of the call.   A copy of approval has been sent to be scanned into patient's chart.

## 2020-09-29 ENCOUNTER — Telehealth: Payer: Self-pay | Admitting: Gastroenterology

## 2020-09-29 ENCOUNTER — Other Ambulatory Visit: Payer: Self-pay

## 2020-09-29 MED ORDER — STELARA 90 MG/ML ~~LOC~~ SOSY
PREFILLED_SYRINGE | SUBCUTANEOUS | 4 refills | Status: DC
Start: 2020-09-29 — End: 2021-03-16

## 2020-09-29 NOTE — Telephone Encounter (Signed)
Spoke with Colletta [place] at [employer] she states that patient told them that she received the prescription somewhere else.   [place] and spoke with Leanna Sato, he states that they need the prescription sent to them directly although this was already approved. Verbal order given to April, pharmacy technician at [facility]. She states that they will get this information entered and will contact the patient either later today or tomorrow.   Spoke with patient and she is aware.

## 2020-09-29 NOTE — Telephone Encounter (Signed)
Pt is requesting a call back from a nurse, pt states the specialty pharmacy does not have the prescription for the Stelara.

## 2020-10-02 ENCOUNTER — Other Ambulatory Visit: Payer: Self-pay | Admitting: Gastroenterology

## 2020-10-18 ENCOUNTER — Other Ambulatory Visit: Payer: Self-pay | Admitting: Family Medicine

## 2020-12-02 ENCOUNTER — Telehealth: Payer: Self-pay | Admitting: Gastroenterology

## 2020-12-02 NOTE — Telephone Encounter (Signed)
Spoke with patient, she states that she booked a trip and did not realize that she wouldn't be able to get a refund unless she had proof that it was medically necessary that she cancel. Patient is requesting a letter stating that she is not able to travel due to her autoimmune disease (Crohn's). Please advise, thanks.

## 2020-12-03 ENCOUNTER — Encounter: Payer: Self-pay | Admitting: Gastroenterology

## 2020-12-03 NOTE — Telephone Encounter (Signed)
The amount of increased risk of contracting infection while traveling because she is on the immunosuppressive medication Stelara may be increased, but is not known with certainty.  I would not say that that medication is a reason why she absolutely cannot travel.  However, she may choose to avoid travel and lower that risk.  I will write a brief letter stating that she is on immunosuppressive therapy.  It will be in her epic chart and she can send it to whomever to request consideration of a travel refund.  - HD

## 2020-12-04 NOTE — Telephone Encounter (Signed)
Spoke with patient in regards to information. She is aware that the letter is available to her via My Chart. Advised that if she could not see the letter for some reason to let us know. Patient also wanted to know if she should get the additional COVID booster. She has been fully vaccinated and has already received 1 booster, she states that she is still taking all precautions when she goes out. Please advise, thanks.

## 2020-12-09 ENCOUNTER — Other Ambulatory Visit: Payer: Self-pay

## 2020-12-09 ENCOUNTER — Ambulatory Visit (INDEPENDENT_AMBULATORY_CARE_PROVIDER_SITE_OTHER): Payer: Medicare Other

## 2020-12-09 VITALS — BP 102/60 | HR 55 | Ht 64.0 in | Wt 150.0 lb

## 2020-12-09 DIAGNOSIS — Z0000 Encounter for general adult medical examination without abnormal findings: Secondary | ICD-10-CM | POA: Diagnosis not present

## 2020-12-09 NOTE — Patient Instructions (Signed)
You spoke to Marissa Blankenship, [place] for your annual wellness visit.  We discussed goals: Goals    . Exercise 3x per week (30 min per time)     Dicussed walking for physical and mental health.  Patient enjoys short walks from "time to time" already.       We also discussed recommended health maintenance. Please call our office and schedule a visit. As discussed, you are due for the following.  Health Maintenance  Topic Date Due  . PAP SMEAR-Modifier  09/13/2018  . MAMMOGRAM  12/09/2018  . INFLUENZA VACCINE  02/15/2021  . TETANUS/TDAP  07/05/2021  . COLONOSCOPY (Pts 45-60yr Insurance coverage will need to be confirmed)  05/22/2029  . COVID-19 Vaccine  Completed  . Hepatitis C Screening  Completed  . HIV Screening  Completed  . HPV VACCINES  Aged Out   PCP apt made for 01/05/2021 @10 :30am. Mammogram due. Pap Smear due. You are eligible for #2 covid booster.  You can make a nurse visit anytime for this.  Look over the advance directive packet I gave you.   We also discussed smoking cessation. 1-800-QUIT-NOW  Preventive Care [id number] Old, Female Preventive care refers to lifestyle choices and visits with your health care provider that can promote health and wellness. This includes:  A yearly physical exam. This is also called an annual wellness visit.  Regular dental and eye exams.  Immunizations.  Screening for certain conditions.  Healthy lifestyle choices, such as: ? Eating a healthy diet. ? Getting regular exercise. ? Not using drugs or products that contain nicotine and tobacco. ? Limiting alcohol use. What can I expect for my preventive care visit? Physical exam Your health care provider will check your:  Height and weight. These may be used to calculate your BMI (body mass index). BMI is a measurement that tells if you are at a healthy weight.  Heart rate and blood pressure.  Body temperature.  Skin for abnormal spots. Counseling Your health care  provider may ask you questions about your:  Past medical problems.  Family's medical history.  Alcohol, tobacco, and drug use.  Emotional well-being.  Home life and relationship well-being.  Sexual activity.  Diet, exercise, and sleep habits.  Work and work eStatistician  Access to firearms.  Method of birth control.  Menstrual cycle.  Pregnancy history. What immunizations do I need? Vaccines are usually given at various ages, according to a schedule. Your health care provider will recommend vaccines for you based on your age, medical history, and lifestyle or other factors, such as travel or where you work.   What tests do I need? Blood tests  Lipid and cholesterol levels. These may be checked every 5 years, or more often if you are over 548years old.  Hepatitis C test.  Hepatitis B test. Screening  Lung cancer screening. You may have this screening every year starting at age 3692if you have a 30-pack-year history of smoking and currently smoke or have quit within the past 15 years.  Colorectal cancer screening. ? All adults should have this screening starting at age 3621and continuing until age 53 ? Your health care provider may recommend screening at age 2170if you are at increased risk. ? You will have tests every 1-10 years, depending on your results and the type of screening test.  Diabetes screening. ? This is done by checking your blood sugar (glucose) after you have not eaten for a while (fasting). ? You may  have this done every 1-3 years.  Mammogram. ? This may be done every 1-2 years. ? Talk with your health care provider about when you should start having regular mammograms. This may depend on whether you have a family history of breast cancer.  BRCA-related cancer screening. This may be done if you have a family history of breast, ovarian, tubal, or peritoneal cancers.  Pelvic exam and Pap test. ? This may be done every 3 years starting at age  64. ? Starting at age 72, this may be done every 5 years if you have a Pap test in combination with an HPV test. Other tests  STD (sexually transmitted disease) testing, if you are at risk.  Bone density scan. This is done to screen for osteoporosis. You may have this scan if you are at high risk for osteoporosis. Talk with your health care provider about your test results, treatment options, and if necessary, the need for more tests. Follow these instructions at home: Eating and drinking  Eat a diet that includes fresh fruits and vegetables, whole grains, lean protein, and low-fat dairy products.  Take vitamin and mineral supplements as recommended by your health care provider.  Do not drink alcohol if: ? Your health care provider tells you not to drink. ? You are pregnant, may be pregnant, or are planning to become pregnant.  If you drink alcohol: ? Limit how much you have to 0-1 drink a day. ? Be aware of how much alcohol is in your drink. In the [place] one drink equals one 12 oz bottle of beer (355 mL), one 5 oz glass of wine (148 mL), or one 1 oz glass of hard liquor (44 mL).   Lifestyle  Take daily care of your teeth and gums. Brush your teeth every morning and night with fluoride toothpaste. Floss one time each day.  Stay active. Exercise for at least 30 minutes 5 or more days each week.  Do not use any products that contain nicotine or tobacco, such as cigarettes, e-cigarettes, and chewing tobacco. If you need help quitting, ask your health care provider.  Do not use drugs.  If you are sexually active, practice safe sex. Use a condom or other form of protection to prevent STIs (sexually transmitted infections).  If you do not wish to become pregnant, use a form of birth control. If you plan to become pregnant, see your health care provider for a prepregnancy visit.  If told by your health care provider, take low-dose aspirin daily starting at age 53.  Find healthy ways  to cope with stress, such as: ? Meditation, yoga, or listening to music. ? Journaling. ? Talking to a trusted person. ? Spending time with friends and family. Safety  Always wear your seat belt while driving or riding in a vehicle.  Do not drive: ? If you have been drinking alcohol. Do not ride with someone who has been drinking. ? When you are tired or distracted. ? While texting.  Wear a helmet and other protective equipment during sports activities.  If you have firearms in your house, make sure you follow all gun safety procedures. What's next?  Visit your health care provider once a year for an annual wellness visit.  Ask your health care provider how often you should have your eyes and teeth checked.  Stay up to date on all vaccines. This information is not intended to replace advice given to you by your health care provider. Make sure you discuss  any questions you have with your health care provider. Document Revised: 04/07/2020 Document Reviewed: 03/15/2018 Elsevier Patient Education  2021 [place].   Our clinic's number is [phone]. Please call with questions or concerns about what we discussed today.

## 2020-12-09 NOTE — Progress Notes (Addendum)
 Subjective:   Marissa Blankenship is a 54 y.o. female who presents for Medicare Annual (Subsequent) preventive examination.  Review of Systems: Defer to PCP.  Cardiac Risk Factors include: hypertension;sedentary lifestyle;smoking/ tobacco exposure  Objective:   Vitals: BP 102/60   Pulse (!) 55   Ht 5' 4" (1.626 m)   Wt 150 lb (68 kg)   LMP 02/07/2017 (Approximate)   SpO2 98%   BMI 25.75 kg/m   Body mass index is 25.75 kg/m.  Advanced Directives 12/09/2020 01/02/2020 06/03/2019 04/16/2018 09/19/2017 01/29/2017 05/25/2016  Does Patient Have a Medical Advance Directive? No No No No No No No  Would patient like information on creating a medical advance directive? Yes (MAU/Ambulatory/Procedural Areas - Information given) No - Patient declined No - Patient declined No - Patient declined No - Patient declined No - Patient declined -   Tobacco Social History   Tobacco Use  Smoking Status Current Some Day Smoker  . Types: Cigarettes  Smokeless Tobacco Never Used  Tobacco Comment   Smokes ~1-3 ciggs per day      Ready to quit: No Counseling given: Yes Comment: Smokes ~1-3 ciggs per day   Clinical Intake:  Pre-visit preparation completed: Yes  Pain Score: 0-No pain  How often do you need to have someone help you when you read instructions, pamphlets, or other written materials from your doctor or pharmacy?: 1 - Never What is the last grade level you completed in school?: College  Past Medical History:  Diagnosis Date  . Acid reflux   . Allergy   . Anemia   . Anxiety   . Crohn's disease (HCC)   . Depression   . Hypertension   . IBS (irritable bowel syndrome)    spastic colon  . Plantar fasciitis   . PRE-ECLAMPSIA 06/18/2007   Qualifier: Diagnosis of  By: Mulberry MD, Elizabeth    . Shortness of breath   . Small bowel obstruction (HCC)    Past Surgical History:  Procedure Laterality Date  . CESAREAN SECTION     2  . COLONOSCOPY    . COLONOSCOPY WITH PROPOFOL N/A 12/02/2015    Procedure: COLONOSCOPY WITH PROPOFOL;  Surgeon: Henry L Danis III, MD;  Location: [facility];  Service: Gastroenterology;  Laterality: N/A;  . ESOPHAGOGASTRODUODENOSCOPY    . LAPAROSCOPIC ILEOCECECTOMY N/A 01/31/2017   Procedure: LAPAROSCOPIC LYSIS OF ADHESIONS, ILEOCECECTOMY, STRICTUROPLASTY, BLADDER REPAIR, RIGHT SALPINGECTOMY;  Surgeon: Gross, Steven, MD;  Location: [facility];  Service: General;  Laterality: N/A;  . UPPER GASTROINTESTINAL ENDOSCOPY    . WISDOM TOOTH EXTRACTION     Family History  Problem Relation Age of Onset  . Hypertension Father 60  . Hypertension Mother 60  . Crohn's disease Sister   . Diabetes Paternal Grandmother   . Hypertension Brother   . Colon cancer Neg Hx   . Pancreatic cancer Neg Hx   . Stomach cancer Neg Hx   . Esophageal cancer Neg Hx   . Rectal cancer Neg Hx    Social History   Socioeconomic History  . Marital status: Divorced    Spouse name: Not on file  . Number of children: 3  . Years of education: Not on file  . Highest education level: Associate degree: academic program  Occupational History  . Occupation: disability   Tobacco Use  . Smoking status: Current Some Day Smoker    Types: Cigarettes  . Smokeless tobacco: Never Used  . Tobacco comment: Smokes ~1-3 ciggs per day   Vaping   Use  . Vaping Use: Never used  Substance and Sexual Activity  . Alcohol use: Yes    Comment: Occassionally   . Drug use: No  . Sexual activity: Yes    Partners: Male  Other Topics Concern  . Not on file  Social History Narrative   Patient lives in [place] with her two sons.    Patient has a daughter who she is close with.    Patient enjoys walking for exercise.   Patient enjoys spending time with her grandchildren.       Social Determinants of Health   Financial Resource Strain: Low Risk   . Difficulty of Paying Living Expenses: Not hard at all  Food Insecurity: No Food Insecurity  . Worried About Running Out of Food in the Last Year: Never  true  . Ran Out of Food in the Last Year: Never true  Transportation Needs: No Transportation Needs  . Lack of Transportation (Medical): No  . Lack of Transportation (Non-Medical): No  Physical Activity: Inactive  . Days of Exercise per Week: 0 days  . Minutes of Exercise per Session: 0 min  Stress: Stress Concern Present  . Feeling of Stress : To some extent  Social Connections: Socially Isolated  . Frequency of Communication with Friends and Family: More than three times a week  . Frequency of Social Gatherings with Friends and Family: More than three times a week  . Attends Religious Services: Never  . Active Member of Clubs or Organizations: No  . Attends Club or Organization Meetings: Never  . Marital Status: Divorced   Outpatient Encounter Medications as of 12/09/2020  Medication Sig  . amLODipine (NORVASC) 10 MG tablet TAKE 1 TABLET BY MOUTH EVERY DAY  . escitalopram (LEXAPRO) 5 MG tablet TAKE 1 TABLET BY MOUTH EVERY DAY  . ferrous sulfate 325 (65 FE) MG tablet Take 325 mg by mouth daily with breakfast.  . omeprazole (PRILOSEC) 40 MG capsule TAKE 1 CAPSULE BY MOUTH EVERY DAY  . ondansetron (ZOFRAN) 8 MG tablet Take 1 tablet (8 mg total) by mouth every 8 (eight) hours as needed for nausea or vomiting.  . ustekinumab (STELARA) 90 MG/ML SOSY injection Inject into skin every 4 weeks  . PEG-KCl-NaCl-NaSulf-Na Asc-C (PLENVU) 140 g SOLR Take 1 kit by mouth as directed. (Patient not taking: Reported on 12/09/2020)   No facility-administered encounter medications on file as of 12/09/2020.   Activities of Daily Living In your present state of health, do you have any difficulty performing the following activities: 12/09/2020  Hearing? N  Vision? N  Difficulty concentrating or making decisions? N  Walking or climbing stairs? N  Dressing or bathing? N  Doing errands, shopping? N  Preparing Food and eating ? N  Using the Toilet? N  In the past six months, have you accidently leaked  urine? N  Do you have problems with loss of bowel control? N  Managing your Medications? N  Managing your Finances? N  Housekeeping or managing your Housekeeping? N  Some recent data might be hidden   Patient Care Team: Welborn, Ryan, DO as PCP - General Thomas, Alicia, MD as Consulting Physician (General Surgery) Danis, Henry L III, MD as Consulting Physician (Gastroenterology) Gross, Steven, MD as Consulting Physician (General Surgery)    Assessment:   This is a routine wellness examination for Marissa Blankenship.  Exercise Activities and Dietary recommendations Current Exercise Habits: The patient does not participate in regular exercise at present, Exercise limited by: None identified    Goals    . Exercise 3x per week (30 min per time)     Dicussed walking for physical and mental health.  Patient enjoys short walks from "time to time" already.       Fall Risk Fall Risk  12/09/2020 06/03/2019 01/28/2019 01/15/2019  Falls in the past year? 0 0 0 0  Number falls in past yr: - - - 0  Risk for fall due to : No Fall Risks - - -  Follow up Falls prevention discussed - - Falls evaluation completed   Is the patient's home free of loose throw rugs in walkways, pet beds, electrical cords, etc?  yes      Grab bars in the bathroom? yes      Handrails on the stairs?   yes      Adequate lighting?   yes  Patient rating of health (0-10) scale: 6  Depression Screen PHQ 2/9 Scores 12/09/2020 01/02/2020 01/02/2020 06/03/2019  PHQ - 2 Score _0 PHQ- 9 Score - 9 - -  Exception Documentation - - - -  Not completed - - - -    Cognitive Function  6CIT Screen 12/09/2020  What Year? 0 points  What month? 0 points  What time? 0 points  Count back from 20 0 points  Months in reverse 0 points  Repeat phrase 0 points  Total Score 0   Immunization History  Administered Date(s) Administered  . Hep A / Hep B 02/17/2016, 03/22/2016, 08/25/2016  . Influenza Whole 05/25/2010  . Influenza, Seasonal,  Injecte, Preservative Fre 05/10/2017  . Influenza,inj,Quad PF,6+ Mos 12/04/2015, 04/16/2018, 04/08/2019  . Influenza-Unspecified 04/03/2020  . PFIZER(Purple Top)SARS-COV-2 Vaccination 10/29/2019, 11/19/2019, 03/10/2020  . Pneumococcal Conjugate-13 03/05/2019  . Pneumococcal Polysaccharide-23 12/04/2015  . Rho (D) Immune Globulin 07/06/2011  . Td 07/18/1998  . Tdap 07/06/2011  . Zoster 08/15/2019  . Zoster Recombinat (Shingrix) 05/14/2019   Screening Tests Health Maintenance  Topic Date Due  . PAP SMEAR-Modifier  09/13/2018  . MAMMOGRAM  12/09/2018  . INFLUENZA VACCINE  02/15/2021  . TETANUS/TDAP  07/05/2021  . COLONOSCOPY (Pts 45-43yr Insurance coverage will need to be confirmed)  05/22/2029  . COVID-19 Vaccine  Completed  . Hepatitis C Screening  Completed  . HIV Screening  Completed  . HPV VACCINES  Aged Out   Cancer Screenings: Lung: Low Dose CT Chest recommended if Age 53-80years, 30 pack-year currently smoking OR have quit w/in 15years. Patient does not qualify. Breast:  Up to date on Mammogram? No   Up to date of Bone Density/Dexa? NA Colorectal: UTD  Additional Screenings: Hepatitis C Screening: Completed   Plan:  PCP apt made for 01/05/2021 _1 :30am. Mammogram due. Pap Smear due. You are eligible for #2 covid booster.  You can make a nurse visit anytime for this.  Look over the advance directive packet I gave you.   I have personally reviewed and noted the following in the patient's chart:   . Medical and social history . Use of alcohol, tobacco or illicit drugs  . Current medications and supplements . Functional ability and status . Nutritional status . Physical activity . Advanced directives . List of other physicians . Hospitalizations, surgeries, and ER visits in previous 12 months . Vitals . Screenings to include cognitive, depression, and falls . Referrals and appointments  In addition, I have reviewed and discussed with patient certain  preventive protocols, quality metrics, and best practice recommendations. A written personalized care plan for preventive  services as well as general preventive health recommendations were provided to patient.  Dorna Bloom, [place]  12/09/2020    I have reviewed this visit and agree with the documentation.    I have reviewed this visit and agree with the documentation.

## 2020-12-10 NOTE — Telephone Encounter (Signed)
Spoke with patient, advised that I will mail letter today. Patient confirmed address on file. Patient had no other concerns at the end of the call.

## 2020-12-10 NOTE — Telephone Encounter (Signed)
Pt needs letter that was sent through [place] mail to her addreess because she cannot print it.

## 2021-01-04 NOTE — Progress Notes (Signed)
    SUBJECTIVE:   CHIEF COMPLAINT / HPI:   Encounter for screening for cervical cancer: Patient's most recent Pap smear was in 2017 and was negative.  Presents today for Pap smear repeat.  No other concerns at this time.  Prediabetes: Patient with a previous A1c of 5.7 followed by 5.1.  Diet controlled.  A1c today of 5.4  PERTINENT  PMH / PSH: None relevant  OBJECTIVE:   BP (!) 141/77   Pulse 74   Ht 5' 4"  (1.626 m)   Wt 154 lb (69.9 kg)   LMP 02/07/2017 (Approximate)   SpO2 96%   BMI 26.43 kg/m    General: NAD, pleasant, able to participate in exam Respiratory: No respiratory distress Pelvic exam: VULVA: normal appearing vulva with no masses, tenderness or lesions, VAGINA: normal appearing vagina with normal color and discharge, no lesions, CERVIX: normal appearing cervix without discharge or lesions.  Chaperone: Tishira Neuro: alert, no obvious focal deficits Psych: Normal affect and mood  ASSESSMENT/PLAN:   Encounter for screening for cervical cancer Pap smear performed  Screening for breast cancer Patient due for mammogram. This is been ordered  Screening for hyperlipidemia: We will check lipid panel  History of prediabetes: A1c today of 5.4.  Diet controlled.  No interventions needed.  Recheck in 6 to 12 months  Marissa Blankenship

## 2021-01-04 NOTE — Patient Instructions (Signed)
It was great to see you! Thank you for allowing me to participate in your care!  I recommend that you always bring your medications to each appointment as this makes it easy to ensure we are on the correct medications and helps [place] not miss when refills are needed.  Our plans for today:  -For your blood pressure I recommend that you get a blood pressure cuff and check it from time to time at home after you have rested for at least 5 minutes.  You can either send me a MyChart message with these numbers after you have 6 or 7 numbers or we can follow-up in a couple of weeks to recheck it.  I do not believe we need to do anything at this time for it.  We will check your cholesterol panel today.  We also performed a Pap smear today.  -I recommend that you get a mammogram.  This has been ordered.  We are checking some labs today, I will call you if they are abnormal will send you a MyChart message or a letter if they are normal.  If you do not hear about your labs in the next 2 weeks please let us know.  Take care and seek immediate care sooner if you develop any concerns.   Dr. [name], [place]

## 2021-01-05 ENCOUNTER — Ambulatory Visit (INDEPENDENT_AMBULATORY_CARE_PROVIDER_SITE_OTHER): Payer: Medicare Other | Admitting: Family Medicine

## 2021-01-05 ENCOUNTER — Encounter: Payer: Self-pay | Admitting: Family Medicine

## 2021-01-05 ENCOUNTER — Other Ambulatory Visit: Payer: Self-pay

## 2021-01-05 ENCOUNTER — Other Ambulatory Visit (HOSPITAL_COMMUNITY)
Admission: RE | Admit: 2021-01-05 | Discharge: 2021-01-05 | Disposition: A | Discharge status: Home / Self Care | Payer: Medicare Other | Source: Ambulatory Visit | Attending: Family Medicine | Admitting: Family Medicine

## 2021-01-05 VITALS — BP 141/77 | HR 74 | Ht 64.0 in | Wt 154.0 lb

## 2021-01-05 DIAGNOSIS — Z01419 Encounter for gynecological examination (general) (routine) without abnormal findings: Secondary | ICD-10-CM | POA: Insufficient documentation

## 2021-01-05 DIAGNOSIS — E1169 Type 2 diabetes mellitus with other specified complication: Secondary | ICD-10-CM | POA: Diagnosis not present

## 2021-01-05 DIAGNOSIS — Z1231 Encounter for screening mammogram for malignant neoplasm of breast: Secondary | ICD-10-CM

## 2021-01-05 DIAGNOSIS — Z124 Encounter for screening for malignant neoplasm of cervix: Secondary | ICD-10-CM | POA: Diagnosis not present

## 2021-01-05 DIAGNOSIS — Z1151 Encounter for screening for human papillomavirus (HPV): Secondary | ICD-10-CM | POA: Diagnosis not present

## 2021-01-05 DIAGNOSIS — I10 Essential (primary) hypertension: Secondary | ICD-10-CM

## 2021-01-05 DIAGNOSIS — R7303 Prediabetes: Secondary | ICD-10-CM

## 2021-01-05 LAB — POCT GLYCOSYLATED HEMOGLOBIN (HGB A1C): Hemoglobin A1C: 5.4 % (ref 4.0–5.6)

## 2021-01-05 NOTE — Assessment & Plan Note (Signed)
Blood pressure 144/77 in the office today.  Previous blood pressures have all been normotensive over the last few office visits.  Patient plans to check her pressures at home and will let me know if they continue to be elevated.  I discussed about getting an automated arm blood pressure cuff and avoiding a wrist blood pressure cuff as it may not be as accurate.  Patient will either send a MyChart message with her blood pressures or we will follow-up in a couple weeks to recheck.

## 2021-01-06 LAB — CYTOLOGY - PAP
Comment: NEGATIVE
Diagnosis: NEGATIVE
High risk HPV: NEGATIVE

## 2021-01-06 LAB — LIPID PANEL
Chol/HDL Ratio: 2.6 ratio (ref 0.0–4.4)
Cholesterol, Total: 235 mg/dL — ABNORMAL HIGH (ref 100–199)
HDL: 92 mg/dL (ref >39)
LDL Chol Calc (NIH): 127 mg/dL — ABNORMAL HIGH (ref 0–99)
Triglycerides: 93 mg/dL (ref 0–149)
VLDL Cholesterol Cal: 16 mg/dL (ref 5–40)

## 2021-01-06 LAB — HEMOGLOBIN A1C
Est. average glucose Bld gHb Est-mCnc: 103 mg/dL
Hgb A1c MFr Bld: 5.2 % (ref 4.8–5.6)

## 2021-03-15 ENCOUNTER — Other Ambulatory Visit: Payer: Self-pay | Admitting: Gastroenterology

## 2021-03-16 NOTE — Telephone Encounter (Signed)
She needs next available follow-up to see me for her Crohn's disease  I will electronically approve the medication refill.

## 2021-03-24 ENCOUNTER — Ambulatory Visit (HOSPITAL_COMMUNITY)
Admission: EM | Admit: 2021-03-24 | Discharge: 2021-03-24 | Disposition: A | Discharge status: Home / Self Care | Payer: Medicare Other | Attending: Family Medicine | Admitting: Family Medicine

## 2021-03-24 ENCOUNTER — Ambulatory Visit (INDEPENDENT_AMBULATORY_CARE_PROVIDER_SITE_OTHER): Payer: Medicare Other

## 2021-03-24 ENCOUNTER — Encounter (HOSPITAL_COMMUNITY): Payer: Self-pay | Admitting: Emergency Medicine

## 2021-03-24 DIAGNOSIS — M7989 Other specified soft tissue disorders: Secondary | ICD-10-CM | POA: Diagnosis not present

## 2021-03-24 DIAGNOSIS — W19XXXA Unspecified fall, initial encounter: Secondary | ICD-10-CM

## 2021-03-24 DIAGNOSIS — M25561 Pain in right knee: Secondary | ICD-10-CM | POA: Diagnosis not present

## 2021-03-24 DIAGNOSIS — S72411A Displaced unspecified condyle fracture of lower end of right femur, initial encounter for closed fracture: Secondary | ICD-10-CM

## 2021-03-24 NOTE — ED Triage Notes (Signed)
Pt reports fell yesterday and c/o right knee pain

## 2021-03-24 NOTE — ED Provider Notes (Signed)
[facility]    CSN: [id number] Arrival date & time: 03/24/21  1020      History   Chief Complaint Chief Complaint  Patient presents with   Knee Injury    HPI Danetta Prom is a 55 y.o. female.   Patient presenting today with right anterior knee pain, swelling, stiffness after falling directly onto the knee during a mechanical fall yesterday.  She has been icing and elevating the area and trying to stay off of it is much as possible but has not gotten any improvement.  Denies radiation of pain, numbness, tingling, inability to move.  Past Medical History:  Diagnosis Date   Acid reflux    Allergy    Anemia    Anxiety    Crohn's disease ([place])    Depression    Hypertension    IBS (irritable bowel syndrome)    spastic colon   Plantar fasciitis    PRE-ECLAMPSIA 06/18/2007   Qualifier: Diagnosis of  By: Amil Amen MD, Benard Rink of breath    Small bowel obstruction [facility])    Patient Active Problem List   Diagnosis Date Noted   Fatigue 01/02/2020   Bilateral hearing loss due to cerumen impaction 01/29/2019   B12 deficiency 07/06/2018   Anxiety and depression 04/16/2018   Crohn's disease with complication ([place]) 65/53/7482   Impacted cerumen of right ear 09/19/2017   Hyponatremia 02/02/2017   Exacerbation of Crohn's disease with intestinal obstruction status post ileal stricturoplasty and ileocecal resection 01/31/2017 01/30/2017   Anemia 01/30/2017   Protein-calorie malnutrition, severe 12/02/2015   PLANTAR FASCIITIS, LEFT 05/25/2010   Essential hypertension 03/28/2007   ACID REFLUX DISEASE 03/28/2007    Past Surgical History:  Procedure Laterality Date   CESAREAN SECTION     2   COLONOSCOPY     COLONOSCOPY WITH PROPOFOL N/A 12/02/2015   Procedure: COLONOSCOPY WITH PROPOFOL;  Surgeon: Doran Stabler, MD;  Location: [facility];  Service: Gastroenterology;  Laterality: N/A;   ESOPHAGOGASTRODUODENOSCOPY     LAPAROSCOPIC ILEOCECECTOMY N/A  01/31/2017   Procedure: LAPAROSCOPIC LYSIS OF ADHESIONS, ILEOCECECTOMY, STRICTUROPLASTY, BLADDER REPAIR, RIGHT SALPINGECTOMY;  Surgeon: Michael Boston, MD;  Location: [facility];  Service: General;  Laterality: N/A;   UPPER GASTROINTESTINAL ENDOSCOPY     WISDOM TOOTH EXTRACTION      OB History     Gravida  6   Para  4   Term      Preterm  3   AB  1   Living  3      SAB      IAB  1   Ectopic      Multiple      Live Births  4            Home Medications    Prior to Admission medications   Medication Sig Start Date End Date Taking? Authorizing Provider  amLODipine (NORVASC) 10 MG tablet TAKE 1 TABLET BY MOUTH EVERY DAY 10/19/20   [name], DO  escitalopram (LEXAPRO) 5 MG tablet TAKE 1 TABLET BY MOUTH EVERY DAY 10/17/18   Lucila Maine C, DO  ferrous sulfate 325 (65 FE) MG tablet Take 325 mg by mouth daily with breakfast.    [provider]  omeprazole (PRILOSEC) 40 MG capsule TAKE 1 CAPSULE BY MOUTH EVERY DAY 10/02/20   Doran Stabler, MD  ondansetron (ZOFRAN) 8 MG tablet Take 1 tablet (8 mg total) by mouth every 8 (eight) hours as needed  for nausea or vomiting. 08/14/18   Doran Stabler, MD  PEG-KCl-NaCl-NaSulf-Na Asc-C (PLENVU) 140 g SOLR Take 1 kit by mouth as directed. Patient not taking: Reported on 12/09/2020 06/16/20   Doran Stabler, MD  ustekinumab Delsa Grana) 90 MG/ML SOSY injection INJECT 1 SYRINGE  SUBCUTANEOUSLY EVERY 4  WEEKS 03/16/21   Doran Stabler, MD   Family History Family History  Problem Relation Age of Onset   Hypertension Father 75   Hypertension Mother 66   Crohn's disease Sister    Diabetes Paternal Grandmother    Hypertension Brother    Colon cancer Neg Hx    Pancreatic cancer Neg Hx    Stomach cancer Neg Hx    Esophageal cancer Neg Hx    Rectal cancer Neg Hx    Social History Social History   Tobacco Use   Smoking status: Some Days    Types: Cigarettes   Smokeless tobacco: Never   Tobacco comments:     Smokes ~1-3 ciggs per day   Vaping Use   Vaping Use: Never used  Substance Use Topics   Alcohol use: Yes    Comment: Occassionally    Drug use: No     Allergies   Patient has no known allergies.   Review of Systems Review of Systems Per HPI  Physical Exam Triage Vital Signs ED Triage Vitals  Enc Vitals Group     BP 03/24/21 1153 (!) 160/104     Pulse Rate 03/24/21 1153 62     Resp 03/24/21 1153 18     Temp 03/24/21 1153 98.3 F (36.8 C)     Temp Source 03/24/21 1153 Oral     SpO2 03/24/21 1153 98 %     Weight --      Height --      Head Circumference --      Peak Flow --      Pain Score 03/24/21 1150 5     Pain Loc --      Pain Edu? --      Excl. in [place]? --    No data found.  Updated Vital Signs BP (!) 160/104 (BP Location: Right Arm)   Pulse 62   Temp 98.3 F (36.8 C) (Oral)   Resp 18   LMP 02/07/2017 (Approximate)   SpO2 98%   Visual Acuity Right Eye Distance:   Left Eye Distance:   Bilateral Distance:    Right Eye Near:   Left Eye Near:    Bilateral Near:     Physical Exam Vitals and nursing note reviewed.  Constitutional:      Appearance: Normal appearance. She is not ill-appearing.  HENT:     Head: Atraumatic.  Eyes:     Extraocular Movements: Extraocular movements intact.     Conjunctiva/sclera: Conjunctivae normal.  Cardiovascular:     Rate and Rhythm: Normal rate and regular rhythm.     Heart sounds: Normal heart sounds.  Pulmonary:     Effort: Pulmonary effort is normal.     Breath sounds: Normal breath sounds.  Musculoskeletal:        General: Swelling, tenderness and signs of injury present. No deformity. Normal range of motion.     Cervical back: Normal range of motion and neck supple.     Comments: Moderate anterior knee effusion on the right side, significantly tender to palpation diffusely across anterior surface and lateral joint line.  Good passive range of motion, antalgic gait and pain with  range of motion.  No joint  instability noted on exam, negative drawer testing and McMurray's testing.  No apparent injuries from the fall  Skin:    General: Skin is warm and dry.  Neurological:     Mental Status: She is alert and oriented to person, place, and time.  Psychiatric:        Mood and Affect: Mood normal.        Thought Content: Thought content normal.        Judgment: Judgment normal.     UC Treatments / Results  Labs (all labs ordered are listed, but only abnormal results are displayed) Labs Reviewed - No data to display  EKG   Radiology DG Knee Complete 4 Views Right  Result Date: 03/24/2021 CLINICAL DATA:  Fall, right knee pain EXAM: RIGHT KNEE - COMPLETE 4+ VIEW COMPARISON:  None. FINDINGS: 4 mm bony density along the peripheral margin of the lateral femoral condyle possibly representing a minimally displaced fracture fragment. Osseous structures appear otherwise intact. Small knee joint effusion. No radiographic evidence of a lipohemarthrosis. Prepatellar soft tissue swelling. IMPRESSION: 1. Small bony density along the peripheral margin of the lateral femoral condyle possibly representing a minimally displaced fracture fragment. 2. Small knee joint effusion without radiographic evidence of a lipohemarthrosis. 3. Prepatellar soft tissue swelling. Electronically Signed   By: Davina Poke D.O.   On: 03/24/2021 12:37    Procedures Procedures (including critical care time)  Medications Ordered in UC Medications - No data to display  Initial Impression / Assessment and Plan / UC Course  I have reviewed the triage vital signs and the nursing notes.  Pertinent labs & imaging results that were available during my care of the patient were reviewed by me and considered in my medical decision making (see chart for details).     Small lateral femoral condyle fracture with displacement present on x-ray of the right knee in addition to a knee effusion.  Will err on the side of caution and place in a  long-leg splint, give crutches and remain non weightbearing until able to follow-up with orthopedics.  RICE protocol reviewed.  Return for acutely worsening symptoms.  Final Clinical Impressions(s) / UC Diagnoses   Final diagnoses:  Closed displaced fracture of condyle of right femur, initial encounter [facility])   Discharge Instructions   None    ED Prescriptions   None    PDMP not reviewed this encounter.   Volney American, [place] 03/24/21 [phone]

## 2021-03-26 ENCOUNTER — Other Ambulatory Visit: Payer: Self-pay | Admitting: Gastroenterology

## 2021-03-30 ENCOUNTER — Ambulatory Visit: Payer: Medicare Other | Admitting: Podiatrist

## 2021-03-31 DIAGNOSIS — M79604 Pain in right leg: Secondary | ICD-10-CM | POA: Diagnosis not present

## 2021-04-01 ENCOUNTER — Other Ambulatory Visit: Payer: Self-pay

## 2021-04-01 ENCOUNTER — Ambulatory Visit (INDEPENDENT_AMBULATORY_CARE_PROVIDER_SITE_OTHER): Payer: Medicare Other | Admitting: Podiatrist

## 2021-04-01 ENCOUNTER — Encounter: Payer: Self-pay | Admitting: Podiatrist

## 2021-04-01 DIAGNOSIS — L851 Acquired keratosis [keratoderma] palmaris et plantaris: Secondary | ICD-10-CM

## 2021-04-01 DIAGNOSIS — M216X2 Other acquired deformities of left foot: Secondary | ICD-10-CM | POA: Diagnosis not present

## 2021-04-01 NOTE — Patient Instructions (Signed)
Corns and Calluses Corns are small areas of thickened skin that form on the top, sides, or tip of a toe. Corns have a cone-shaped core with a point that can press on a nerve below. This causes pain. Calluses are areas of thickened skin that can form anywhere on the body, including the hands, fingers, palms, soles of the feet, and heels. Calluses are usually larger than corns. What are the causes? Corns and calluses are caused by rubbing (friction) or pressure, such as from shoes that are too tight or do not fit properly. What increases the risk? Corns are more likely to develop in people who have misshapen toes (toe deformities), such as hammer toes. Calluses can form with friction to any area of the skin. They are more likely to develop in people who: Work with their hands. Wear shoes that fit poorly, are too tight, or are high-heeled. Have toe deformities. What are the signs or symptoms? Symptoms of a corn or callus include: A hard growth on the skin. Pain or tenderness under the skin. Redness and swelling. Increased discomfort while wearing tight-fitting shoes, if your feet are affected. If a corn or callus becomes infected, symptoms may include: Redness and swelling that gets worse. Pain. Fluid, blood, or pus draining from the corn or callus. How is this diagnosed? Corns and calluses may be diagnosed based on your symptoms, your medical history, and a physical exam. How is this treated? Treatment for corns and calluses may include: Removing the cause of the friction or pressure. This may involve: Changing your shoes. Wearing shoe inserts (orthotics) or other protective layers in your shoes, such as a corn pad. Wearing gloves. Applying medicine to the skin (topical medicine) to help soften skin in the hardened, thickened areas. Removing layers of dead skin with a file to reduce the size of the corn or callus. Removing the corn or callus with a scalpel or laser. Taking antibiotic  medicines, if your corn or callus is infected. Having surgery, if a toe deformity is the cause. Follow these instructions at home:  Take over-the-counter and prescription medicines only as told by your health care provider. If you were prescribed an antibiotic medicine, take it as told by your health care provider. Do not stop taking it even if your condition improves. Wear shoes that fit well. Avoid wearing high-heeled shoes and shoes that are too tight or too loose. Wear any padding, protective layers, gloves, or orthotics as told by your health care provider. Soak your hands or feet. Then use a file or pumice stone to soften your corn or callus. Do this as told by your health care provider. Check your corn or callus every day for signs of infection. Contact a health care provider if: Your symptoms do not improve with treatment. You have redness or swelling that gets worse. Your corn or callus becomes painful. You have fluid, blood, or pus coming from your corn or callus. You have new symptoms. Get help right away if: You develop severe pain with redness. Summary Corns are small areas of thickened skin that form on the top, sides, or tip of a toe. These can be painful. Calluses are areas of thickened skin that can form anywhere on the body, including the hands, fingers, palms, and soles of the feet. Calluses are usually larger than corns. Corns and calluses are caused by rubbing (friction) or pressure, such as from shoes that are too tight or do not fit properly. Treatment may include wearing padding, protective  layers, gloves, or orthotics as told by your health care provider. This information is not intended to replace advice given to you by your health care provider. Make sure you discuss any questions you have with your health care provider. Document Revised: 10/31/2019 Document Reviewed: 10/31/2019 Elsevier Patient Education  2022 [employer].

## 2021-04-01 NOTE — Progress Notes (Signed)
Chief Complaint  Patient presents with   Callouses    Left sub 5th painful lesion, 2 month duration. Bilateral plantar callouses.     HPI: Patient is 55 y.o. female who presents today for the concerns as listed above.   Patient Active Problem List   Diagnosis Date Noted   Fatigue 01/02/2020   Bilateral hearing loss due to cerumen impaction 01/29/2019   B12 deficiency 07/06/2018   Anxiety and depression 04/16/2018   Crohn's disease with complication ([place]) 16/04/9603   Impacted cerumen of right ear 09/19/2017   Hyponatremia 02/02/2017   Exacerbation of Crohn's disease with intestinal obstruction status post ileal stricturoplasty and ileocecal resection 01/31/2017 01/30/2017   Anemia 01/30/2017   Protein-calorie malnutrition, severe 12/02/2015   Hypertrophy of uterus 09/14/2015   PLANTAR FASCIITIS, LEFT 05/25/2010   Essential hypertension 03/28/2007   ACID REFLUX DISEASE 03/28/2007    Current Outpatient Medications on File Prior to Visit  Medication Sig Dispense Refill   amLODipine (NORVASC) 10 MG tablet TAKE 1 TABLET BY MOUTH EVERY DAY 90 tablet 1   calcium-vitamin D (OSCAL WITH D) 500-200 MG-UNIT TABS tablet Oyster Shell Calcium-Vitamin D3 500 mg-5 mcg (200 unit) tablet  TAKE 1 TABLET BY MOUTH 2 (TWO) TIMES DAILY.     escitalopram (LEXAPRO) 5 MG tablet TAKE 1 TABLET BY MOUTH EVERY DAY 30 tablet 2   ferrous sulfate 325 (65 FE) MG tablet Take 325 mg by mouth daily with breakfast.     omeprazole (PRILOSEC) 40 MG capsule TAKE 1 CAPSULE BY MOUTH EVERY DAY 90 capsule 1   ondansetron (ZOFRAN) 8 MG tablet Take 1 tablet (8 mg total) by mouth every 8 (eight) hours as needed for nausea or vomiting. 45 tablet 3   PEG-KCl-NaCl-NaSulf-Na Asc-C (PLENVU) 140 g SOLR Take 1 kit by mouth as directed. 1 each 0   psyllium (METAMUCIL) 58.6 % packet Natural Daily Fiber 3.4 gram/5.8 gram oral powder  TAKE 1 PACKET BY MOUTH DAILY.     ustekinumab (STELARA) 90 MG/ML SOSY injection INJECT 1 SYRINGE   SUBCUTANEOUSLY EVERY 4  WEEKS 1 mL 2   No current facility-administered medications on file prior to visit.    No Known Allergies  Review of Systems No fevers, chills, nausea, muscle aches, no difficulty breathing, no calf pain, no chest pain or shortness of breath.   Physical Exam  GENERAL APPEARANCE: Alert, conversant. Appropriately groomed. No acute distress.   VASCULAR: Pedal pulses palpable DP and PT bilateral.  Capillary refill time is immediate to all digits,  Proximal to distal cooling it warm to warm.  Digital perfusion adequate.   NEUROLOGIC: sensation is intact to 5.07 monofilament at 5/5 sites bilateral.  Light touch is intact bilateral, vibratory sensation intact bilateral  MUSCULOSKELETAL: acceptable muscle strength, tone and stability bilateral.  Plantarflexed fifth metatarsal noted left and right.  DERMATOLOGIC: skin is warm, supple, and dry.  Discrete porokeratotic lesion is noted on the submetatarsal 5 left greater than right metatarsal heads.  Pain with direct pressure is noted.  Intractable keratotic lesion is present with a groundglass appearance    Assessment   1. Prominent metatarsal head of left foot   2. Acquired plantar porokeratosis      Plan  Discussed exam findings with the patient and recommended paring down the lesion with a #15 blade which was accomplished today without complication.  Excised the intractable core and applied salinocaine intake.  Recommended trying offloading pads to keep pressure off of this area.  If the lesion returns  she will call otherwise she will be seen back as needed for follow-up.

## 2021-04-20 ENCOUNTER — Ambulatory Visit
Admission: RE | Admit: 2021-04-20 | Discharge: 2021-04-20 | Disposition: A | Discharge status: Home / Self Care | Payer: Medicare Other | Source: Ambulatory Visit | Attending: Family Medicine | Admitting: Family Medicine

## 2021-04-20 ENCOUNTER — Other Ambulatory Visit: Payer: Self-pay

## 2021-04-20 DIAGNOSIS — Z1231 Encounter for screening mammogram for malignant neoplasm of breast: Secondary | ICD-10-CM

## 2021-04-28 ENCOUNTER — Ambulatory Visit: Payer: Medicare Other | Admitting: Gastroenterology

## 2021-04-28 DIAGNOSIS — H04123 Dry eye syndrome of bilateral lacrimal glands: Secondary | ICD-10-CM | POA: Diagnosis not present

## 2021-04-28 DIAGNOSIS — H524 Presbyopia: Secondary | ICD-10-CM | POA: Diagnosis not present

## 2021-04-28 DIAGNOSIS — H35033 Hypertensive retinopathy, bilateral: Secondary | ICD-10-CM | POA: Diagnosis not present

## 2021-04-28 DIAGNOSIS — H25013 Cortical age-related cataract, bilateral: Secondary | ICD-10-CM | POA: Diagnosis not present

## 2021-04-28 DIAGNOSIS — H2513 Age-related nuclear cataract, bilateral: Secondary | ICD-10-CM | POA: Diagnosis not present

## 2021-04-29 ENCOUNTER — Other Ambulatory Visit: Payer: Self-pay | Admitting: Family Medicine

## 2021-05-04 NOTE — Progress Notes (Signed)
To the   SUBJECTIVE:   CHIEF COMPLAINT / HPI:   Hypertension: Patient is a 55 y.o. female who present today for follow up of hypertension.   Patient endorses no problems  Home medications include: Amlodipine 10 mg Patient endorses taking these medications as prescribed.  Most recent creatinine trend:  Lab Results  Component Value Date   CREATININE 0.73 06/16/2020   CREATININE 0.66 05/23/2019   CREATININE 0.66 11/21/2018   Patient does check blood pressure at home. She sees numbers in the 140s-150s/80s-90s  Patient has not had a BMP in the past 1 year.   PERTINENT  PMH / PSH: HTN  OBJECTIVE:   BP (!) 151/88   Pulse 60   Ht 5' 4"  (1.626 m)   Wt 159 lb 3.2 oz (72.2 kg)   LMP 02/07/2017 (Approximate)   SpO2 100%   BMI 27.33 kg/m    General: NAD, pleasant, able to participate in exam Cardiac: RRR, no murmurs. Respiratory: CTAB, normal effort, No wheezes, rales or rhonchi Skin: warm and dry, no rashes noted Neuro: alert, no obvious focal deficits Psych: Normal affect and mood  ASSESSMENT/PLAN:   Essential hypertension We will start telimisartan. BMP today.  Patient is going to check her blood pressures over the next week and follow-up in 1 week for a BMP check and to see if we need to titrate the dose of telmisartan higher.  Continue on amlodipine 10 mg daily.   Will provide flu vaccination and COVID booster today.   [name], [place]

## 2021-05-07 ENCOUNTER — Other Ambulatory Visit: Payer: Self-pay

## 2021-05-07 ENCOUNTER — Encounter: Payer: Self-pay | Admitting: Family Medicine

## 2021-05-07 ENCOUNTER — Ambulatory Visit (INDEPENDENT_AMBULATORY_CARE_PROVIDER_SITE_OTHER): Payer: Medicare Other

## 2021-05-07 ENCOUNTER — Ambulatory Visit (INDEPENDENT_AMBULATORY_CARE_PROVIDER_SITE_OTHER): Payer: Medicare Other | Admitting: Family Medicine

## 2021-05-07 VITALS — BP 151/88 | HR 60 | Ht 64.0 in | Wt 159.2 lb

## 2021-05-07 DIAGNOSIS — Z23 Encounter for immunization: Secondary | ICD-10-CM

## 2021-05-07 DIAGNOSIS — I10 Essential (primary) hypertension: Secondary | ICD-10-CM

## 2021-05-07 MED ORDER — TELMISARTAN 40 MG PO TABS: 40.0000 mg | ORAL_TABLET | Freq: Every day | ORAL | 1 refills | Status: DC

## 2021-05-07 NOTE — Patient Instructions (Signed)
We are starting a medication called telmisartan for your blood pressure today.  We are checking some blood work today and I will let you know the results via MyChart when they return.  I want you to schedule an appointment for 1 week with myself or another provider first to check your pressure and see if we need to raise the dose of the medication higher.  We will check some blood work at that time as well.  We have also done your flu shot and your COVID booster today.

## 2021-05-07 NOTE — Assessment & Plan Note (Signed)
We will start telimisartan. BMP today.  Patient is going to check her blood pressures over the next week and follow-up in 1 week for a BMP check and to see if we need to titrate the dose of telmisartan higher.  Continue on amlodipine 10 mg daily.

## 2021-05-08 LAB — BASIC METABOLIC PANEL
BUN/Creatinine Ratio: 13 (ref 9–23)
BUN: 9 mg/dL (ref 6–24)
CO2: 25 mmol/L (ref 20–29)
Calcium: 9.2 mg/dL (ref 8.7–10.2)
Chloride: 102 mmol/L (ref 96–106)
Creatinine, Ser: 0.72 mg/dL (ref 0.57–1.00)
Glucose: 82 mg/dL (ref 70–99)
Potassium: 3.5 mmol/L (ref 3.5–5.2)
Sodium: 142 mmol/L (ref 134–144)
eGFR: 100 mL/min/{1.73_m2} (ref >59)

## 2021-05-20 ENCOUNTER — Ambulatory Visit (INDEPENDENT_AMBULATORY_CARE_PROVIDER_SITE_OTHER): Payer: Medicare Other

## 2021-05-20 ENCOUNTER — Other Ambulatory Visit: Payer: Self-pay | Admitting: Family Medicine

## 2021-05-20 ENCOUNTER — Other Ambulatory Visit: Payer: Self-pay

## 2021-05-20 VITALS — BP 124/73

## 2021-05-20 DIAGNOSIS — I10 Essential (primary) hypertension: Secondary | ICD-10-CM

## 2021-05-20 NOTE — Progress Notes (Deleted)
    SUBJECTIVE:   CHIEF COMPLAINT / HPI:   Blood pressure follow-up: 55 year old female presenting for the above.At our last appointment on 10/20 when she had a blood pressure of 151/88.  We started telmisartan at that time with plan for check her blood pressures and return this week for a BMP and blood pressure check.  She states***.  PERTINENT  PMH / PSH: ***  OBJECTIVE:   LMP 02/07/2017 (Approximate)  ***  General: NAD, pleasant, able to participate in exam Cardiac: RRR, no murmurs. Respiratory: CTAB, normal effort, No wheezes, rales or rhonchi Abdomen: Bowel sounds present, nontender, nondistended, no hepatosplenomegaly. Extremities: no edema or cyanosis. Skin: warm and dry, no rashes noted Neuro: alert, no obvious focal deficits Psych: Normal affect and mood  ASSESSMENT/PLAN:   No problem-specific Assessment & Plan notes found for this encounter.     [name], [place]    {    This will disappear when note is signed, click to select method of visit    :1}

## 2021-05-20 NOTE — Progress Notes (Signed)
Patient here today for BP check.      Last BP was on 05/07/2021 and was 151/88.  BP today is 124/73 with a pulse of 56.    Checked BP in right arm with regular adult cuff.    Symptoms present: none.    Patient reports taking BP medications as prescribed. Patient will have BMP drawn today. Will follow up with PCP as needed.   Talbot Grumbling, RN

## 2021-05-21 LAB — BASIC METABOLIC PANEL
BUN/Creatinine Ratio: 15 (ref 9–23)
BUN: 14 mg/dL (ref 6–24)
CO2: 25 mmol/L (ref 20–29)
Calcium: 9.6 mg/dL (ref 8.7–10.2)
Chloride: 104 mmol/L (ref 96–106)
Creatinine, Ser: 0.93 mg/dL (ref 0.57–1.00)
Glucose: 82 mg/dL (ref 70–99)
Potassium: 4 mmol/L (ref 3.5–5.2)
Sodium: 143 mmol/L (ref 134–144)
eGFR: 73 mL/min/{1.73_m2} (ref >59)

## 2021-05-29 ENCOUNTER — Other Ambulatory Visit: Payer: Self-pay | Admitting: Family Medicine

## 2021-06-08 ENCOUNTER — Encounter: Payer: Self-pay | Admitting: Gastroenterology

## 2021-06-08 ENCOUNTER — Ambulatory Visit (INDEPENDENT_AMBULATORY_CARE_PROVIDER_SITE_OTHER): Payer: Medicare Other | Admitting: Gastroenterology

## 2021-06-08 VITALS — BP 114/68 | HR 60 | Ht 64.0 in | Wt 157.2 lb

## 2021-06-08 DIAGNOSIS — Z79899 Other long term (current) drug therapy: Secondary | ICD-10-CM | POA: Diagnosis not present

## 2021-06-08 DIAGNOSIS — K5000 Crohn's disease of small intestine without complications: Secondary | ICD-10-CM | POA: Diagnosis not present

## 2021-06-08 MED ORDER — NA SULFATE-K SULFATE-MG SULF 17.5-3.13-1.6 GM/177ML PO SOLN: 1.0000 | ORAL | 0 refills | Status: DC

## 2021-06-08 NOTE — Progress Notes (Signed)
[facility] Progress Note  Chief Complaint: Crohn's ileitis  Subjective  History: From my last office note 06/16/2020: "Last seen 01/15/2020 for ileal Crohn's disease recurrent after prior resection. On Stelara every 4 weeks. Intolerant of azathioprine due to arthralgias. At most recent visit I recommend starting methotrexate 25 mg weekly, but she was reluctant to do that out of concern for the risks, and wanted to wait until repeat colonoscopy to assess disease activity. She missed an appointment with [place] in September.   Shayanna has generally been feeling well from a digestive standpoint.  She is taking her Stelara as scheduled every 4 weeks, in the company sends 1 injection pen to her on time, and she is scheduled to take it the day after tomorrow.   She denies injection site reactions arthralgias rash or painful eye redness.  She has nausea and abdominal cramps a few days prior to injections."  Patient was on Stelara every 6 weeks at that visit, then CBC, CMP and Stelara drug and antibody levels were ordered after that visit. __________________________  Janae Bridgeman was here to follow-up her Crohn's disease.  She reports having had personal matters going on much of the last year, so she was unable to follow-up until now.  Bowel habits stable, 2 or 3/day most of the time, sometimes abdominal bloating or cramps.  Rare nausea, no vomiting.  Appetite good and weight stable.  No reactions or side effects from Stelara injections. No arthralgias, joint swelling, peripheral edema, painful eye redness or rash  EGD and colonoscopy 05/2019 (Over 10 minutes late for today's visit)  ROS: Cardiovascular:  no chest pain Respiratory: no dyspnea Mood stable on medicine Remainder of systems negative except as above The patient's Past Medical, Family and Social History were reviewed and are on file in the EMR.  Objective:  Med list reviewed  Current Outpatient Medications:    amLODipine  (NORVASC) 10 MG tablet, TAKE 1 TABLET BY MOUTH EVERY DAY, Disp: 90 tablet, Rfl: 1   escitalopram (LEXAPRO) 5 MG tablet, TAKE 1 TABLET BY MOUTH EVERY DAY, Disp: 30 tablet, Rfl: 2   ferrous sulfate 325 (65 FE) MG tablet, Take 325 mg by mouth daily with breakfast., Disp: , Rfl:    Na Sulfate-K Sulfate-Mg Sulf (SUPREP BOWEL PREP KIT) 17.5-3.13-1.6 GM/177ML SOLN, Take 1 kit by mouth as directed. For colonoscopy prep, Disp: 354 mL, Rfl: 0   omeprazole (PRILOSEC) 40 MG capsule, TAKE 1 CAPSULE BY MOUTH EVERY DAY, Disp: 90 capsule, Rfl: 1   ondansetron (ZOFRAN) 8 MG tablet, Take 1 tablet (8 mg total) by mouth every 8 (eight) hours as needed for nausea or vomiting., Disp: 45 tablet, Rfl: 3   telmisartan (MICARDIS) 40 MG tablet, TAKE 1 TABLET BY MOUTH EVERYDAY AT BEDTIME, Disp: 90 tablet, Rfl: 2   ustekinumab (STELARA) 90 MG/ML SOSY injection, INJECT 1 SYRINGE  SUBCUTANEOUSLY EVERY 4  WEEKS, Disp: 1 mL, Rfl: 2   Vital signs in last 24 hrs: Vitals:   06/08/21 0933  BP: 114/68  Pulse: 60   Wt Readings from Last 3 Encounters:  06/08/21 157 lb 3.2 oz (71.3 kg)  05/07/21 159 lb 3.2 oz (72.2 kg)  01/05/21 154 lb (69.9 kg)    Physical Exam  Well-appearing HEENT: sclera anicteric, oral mucosa moist without lesions Neck: supple, no thyromegaly, JVD or lymphadenopathy Cardiac: RRR without murmurs, S1S2 heard, no peripheral edema Pulm: clear to auscultation bilaterally, normal RR and effort noted Abdomen: soft, no tenderness, with active bowel sounds.  No guarding or palpable hepatosplenomegaly. Skin; warm and dry, no jaundice or rash  Labs:  CBC Latest Ref Rng & Units 06/16/2020 01/02/2020 09/09/2019  WBC 4.0 - 10.5 K/uL 5.6 6.0 5.9  Hemoglobin 12.0 - 15.0 g/dL 12.5 13.0 12.9  Hematocrit 36.0 - 46.0 % 38.6 39.1 39.5  Platelets 150.0 - 400.0 K/uL 226.0 238 246.0   CMP Latest Ref Rng & Units 05/20/2021 05/07/2021 06/16/2020  Glucose 70 - 99 mg/dL 82 82 80  BUN 6 - 24 mg/dL 14 9 13   Creatinine 0.57 -  1.00 mg/dL 0.93 0.72 0.73  Sodium 134 - 144 mmol/L 143 142 143  Potassium 3.5 - 5.2 mmol/L 4.0 3.5 3.8  Chloride 96 - 106 mmol/L 104 102 104  CO2 20 - 29 mmol/L 25 25 35(H)  Calcium 8.7 - 10.2 mg/dL 9.6 9.2 9.2  Total Protein 6.0 - 8.3 g/dL - - 7.2  Total Bilirubin 0.2 - 1.2 mg/dL - - 0.3  Alkaline Phos 39 - 117 U/L - - 78  AST 0 - 37 U/L - - 14  ALT 0 - 35 U/L - - 9    ___________________________________________ Radiologic studies:   ____________________________________________ OtherDelsa Grana level 4.5, no antibodies in November 2021 (after that, patient's treatment was changed from every 6 weeks to every 4 weeks)  TPMT normal at 13 in November 2020 _____________________________________________ Assessment & Plan  Assessment: Encounter Diagnoses  Name Primary?   Crohn's disease of small intestine without complication ([place]) Yes   Long term current use of immunosuppressive drug     Symptoms relatively stable.  We discussed need for periodic colonoscopy to assess disease activity, which I recommend doing in the next couple of months.  She was agreeable after discussion of procedure and risks.  The benefits and risks of the planned procedure were described in detail with the patient or (when appropriate) their health care proxy.  Risks were outlined as including, but not limited to, bleeding, infection, perforation, adverse medication reaction leading to cardiac or pulmonary decompensation, pancreatitis (if ERCP).  The limitation of incomplete mucosal visualization was also discussed.  No guarantees or warranties were given.  Depending on that result, reconsideration of adding additional immunosuppressive medication, which she has been reluctant to do in the past.  Although she had arthralgias from azathioprine, a trial of 6-MP would be worth considering since it may have a better side effect profile.  This will depend on how active the Crohn's is and therefore I strongly combo  immunosuppressive therapy as needed, especially since she is now on weekly Stelara since earlier this year.  32 minutes were spent on this encounter (including chart review, history/exam, counseling/coordination of care, and documentation) > 50% of that time was spent on counseling and coordination of care.   [name]

## 2021-06-08 NOTE — Patient Instructions (Signed)
We have sent the following medications to your pharmacy for you to pick up at your convenience: [place] have been scheduled for a colonoscopy. Please follow written instructions given to you at your visit today.  Please pick up your prep supplies at the pharmacy within the next 1-3 days. If you use inhalers (even only as needed), please bring them with you on the day of your procedure.  If you are age 53 or younger, your body mass index should be between 19-25. Your Body mass index is 26.98 kg/m. If this is out of the aformentioned range listed, please consider follow up with your Primary Care Provider.   ________________________________________________________  The [facility] providers would like to encourage you to use [facility] to communicate with providers for non-urgent requests or questions.  Due to long hold times on the telephone, sending your provider a message by [facility] may be a faster and more efficient way to get a response.  Please allow 48 business hours for a response.  Please remember that this is for non-urgent requests.   Thank you for choosing me and [facility].  Dr.Henry Loletha Carrow

## 2021-06-16 ENCOUNTER — Encounter: Payer: Self-pay | Admitting: Podiatry

## 2021-06-16 ENCOUNTER — Ambulatory Visit (INDEPENDENT_AMBULATORY_CARE_PROVIDER_SITE_OTHER): Payer: Medicare Other | Admitting: Podiatry

## 2021-06-16 ENCOUNTER — Other Ambulatory Visit: Payer: Self-pay

## 2021-06-16 DIAGNOSIS — L84 Corns and callosities: Secondary | ICD-10-CM

## 2021-06-16 DIAGNOSIS — M7752 Other enthesopathy of left foot: Secondary | ICD-10-CM | POA: Diagnosis not present

## 2021-06-16 MED ORDER — TRIAMCINOLONE ACETONIDE 10 MG/ML IJ SUSP
10.0000 mg | Freq: Once | INTRAMUSCULAR | Status: AC
Start: 2021-06-16 — End: 2021-06-16
  Administered 2021-06-16: 10 mg

## 2021-06-16 NOTE — Progress Notes (Signed)
Subjective:   Patient ID: Marissa Blankenship, female   DOB: 55 y.o.   MRN: [id number]   HPI Patient states she has developed inflammation around the fifth metatarsal head left that is sore when pressed with lesion formation and inability to walk on this properly.  Did have it worked on several months ago which helped temporarily   ROS      Objective:  Physical Exam  Neurovascular status found to be intact with inflammation fluid around the fifth MPJ left painful when pressed along with keratotic lesion formation prominence of the metatarsal and     Assessment:  Inflammatory capsulitis fifth MPJ left with pain lesion formation and tailor's bunion deformity     Plan:  H&P reviewed condition and I did today do sterile prep and injected the fifth MPJ 2 mg Dexasone Kenalog 5 mg Xylocaine debrided lesion and discussed possible fifth metatarsal head resection with excision of plantar lesion.  Patient will be seen back when symptomatic and that will guide what else we may need to do

## 2021-06-17 ENCOUNTER — Telehealth: Payer: Self-pay

## 2021-06-17 MED ORDER — STELARA 90 MG/ML ~~LOC~~ SOSY: PREFILLED_SYRINGE | SUBCUTANEOUS | 3 refills | Status: DC

## 2021-06-17 NOTE — Telephone Encounter (Signed)
Received a fax from [employer] stating that they have attempted to reach patient several times to fill her Stelara. I called and spoke with patient, she states that she has not received any communication from the pharmacy. I have provided patient with the pharmacy phone number to contact them. I have also refilled patient's Stelara prescription. Pt verbalized understanding and had no concerns at the end of the call.

## 2021-07-18 ENCOUNTER — Other Ambulatory Visit: Payer: Self-pay | Admitting: Gastroenterology

## 2021-07-30 ENCOUNTER — Ambulatory Visit (AMBULATORY_SURGERY_CENTER): Payer: Medicare Other | Admitting: Gastroenterology

## 2021-07-30 ENCOUNTER — Encounter: Payer: Self-pay | Admitting: Gastroenterology

## 2021-07-30 ENCOUNTER — Other Ambulatory Visit: Payer: Self-pay

## 2021-07-30 ENCOUNTER — Other Ambulatory Visit (INDEPENDENT_AMBULATORY_CARE_PROVIDER_SITE_OTHER): Payer: Commercial Managed Care - HMO

## 2021-07-30 VITALS — BP 124/73 | HR 59 | Temp 98.0°F | Resp 13 | Ht 64.0 in | Wt 157.0 lb

## 2021-07-30 DIAGNOSIS — K621 Rectal polyp: Secondary | ICD-10-CM | POA: Diagnosis not present

## 2021-07-30 DIAGNOSIS — K5000 Crohn's disease of small intestine without complications: Secondary | ICD-10-CM

## 2021-07-30 DIAGNOSIS — D128 Benign neoplasm of rectum: Secondary | ICD-10-CM

## 2021-07-30 LAB — COMPREHENSIVE METABOLIC PANEL
ALT: 9 U/L (ref 0–35)
AST: 15 U/L (ref 0–37)
Albumin: 4.1 g/dL (ref 3.5–5.2)
Alkaline Phosphatase: 75 U/L (ref 39–117)
BUN: 8 mg/dL (ref 6–23)
CO2: 27 mEq/L (ref 19–32)
Calcium: 8.8 mg/dL (ref 8.4–10.5)
Chloride: 106 mEq/L (ref 96–112)
Creatinine, Ser: 0.74 mg/dL (ref 0.40–1.20)
GFR: 92.31 mL/min (ref >60.00)
Glucose, Bld: 64 mg/dL — ABNORMAL LOW (ref 70–99)
Potassium: 3.5 mEq/L (ref 3.5–5.1)
Sodium: 141 mEq/L (ref 135–145)
Total Bilirubin: 0.6 mg/dL (ref 0.2–1.2)
Total Protein: 6.8 g/dL (ref 6.0–8.3)

## 2021-07-30 LAB — CBC WITH DIFFERENTIAL/PLATELET
Basophils Absolute: 0 10*3/uL (ref 0.0–0.1)
Basophils Relative: 0.4 % (ref 0.0–3.0)
Eosinophils Absolute: 0.1 10*3/uL (ref 0.0–0.7)
Eosinophils Relative: 2 % (ref 0.0–5.0)
HCT: 38.4 % (ref 36.0–46.0)
Hemoglobin: 12.3 g/dL (ref 12.0–15.0)
Lymphocytes Relative: 27.4 % (ref 12.0–46.0)
Lymphs Abs: 1.6 10*3/uL (ref 0.7–4.0)
MCHC: 32 g/dL (ref 30.0–36.0)
MCV: 88.8 fl (ref 78.0–100.0)
Monocytes Absolute: 0.5 10*3/uL (ref 0.1–1.0)
Monocytes Relative: 7.6 % (ref 3.0–12.0)
Neutro Abs: 3.8 10*3/uL (ref 1.4–7.7)
Neutrophils Relative %: 62.6 % (ref 43.0–77.0)
Platelets: 206 10*3/uL (ref 150.0–400.0)
RBC: 4.33 Mil/uL (ref 3.87–5.11)
RDW: 14.9 % (ref 11.5–15.5)
WBC: 6 10*3/uL (ref 4.0–10.5)

## 2021-07-30 MED ORDER — SODIUM CHLORIDE 0.9 % IV SOLN: 500.0000 mL | Freq: Once | INTRAVENOUS | Status: DC

## 2021-07-30 NOTE — Progress Notes (Signed)
History and Physical:  This patient presents for endoscopic testing for: Encounter Diagnosis  Name Primary?   Crohn's disease of small intestine without complication ([place]) Yes    Last office visit 06/08/21 with clinical details. Crohn's small bowel with prior surgery, last colonoscopy 05/2019 On Stelara Q4 wks monotherapy (intolerant of AZA - arthralgias)  ROS: Patient denies chest pain or cough Chronic epigastric pain and bloating  Past Medical History: Past Medical History:  Diagnosis Date   Acid reflux    Allergy    Anemia    Anxiety    Crohn's disease (HCC)    Depression    Hypertension    IBS (irritable bowel syndrome)    spastic colon   Plantar fasciitis    PRE-ECLAMPSIA 06/18/2007   Qualifier: Diagnosis of  By: Amil Amen MD, Benard Rink of breath    Small bowel obstruction [facility])      Past Surgical History: Past Surgical History:  Procedure Laterality Date   CESAREAN SECTION     2   COLONOSCOPY     COLONOSCOPY WITH PROPOFOL N/A 12/02/2015   Procedure: COLONOSCOPY WITH PROPOFOL;  Surgeon: Doran Stabler, MD;  Location: [facility];  Service: Gastroenterology;  Laterality: N/A;   ESOPHAGOGASTRODUODENOSCOPY     LAPAROSCOPIC ILEOCECECTOMY N/A 01/31/2017   Procedure: LAPAROSCOPIC LYSIS OF ADHESIONS, ILEOCECECTOMY, STRICTUROPLASTY, BLADDER REPAIR, RIGHT SALPINGECTOMY;  Surgeon: Michael Boston, MD;  Location: [facility];  Service: General;  Laterality: N/A;   UPPER GASTROINTESTINAL ENDOSCOPY     WISDOM TOOTH EXTRACTION      Allergies: No Known Allergies  Outpatient Meds: Current Outpatient Medications  Medication Sig Dispense Refill   amLODipine (NORVASC) 10 MG tablet TAKE 1 TABLET BY MOUTH EVERY DAY 90 tablet 1   escitalopram (LEXAPRO) 5 MG tablet TAKE 1 TABLET BY MOUTH EVERY DAY 30 tablet 2   ferrous sulfate 325 (65 FE) MG tablet Take 325 mg by mouth daily with breakfast.     Na Sulfate-K Sulfate-Mg Sulf (SUPREP BOWEL PREP KIT) 17.5-3.13-1.6 GM/177ML  SOLN Take 1 kit by mouth as directed. For colonoscopy prep 354 mL 0   omeprazole (PRILOSEC) 40 MG capsule TAKE 1 CAPSULE BY MOUTH EVERY DAY 90 capsule 1   ondansetron (ZOFRAN) 8 MG tablet Take 1 tablet (8 mg total) by mouth every 8 (eight) hours as needed for nausea or vomiting. 45 tablet 3   telmisartan (MICARDIS) 40 MG tablet TAKE 1 TABLET BY MOUTH EVERYDAY AT BEDTIME 90 tablet 2   ustekinumab (STELARA) 90 MG/ML SOSY injection INJECT 1 SYRINGE  SUBCUTANEOUSLY EVERY 4  WEEKS 1 mL 3   Current Facility-Administered Medications  Medication Dose Route Frequency Provider Last Rate Last Admin   0.9 %  sodium chloride infusion  500 mL Intravenous Once Doran Stabler, MD          ___________________________________________________________________ Objective   Exam:  BP 120/71    Pulse 65    Temp 98 F (36.7 C)    Ht 5' 4"  (1.626 m)    Wt 157 lb (71.2 kg)    LMP 02/07/2017 (Approximate)    SpO2 100%    BMI 26.95 kg/m   CV: RRR without murmur, S1/S2 Resp: clear to auscultation bilaterally, normal RR and effort noted GI: soft, no tenderness, with active bowel sounds.   Assessment: Encounter Diagnosis  Name Primary?   Crohn's disease of small intestine without complication ([place]) Yes     Plan: Colonoscopy  The benefits and risks of the  planned procedure were described in detail with the patient or (when appropriate) their health care proxy.  Risks were outlined as including, but not limited to, bleeding, infection, perforation, adverse medication reaction leading to cardiac or pulmonary decompensation, pancreatitis (if ERCP).  The limitation of incomplete mucosal visualization was also discussed.  No guarantees or warranties were given.  CBC/diff and CMP today as well.   The patient is appropriate for an endoscopic procedure in the ambulatory setting.   - Wilfrid Lund, MD

## 2021-07-30 NOTE — Progress Notes (Signed)
VS by CW  Pt's states no medical or surgical changes since previsit or office visit.  

## 2021-07-30 NOTE — Progress Notes (Signed)
Called to room to assist during endoscopic procedure.  Patient ID and intended procedure confirmed with present staff. Received instructions for my participation in the procedure from the performing physician.  

## 2021-07-30 NOTE — Progress Notes (Signed)
VSS, Transported to PACU

## 2021-07-30 NOTE — Patient Instructions (Signed)
Resume previous diet and continue current medications. Awaiting pathology results. Repeat Colonoscopy recommended for surveillance, date to be determined based on pathology results.  Return to my office at appointment to be scheduled ([facility] 4 weeks) CBC/diff and CMP today  YOU HAD AN ENDOSCOPIC PROCEDURE TODAY AT [place]:   Refer to the procedure report that was given to you for any specific questions about what was found during the examination.  If the procedure report does not answer your questions, please call your gastroenterologist to clarify.  If you requested that your care partner not be given the details of your procedure findings, then the procedure report has been included in a sealed envelope for you to review at your convenience later.  YOU SHOULD EXPECT: Some feelings of bloating in the abdomen. Passage of more gas than usual.  Walking can help get rid of the air that was put into your GI tract during the procedure and reduce the bloating. If you had a lower endoscopy (such as a colonoscopy or flexible sigmoidoscopy) you may notice spotting of blood in your stool or on the toilet paper. If you underwent a bowel prep for your procedure, you may not have a normal bowel movement for a few days.  Please Note:  You might notice some irritation and congestion in your nose or some drainage.  This is from the oxygen used during your procedure.  There is no need for concern and it should clear up in a day or so.  SYMPTOMS TO REPORT IMMEDIATELY:  Following lower endoscopy (colonoscopy or flexible sigmoidoscopy):  Excessive amounts of blood in the stool  Significant tenderness or worsening of abdominal pains  Swelling of the abdomen that is new, acute  Fever of 100F or higher  For urgent or emergent issues, a gastroenterologist can be reached at any hour by calling [phone]. Do not use MyChart messaging for urgent concerns.    DIET:  We do recommend a small meal  at first, but then you may proceed to your regular diet.  Drink plenty of fluids but you should avoid alcoholic beverages for 24 hours.  ACTIVITY:  You should plan to take it easy for the rest of today and you should NOT DRIVE or use heavy machinery until tomorrow (because of the sedation medicines used during the test).    FOLLOW UP: Our staff will call the number listed on your records 48-72 hours following your procedure to check on you and address any questions or concerns that you may have regarding the information given to you following your procedure. If we do not reach you, we will leave a message.  We will attempt to reach you two times.  During this call, we will ask if you have developed any symptoms of COVID 19. If you develop any symptoms (ie: fever, flu-like symptoms, shortness of breath, cough etc.) before then, please call [phone].  If you test positive for Covid 19 in the 2 weeks post procedure, please call and report this information to [place].    If any biopsies were taken you will be contacted by phone or by letter within the next 1-3 weeks.  Please call us at [phone] if you have not heard about the biopsies in 3 weeks.    SIGNATURES/CONFIDENTIALITY: You and/or your care partner have signed paperwork which will be entered into your electronic medical record.  These signatures attest to the fact that that the information above on your After Visit Summary has  been reviewed and is understood.  Full responsibility of the confidentiality of this discharge information lies with you and/or your care-partner.

## 2021-07-30 NOTE — Op Note (Signed)
[facility] Patient Name: Marissa Blankenship Procedure Date: 07/30/2021 12:08 PM MRN: [id number] Endoscopist: [place]. Loletha Carrow , MD Age: 55 Referring MD:  Date of Birth: 1966-05-04 Gender: Female Account #: [id number] Procedure:                Colonoscopy Indications:              Disease activity assessment of Crohn's disease of                            the small bowel                           resection in 2019 (failed Humira pre-op)                           currently on Stelara Q 4 wks. intolerant of                            Azathioprine due to arthralgias Medicines:                Monitored Anesthesia Care Procedure:                Pre-Anesthesia Assessment:                           - Prior to the procedure, a History and Physical                            was performed, and patient medications and                            allergies were reviewed. The patient's tolerance of                            previous anesthesia was also reviewed. The risks                            and benefits of the procedure and the sedation                            options and risks were discussed with the patient.                            All questions were answered, and informed consent                            was obtained. Prior Anticoagulants: The patient has                            taken no previous anticoagulant or antiplatelet                            agents. ASA Grade Assessment: II - A patient with  mild systemic disease. After reviewing the risks                            and benefits, the patient was deemed in                            satisfactory condition to undergo the procedure.                           After obtaining informed consent, the colonoscope                            was passed under direct vision. Throughout the                            procedure, the patient's blood pressure, pulse, and                             oxygen saturations were monitored continuously. The                            CF [id number] #[id number] was introduced through the anus                            and advanced to the the neo-terminal ileum. The                            colonoscopy was somewhat difficult due to a                            redundant colon. Successful completion of the                            procedure was aided by using manual pressure and                            straightening and shortening the scope to obtain                            bowel loop reduction. The patient tolerated the                            procedure well. The quality of the bowel                            preparation was excellent. The neo-terminal ileum,                            the rectum and ileo-colonic anastomosis were                            photographed. The bowel preparation used was SUPREP. Scope In: 12:17:53 PM Scope Out: 12:31:34 PM Scope Withdrawal Time: 0 hours 10 minutes 37 seconds  Total Procedure Duration: 0 hours 13 minutes 41 seconds  Findings:                 The perianal and digital rectal examinations were                            normal. Specifically, no active peri-enal Crohn's                            disease.                           The neo-terminal ileum contained a few diminutive                            ulcers. No bleeding was present. No stigmata of                            recent bleeding were seen.                           There was evidence of a prior side-to-side                            ileo-colonic anastomosis in the distal ascending                            colon. This was patent and was characterized by                            ulceration and an intact staple line. The                            anastomosis was easily traversed. Overall, the                            appearance of the anastomotic ulceration was                            improved from the last exam.                            A 4 mm polyp was found in the distal rectum. The                            polyp was sessile. The polyp was removed with a                            cold snare. Resection and retrieval were complete.                           The exam was otherwise without abnormality on                            direct and retroflexion views. Complications:  No immediate complications. Estimated Blood Loss:     Estimated blood loss was minimal. Impression:               - A few ulcers in the neo-terminal ileum.                           - Patent side-to-side ileo-colonic anastomosis,                            characterized by ulceration and an intact staple                            line.                           - One 4 mm polyp in the distal rectum, removed with                            a cold snare. Resected and retrieved.                           - The examination was otherwise normal on direct                            and retroflexion views. Recommendation:           - Patient has a contact number available for                            emergencies. The signs and symptoms of potential                            delayed complications were discussed with the                            patient. Return to normal activities tomorrow.                            Written discharge instructions were provided to the                            patient.                           - Resume previous diet.                           - Continue present medications.                           - Await pathology results.                           - Repeat colonoscopy is recommended for                            surveillance. The colonoscopy date will  be                            determined after pathology results from today's                            exam become available for review.                           - Return to my office at appointment to be                             scheduled. (approx 4 weeks)                           - CBC/diff and CMP today (in anticipation of                            possible institution of 6-MP) Cadell Gabrielson L. Loletha Carrow, MD 07/30/2021 12:41:17 PM This report has been signed electronically.

## 2021-08-02 ENCOUNTER — Telehealth: Payer: Self-pay

## 2021-08-02 NOTE — Telephone Encounter (Signed)
Per 07/30/21 procedure report - Return to my office at appt to be scheduled (approx. 4 weeks).  Called and spoke with patient to set up her appt. She has been scheduled for a 4-week f/u with Dr. Loletha Carrow on Tuesday, 08/31/21 at 10:20 am. Pt verbalized understanding and had no concerns at the end of the call.

## 2021-08-03 ENCOUNTER — Telehealth: Payer: Self-pay

## 2021-08-03 NOTE — Telephone Encounter (Signed)
°  Follow up Call-  Call back number 07/30/2021 05/23/2019  Post procedure Call Back phone  # [phone]  Permission to leave phone message Yes Yes  Some recent data might be hidden     Patient questions:  Do you have a fever, pain , or abdominal swelling? No. Pain Score  0 *  Have you tolerated food without any problems? Yes.    Have you been able to return to your normal activities? Yes.    Do you have any questions about your discharge instructions: Diet   No. Medications  No. Follow up visit  No.  Do you have questions or concerns about your Care? No.  Actions: * If pain score is 4 or above: No action needed, pain <4.  Have you developed a fever since your procedure? no  2.   Have you had an respiratory symptoms (SOB or cough) since your procedure? no  3.   Have you tested positive for COVID 19 since your procedure no  4.   Have you had any family members/close contacts diagnosed with the COVID 19 since your procedure?  no   If yes to any of these questions please route to Joylene John, RN and Joella Prince, RN

## 2021-08-31 ENCOUNTER — Encounter: Payer: Self-pay | Admitting: Gastroenterology

## 2021-08-31 ENCOUNTER — Ambulatory Visit (INDEPENDENT_AMBULATORY_CARE_PROVIDER_SITE_OTHER): Payer: Medicare Other | Admitting: Gastroenterology

## 2021-08-31 VITALS — BP 130/72 | HR 73 | Ht 64.0 in | Wt 158.6 lb

## 2021-08-31 DIAGNOSIS — Z7960 Long term (current) use of unspecified immunomodulators and immunosuppressants: Secondary | ICD-10-CM

## 2021-08-31 DIAGNOSIS — Z79899 Other long term (current) drug therapy: Secondary | ICD-10-CM

## 2021-08-31 DIAGNOSIS — K5000 Crohn's disease of small intestine without complications: Secondary | ICD-10-CM

## 2021-08-31 NOTE — Progress Notes (Signed)
[facility] Progress Note  Chief Complaint: Small bowel Crohn's disease  Subjective  History: Marissa Blankenship follows up for her Crohn's disease.  Last seen in the office November 2022, on Stelara every 4 weeks.  Previously intolerant of azathioprine, she has not wanted to start methotrexate.  Colonoscopy last month with findings as follows: The perianal and digital rectal examinations were normal. Specifically, no active peri-enal Crohn's disease. Findings: - The neo-terminal ileum contained a few diminutive ulcers. No bleeding was present. No stigmata of recent bleeding were seen. - There was evidence of a prior side-to-side ileo-colonic anastomosis in the distal ascending colon. This was patent and was characterized by ulceration and an intact staple line. The anastomosis was easily traversed. Overall, the appearance of the anastomotic ulceration was improved from the last exam. - A 4 mm polyp was found in the distal rectum. The polyp was sessile. The polyp was removed with a cold snare. Resection and retrieval were complete. - The exam was otherwise without abnormality on direct and retroflexion views.  She had a CBC and CMP in anticipation of discussion to consider trying 6-MP (may have better arthralgias side effect profile than she experienced with azathioprine) _________________________  Marissa Blankenship is feeling generally well, albeit with some intermittent upper abdominal pain as before.  Occasional nausea, no vomiting, bowel habits stable, no bleeding.  (14 minutes late for today's visit) ROS: Cardiovascular:  no chest pain Respiratory: no dyspnea  The patient's Past Medical, Family and Social History were reviewed and are on file in the EMR.  Objective:  Med list reviewed  Current Outpatient Medications:    amLODipine (NORVASC) 10 MG tablet, TAKE 1 TABLET BY MOUTH EVERY DAY, Disp: 90 tablet, Rfl: 1   escitalopram (LEXAPRO) 5 MG tablet, TAKE 1 TABLET BY MOUTH EVERY DAY,  Disp: 30 tablet, Rfl: 2   ferrous sulfate 325 (65 FE) MG tablet, Take 325 mg by mouth daily with breakfast., Disp: , Rfl:    Na Sulfate-K Sulfate-Mg Sulf (SUPREP BOWEL PREP KIT) 17.5-3.13-1.6 GM/177ML SOLN, Take 1 kit by mouth as directed. For colonoscopy prep, Disp: 354 mL, Rfl: 0   omeprazole (PRILOSEC) 40 MG capsule, TAKE 1 CAPSULE BY MOUTH EVERY DAY, Disp: 90 capsule, Rfl: 1   ondansetron (ZOFRAN) 8 MG tablet, Take 1 tablet (8 mg total) by mouth every 8 (eight) hours as needed for nausea or vomiting., Disp: 45 tablet, Rfl: 3   telmisartan (MICARDIS) 40 MG tablet, TAKE 1 TABLET BY MOUTH EVERYDAY AT BEDTIME, Disp: 90 tablet, Rfl: 2   ustekinumab (STELARA) 90 MG/ML SOSY injection, INJECT 1 SYRINGE  SUBCUTANEOUSLY EVERY 4  WEEKS, Disp: 1 mL, Rfl: 3   Vital signs in last 24 hrs: Vitals:   08/31/21 1036  BP: 130/72  Pulse: 73  SpO2: 98%   Wt Readings from Last 3 Encounters:  08/31/21 158 lb 9.6 oz (71.9 kg)  07/30/21 157 lb (71.2 kg)  06/08/21 157 lb 3.2 oz (71.3 kg)    Physical Exam  Well-appearing, weight stable HEENT: sclera anicteric, oral mucosa moist without lesions Neck: supple, no thyromegaly, JVD or lymphadenopathy Cardiac: RRR without murmurs, S1S2 heard, no peripheral edema Pulm: clear to auscultation bilaterally, normal RR and effort noted Abdomen: soft, no tenderness, with active bowel sounds. No guarding or palpable hepatosplenomegaly. Skin; warm and dry, no jaundice or rash  Labs:  CBC Latest Ref Rng & Units 07/30/2021 06/16/2020 01/02/2020  WBC 4.0 - 10.5 K/uL 6.0 5.6 6.0  Hemoglobin 12.0 - 15.0 g/dL 12.3  12.5 13.0  Hematocrit 36.0 - 46.0 % 38.4 38.6 39.1  Platelets 150.0 - 400.0 K/uL 206.0 226.0 238   CMP Latest Ref Rng & Units 07/30/2021 05/20/2021 05/07/2021  Glucose 70 - 99 mg/dL 64(L) 82 82  BUN 6 - 23 mg/dL 8 14 9   Creatinine 0.40 - 1.20 mg/dL 0.74 0.93 0.72  Sodium 135 - 145 mEq/L 141 143 142  Potassium 3.5 - 5.1 mEq/L 3.5 4.0 3.5  Chloride 96 - 112 mEq/L  106 104 102  CO2 19 - 32 mEq/L 27 25 25   Calcium 8.4 - 10.5 mg/dL 8.8 9.6 9.2  Total Protein 6.0 - 8.3 g/dL 6.8 - -  Total Bilirubin 0.2 - 1.2 mg/dL 0.6 - -  Alkaline Phos 39 - 117 U/L 75 - -  AST 0 - 37 U/L 15 - -  ALT 0 - 35 U/L 9 - -    ___________________________________________ Radiologic studies:   ____________________________________________ Other:   _____________________________________________ Assessment & Plan  Assessment: Encounter Diagnoses  Name Primary?   Crohn's disease of small intestine without complication ([place]) Yes   Long term current use of immunosuppressive drug    Stable Crohn's disease with improved appearance at the anastomosis and neoterminal ileum on recent colonoscopy.  She is tolerating Stelara well with every 4-week dosing.  I recommended she try 6-MP as concomitant immunosuppressive therapy to keep Crohn's under control and decrease the chance of immunogenicity and loss of response to Stelara.  We discussed risks of immunosuppression, long-term risk of malignancy, alterations of blood counts and liver labs, arthralgias and pancreatitis.  As before, she is apprehensive and would like to consider it further.  I will therefore plan to see her in 4 months or sooner if needed  22 minutes were spent on this encounter (including chart review, history/exam, counseling/coordination of care, and documentation) > 50% of that time was spent on counseling and coordination of care.   [name]

## 2021-08-31 NOTE — Patient Instructions (Signed)
If you are age 54 or older, your body mass index should be between 23-30. Your Body mass index is 27.22 kg/m. If this is out of the aforementioned range listed, please consider follow up with your Primary Care Provider.  If you are age 25 or younger, your body mass index should be between 19-25. Your Body mass index is 27.22 kg/m. If this is out of the aformentioned range listed, please consider follow up with your Primary Care Provider.   ________________________________________________________  The [facility] providers would like to encourage you to use [facility] to communicate with providers for non-urgent requests or questions.  Due to long hold times on the telephone, sending your provider a message by [facility] may be a faster and more efficient way to get a response.  Please allow 48 business hours for a response.  Please remember that this is for non-urgent requests.  _______________________________________________________   It was a pleasure to see you today!  Thank you for trusting me with your gastrointestinal care!

## 2021-09-10 ENCOUNTER — Encounter: Payer: Self-pay | Admitting: Podiatry

## 2021-09-10 ENCOUNTER — Telehealth: Payer: Self-pay | Admitting: Urology

## 2021-09-10 ENCOUNTER — Other Ambulatory Visit: Payer: Self-pay

## 2021-09-10 ENCOUNTER — Ambulatory Visit (INDEPENDENT_AMBULATORY_CARE_PROVIDER_SITE_OTHER): Payer: Medicare Other | Admitting: Podiatry

## 2021-09-10 DIAGNOSIS — M216X2 Other acquired deformities of left foot: Secondary | ICD-10-CM

## 2021-09-10 NOTE — Telephone Encounter (Signed)
DOS - 09/21/21  METATARSAL HEAD RESECTION 5TH LEFT --- 87867 EXC BENIGN LESION LEFT --- 11426  [employer] EFFECTIVE DATE - 07/18/21  PLAN DEDUCTIBLE - $0.00 OUT OF POCKET - $8,300.00 W/ $8,257.99 REMAINING  COINSURANCE - 20% COPAY - $0.00    PER [employer] WEBSITE FOR CPT CODES 67209 AND 47096 Notification or Prior Authorization is not required for the requested services  Decision ID #:[id number]

## 2021-09-10 NOTE — Progress Notes (Signed)
Subjective:   Patient ID: Marissa Blankenship, female   DOB: 56 y.o.   MRN: [id number]   HPI Patient presents with pain in the fifth metatarsal head left and states she only got about 4 weeks of relief and this does not work for her.  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  Patient wants surgery and I allowed her to read consent form going over all alternative treatments complications associated with this and she is willing to accept risk signs consent form and is given all preoperative instructions.  She understands that total recovery can still take approximately 4 to 6 months but she should do well quickly and I did dispense air fracture walker that I want her to wear to both protect the plantar incision done and immobilize the foot and I want her getting used to this prior to procedure

## 2021-09-15 DIAGNOSIS — M1812 Unilateral primary osteoarthritis of first carpometacarpal joint, left hand: Secondary | ICD-10-CM | POA: Diagnosis not present

## 2021-09-15 DIAGNOSIS — M79642 Pain in left hand: Secondary | ICD-10-CM | POA: Diagnosis not present

## 2021-09-20 MED ORDER — HYDROCODONE-ACETAMINOPHEN 10-325 MG PO TABS: 1.0000 | ORAL_TABLET | Freq: Three times a day (TID) | ORAL | 0 refills | Status: AC | PRN

## 2021-09-20 NOTE — Addendum Note (Signed)
Addended by: Wallene Huh on: 09/20/2021 01:13 PM   Modules accepted: Orders

## 2021-09-21 ENCOUNTER — Encounter: Payer: Self-pay | Admitting: Podiatry

## 2021-09-21 DIAGNOSIS — M205X2 Other deformities of toe(s) (acquired), left foot: Secondary | ICD-10-CM | POA: Diagnosis not present

## 2021-09-21 DIAGNOSIS — D492 Neoplasm of unspecified behavior of bone, soft tissue, and skin: Secondary | ICD-10-CM | POA: Diagnosis not present

## 2021-09-21 DIAGNOSIS — D2372 Other benign neoplasm of skin of left lower limb, including hip: Secondary | ICD-10-CM | POA: Diagnosis not present

## 2021-09-21 DIAGNOSIS — M21542 Acquired clubfoot, left foot: Secondary | ICD-10-CM | POA: Diagnosis not present

## 2021-09-27 ENCOUNTER — Encounter: Payer: Medicare Other | Admitting: Podiatry

## 2021-09-29 ENCOUNTER — Ambulatory Visit (INDEPENDENT_AMBULATORY_CARE_PROVIDER_SITE_OTHER): Payer: Medicare Other

## 2021-09-29 ENCOUNTER — Other Ambulatory Visit: Payer: Self-pay

## 2021-09-29 ENCOUNTER — Ambulatory Visit (INDEPENDENT_AMBULATORY_CARE_PROVIDER_SITE_OTHER): Payer: Medicare Other | Admitting: Podiatry

## 2021-09-29 ENCOUNTER — Encounter: Payer: Self-pay | Admitting: Podiatry

## 2021-09-29 DIAGNOSIS — Z9889 Other specified postprocedural states: Secondary | ICD-10-CM

## 2021-09-29 DIAGNOSIS — M21542 Acquired clubfoot, left foot: Secondary | ICD-10-CM | POA: Diagnosis not present

## 2021-09-29 NOTE — Progress Notes (Signed)
Subjective:  ? ?Patient ID: Marissa Blankenship, female   DOB: 56 y.o.   MRN: [id number]  ? ?HPI ?Patient presents stating that she is doing very well with surgery very pleased  ? ? ?ROS ? ? ?   ?Objective:  ?Physical Exam  ?Neurovascular status intact negative Bevelyn Buckles' sign noted left foot healing well wound edges well coapted in the plantar metatarsal fifth metatarsal  ? ?   ?Assessment:  ?Doing well post metatarsal head resection plantar skin neoplasm excision ? ?   ?Plan:  ?Reviewed condition and reapplied sterile dressing continue with surgical shoe and boot usage and reappoint in 10 days for suture removal and then at that point we will not need to be seen again as this is a stable procedure.  Patient may return to soft shoe gear in the next 2 weeks after removal of stitches ? ?X-rays indicate that satisfactory resection of the head of the metatarsal has occurred with good alignment noted ?   ? ? ?

## 2021-10-08 ENCOUNTER — Ambulatory Visit (INDEPENDENT_AMBULATORY_CARE_PROVIDER_SITE_OTHER): Payer: Medicare Other

## 2021-10-08 ENCOUNTER — Other Ambulatory Visit: Payer: Self-pay

## 2021-10-08 DIAGNOSIS — Z9889 Other specified postprocedural states: Secondary | ICD-10-CM

## 2021-10-08 NOTE — Progress Notes (Signed)
Patient in office today for suture removal. Patient denies nausea, vomiting, fever and chills. Sutures were removed from left posterior lateral aspect of foot without complication. Advised patient to gradually work her way back into a soft shoe over the next 2 weeks and call the office with any question, comments, or concerns. Patient verbalized understanding.  ?

## 2021-10-13 ENCOUNTER — Other Ambulatory Visit: Payer: Self-pay | Admitting: Family Medicine

## 2021-10-15 DIAGNOSIS — M1812 Unilateral primary osteoarthritis of first carpometacarpal joint, left hand: Secondary | ICD-10-CM | POA: Diagnosis not present

## 2021-10-15 DIAGNOSIS — R52 Pain, unspecified: Secondary | ICD-10-CM | POA: Diagnosis not present

## 2021-11-12 ENCOUNTER — Telehealth: Payer: Self-pay

## 2021-11-12 MED ORDER — STELARA 90 MG/ML ~~LOC~~ SOSY
PREFILLED_SYRINGE | SUBCUTANEOUS | 3 refills | Status: DC
Start: 2021-11-12 — End: 2022-06-13

## 2021-11-12 NOTE — Telephone Encounter (Signed)
Received a fax from [employer] stating that they have not been able to reach pt to set up shipment of her Stelara. I called and spoke with patient she states that there must an issue on their end because she requests her refills through the app and just took her injection yesterday. I told pt that I will go ahead and send her in a refill for her next fill. Pt verbalized understanding and had no concerns at the end of the call. ?

## 2021-12-13 ENCOUNTER — Encounter (INDEPENDENT_AMBULATORY_CARE_PROVIDER_SITE_OTHER): Payer: Self-pay

## 2021-12-15 ENCOUNTER — Other Ambulatory Visit: Payer: Self-pay

## 2021-12-15 ENCOUNTER — Ambulatory Visit (INDEPENDENT_AMBULATORY_CARE_PROVIDER_SITE_OTHER): Payer: Medicare Other | Admitting: Family Medicine

## 2021-12-15 VITALS — BP 157/79 | HR 60 | Wt 159.8 lb

## 2021-12-15 DIAGNOSIS — I10 Essential (primary) hypertension: Secondary | ICD-10-CM

## 2021-12-15 DIAGNOSIS — R519 Headache, unspecified: Secondary | ICD-10-CM

## 2021-12-15 DIAGNOSIS — R0683 Snoring: Secondary | ICD-10-CM

## 2021-12-15 NOTE — Progress Notes (Signed)
    SUBJECTIVE:   CHIEF COMPLAINT / HPI:   Hypertension: Current medications include amlodipine 10 mg daily, telmisartan 40 mg daily. Has seen 295M-841L systolic at home but this was in the setting of her headaches. She has not checked them outside of this.   Headaches: Have been having them daily for about a week. She states she takes tylenol daily for these. She feels the headaches bilaterally in front of her head. No congestion or allergy symptoms. No photophobia and no change in nausea (she has some at baseline from Crohns).  She states that her headaches often happen first thing in the morning and that she feels fatigued.  She also states that she has been told she snores before.  PERTINENT  PMH / PSH: Hypertension  OBJECTIVE:   BP (!) 157/79   Pulse 60   Wt 159 lb 12.8 oz (72.5 kg)   LMP 02/07/2017 (Approximate)   SpO2 100%   BMI 27.43 kg/m     General: NAD, pleasant, able to participate in exam  HEENT: No nasal congestion present, no dental cavities or other potential causes of frontal headache pain seen. Respiratory: No respiratory distress, CTA bilaterally Cardiac: RRR Neuro: alert, CN II through XII intact, fine touch sensation intact in upper and lower extremities bilaterally, strength 5/5 in upper lower extremities bilaterally Psych: Normal affect and mood  ASSESSMENT/PLAN:   Hypertension BP today of 157/79, however patient does have a headache currently. Current meds include 40 mg daily, amlodipine 10 mg daily.  Her home blood pressure measurements have been in the 244W to 102V systolically but these have only been measured at the times where she has a headache.  Discussed with her that I would like for her to come back in about a week for blood pressure check and if she can check her blood pressures at home when she does not have a headache this will give [place] an idea of where the range.  At that time we can decide if we need additional medications.  Concern for sleep  apnea: Patient with a history of morning fatigue, morning headaches, and snoring.  She also has elevated blood pressure.  Given her symptoms I think it is of high likelihood that she has sleep apnea.  Discussed this with her and she is interested in pursuing a referral for testing.  We will place this today.  Headaches: Seem to be tension style headaches as they occur in the frontal aspect bilaterally with no associated photophobia or nausea.  She does have some baseline nausea due to history of Crohn's disease but states this has not been worsened at times with a headache.  She had normal neurologic exam today.  No red flag symptoms and states that she gets headaches like this on an occasional basis.  Her headaches are being treated by Tylenol but she has had them most days.  She will follow-up in 1 week or sooner if she develops any worsening symptoms and we are pursuing the referral for evaluation for sleep apnea.  [name], [place]

## 2021-12-15 NOTE — Patient Instructions (Signed)
For your headaches continue using Tylenol as needed.  If you have any worsening headaches or any change of symptoms please let us know immediately.  On should come back in 1 week for blood pressure check.  Please check your blood pressures at home when you do not have a headache if you are able to do so.  I am also placing referral to evaluate for sleep apnea.

## 2021-12-21 ENCOUNTER — Encounter: Payer: Self-pay | Admitting: *Deleted

## 2021-12-22 NOTE — Progress Notes (Deleted)
    SUBJECTIVE:   CHIEF COMPLAINT / HPI:   Hypertension: Previously saw me on 12/15/2021 with a blood pressure 157/79.  Her plan was for her to check her blood pressures at home and follow-up in 1 week for blood pressure recheck.  It was noted that her blood pressure did occur in the setting of a headache.  Her home medications include amlodipine 10 mg daily, telmisartan 40 mg daily.  Today she states***.  PERTINENT  PMH / PSH: Hypertension  OBJECTIVE:   LMP 02/07/2017 (Approximate)  . ***  General: NAD, pleasant, able to participate in exam Respiratory: No respiratory distress Neuro: alert, no obvious focal deficits Psych: Normal affect and mood  ASSESSMENT/PLAN:   No problem-specific Assessment & Plan notes found for this encounter.   BP today of ***. Current meds include ***  [name], DO [place]    {    This will disappear when note is signed, click to select method of visit    :1}

## 2021-12-23 ENCOUNTER — Ambulatory Visit: Payer: Medicare Other | Admitting: Family Medicine

## 2022-01-04 ENCOUNTER — Encounter: Payer: Self-pay | Admitting: Family Medicine

## 2022-01-04 ENCOUNTER — Ambulatory Visit (INDEPENDENT_AMBULATORY_CARE_PROVIDER_SITE_OTHER): Payer: Medicare Other | Admitting: Family Medicine

## 2022-01-04 VITALS — BP 140/86 | HR 80 | Ht 65.0 in | Wt 162.0 lb

## 2022-01-04 DIAGNOSIS — I10 Essential (primary) hypertension: Secondary | ICD-10-CM | POA: Diagnosis not present

## 2022-01-04 NOTE — Patient Instructions (Signed)
Your blood pressure is a little bit high today but because it was normal at home measurements I do not think we should increase your medicines.  I do want you to check your pressures from time to time and if you see numbers above 130 for the top number please let us know.

## 2022-01-04 NOTE — Progress Notes (Signed)
    SUBJECTIVE:   CHIEF COMPLAINT / HPI:   HTN: Previously seen by myself on 12/15/2021 with blood pressure elevated 157/79.  Her home blood pressure measurements have been in the 614J to 092H systolically but she is only measured this at times of headache.  Recommended her to check her blood pressures at home outside of headaches to see what her correct home resting blood pressure was.  Current meds include amlodipine 10 mg daily, telmisartan 40 mg daily. At home outside of headaches she has seen pressures of 128/88, 132/79.  PERTINENT  PMH / PSH: Hypertension  OBJECTIVE:   BP (!) 141/83   Pulse 80   Ht 5' 5"  (1.651 m)   Wt 162 lb (73.5 kg)   LMP 02/07/2017 (Approximate)   SpO2 100%   BMI 26.96 kg/m   140/86  General: NAD, pleasant, able to participate in exam Respiratory: No respiratory distress Neuro: alert, no obvious focal deficits Psych: Normal affect and mood  ASSESSMENT/PLAN:    Hypertension: BP today of 141/83.  At home blood pressures been 128/88, 132/79 when she checks outside of an instance of headache or other stressors.  Recommended continuing current medications.  Current meds include amlodipine 10 mg daily, telmisartan 40 mg daily.  Recommend she continue to monitor her pressure from time to time if she sees numbers above 574 systolic to let us know and make a follow-up appointment.  Next step would be to consider increasing telmisartan to 80 mg daily.  [name], [place]

## 2022-01-14 ENCOUNTER — Ambulatory Visit (INDEPENDENT_AMBULATORY_CARE_PROVIDER_SITE_OTHER): Payer: Medicare Other | Admitting: Pulmonary Disease

## 2022-01-14 ENCOUNTER — Encounter: Payer: Self-pay | Admitting: Pulmonary Disease

## 2022-01-14 VITALS — BP 124/76 | HR 73 | Temp 98.3°F | Ht 65.0 in | Wt 160.2 lb

## 2022-01-14 DIAGNOSIS — R0683 Snoring: Secondary | ICD-10-CM | POA: Diagnosis not present

## 2022-01-14 NOTE — Patient Instructions (Signed)
We will schedule you for home sleep test  Update your results as soon as reviewed  Weight loss efforts as able  Continue working on quitting smoking completely  I will see you back in about 3 months  Sleep Apnea Sleep apnea affects breathing during sleep. It causes breathing to stop for 10 seconds or more, or to become shallow. People with sleep apnea usually snore loudly. It can also increase the risk of: Heart attack. Stroke. Being very overweight (obese). Diabetes. Heart failure. Irregular heartbeat. High blood pressure. The goal of treatment is to help you breathe normally again. What are the causes?  The most common cause of this condition is a collapsed or blocked airway. There are three kinds of sleep apnea: Obstructive sleep apnea. This is caused by a blocked or collapsed airway. Central sleep apnea. This happens when the brain does not send the right signals to the muscles that control breathing. Mixed sleep apnea. This is a combination of obstructive and central sleep apnea. What increases the risk? Being overweight. Smoking. Having a small airway. Being older. Being female. Drinking alcohol. Taking medicines to calm yourself (sedatives or tranquilizers). Having family members with the condition. Having a tongue or tonsils that are larger than normal. What are the signs or symptoms? Trouble staying asleep. Loud snoring. Headaches in the morning. Waking up gasping. Dry mouth or sore throat in the morning. Being sleepy or tired during the day. If you are sleepy or tired during the day, you may also: Not be able to focus your mind (concentrate). Forget things. Get angry a lot and have mood swings. Feel sad (depressed). Have changes in your personality. Have less interest in sex, if you are female. Be unable to have an erection, if you are female. How is this treated?  Sleeping on your side. Using a medicine to get rid of mucus in your nose  (decongestant). Avoiding the use of alcohol, medicines to help you relax, or certain pain medicines (narcotics). Losing weight, if needed. Changing your diet. Quitting smoking. Using a machine to open your airway while you sleep, such as: An oral appliance. This is a mouthpiece that shifts your lower jaw forward. A CPAP device. This device blows air through a mask when you breathe out (exhale). An EPAP device. This has valves that you put in each nostril. A BIPAP device. This device blows air through a mask when you breathe in (inhale) and breathe out. Having surgery if other treatments do not work. Follow these instructions at home: Lifestyle Make changes that your doctor recommends. Eat a healthy diet. Lose weight if needed. Avoid alcohol, medicines to help you relax, and some pain medicines. Do not smoke or use any products that contain nicotine or tobacco. If you need help quitting, ask your doctor. General instructions Take over-the-counter and prescription medicines only as told by your doctor. If you were given a machine to use while you sleep, use it only as told by your doctor. If you are having surgery, make sure to tell your doctor you have sleep apnea. You may need to bring your device with you. Keep all follow-up visits. Contact a doctor if: The machine that you were given to use during sleep bothers you or does not seem to be working. You do not get better. You get worse. Get help right away if: Your chest hurts. You have trouble breathing in enough air. You have an uncomfortable feeling in your back, arms, or stomach. You have trouble talking. One  side of your body feels weak. A part of your face is hanging down. These symptoms may be an emergency. Get help right away. Call your local emergency services (911 in the [place] Do not wait to see if the symptoms will go away. Do not drive yourself to the hospital. Summary This condition affects breathing during  sleep. The most common cause is a collapsed or blocked airway. The goal of treatment is to help you breathe normally while you sleep. This information is not intended to replace advice given to you by your health care provider. Make sure you discuss any questions you have with your health care provider. Document Revised: 02/10/2021 Document Reviewed: 06/12/2020 Elsevier Patient Education  [place].

## 2022-01-14 NOTE — Progress Notes (Signed)
Marissa Blankenship    [id number]    24-Nov-1965  Primary Care Physician:Welborn, Thurmond Butts, DO  Referring Physician: Kinnie Feil, MD [address] [place],  [place] [address]  Chief complaint:   Patient being seen for nonrestorative sleep, multiple awakenings  HPI:  Sleep issues has been ongoing for years Has been told about snoring, denies history of witnessed apneas  No nasal stuffiness or congestion Does not wake up with dryness of her throat in the morning No night sweats no choking sensation  Usually able to wake up and get out of bed  Usually goes to bed between 9 PM and 11 Takes about 5 minutes to fall asleep 1-2 awakenings Final wake up time about 5 AM  She is an active smoker smokes about 3 cigarettes a day   Outpatient Encounter Medications as of 01/14/2022  Medication Sig   amLODipine (NORVASC) 10 MG tablet TAKE 1 TABLET BY MOUTH EVERY DAY   escitalopram (LEXAPRO) 5 MG tablet TAKE 1 TABLET BY MOUTH EVERY DAY   ferrous sulfate 325 (65 FE) MG tablet Take 1 tablet by mouth daily with breakfast.   Na Sulfate-K Sulfate-Mg Sulf (SUPREP BOWEL PREP KIT) 17.5-3.13-1.6 GM/177ML SOLN Take 1 kit by mouth as directed. For colonoscopy prep   omeprazole (PRILOSEC) 40 MG capsule TAKE 1 CAPSULE BY MOUTH EVERY DAY   ondansetron (ZOFRAN) 8 MG tablet Take 1 tablet (8 mg total) by mouth every 8 (eight) hours as needed for nausea or vomiting.   telmisartan (MICARDIS) 40 MG tablet Take by mouth.   ustekinumab (STELARA) 90 MG/ML SOSY injection INJECT 1 SYRINGE  SUBCUTANEOUSLY EVERY 4  WEEKS   No facility-administered encounter medications on file as of 01/14/2022.    Allergies as of 01/14/2022   (No Known Allergies)    Past Medical History:  Diagnosis Date   Acid reflux    Allergy    Anemia    Anxiety    Crohn's disease (HCC)    Depression    Hypertension    IBS (irritable bowel syndrome)    spastic colon   Plantar fasciitis    PRE-ECLAMPSIA 06/18/2007    Qualifier: Diagnosis of  By: Amil Amen MD, Benard Rink of breath    Small bowel obstruction [facility])     Past Surgical History:  Procedure Laterality Date   CESAREAN SECTION     2   COLONOSCOPY     COLONOSCOPY WITH PROPOFOL N/A 12/02/2015   Procedure: COLONOSCOPY WITH PROPOFOL;  Surgeon: Doran Stabler, MD;  Location: [facility] ENDOSCOPY;  Service: Gastroenterology;  Laterality: N/A;   ESOPHAGOGASTRODUODENOSCOPY     LAPAROSCOPIC ILEOCECECTOMY N/A 01/31/2017   Procedure: LAPAROSCOPIC LYSIS OF ADHESIONS, ILEOCECECTOMY, STRICTUROPLASTY, BLADDER REPAIR, RIGHT SALPINGECTOMY;  Surgeon: Michael Boston, MD;  Location: [facility] ORS;  Service: General;  Laterality: N/A;   UPPER GASTROINTESTINAL ENDOSCOPY     WISDOM TOOTH EXTRACTION      Family History  Problem Relation Age of Onset   Hypertension Father 23   Hypertension Mother 77   Crohn's disease Sister    Diabetes Paternal Grandmother    Hypertension Brother    Colon cancer Neg Hx    Pancreatic cancer Neg Hx    Stomach cancer Neg Hx    Esophageal cancer Neg Hx    Rectal cancer Neg Hx     Social History   Socioeconomic History   Marital status: Divorced    Spouse name: Not on  file   Number of children: 3   Years of education: Not on file   Highest education level: Associate degree: academic program  Occupational History   Occupation: disability   Tobacco Use   Smoking status: Some Days    Types: Cigarettes   Smokeless tobacco: Never   Tobacco comments:    3 ciggs per day 01/14/22 [facility]   Vaping Use   Vaping Use: Never used  Substance and Sexual Activity   Alcohol use: Yes    Comment: Occassionally    Drug use: No   Sexual activity: Yes    Partners: Male  Other Topics Concern   Not on file  Social History Narrative   Patient lives in [place] with her two sons.    Patient has a daughter who she is close with.    Patient enjoys walking for exercise.   Patient enjoys spending time with her grandchildren.       Social  Determinants of Health   Financial Resource Strain: Low Risk  (12/09/2020)   Overall Financial Resource Strain (CARDIA)    Difficulty of Paying Living Expenses: Not hard at all  Food Insecurity: No Food Insecurity (12/09/2020)   Hunger Vital Sign    Worried About Running Out of Food in the Last Year: Never true    Ran Out of Food in the Last Year: Never true  Transportation Needs: No Transportation Needs (12/09/2020)   PRAPARE - Hydrologist (Medical): No    Lack of Transportation (Non-Medical): No  Physical Activity: Inactive (12/09/2020)   Exercise Vital Sign    Days of Exercise per Week: 0 days    Minutes of Exercise per Session: 0 min  Stress: Stress Concern Present (12/09/2020)   [place]    Feeling of Stress : To some extent  Social Connections: Socially Isolated (12/09/2020)   Social Connection and Isolation Panel [NHANES]    Frequency of Communication with Friends and Family: More than three times a week    Frequency of Social Gatherings with Friends and Family: More than three times a week    Attends Religious Services: Never    Marine scientist or Organizations: No    Attends Archivist Meetings: Never    Marital Status: Divorced  Human resources officer Violence: Not At Risk (12/09/2020)   Humiliation, Afraid, Rape, and Kick questionnaire    Fear of Current or Ex-Partner: No    Emotionally Abused: No    Physically Abused: No    Sexually Abused: No    Review of Systems  Psychiatric/Behavioral:  Positive for sleep disturbance.     Vitals:   01/14/22 1134  BP: 124/76  Pulse: 73  Temp: 98.3 F (36.8 C)  SpO2: 99%     Physical Exam Constitutional:      Appearance: Normal appearance.  HENT:     Head: Normocephalic.     Nose: Nose normal.     Mouth/Throat:     Mouth: Mucous membranes are moist.     Comments: Macroglossia, Mallampati 3 Cardiovascular:      Rate and Rhythm: Normal rate and regular rhythm.     Heart sounds: No murmur heard.    No friction rub.  Pulmonary:     Effort: No respiratory distress.     Breath sounds: No stridor. No wheezing or rhonchi.  Musculoskeletal:     Cervical back: No rigidity or tenderness.  Neurological:  Mental Status: She is alert.  Psychiatric:        Mood and Affect: Mood normal.       01/14/2022   11:37 AM  Results of the Epworth flowsheet  Sitting and reading 1  Watching TV 2  Sitting, inactive in a public place (e.g. a theatre or a meeting) 1  As a passenger in a car for an hour without a break 0  Lying down to rest in the afternoon when circumstances permit 1  Sitting and talking to someone 0  Sitting quietly after a lunch without alcohol 1  In a car, while stopped for a few minutes in traffic 0  Total score 6     Data Reviewed: No previous sleep study on record  Assessment:  Moderate probability of significant obstructive sleep apnea  Nonrestorative sleep  Pathophysiology of sleep disordered breathing reviewed with the patient Treatment options for sleep disordered breathing reviewed with the patient  Plan/Recommendations: Schedule for home sleep study  Smoking cessation counseling  Tentative follow-up in about 3 months  Encouraged to call with significant concerns   Sherrilyn Rist MD [facility] 01/14/2022, 11:56 AM  CC: Kinnie Feil, MD

## 2022-02-07 ENCOUNTER — Encounter: Payer: Self-pay | Admitting: Gastroenterology

## 2022-02-17 ENCOUNTER — Other Ambulatory Visit: Payer: Self-pay | Admitting: Gastroenterology

## 2022-02-17 ENCOUNTER — Other Ambulatory Visit: Payer: Self-pay | Admitting: Family Medicine

## 2022-02-23 ENCOUNTER — Telehealth: Payer: Self-pay

## 2022-02-23 NOTE — Telephone Encounter (Signed)
Left patient a detailed vm that she is due for routine office visit with Dr. Loletha Carrow at this time. I advised pt to call back at her convenience to schedule.   I also received a fax from [facility] stating that they have attempted to reach patient several times regarding Stelara fill. I have received a similar fax before and patient stated that she fills her medication through the app.

## 2022-02-24 NOTE — Telephone Encounter (Signed)
Called and spoke with patient. She states that she refilled her Stelara this morning. Pt has been scheduled for a follow up appt with Dr. Loletha Carrow on Monday, 04/25/22 at 9:20 am. Pt verbalized understanding and had no concerns at the end of the call.

## 2022-02-25 ENCOUNTER — Ambulatory Visit (INDEPENDENT_AMBULATORY_CARE_PROVIDER_SITE_OTHER): Payer: Medicare Other

## 2022-02-25 ENCOUNTER — Encounter: Payer: Self-pay | Admitting: Podiatrist

## 2022-02-25 ENCOUNTER — Ambulatory Visit (INDEPENDENT_AMBULATORY_CARE_PROVIDER_SITE_OTHER): Payer: Medicare Other | Admitting: Podiatrist

## 2022-02-25 ENCOUNTER — Other Ambulatory Visit: Payer: Self-pay | Admitting: Podiatrist

## 2022-02-25 DIAGNOSIS — M7752 Other enthesopathy of left foot: Secondary | ICD-10-CM

## 2022-02-25 DIAGNOSIS — M792 Neuralgia and neuritis, unspecified: Secondary | ICD-10-CM | POA: Diagnosis not present

## 2022-02-25 DIAGNOSIS — M2141 Flat foot [pes planus] (acquired), right foot: Secondary | ICD-10-CM | POA: Diagnosis not present

## 2022-02-25 DIAGNOSIS — M778 Other enthesopathies, not elsewhere classified: Secondary | ICD-10-CM

## 2022-02-25 DIAGNOSIS — L84 Corns and callosities: Secondary | ICD-10-CM

## 2022-02-25 DIAGNOSIS — M2142 Flat foot [pes planus] (acquired), left foot: Secondary | ICD-10-CM

## 2022-02-25 NOTE — Patient Instructions (Signed)
Tarsal Tunnel Syndrome  Tarsal tunnel syndrome is a condition that happens when a nerve called the tibial nerve is irritated or squeezed (compressed) as it passes through an area on the inside of your ankle (tarsal tunnel). The tarsal tunnel is a narrow passage through the connective tissue and bones in your feet (tarsals). The tibial nerve passes behind the large bony bump at your inner ankle (medial malleolus) and sends branches to your foot and toes. This nerve enables feeling by passing signals to your heel, the bottom of your foot, and some of your toe muscles. Tarsal tunnel syndrome usually causes ankle and foot pain that gets worse with activity. What are the causes? Tarsal tunnel syndrome can be caused by any condition that narrows the space in the tarsal tunnel. Athletes may get tarsal tunnel syndrome from a fractured ankle or from an outward (eversion) ankle sprain that results in scarring or swelling. Other common causes include: Overpronation. This is when your feet roll inward and flatten too much when you stand, walk, or run. Extra pressure on the tarsal tunnel area from tight or stiff shoes or boots. Decreased room in the tarsal tunnel due to small, fluid-filled sacs (cysts) or growths on the bones near the tunnel (exostosis). What increases the risk? This condition is more likely to develop in people who: Play sports where they wear high, stiff boots, such as downhill skiing. Play sports with repetitive motion, such as running. Play sports on uneven surfaces that can lead to a sprained ankle, such as soccer or football. Have had an inner ankle injury. Have flat feet. Have other conditions, such as diabetes, hypothyroidism, or rheumatoid arthritis. What are the signs or symptoms? Symptoms of this condition include: Burning pain behind the ankle, in the heel, or in the foot. This pain gets worse if you are standing, walking, or running. Numbness or a prickling and tingling sensation  in your heel, foot, or toes. Swelling in your ankle or heel. At first, your symptoms may get worse with activity and be relieved with rest. Over time, your symptoms may become constant or come on sooner with less activity. How is this diagnosed? This condition is diagnosed based on: Your symptoms. Your medical history. A physical exam. Your health care provider may tap on the area below your ankle to check for tingling in your foot or toes. You may also have other tests, including: X-rays to check bone structure. An MRI or ultrasound to examine nerve and tendon structures and find where your nerve is getting compressed. A study of nerve function (electromyography or EMG). How is this treated? Treatment may include: Wearing a removable splint or boot for ankle support. Using a shoe insert (orthotic) to help support your arch. Taking NSAIDs to reduce pain. Using ice to reduce swelling. Having medicine injected into your ankle joint to reduce pain and swelling. Starting range-of-motion exercises and strengthening exercises. Gradually returning to full activity. The timing will depend on the severity of your condition and your response to treatment. Surgery. Follow these instructions at home: If you have a splint or boot: Wear the splint or boot as told by your health care provider. Remove it only as told by your health care provider. Loosen the splint or boot if your toes tingle, become numb, or turn cold and blue. Keep the splint or boot clean. If your splint or boot is not waterproof: Do not let it get wet. Cover it with a watertight covering when you take a bath or  shower. Ask your health care provider when it is safe to drive if your splint or boot is on a foot that you use for driving. Managing pain, stiffness, and swelling  If directed, put ice on the injured area. If you have a removable splint or boot, remove it as told by your health care provider. Put ice in a plastic  bag. Place a towel between your skin and the bag. Leave the ice on for 20 minutes, 2-3 times a day. Move your toes often to reduce stiffness and swelling. Raise (elevate) your foot above the level of your heart while you are sitting or lying down. Activity Return to your normal activities as told by your health care provider. Ask your health care provider what activities are safe for you. Do exercises as told by your health care provider. General instructions Take over-the-counter and prescription medicines only as told by your health care provider. Do not use any products that contain nicotine or tobacco, such as cigarettes, e-cigarettes, and chewing tobacco. These can delay healing. If you need help quitting, ask your health care provider. Keep all follow-up visits as told by your health care provider. This is important. How is this prevented? Give your body time to rest between periods of activity. Make sure to wear supportive and comfortable shoes during athletic activity. Do not overtighten ski boots or the laces on high-top shoes. Be safe and responsible while being active to avoid falls. Contact a health care provider if: Your ankle pain is not getting better. You are unable to support (bear) body weight on your foot without pain. Summary Tarsal tunnel syndrome is a condition that happens when a nerve called the tibial nerve is irritated or squeezed (compressed) as it passes through an area on the inside of your ankle (tarsal tunnel). Tarsal tunnel syndrome usually causes ankle and foot pain that gets worse with activity. This condition may be treated with a splint or boot, shoe inserts, ice, exercises, medicines, and surgery if needed. Follow your health care provider's instructions for caring for your splint or boot. Contact your health care provider if your ankle pain is not getting better, or if you are unable to bear your body weight without pain. This information is not  intended to replace advice given to you by your health care provider. Make sure you discuss any questions you have with your health care provider. Document Revised: 10/22/2020 Document Reviewed: 10/22/2020 Elsevier Patient Education  [place].

## 2022-02-25 NOTE — Progress Notes (Signed)
Chief Complaint  Patient presents with   Foot Pain    Rm 2 Left heel pain. PT states he is having constant burning pains with occasional shooting pains that radiates to achilles.      HPI: Patient is 56 y.o. female who presents today for a burning painful sensation on the plantar aspect of the left heel.  She states it is occasional shooting pains that radiate to her Achilles and plantar foot.  She denies any trauma or injury.  She also has small thickened lesions on the plantar aspect of bilateral feet which are uncomfortable.  Patient Active Problem List   Diagnosis Date Noted   Fatigue 01/02/2020   Bilateral hearing loss due to cerumen impaction 01/29/2019   B12 deficiency 07/06/2018   Anxiety and depression 04/16/2018   Crohn's disease with complication ([place]) 14/04/3012   Impacted cerumen of right ear 09/19/2017   Hyponatremia 02/02/2017   Exacerbation of Crohn's disease with intestinal obstruction status post ileal stricturoplasty and ileocecal resection 01/31/2017 01/30/2017   Anemia 01/30/2017   Protein-calorie malnutrition, severe 12/02/2015   Hypertrophy of uterus 09/14/2015   PLANTAR FASCIITIS, LEFT 05/25/2010   Essential hypertension 03/28/2007   ACID REFLUX DISEASE 03/28/2007    Current Outpatient Medications on File Prior to Visit  Medication Sig Dispense Refill   amLODipine (NORVASC) 10 MG tablet TAKE 1 TABLET BY MOUTH EVERY DAY 90 tablet 0   escitalopram (LEXAPRO) 5 MG tablet TAKE 1 TABLET BY MOUTH EVERY DAY 30 tablet 2   ferrous sulfate 325 (65 FE) MG tablet Take 1 tablet by mouth daily with breakfast.     Na Sulfate-K Sulfate-Mg Sulf (SUPREP BOWEL PREP KIT) 17.5-3.13-1.6 GM/177ML SOLN Take 1 kit by mouth as directed. For colonoscopy prep 354 mL 0   omeprazole (PRILOSEC) 40 MG capsule TAKE 1 CAPSULE BY MOUTH EVERY DAY 90 capsule 1   ondansetron (ZOFRAN) 8 MG tablet Take 1 tablet (8 mg total) by mouth every 8 (eight) hours as needed for nausea or vomiting. 45 tablet  3   telmisartan (MICARDIS) 40 MG tablet TAKE 1 TABLET BY MOUTH EVERYDAY AT BEDTIME 90 tablet 0   ustekinumab (STELARA) 90 MG/ML SOSY injection INJECT 1 SYRINGE  SUBCUTANEOUSLY EVERY 4  WEEKS 1 mL 3   No current facility-administered medications on file prior to visit.    No Known Allergies  Review of Systems No fevers, chills, nausea, muscle aches, no difficulty breathing, no calf pain, no chest pain or shortness of breath.   Physical Exam  GENERAL APPEARANCE: Alert, conversant. Appropriately groomed. No acute distress.   VASCULAR: Pedal pulses palpable 2/4 DP and PT bilateral.  Capillary refill time is immediate to all digits,  Proximal to distal cooling it warm to warm.  Digital perfusion adequate.   NEUROLOGIC: sensation is intact to 5.07 monofilament at 5/5 sites bilateral.  Light touch is intact bilateral, vibratory sensation intact bilateral  MUSCULOSKELETAL: acceptable muscle strength, tone and stability bilateral.  Pes planus foot type is noted bilateral.  Fifth metatarsal head resection of the left is noted.  Discomfort on the plantar aspect of the left heel along the tarsal tunnel region some burning is noted when tapping along the tarsal tunnel left.  DERMATOLOGIC: skin is warm, supple, and dry.  Color, texture, and turgor of skin within normal limits.  Surgical incision from the fifth metatarsal head resection on the left has completely healed.  Discrete porokeratotic lesions submetatarsal 2 right and submetatarsal 3 left are noted.  Groundglass appearance is noted  and intact integument postdebridement is seen.  X-ray evaluation 3 views of the right foot are obtained.  Inferior and posterior calcaneal spur is noted.  Pes planus deformity is also seen.  No acute osseous abnormalities are noted.  X-ray evaluation 3 views of the left foot are obtained.  Pes planus foot type is noted.  Fifth metatarsal head has been resected.  Small inferior calcaneal spur is noted.  No acute  osseous abnormalities are seen.  Assessment     ICD-10-CM   1. Pes planus of both feet  M21.41 CANCELED: DG Foot Complete Left   M21.42     2. Callus of foot  L84     3. Nerve pain  M79.2        Plan  Discussed exam findings and treatment recommendations.  Discussed that the pain in her heel appears to be from an nerve versus planter fasciitis.  She is just purchased new shoes with good support and states that her foot is feeling better therefore recommended she continue using these good supportive shoes and avoid any high heels or flat shoes.  Also discussed she could try some over-the-counter inserts from [employer] in her sneakers to help with the flat arches.  If conservative treatments fail discussed we could try an injection into the area in the future. I sharply debrided the porokeratotic lesions submetatarsal 2 right and submetatarsal 3 left with a 15 blade without complication.  Intact integument is noted postdebridement.  Patient was dispensed some pads to offload the area in her shoes that she can use. She will try conservative therapies and will call in the future if further treatment is requested.

## 2022-03-02 ENCOUNTER — Ambulatory Visit: Payer: Medicare Other

## 2022-03-02 DIAGNOSIS — G4733 Obstructive sleep apnea (adult) (pediatric): Secondary | ICD-10-CM | POA: Diagnosis not present

## 2022-03-02 DIAGNOSIS — R0683 Snoring: Secondary | ICD-10-CM

## 2022-03-23 ENCOUNTER — Telehealth: Payer: Self-pay | Admitting: Pulmonary Disease

## 2022-03-23 DIAGNOSIS — G4733 Obstructive sleep apnea (adult) (pediatric): Secondary | ICD-10-CM | POA: Diagnosis not present

## 2022-03-23 NOTE — Telephone Encounter (Signed)
Call patient  Sleep study result  Date of study: 03/02/2022  Impression: Mild obstructive sleep apnea Mild oxygen desaturations  Recommendation: Options of treatment for mild obstructive sleep apnea will include  1.  CPAP therapy if there is significant daytime sleepiness or other comorbidities including history of CVA or cardiac disease  -If CPAP is chosen as an option of treatment auto titrating CPAP with a pressure setting of 5-15 will be appropriate  2.  Watchful waiting with emphasis on weight loss measures, sleep position modification to optimize lateral sleep, elevating the head of the bed by about 30 degrees may also help.  3.  An oral device may be fashioned for the treatment of mild sleep disordered breathing, will involve referral to dentist.  Follow-up as previously scheduled

## 2022-03-24 NOTE — Telephone Encounter (Signed)
Patient has an OV coming up to go over results.

## 2022-03-31 NOTE — Telephone Encounter (Signed)
She can go over sleep study results at her next OV. Nothing further needed.

## 2022-04-11 ENCOUNTER — Ambulatory Visit (INDEPENDENT_AMBULATORY_CARE_PROVIDER_SITE_OTHER): Payer: Medicare Other

## 2022-04-11 DIAGNOSIS — Z23 Encounter for immunization: Secondary | ICD-10-CM

## 2022-04-11 NOTE — Progress Notes (Signed)
Patient presents to nurse clinic for flu vaccination. Administered in LD, site unremarkable, tolerated injection well.   Talbot Grumbling, RN

## 2022-04-18 ENCOUNTER — Ambulatory Visit: Payer: Medicare Other | Admitting: Pulmonary Disease

## 2022-04-20 ENCOUNTER — Ambulatory Visit (INDEPENDENT_AMBULATORY_CARE_PROVIDER_SITE_OTHER): Payer: Medicare Other | Admitting: Pulmonary Disease

## 2022-04-20 ENCOUNTER — Encounter: Payer: Self-pay | Admitting: Pulmonary Disease

## 2022-04-20 VITALS — BP 122/78 | HR 64 | Temp 98.5°F | Ht 65.0 in | Wt 164.6 lb

## 2022-04-20 DIAGNOSIS — G4733 Obstructive sleep apnea (adult) (pediatric): Secondary | ICD-10-CM

## 2022-04-20 NOTE — Patient Instructions (Signed)
Your sleep study did show mild obstructive sleep apnea  Options of treatment will include an oral device, CPAP therapy and some people just to choose to watch-monitor symptoms  You may also have associated insomnia that needs treated with medications -Continue melatonin as you are If you feel melatonin is not working, give [place] a call and we can try other agents  Discussed with your dentist about an oral device for obstructive sleep apnea  I will see you back in about 3 months  Call us with significant concerns

## 2022-04-20 NOTE — Progress Notes (Signed)
Marissa Blankenship    [id number]    02-Jul-1966  Primary Care Physician:Bronson, Hassell Done, MD  Referring Physician: Leslie Dales, MD [address] [place],  [place] [address]  Chief complaint:   Patient being seen for nonrestorative sleep, multiple awakenings Symptoms have been about the same  HPI: Recent sleep study shows mild obstructive sleep apnea with mild oxygen desaturations  No difficulty falling asleep, does have episodes of awakenings and sometimes unable to fall back asleep  Melatonin Gummies have been helping  Options of treatment for mild sleep apnea was discussed during the visit today  She still wakes up tired in the morning  No nasal stuffiness or congestion Does not wake up with dryness of her throat in the morning No night sweats no choking sensation  Usually able to wake up and get out of bed  Usually goes to bed between 9 PM and 11 Sometimes able to fall asleep in 5 minutes Multiple awakenings Final wake up time about 5 AM  She is an active smoker smokes about 3 cigarettes a day   Outpatient Encounter Medications as of 04/20/2022  Medication Sig   amLODipine (NORVASC) 10 MG tablet TAKE 1 TABLET BY MOUTH EVERY DAY   omeprazole (PRILOSEC) 40 MG capsule TAKE 1 CAPSULE BY MOUTH EVERY DAY   ondansetron (ZOFRAN) 8 MG tablet Take 1 tablet (8 mg total) by mouth every 8 (eight) hours as needed for nausea or vomiting.   telmisartan (MICARDIS) 40 MG tablet TAKE 1 TABLET BY MOUTH EVERYDAY AT BEDTIME   ustekinumab (STELARA) 90 MG/ML SOSY injection INJECT 1 SYRINGE  SUBCUTANEOUSLY EVERY 4  WEEKS   [DISCONTINUED] Na Sulfate-K Sulfate-Mg Sulf (SUPREP BOWEL PREP KIT) 17.5-3.13-1.6 GM/177ML SOLN Take 1 kit by mouth as directed. For colonoscopy prep   [DISCONTINUED] escitalopram (LEXAPRO) 5 MG tablet TAKE 1 TABLET BY MOUTH EVERY DAY (Patient not taking: Reported on 04/20/2022)   [DISCONTINUED] ferrous sulfate 325 (65 FE) MG tablet Take 1 tablet by mouth daily with  breakfast. (Patient not taking: Reported on 04/20/2022)   No facility-administered encounter medications on file as of 04/20/2022.    Allergies as of 04/20/2022   (No Known Allergies)    Past Medical History:  Diagnosis Date   Acid reflux    Allergy    Anemia    Anxiety    Crohn's disease (HCC)    Depression    Hypertension    IBS (irritable bowel syndrome)    spastic colon   Plantar fasciitis    PRE-ECLAMPSIA 06/18/2007   Qualifier: Diagnosis of  By: Amil Amen MD, Benard Rink of breath    Small bowel obstruction [facility])     Past Surgical History:  Procedure Laterality Date   CESAREAN SECTION     2   COLONOSCOPY     COLONOSCOPY WITH PROPOFOL N/A 12/02/2015   Procedure: COLONOSCOPY WITH PROPOFOL;  Surgeon: Doran Stabler, MD;  Location: [facility] ENDOSCOPY;  Service: Gastroenterology;  Laterality: N/A;   ESOPHAGOGASTRODUODENOSCOPY     LAPAROSCOPIC ILEOCECECTOMY N/A 01/31/2017   Procedure: LAPAROSCOPIC LYSIS OF ADHESIONS, ILEOCECECTOMY, STRICTUROPLASTY, BLADDER REPAIR, RIGHT SALPINGECTOMY;  Surgeon: Michael Boston, MD;  Location: [facility] ORS;  Service: General;  Laterality: N/A;   UPPER GASTROINTESTINAL ENDOSCOPY     WISDOM TOOTH EXTRACTION      Family History  Problem Relation Age of Onset   Hypertension Father 57   Hypertension Mother 70   Crohn's disease Sister  Diabetes Paternal Grandmother    Hypertension Brother    Colon cancer Neg Hx    Pancreatic cancer Neg Hx    Stomach cancer Neg Hx    Esophageal cancer Neg Hx    Rectal cancer Neg Hx     Social History   Socioeconomic History   Marital status: Divorced    Spouse name: Not on file   Number of children: 3   Years of education: Not on file   Highest education level: Associate degree: academic program  Occupational History   Occupation: disability   Tobacco Use   Smoking status: Former    Types: Cigarettes    Quit date: 04/13/2022    Years since quitting: 0.0   Smokeless tobacco: Never  Vaping Use    Vaping Use: Never used  Substance and Sexual Activity   Alcohol use: Yes    Comment: Occassionally    Drug use: No   Sexual activity: Yes    Partners: Male  Other Topics Concern   Not on file  Social History Narrative   Patient lives in [place] with her two sons.    Patient has a daughter who she is close with.    Patient enjoys walking for exercise.   Patient enjoys spending time with her grandchildren.       Social Determinants of Health   Financial Resource Strain: Low Risk  (12/09/2020)   Overall Financial Resource Strain (CARDIA)    Difficulty of Paying Living Expenses: Not hard at all  Food Insecurity: No Food Insecurity (12/09/2020)   Hunger Vital Sign    Worried About Running Out of Food in the Last Year: Never true    Ran Out of Food in the Last Year: Never true  Transportation Needs: No Transportation Needs (12/09/2020)   PRAPARE - Hydrologist (Medical): No    Lack of Transportation (Non-Medical): No  Physical Activity: Inactive (12/09/2020)   Exercise Vital Sign    Days of Exercise per Week: 0 days    Minutes of Exercise per Session: 0 min  Stress: Stress Concern Present (12/09/2020)   [place]    Feeling of Stress : To some extent  Social Connections: Socially Isolated (12/09/2020)   Social Connection and Isolation Panel [NHANES]    Frequency of Communication with Friends and Family: More than three times a week    Frequency of Social Gatherings with Friends and Family: More than three times a week    Attends Religious Services: Never    Marine scientist or Organizations: No    Attends Archivist Meetings: Never    Marital Status: Divorced  Human resources officer Violence: Not At Risk (12/09/2020)   Humiliation, Afraid, Rape, and Kick questionnaire    Fear of Current or Ex-Partner: No    Emotionally Abused: No    Physically Abused: No    Sexually  Abused: No    Review of Systems  Respiratory:  Positive for apnea.   Psychiatric/Behavioral:  Positive for sleep disturbance.     Vitals:   04/20/22 0918  BP: 122/78  Pulse: 64  Temp: 98.5 F (36.9 C)  SpO2: 99%     Physical Exam Constitutional:      Appearance: Normal appearance.  HENT:     Head: Normocephalic.     Nose: Nose normal.     Mouth/Throat:     Mouth: Mucous membranes are moist.     Comments:  Macroglossia, Mallampati 3 Cardiovascular:     Rate and Rhythm: Normal rate and regular rhythm.     Heart sounds: No murmur heard.    No friction rub.  Pulmonary:     Effort: No respiratory distress.     Breath sounds: No stridor. No wheezing or rhonchi.  Musculoskeletal:     Cervical back: No rigidity or tenderness.  Neurological:     Mental Status: She is alert.  Psychiatric:        Mood and Affect: Mood normal.      01/14/2022   11:37 AM  Results of the Epworth flowsheet  Sitting and reading 1  Watching TV 2  Sitting, inactive in a public place (e.g. a theatre or a meeting) 1  As a passenger in a car for an hour without a break 0  Lying down to rest in the afternoon when circumstances permit 1  Sitting and talking to someone 0  Sitting quietly after a lunch without alcohol 1  In a car, while stopped for a few minutes in traffic 0  Total score 6     Data Reviewed: Sleep study reviewed showing AHI of 7.2 Mild oxygen desaturations  Assessment:  Mild obstructive sleep apnea  Nonrestorative sleep  Treatment options for mild sleep apnea discussed with the patient  Sleep maintenance insomnia  Plan/Recommendations: Encouraged to talk with a dentist about an oral device for management of sleep apnea  Continue melatonin  Encouraged to give [place] a call if melatonin does not seem to be helping  Follow-up in 3 months  Sherrilyn Rist MD [facility] 04/20/2022, 9:24 AM  CC: Leslie Dales, MD

## 2022-04-25 ENCOUNTER — Other Ambulatory Visit (INDEPENDENT_AMBULATORY_CARE_PROVIDER_SITE_OTHER): Payer: Medicare Other

## 2022-04-25 ENCOUNTER — Encounter: Payer: Self-pay | Admitting: Gastroenterology

## 2022-04-25 ENCOUNTER — Ambulatory Visit (INDEPENDENT_AMBULATORY_CARE_PROVIDER_SITE_OTHER): Payer: Medicare Other | Admitting: Gastroenterology

## 2022-04-25 VITALS — BP 120/80 | HR 58 | Ht 64.0 in | Wt 166.1 lb

## 2022-04-25 DIAGNOSIS — R110 Nausea: Secondary | ICD-10-CM | POA: Diagnosis not present

## 2022-04-25 DIAGNOSIS — Z79899 Other long term (current) drug therapy: Secondary | ICD-10-CM

## 2022-04-25 DIAGNOSIS — K5000 Crohn's disease of small intestine without complications: Secondary | ICD-10-CM

## 2022-04-25 LAB — FOLATE: Folate: >23.9 ng/mL (ref >5.9)

## 2022-04-25 LAB — CBC WITH DIFFERENTIAL/PLATELET
Basophils Absolute: 0 10*3/uL (ref 0.0–0.1)
Basophils Relative: 0.6 % (ref 0.0–3.0)
Eosinophils Absolute: 0.1 10*3/uL (ref 0.0–0.7)
Eosinophils Relative: 2.1 % (ref 0.0–5.0)
HCT: 40.1 % (ref 36.0–46.0)
Hemoglobin: 13.1 g/dL (ref 12.0–15.0)
Lymphocytes Relative: 28.9 % (ref 12.0–46.0)
Lymphs Abs: 1.7 10*3/uL (ref 0.7–4.0)
MCHC: 32.7 g/dL (ref 30.0–36.0)
MCV: 88.3 fl (ref 78.0–100.0)
Monocytes Absolute: 0.4 10*3/uL (ref 0.1–1.0)
Monocytes Relative: 6.9 % (ref 3.0–12.0)
Neutro Abs: 3.6 10*3/uL (ref 1.4–7.7)
Neutrophils Relative %: 61.5 % (ref 43.0–77.0)
Platelets: 209 10*3/uL (ref 150.0–400.0)
RBC: 4.55 Mil/uL (ref 3.87–5.11)
RDW: 14.5 % (ref 11.5–15.5)
WBC: 5.8 10*3/uL (ref 4.0–10.5)

## 2022-04-25 LAB — COMPREHENSIVE METABOLIC PANEL
ALT: 10 U/L (ref 0–35)
AST: 16 U/L (ref 0–37)
Albumin: 4.3 g/dL (ref 3.5–5.2)
Alkaline Phosphatase: 82 U/L (ref 39–117)
BUN: 11 mg/dL (ref 6–23)
CO2: 29 mEq/L (ref 19–32)
Calcium: 9.5 mg/dL (ref 8.4–10.5)
Chloride: 105 mEq/L (ref 96–112)
Creatinine, Ser: 0.74 mg/dL (ref 0.40–1.20)
GFR: 91.83 mL/min (ref >60.00)
Glucose, Bld: 98 mg/dL (ref 70–99)
Potassium: 3.7 mEq/L (ref 3.5–5.1)
Sodium: 141 mEq/L (ref 135–145)
Total Bilirubin: 0.4 mg/dL (ref 0.2–1.2)
Total Protein: 7.5 g/dL (ref 6.0–8.3)

## 2022-04-25 LAB — IBC + FERRITIN
Ferritin: 57.8 ng/mL (ref 10.0–291.0)
Iron: 66 ug/dL (ref 42–145)
Saturation Ratios: 18.3 % — ABNORMAL LOW (ref 20.0–50.0)
TIBC: 359.8 ug/dL (ref 250.0–450.0)
Transferrin: 257 mg/dL (ref 212.0–360.0)

## 2022-04-25 LAB — HIGH SENSITIVITY CRP: CRP, High Sensitivity: 1.38 mg/L (ref 0.000–5.000)

## 2022-04-25 LAB — VITAMIN B12: Vitamin B-12: 394 pg/mL (ref 211–911)

## 2022-04-25 LAB — SEDIMENTATION RATE: Sed Rate: 27 mm/hr (ref 0–30)

## 2022-04-25 MED ORDER — ONDANSETRON HCL 8 MG PO TABS: 8.0000 mg | ORAL_TABLET | Freq: Three times a day (TID) | ORAL | 2 refills | Status: AC | PRN

## 2022-04-25 NOTE — Progress Notes (Signed)
[facility] Progress Note  Chief Complaint: Ileal Crohn's disease  Subjective  History: Marissa Blankenship was last seen in the office February 2023.  She has ileal Crohn's disease with ileocecectomy several years ago and now on Stelara every 4 weeks.  Last colonoscopy January 2023 showed significant improvement from the prior exam (Rutgeert's i1).  She was previously intolerant of azathioprine due to arthralgias and on a few occasions I have offered a trial of 6-MP or methotrexate but she has been reluctant to do so. Chronic nausea, EGD 05/2019, H pylori negative (CLO test off PPI). __________________   Marissa Blankenship is glad to report she is feeling well overall from the standpoint of her Crohn's. No diarrhea or rectal bleeding.  Appetite good and weight stable.  Intermittent nausea for which she was using as needed ondansetron until she ran out. Denies dysphagia or odynophagia.  She is up-to-date on vaccinations for pneumococcus, shingles and flu.  She plans to get the new COVID-vaccine soon. ROS: Cardiovascular:  no chest pain Respiratory: no dyspnea  The patient's Past Medical, Family and Social History were reviewed and are on file in the EMR.  Objective:  Med list reviewed  Current Outpatient Medications:    amLODipine (NORVASC) 10 MG tablet, TAKE 1 TABLET BY MOUTH EVERY DAY, Disp: 90 tablet, Rfl: 0   omeprazole (PRILOSEC) 40 MG capsule, TAKE 1 CAPSULE BY MOUTH EVERY DAY, Disp: 90 capsule, Rfl: 1   telmisartan (MICARDIS) 40 MG tablet, TAKE 1 TABLET BY MOUTH EVERYDAY AT BEDTIME, Disp: 90 tablet, Rfl: 0   ustekinumab (STELARA) 90 MG/ML SOSY injection, INJECT 1 SYRINGE  SUBCUTANEOUSLY EVERY 4  WEEKS, Disp: 1 mL, Rfl: 3   ondansetron (ZOFRAN) 8 MG tablet, Take 1 tablet (8 mg total) by mouth every 8 (eight) hours as needed for nausea or vomiting., Disp: 60 tablet, Rfl: 2   Vital signs in last 24 hrs: Vitals:   04/25/22 0924  BP: 120/80  Pulse: (!) 58   Wt Readings from Last 3  Encounters:  04/25/22 166 lb 2 oz (75.4 kg)  04/20/22 164 lb 9.6 oz (74.7 kg)  01/14/22 160 lb 3.2 oz (72.7 kg)    Physical Exam  Well-appearing HEENT: sclera anicteric, oral mucosa moist without lesions Neck: supple, no thyromegaly, JVD or lymphadenopathy Cardiac: Regular without murmur,  no peripheral edema Pulm: clear to auscultation bilaterally, normal RR and effort noted Abdomen: soft, no tenderness, with active bowel sounds. No guarding or palpable hepatosplenomegaly. Skin; warm and dry, no jaundice or rash  Labs:   ___________________________________________ Radiologic studies:   ____________________________________________ Other:   _____________________________________________ Assessment & Plan  Assessment: Encounter Diagnoses  Name Primary?   Crohn's disease of small intestine without complication ([place]) Yes   Long term current use of immunosuppressive drug    Nausea in adult     Chronic stable ileal Crohn's disease with prior surgery, currently on Stelara with good symptom control. Nausea with previous unrevealing endoscopic work-up, treated symptomatically.  Plan: ESR,CRP, fecal calprotectin, CBC, CMP, iron studies, B12, folate today If these are stable, plan 24-monthfollow-up with me.  We again discussed immunomodulators therapy, and I specifically recommended she consider mercaptopurine.  She again politely declines citing concern of additional immunosuppressive risks.  HNelida MeuseIII

## 2022-04-25 NOTE — Patient Instructions (Signed)
_______________________________________________________  If you are age 54 or older, your body mass index should be between 23-30. Your Body mass index is 28.52 kg/m. If this is out of the aforementioned range listed, please consider follow up with your Primary Care Provider.  If you are age 63 or younger, your body mass index should be between 19-25. Your Body mass index is 28.52 kg/m. If this is out of the aformentioned range listed, please consider follow up with your Primary Care Provider.   ________________________________________________________  The [place] [facility] providers would like to encourage you to use [facility] to communicate with providers for non-urgent requests or questions.  Due to long hold times on the telephone, sending your provider a message by [facility] may be a faster and more efficient way to get a response.  Please allow 48 business hours for a response.  Please remember that this is for non-urgent requests.  _______________________________________________________  Your provider has requested that you go to the basement level for lab work before leaving today. Press "B" on the elevator. The lab is located at the first door on the left as you exit the elevator.  Due to recent changes in healthcare laws, you may see the results of your imaging and laboratory studies on MyChart before your provider has had a chance to review them.  We understand that in some cases there may be results that are confusing or concerning to you. Not all laboratory results come back in the same time frame and the provider may be waiting for multiple results in order to interpret others.  Please give [place] 48 hours in order for your provider to thoroughly review all the results before contacting the office for clarification of your results.   It was a pleasure to see you today!  Thank you for trusting me with your gastrointestinal care!

## 2022-04-27 ENCOUNTER — Other Ambulatory Visit: Payer: Medicare Other

## 2022-04-27 DIAGNOSIS — K5000 Crohn's disease of small intestine without complications: Secondary | ICD-10-CM

## 2022-04-27 DIAGNOSIS — Z79899 Other long term (current) drug therapy: Secondary | ICD-10-CM | POA: Diagnosis not present

## 2022-04-27 DIAGNOSIS — R110 Nausea: Secondary | ICD-10-CM

## 2022-05-04 LAB — CALPROTECTIN, FECAL: Calprotectin, Fecal: 280 ug/g — ABNORMAL HIGH (ref 0–120)

## 2022-05-05 DIAGNOSIS — H04123 Dry eye syndrome of bilateral lacrimal glands: Secondary | ICD-10-CM | POA: Diagnosis not present

## 2022-05-05 DIAGNOSIS — H524 Presbyopia: Secondary | ICD-10-CM | POA: Diagnosis not present

## 2022-05-05 DIAGNOSIS — H35033 Hypertensive retinopathy, bilateral: Secondary | ICD-10-CM | POA: Diagnosis not present

## 2022-05-05 DIAGNOSIS — H2513 Age-related nuclear cataract, bilateral: Secondary | ICD-10-CM | POA: Diagnosis not present

## 2022-05-16 ENCOUNTER — Other Ambulatory Visit: Payer: Self-pay | Admitting: Family Medicine

## 2022-06-13 ENCOUNTER — Other Ambulatory Visit: Payer: Self-pay | Admitting: Gastroenterology

## 2022-06-25 ENCOUNTER — Other Ambulatory Visit: Payer: Self-pay | Admitting: Family Medicine

## 2022-07-20 ENCOUNTER — Telehealth: Payer: Self-pay | Admitting: Pulmonary Disease

## 2022-07-20 NOTE — Telephone Encounter (Signed)
Spoke with patient, she states her Dentist does not do oral appliances. I advised we usually refer out to Dr. Redmond Baseman or Dr. Ron Parker. She was unsure if her insurance would cover the visit. I advised she could call both places to see if they accept her insurance and she could call us back to move forward with the referral. She verbalized understanding. Will keep encounter open until patient calls back.

## 2022-07-26 NOTE — Telephone Encounter (Signed)
Called the pt and there was no answer- LMTCB    

## 2022-08-10 ENCOUNTER — Encounter: Payer: Self-pay | Admitting: Gastroenterology

## 2022-08-11 ENCOUNTER — Encounter: Payer: Self-pay | Admitting: Gastroenterology

## 2022-08-12 ENCOUNTER — Telehealth: Payer: Self-pay | Admitting: *Deleted

## 2022-08-12 ENCOUNTER — Ambulatory Visit (INDEPENDENT_AMBULATORY_CARE_PROVIDER_SITE_OTHER): Payer: 59 | Admitting: Student

## 2022-08-12 ENCOUNTER — Encounter: Payer: Self-pay | Admitting: Student

## 2022-08-12 VITALS — BP 136/80 | HR 60 | Ht 64.0 in | Wt 166.0 lb

## 2022-08-12 DIAGNOSIS — R519 Headache, unspecified: Secondary | ICD-10-CM | POA: Diagnosis not present

## 2022-08-12 DIAGNOSIS — Z59819 Housing instability, housed unspecified: Secondary | ICD-10-CM

## 2022-08-12 MED ORDER — PROCHLORPERAZINE MALEATE 10 MG PO TABS: 10.0000 mg | ORAL_TABLET | Freq: Four times a day (QID) | ORAL | 0 refills | Status: AC | PRN

## 2022-08-12 NOTE — Telephone Encounter (Signed)
   Telephone encounter was:  Successful.  08/12/2022 Name: Marissa Blankenship MRN: [id number] DOB: [birth year]  Carlean Jews is a 57 y.o. year old female who is a primary care patient of Leslie Dales, DO . The community resource team was consulted for assistance with  Housing   Care guide performed the following interventions: Patient provided with information about care guide support team and interviewed to confirm resource needs. On voucher program , housing is waiting on approving rent increase and is anxious that the increase will be to high for them to approve .  Follow Up Plan:  No further follow up planned at this time. The patient has been provided with needed resources.  [place] [phone] , [place] [address] Email : [email or web address]

## 2022-08-12 NOTE — Progress Notes (Signed)
    SUBJECTIVE:   CHIEF COMPLAINT / HPI:   Patient is a 57 y.o. year old female presenting with headache   Onset: Last week with weather changing Location: mid head to the front  Quality: throbbing Frequency: daily and comes on after Tylenol wears off Precipitating factors: None  Prior treatment: Tylenol    Associated Symptoms Nausea/vomiting: Nausea no vomiting Photophobia/phonophobia: No Tearing of eyes:No Sinus pain/pressure: No  Family hx migraine: brother  Personal stressors: finances and housing. Getting assistance so far with section 8, rent increasing and son in academic probation.  Relation to menstrual cycle: No   Red Flags Fever: No Neck pain/stiffness: No Vision/speech/swallow/hearing difficulty: No Focal weakness/numbness: No Altered mental status: No Trauma:  No   PERTINENT  PMH / PSH: Reviewed   OBJECTIVE:   BP (!) 142/70   Pulse 60   Ht '5\' 4"'$  (1.626 m)   Wt 166 lb (75.3 kg)   LMP 02/07/2017 (Approximate)   SpO2 98%   BMI 28.49 kg/m    Physical Exam General: Alert, well appearing, NAD Cardiovascular: RRR, No Murmurs, Normal S2/S2 Respiratory: CTAB, No wheezing or Rales Abdomen: No distension or tenderness Neuro: No focal neuro deficit, cranial nerves II to XII intact. Extremities: muscle strength 5/5 in all extremities with sensation intact.   ASSESSMENT/PLAN:   Headache Patient with report of 1 week of headache with no photophobia/phonophobia or red flag symptoms. Neurologic exam was normal.  Visitation and exam findings are consistent with tension headaches likely triggered by new stressors in her life which includes some insecurities and personal family situation. -Rx Compazine -Discussed  follow-up in 1 week if no improvement to obtain imaging -Reviewed return precaution with patient  Housing insecurity And currently on section 8 housing and is concerned might be going On Might Be Unable to [place] [facility] Referral  to [place] to Help with Any Resources That Could Benefit Patient.  Alen Bleacher, MD [place]

## 2022-08-12 NOTE — Patient Instructions (Addendum)
It was wonderful to meet you today. Thank you for allowing me to be a part of your care. Below is a short summary of what we discussed at your visit today:  Your headache is consistent with tension headache.  Have sent in prescription for Compazine which you will take as needed for headache.  Medication should also help with the nausea that you are experiencing.  Sent in referral to community care team to see if we can provide any assistance or resources especially with housing.  Please if you start noticing any extremity weakness, numbness neck stiffness any fevers or confusion please go to the ED or schedule to be seen  Please bring all of your medications to every appointment!  If you have any questions or concerns, please do not hesitate to contact us via phone or MyChart message.   Alen Bleacher, MD [facility]

## 2022-08-15 NOTE — Progress Notes (Signed)
  Care Coordination   Note   1/29/2024Late Entry 08/12/22 Name: RIELY OETKEN MRN: [id number] DOB: 20-Dec-1965  Carlean Jews is a 57 y.o. year old female who sees Leslie Dales, [place] for primary care. I reached out to Carlean Jews by phone today to offer care coordination services.  Ms. Suleiman was given information about Care Coordination services today including:   The Care Coordination services include support from the care team which includes your Nurse Coordinator, Clinical Social Worker, or Pharmacist.  The Care Coordination team is here to help remove barriers to the health concerns and goals most important to you. Care Coordination services are voluntary, and the patient may decline or stop services at any time by request to their care team member.   Care Coordination Consent Status: Patient agreed to services and verbal consent obtained.   Follow up plan:  Telephone appointment with care coordination team member scheduled for:  08/17/22  Encounter Outcome:  Pt. Scheduled  [place]  Direct Dial: [phone]

## 2022-08-17 ENCOUNTER — Other Ambulatory Visit: Payer: Self-pay | Admitting: Student

## 2022-08-17 ENCOUNTER — Ambulatory Visit: Payer: Self-pay | Admitting: Licensed Clinical Social Worker

## 2022-08-17 ENCOUNTER — Other Ambulatory Visit: Payer: Self-pay | Admitting: Gastroenterology

## 2022-08-17 NOTE — Patient Outreach (Signed)
  Care Coordination   Initial Visit Note   08/17/2022 Name: Marissa Blankenship MRN: [id number] DOB: 22-May-1966  Marissa Blankenship is a 57 y.o. year old female who sees Leslie Dales, [place] for primary care. I spoke with  Marissa Blankenship by phone today.  What matters to the patients health and wellness today? Patient wants to reside at same residence for next year. She is applying to re-qualify for Section 8 housing support for coming year.    Goals Addressed               This Visit's Progress     patient wants to reside at same residence for next year. she is applying to requalify for Section 8 housing support (pt-stated)        Interventions: Patient wants to reside at same residence for next year. She is completing application to re-qualify for Section 8 support. She wants to complete application and see if she is approved for next year to remain with Section 8 support at current residence Discussed program support with client. Discussed LCSW support , RN support, Pharmacy support Reviewed pain issues of client. She said she is doing well in pain management. Discussed food intake, meal preparation. She said she had no food needs at present Spoke with client about transport needs. She is driving her car as needed to appointments and to complete errands Discussed medication procurement. Discussed family support. She has support from her mother and from friends Reviewed mood of client. She said she gets a little anxious occasionally regarding housing situation . She likes to walk for exercise. She likes to listen to music to help her relax Spoke with client about PCP support. Provided counseling support for client Client appreciative of call today from LCSW to address her current needs      SDOH assessments and interventions completed:  Yes  SDOH Interventions Today    Flowsheet Row Most Recent Value  SDOH Interventions   Depression Interventions/Treatment  Counseling  Physical Activity  Interventions Other (Comments)  [may be somewhat inactive with physical activity.]  Stress Interventions Provide Counseling  [client has stress related to managing housing needs. She may have some financial challenges]        Care Coordination Interventions:  Yes, provided   Follow up plan: Follow up call scheduled for 09/20/22 at 1:00 PM     Encounter Outcome:  Pt. Visit Completed   Norva Riffle.Gudelia Eugene MSW, [place] [facility] representative [facility] Care Management [phone]

## 2022-08-17 NOTE — Patient Instructions (Signed)
Visit Information  Thank you for taking time to visit with me today. Please don't hesitate to contact me if I can be of assistance to you before our next scheduled telephone appointment.  Following are the goals we discussed today:    Our next appointment is by telephone on 09/20/22 at 1:00 PM  Please call the care guide team at [phone] if you need to cancel or reschedule your appointment.   If you are experiencing a Mental Health or [place] or need someone to talk to, please go to [facility] [place] [phone])   Following is a copy of your full plan of care:   Interventions: Patient wants to reside at same residence for next year. She is completing application to re-qualify for Section 8 support. She wants to complete application and see if she is approved for next year to remain with Section 8 support at current residence Discussed program support with client. Discussed LCSW support , RN support, Pharmacy support Reviewed pain issues of client. She said she is doing well in pain management. Discussed food intake, meal preparation. She said she had no food needs at present Spoke with client about transport needs. She is driving her car as needed to appointments and to complete errands Discussed medication procurement. Discussed family support. She has support from her mother and from friends Reviewed mood of client. She said she gets a little anxious occasionally regarding housing situation . She likes to walk for exercise. She likes to listen to music to help her relax Spoke with client about PCP support. Provided counseling support for client Client appreciative of call today from LCSW to address her current needs  Marissa Blankenship was given information about Care Management services by the embedded care coordination team including:  Care Management services include personalized support from designated clinical  staff supervised by her physician, including individualized plan of care and coordination with other care providers 24/7 contact phone numbers for assistance for urgent and routine care needs. The patient may stop CCM services at any time (effective at the end of the month) by phone call to the office staff.  Patient agreed to services and verbal consent obtained.   Marissa Blankenship.Marissa Blankenship MSW, [place] Holiday representative [facility] Care Management [phone]

## 2022-09-08 ENCOUNTER — Ambulatory Visit (INDEPENDENT_AMBULATORY_CARE_PROVIDER_SITE_OTHER): Payer: 59

## 2022-09-08 DIAGNOSIS — Z23 Encounter for immunization: Secondary | ICD-10-CM | POA: Diagnosis not present

## 2022-09-08 NOTE — Progress Notes (Signed)
Patient presents to nurse clinic for [place] vaccination. Administered in LD, site unremarkable, tolerated injection well.   Talbot Grumbling, RN

## 2022-09-20 ENCOUNTER — Ambulatory Visit: Payer: Self-pay | Admitting: Licensed Clinical Social Worker

## 2022-09-20 ENCOUNTER — Other Ambulatory Visit: Payer: Self-pay | Admitting: Gastroenterology

## 2022-09-20 NOTE — Patient Instructions (Signed)
Visit Information  Thank you for taking time to visit with me today. Please don't hesitate to contact me if I can be of assistance to you before our next scheduled telephone appointment.  Following are the goals we discussed today:    Our next appointment is by telephone on 10/25/22 at 3:00 PM   Please call the care guide team at [phone] if you need to cancel or reschedule your appointment.   If you are experiencing a Mental Health or [place] or need someone to talk to, please go to [facility] [place] [phone])   Following is a copy of your full plan of care:   Interventions:  LCSW talked with client today about her current needs. Client said she was obtaining her prescribed medications Discussed transport needs. Client said she is driving her car as needed to appointments and to complete errands Reviewed pain issues. Client said she had no serious pain issues.  Reviewed relaxation techniques of client. Client said she likes to walk for exercise and also enjoys listening to music Client reported that she had been busy today since it was election day. 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 LCSW thanked client for phone call with LCSW. LCSW to call client on 10/25/22 at 3:00  PM  Ms. Stong was given information about Care Management services by the embedded care coordination team including:  Care Management services include personalized support from designated clinical staff supervised by her physician, including individualized plan of care and coordination with other care providers 24/7 contact phone numbers for assistance for urgent and routine care needs. The patient may stop CCM services at any time (effective at the end of the month) by phone call to the office staff.  Patient agreed to services and verbal consent obtained.   Norva Riffle.Pernella Ackerley MSW, [place] Artist [facility] Care Management [phone]

## 2022-09-20 NOTE — Patient Outreach (Signed)
  Care Coordination   Follow Up Visit Note   09/20/2022 Name: Marissa Blankenship MRN: [id number] DOB: 02/18/1966  Marissa Blankenship is a 57 y.o. year old female who sees Leslie Dales, [place] for primary care. I spoke with  Marissa Blankenship by phone today.  What matters to the patients health and wellness today? Patient wants to reside at same residence for next  year. She is applying to re-qualify for Section 8 housing support    Goals Addressed               This Visit's Progress     patient wants to reside at same residence for next year. she is applying to requalify for Section 8 housing support (pt-stated)        Interventions:  LCSW talked with client today about her current needs. Client said she was obtaining her prescribed medications Discussed transport needs. Client said she is driving her car as needed to appointments and to complete errands Reviewed pain issues. Client said she had no serious pain issues.  Reviewed relaxation techniques of client. Client said she likes to walk for exercise and also enjoys listening to music Client reported that she had been busy today since it was election day. 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 LCSW thanked client for phone call with LCSW. LCSW to call client on 10/25/22 at 3:00  PM     SDOH assessments and interventions completed:  Yes    Care Coordination Interventions:  Yes, provided   Interventions Today    Flowsheet Row Most Recent Value  Chronic Disease   Chronic disease during today's visit Other  [spoke with client about her current needs]  General Interventions   General Interventions Discussed/Reviewed General Interventions Discussed, [employer]  [discussed program support]  Education Interventions   Education Provided Provided Education  Provided Orthoptist On [employer]       Follow up plan: Follow up call scheduled for 10/25/22 at 3:00 PM     Encounter Outcome:   Pt. Visit Completed   Norva Riffle.Sharah Finnell MSW, [place] Holiday representative [facility] Care Management [phone]

## 2022-09-27 ENCOUNTER — Telehealth: Payer: Self-pay | Admitting: Student

## 2022-09-27 NOTE — Telephone Encounter (Signed)
Contacted Marissa Blankenship to schedule their annual wellness visit. Appointment made for 10/06/2022.  Thank you,  [place] Direct dial  [phone]

## 2022-09-28 ENCOUNTER — Other Ambulatory Visit: Payer: Self-pay | Admitting: Student

## 2022-10-05 NOTE — Patient Instructions (Signed)

## 2022-10-05 NOTE — Progress Notes (Unsigned)
I connected with  Marissa Blankenship on 10/06/2022 by a audio enabled telemedicine application and verified that I am speaking with the correct person using two identifiers.  Patient Location: Home  Provider Location: Home Office  I discussed the limitations of evaluation and management by telemedicine. The patient expressed understanding and agreed to proceed.   Subjective:   Marissa Blankenship is a 57 y.o. female who presents for Medicare Annual (Subsequent) preventive examination.  Review of Systems    Per HPI unless specifically indicated below.  Cardiac Risk Factors include: advanced age (>76men, >68 women);female gender, Essential hypertension          Objective:       08/12/2022   10:44 AM 08/12/2022    9:59 AM 04/25/2022    9:24 AM  Vitals with BMI  Height  5\' 4"  5\' 4"   Weight  166 lbs 166 lbs 2 oz  BMI  123456 [id number]  Systolic [id number]  Diastolic 80 70 80  Pulse  60 58    Today's Vitals   10/06/22 0839  PainSc: 0-No pain   There is no height or weight on file to calculate BMI.     10/06/2022    8:48 AM 08/12/2022   10:02 AM 01/04/2022    3:16 PM 12/15/2021    3:45 PM 12/09/2020   10:18 AM 01/02/2020    1:42 PM 06/03/2019    3:57 PM  Advanced Directives  Does Patient Have a Medical Advance Directive? No No No No No No No  Would patient like information on creating a medical advance directive? No - Patient declined No - Patient declined No - Patient declined No - Patient declined Yes (MAU/Ambulatory/Procedural Areas - Information given) No - Patient declined No - Patient declined    Current Medications (verified) Outpatient Encounter Medications as of 10/06/2022  Medication Sig   amLODipine (NORVASC) 10 MG tablet TAKE 1 TABLET BY MOUTH EVERY DAY   omeprazole (PRILOSEC) 40 MG capsule TAKE 1 CAPSULE BY MOUTH EVERY DAY   ondansetron (ZOFRAN) 8 MG tablet Take 1 tablet (8 mg total) by mouth every 8 (eight) hours as needed for nausea or vomiting.   prochlorperazine  (COMPAZINE) 10 MG tablet Take 1 tablet (10 mg total) by mouth every 6 (six) hours as needed for nausea or vomiting.   STELARA 90 MG/ML SOSY injection INJECT 1 SYRINGE SUBCUTANEOUSLY  EVERY 4 WEEKS   telmisartan (MICARDIS) 40 MG tablet TAKE 1 TABLET BY MOUTH EVERYDAY AT BEDTIME   No facility-administered encounter medications on file as of 10/06/2022.    Allergies (verified) Patient has no known allergies.   History: Past Medical History:  Diagnosis Date   Acid reflux    Allergy    Anemia    Anxiety    Crohn's disease (HCC)    Depression    Hypertension    IBS (irritable bowel syndrome)    spastic colon   Plantar fasciitis    PRE-ECLAMPSIA 06/18/2007   Qualifier: Diagnosis of  By: Amil Amen MD, Benard Rink of breath    Small bowel obstruction [facility])    Past Surgical History:  Procedure Laterality Date   CESAREAN SECTION     2   COLONOSCOPY     COLONOSCOPY WITH PROPOFOL N/A 12/02/2015   Procedure: COLONOSCOPY WITH PROPOFOL;  Surgeon: Doran Stabler, MD;  Location: [facility];  Service: Gastroenterology;  Laterality: N/A;   ESOPHAGOGASTRODUODENOSCOPY     LAPAROSCOPIC ILEOCECECTOMY N/A 01/31/2017  Procedure: LAPAROSCOPIC LYSIS OF ADHESIONS, ILEOCECECTOMY, STRICTUROPLASTY, BLADDER REPAIR, RIGHT SALPINGECTOMY;  Surgeon: Michael Boston, MD;  Location: [facility];  Service: General;  Laterality: N/A;   UPPER GASTROINTESTINAL ENDOSCOPY     WISDOM TOOTH EXTRACTION     Family History  Problem Relation Age of Onset   Hypertension Father 9   Hypertension Mother 89   Crohn's disease Sister    Diabetes Paternal Grandmother    Hypertension Brother    Colon cancer Neg Hx    Pancreatic cancer Neg Hx    Stomach cancer Neg Hx    Esophageal cancer Neg Hx    Rectal cancer Neg Hx    Social History   Socioeconomic History   Marital status: Divorced    Spouse name: Not on file   Number of children: 3   Years of education: Not on file   Highest education level: Associate  degree: academic program  Occupational History   Occupation: disability   Tobacco Use   Smoking status: Former    Types: Cigarettes    Quit date: 04/13/2022    Years since quitting: 0.4   Smokeless tobacco: Never  Vaping Use   Vaping Use: Never used  Substance and Sexual Activity   Alcohol use: Yes    Comment: Occassionally    Drug use: No   Sexual activity: Yes    Partners: Male  Other Topics Concern   Not on file  Social History Narrative   Patient lives in [place] with her two sons.    Patient has a daughter who she is close with.    Patient enjoys walking for exercise.   Patient enjoys spending time with her grandchildren.       Social Determinants of Health   Financial Resource Strain: Low Risk  (10/06/2022)   Overall Financial Resource Strain (CARDIA)    Difficulty of Paying Living Expenses: Not hard at all  Food Insecurity: No Food Insecurity (10/06/2022)   Hunger Vital Sign    Worried About Running Out of Food in the Last Year: Never true    Ran Out of Food in the Last Year: Never true  Transportation Needs: No Transportation Needs (10/06/2022)   PRAPARE - Hydrologist (Medical): No    Lack of Transportation (Non-Medical): No  Physical Activity: Inactive (10/06/2022)   Exercise Vital Sign    Days of Exercise per Week: 0 days    Minutes of Exercise per Session: 0 min  Stress: Stress Concern Present (10/06/2022)   [place]    Feeling of Stress : To some extent  Social Connections: Moderately Isolated (10/06/2022)   Social Connection and Isolation Panel [NHANES]    Frequency of Communication with Friends and Family: More than three times a week    Frequency of Social Gatherings with Friends and Family: More than three times a week    Attends Religious Services: More than 4 times per year    Active Member of [employer] or Organizations: No    Attends Arts administrator: Never    Marital Status: Divorced    Tobacco Counseling Counseling given: No   Clinical Intake:  Pre-visit preparation completed: No  Pain : No/denies pain Pain Score: 0-No pain     Nutritional Status: BMI of 19-24  Normal Nutritional Risks: None Diabetes: No  How often do you need to have someone help you when you read instructions, pamphlets, or other written materials from your  doctor or pharmacy?: 1 - Never  Diabetic?No  Interpreter Needed?: No  Information entered by :: Donnie Mesa, CMA   Activities of Daily Living    10/06/2022    8:36 AM  In your present state of health, do you have any difficulty performing the following activities:  Hearing? 0  Vision? 0  Comment [facility]  Difficulty concentrating or making decisions? 1  Walking or climbing stairs? 0  Dressing or bathing? 0  Doing errands, shopping? 0    Patient Care Team: Leslie Dales, DO as PCP - General (Family Medicine) Leighton Ruff, MD as Consulting Physician (General Surgery) Yoe, Kirke Corin, MD as Consulting Physician (Gastroenterology) Michael Boston, MD as Consulting Physician (General Surgery) Shea Evans Norva Riffle, LCSW as [place] (Licensed Clinical Social Worker)  Indicate any recent [place] you may have received from other than [facility] providers in the past year (date may be approximate).     Assessment:   This is a routine wellness examination for Marissa Blankenship.  Hearing/Vision screen Denies any hearing issues. Denies any vision changes. Annual Eye Exam, [facility]   Dietary issues and exercise activities discussed: Current Exercise Habits: The patient does not participate in regular exercise at present, Exercise limited by: None identified   Goals Addressed   None    Depression Screen    10/06/2022    8:35 AM 08/17/2022   10:23 AM 08/12/2022   10:02 AM 01/04/2022    3:17 PM 12/15/2021    3:45 PM 01/05/2021    10:45 AM 12/09/2020   10:15 AM  PHQ 2/9 Scores  PHQ - 2 Score 1 2 2 1 1 1 1   PHQ- 9 Score  7 7 6 10 8      Fall Risk    10/06/2022    8:36 AM 12/15/2021    3:44 PM 12/09/2020   10:19 AM 06/03/2019    3:57 PM 01/28/2019   10:59 AM  Fall Risk   Falls in the past year? 0 0 0 0 0  Number falls in past yr: 0 0     Injury with Fall? 0 0     Risk for fall due to : No Fall Risks  No Fall Risks    Follow up Falls evaluation completed  Falls prevention discussed      FALL RISK PREVENTION PERTAINING TO THE HOME:  Any stairs in or around the home? Yes  If so, are there any without handrails? No  Home free of loose throw rugs in walkways, pet beds, electrical cords, etc? Yes  Adequate lighting in your home to reduce risk of falls? Yes   ASSISTIVE DEVICES UTILIZED TO PREVENT FALLS:  Life alert? No  Use of a cane, walker or w/c? No  Grab bars in the bathroom? No  Shower chair or bench in shower? No  Elevated toilet seat or a handicapped toilet? No   TIMED UP AND GO:  Was the test performed? Unable to perform, virtual appointment   Cognitive Function:        10/06/2022    8:38 AM 12/09/2020   10:20 AM  6CIT Screen  What Year? 0 points 0 points  What month? 0 points 0 points  What time? 0 points 0 points  Count back from 20 0 points 0 points  Months in reverse 0 points 0 points  Repeat phrase 0 points 0 points  Total Score 0 points 0 points    Immunizations Immunization History  Administered Date(s) Administered   COVID-19, mRNA, vaccine(Comirnaty)12 years and older 09/08/2022   Hep A / Hep B 02/17/2016, 03/22/2016, 08/25/2016   Influenza Whole 05/25/2010   Influenza, Seasonal, Injecte, Preservative Fre 05/10/2017   Influenza,inj,Quad PF,6+ Mos 12/04/2015, 04/16/2018, 04/08/2019, 05/07/2021, 04/11/2022   Influenza-Unspecified 04/03/2020   PFIZER(Purple Top)SARS-COV-2 Vaccination 10/29/2019, 11/19/2019, 03/10/2020   Pfizer Covid-19 Vaccine Bivalent Booster 11yrs & up  05/07/2021   Pneumococcal Conjugate-13 03/05/2019   Pneumococcal Polysaccharide-23 12/04/2015   Rho (D) Immune Globulin 07/06/2011   Td 07/18/1998   Tdap 07/06/2011   Zoster Recombinat (Shingrix) 05/14/2019   Zoster, Live 08/15/2019    TDAP status: Due, Education has been provided regarding the importance of this vaccine. Advised may receive this vaccine at local pharmacy or Health Dept. Aware to provide a copy of the vaccination record if obtained from local pharmacy or Health Dept. Verbalized acceptance and understanding.  Flu Vaccine status: Up to date  Pneumococcal Vaccine: not applicable  0000000 vaccine status: Completed vaccines  Qualifies for Shingles Vaccine? Yes   Zostavax completed No   Shingrix Completed?: No.    Education has been provided regarding the importance of this vaccine. Patient has been advised to call insurance company to determine out of pocket expense if they have not yet received this vaccine. Advised may also receive vaccine at local pharmacy or Health Dept. Verbalized acceptance and understanding.  Screening Tests Health Maintenance  Topic Date Due   Zoster Vaccines- Shingrix (2 of 2) 07/09/2019   DTaP/Tdap/Td (3 - Td or Tdap) 07/05/2021   COVID-19 Vaccine (6 - 2023-24 season) 11/03/2022   MAMMOGRAM  04/21/2023   Medicare Annual Wellness (AWV)  10/06/2023   PAP SMEAR-Modifier  01/06/2024   COLONOSCOPY (Pts 45-49yrs Insurance coverage will need to be confirmed)  07/31/2031   INFLUENZA VACCINE  Completed   Hepatitis C Screening  Completed   HIV Screening  Completed   HPV VACCINES  Aged Out    Health Maintenance  Health Maintenance Due  Topic Date Due   Zoster Vaccines- Shingrix (2 of 2) 07/09/2019   DTaP/Tdap/Td (3 - Td or Tdap) 07/05/2021    Colorectal cancer screening: Type of screening: Colonoscopy. Completed 07/30/2021. Repeat every 10 years  Mammogram status: Completed 04/20/2021. Repeat every year  DEXA Scan: not applicable   Lung  Cancer Screening: (Low Dose CT Chest recommended if Age 5-80 years, 30 pack-year currently smoking OR have quit w/in 15years.) does not qualify.   Lung Cancer Screening Referral: not applicable   Additional Screening:  Hepatitis C Screening: does qualify; Completed 12/03/2015  Vision Screening: Recommended annual ophthalmology exams for early detection of glaucoma and other disorders of the eye. Is the patient up to date with their annual eye exam?  Yes  Who is the provider or what is the name of the office in which the patient attends annual eye exams? [facility]  If pt is not established with a provider, would they like to be referred to a provider to establish care? No .   Dental Screening: Recommended annual dental exams for proper oral hygiene  Community Resource Referral / Chronic Care Management: CRR required this visit?  No   CCM required this visit?  No      Plan:     I have personally reviewed and noted the following in the patient's chart:   Medical and social history Use of alcohol, tobacco or illicit drugs  Current medications and supplements including opioid prescriptions. Patient is not currently taking opioid  prescriptions. Functional ability and status Nutritional status Physical activity Advanced directives List of other physicians Hospitalizations, surgeries, and ER visits in previous 12 months Vitals Screenings to include cognitive, depression, and falls Referrals and appointments  In addition, I have reviewed and discussed with patient certain preventive protocols, quality metrics, and best practice recommendations. A written personalized care plan for preventive services as well as general preventive health recommendations were provided to patient.    Marissa Blankenship , Thank you for taking time to come for your Medicare Wellness Visit. I appreciate your ongoing commitment to your health goals. Please review the following plan we discussed and let  me know if I can assist you in the future.   These are the goals we discussed:  Goals       Exercise 3x per week (30 min per time)      Dicussed walking for physical and mental health.  Patient enjoys short walks from "time to time" already.       patient wants to reside at same residence for next year. she is applying to requalify for Section 8 housing support (pt-stated)      Interventions:  LCSW talked with client today about her current needs. Client said she was obtaining her prescribed medications Discussed transport needs. Client said she is driving her car as needed to appointments and to complete errands Reviewed pain issues. Client said she had no serious pain issues.  Reviewed relaxation techniques of client. Client said she likes to walk for exercise and also enjoys listening to music Client reported that she had been busy today since it was election day. 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 LCSW thanked client for phone call with LCSW. LCSW to call client on 10/25/22 at 3:00  PM           This is a list of the screening recommended for you and due dates:  Health Maintenance  Topic Date Due   Zoster (Shingles) Vaccine (2 of 2) 07/09/2019   DTaP/Tdap/Td vaccine (3 - Td or Tdap) 07/05/2021   COVID-19 Vaccine (6 - 2023-24 season) 11/03/2022   Mammogram  04/21/2023   Medicare Annual Wellness Visit  10/06/2023   Pap Smear  01/06/2024   Colon Cancer Screening  07/31/2031   Flu Shot  Completed   Hepatitis C Screening: USPSTF Recommendation to screen - Ages 18-79 yo.  Completed   HIV Screening  Completed   HPV Vaccine  Aged [address] [place]   10/06/2022   Nurse Notes: Approximately 30 minute Non-Face -To-Face Medicare Wellness Visit

## 2022-10-06 ENCOUNTER — Ambulatory Visit (INDEPENDENT_AMBULATORY_CARE_PROVIDER_SITE_OTHER): Payer: 59

## 2022-10-06 DIAGNOSIS — Z0000 Encounter for general adult medical examination without abnormal findings: Secondary | ICD-10-CM | POA: Diagnosis not present

## 2022-10-06 NOTE — Progress Notes (Signed)
I reviewed the Medications, Problem list, Past Medical, Surgical Histories, Family Histories, and Social Histories.  I reviewed the nurse note.  

## 2022-10-25 ENCOUNTER — Ambulatory Visit: Payer: Self-pay | Admitting: Licensed Clinical Social Worker

## 2022-10-25 NOTE — Patient Outreach (Signed)
  Care Coordination   Follow Up Visit Note   10/25/2022 Name: Marissa Blankenship MRN: [id number] DOB: 29-Dec-1965  Marissa Blankenship is a 57 y.o. year old female who sees Tiffany Kocher, [place] for primary care. I spoke with  Marissa Blankenship by phone today.  What matters to the patients health and wellness today?Wants to reside at same residence, wants to attend MD appointments, wants to do ADLs daily     Goals Addressed             This Visit's Progress    Patient wants to reside at same residence, wants to attend MD appointments, wants to do ADLs daily       Interventions: Client and LCSW discussed client needs Client and LCSW discussed housing situation of client. Client said she has been approved to reside at her residence for coming year. Discussed medication procurement Discussed transport needs. She drives her car as needed Discussed social support from her mother and friends Discussed food supply. She did not mention any food issues.  Reviewed energy level.  Encouraged client to call LCSW as needed for SW support at [phone].         SDOH assessments and interventions completed:  Yes    Care Coordination Interventions:  Yes, provided   Interventions Today    Flowsheet Row Most Recent Value  Chronic Disease   Chronic disease during today's visit Other  [spoke with client about client needs]  General Interventions   General Interventions Discussed/Reviewed General Interventions Discussed, [employer] program support. discussed housing needs]  Education Interventions   Education Provided Provided Education  Provided Verbal Education On [employer]  Mental Health Interventions   Mental Health Discussed/Reviewed Anxiety, Coping Strategies  [discussed relaxation techniques]  Pharmacy Interventions   Pharmacy Dicussed/Reviewed Pharmacy Topics Discussed  Safety Interventions   Safety Discussed/Reviewed Home Safety        Follow up plan:  Follow up call scheduled for 12/20/2022 at 10:30 AM    Encounter Outcome:  Pt. Visit Completed   Kelton Pillar.Violanda Bobeck MSW, LCSW Licensed Visual merchandiser [facility] Care Management [phone]

## 2022-10-25 NOTE — Patient Instructions (Signed)
Visit Information  Thank you for taking time to visit with me today. Please don't hesitate to contact me if I can be of assistance to you.   Following are the goals we discussed today:   Goals Addressed             This Visit's Progress    Patient wants to reside at same residence, wants to attend MD appointments, wants to do ADLs daily       Interventions: Client and LCSW discussed client needs Client and LCSW discussed housing situation of client. Client said she has been approved to reside at her residence for coming year. Discussed medication procurement Discussed transport needs. She drives her car as needed Discussed social support from her mother and friends Discussed food supply. She did not mention any food issues.  Reviewed energy level.  Encouraged client to call LCSW as needed for SW support at [phone].      Our next appointment is by telephone on 12/20/22 at 10: 30 AM  Please call the care guide team at [phone] if you need to cancel or reschedule your appointment.   If you are experiencing a Mental Health or Behavioral Health Crisis or need someone to talk to, please go to [facility] [address], [place] [phone])   The patient verbalized understanding of instructions, educational materials, and care plan provided today and DECLINED offer to receive copy of patient instructions, educational materials, and care plan.   The patient has been provided with contact information for the care management team and has been advised to call with any health related questions or concerns.   Kelton Pillar.Nanci Lakatos MSW, LCSW Licensed Visual merchandiser [facility] Care Management [phone]

## 2022-11-16 ENCOUNTER — Other Ambulatory Visit: Payer: Self-pay | Admitting: Student

## 2022-11-22 ENCOUNTER — Other Ambulatory Visit: Payer: Self-pay | Admitting: Student

## 2022-12-20 ENCOUNTER — Ambulatory Visit: Payer: Self-pay | Admitting: Licensed Clinical Social Worker

## 2022-12-20 NOTE — Patient Outreach (Signed)
  Care Coordination   12/20/2022 Name: Marissa Blankenship MRN: [id number] DOB: [birth year]   Care Coordination Outreach Attempts:  An unsuccessful telephone outreach was attempted today to offer the patient information about available care coordination services.  Follow Up Plan:  Additional outreach attempts will be made to offer the patient care coordination information and services.   Encounter Outcome:  No Answer   Care Coordination Interventions:  No, not indicated    Kelton Pillar.Sallyann Kinnaird MSW, LCSW Licensed Visual merchandiser [facility] Care Management [phone]

## 2022-12-22 ENCOUNTER — Ambulatory Visit (INDEPENDENT_AMBULATORY_CARE_PROVIDER_SITE_OTHER): Payer: 59 | Admitting: Student

## 2022-12-22 ENCOUNTER — Encounter: Payer: Self-pay | Admitting: Student

## 2022-12-22 VITALS — BP 140/80 | HR 63 | Ht 64.0 in | Wt 172.0 lb

## 2022-12-22 DIAGNOSIS — I10 Essential (primary) hypertension: Secondary | ICD-10-CM | POA: Diagnosis not present

## 2022-12-22 DIAGNOSIS — F32A Depression, unspecified: Secondary | ICD-10-CM | POA: Diagnosis not present

## 2022-12-22 DIAGNOSIS — R5383 Other fatigue: Secondary | ICD-10-CM

## 2022-12-22 DIAGNOSIS — F419 Anxiety disorder, unspecified: Secondary | ICD-10-CM

## 2022-12-22 DIAGNOSIS — H9313 Tinnitus, bilateral: Secondary | ICD-10-CM | POA: Diagnosis not present

## 2022-12-22 NOTE — Patient Instructions (Addendum)
It was great to see you! Thank you for allowing me to participate in your care!   I recommend that you always bring your medications to each appointment as this makes it easy to ensure we are on the correct medications and helps [place] not miss when refills are needed.  Our plans for today:  - You have an appointment for follow-up on 01/12/2024 @ 4:15 pm - Sometime in the next 2 weeks please come back for lab work, you do not need an appointment  Take care and seek immediate care sooner if you develop any concerns. Please remember to show up 15 minutes before your scheduled appointment time!  Tiffany Kocher, DO [facility]

## 2022-12-22 NOTE — Progress Notes (Addendum)
    SUBJECTIVE:   CHIEF COMPLAINT / HPI:   Tinnitus Occurred 4 approximately 2 weeks, intermittently by day.  Has resolved for 1 week now.  High-pitched ringing bilaterally, not pulsatile.  No change in hearing. No dizziness. Using Q-tips, counseled against.  Fatigue Going for 1 month. Going to bed tired and waking up tired. Cannot take elemental iron 2/2 constipation- using "blood builder" supplement but without for 2 weeks. No hx of thyroid disorders. No hx of diabetes. No aggravating/alleviating factors.  Worse during the middle of the day.  Has mild sleep apnea, not on CPAP nor dental device.  Denies smoking or other illicit drug use.  Alcohol use weekly, 1-2 standard drinks per week.  Primarily feeling tired, despite adequate sleep.  At the end of the visit, patient did endorse some depressed mood-we will discuss further at future appointment.  She denies suicidal and homicidal ideations.  PERTINENT  PMH / PSH:   OBJECTIVE:   BP (!) 140/80   Pulse 63   Ht 5\' 4"  (1.626 m)   Wt 172 lb (78 kg)   LMP 02/07/2017 (Approximate)   SpO2 100%   BMI 29.52 kg/m    General: NAD, pleasant Cardio: RRR, no MRG. Cap Refill <2s. Respiratory: CTAB, normal wob on RA Skin: Warm and dry Neuro: CN II: PERRL CN III, IV,VI: EOMI CV V: Normal sensation in V1, V2, V3 CVII: Symmetric smile and brow raise CN VIII: Normal hearing CN IX,X: Symmetric palate raise  UE and LE strength 5/5 2+ LE reflexes   ASSESSMENT/PLAN:   Fatigue Differential is broad and includes: Anemia, thyroid disorder, OSA, mood disorder.  With normal exam, lower concern for rheumatologic causes.  Known B12 deficiency, normal range 8 months ago (if anemic consider recheck). - CBC, TSH - Recommend follow-up with orthodontist for dental device for mild OSA - Follow-up scheduled in 3 weeks to discuss depressed mood  HTN Elevated 140/80 on recheck. Patient reports she is unsure if she took her BP medication. Recheck at  follow-up, consider medication management.  Depressed mood No SI/HI. Follow-up scheduled for 6/27 to discuss.  Tinnitus Resolved. Continue to monitor.  Follow up recommendations 1.)  Examine TMs bilaterally, particularly if tinnitus has returned 2.)  PHQ-9 3.) BP recheck  Tiffany Kocher, DO [facility]

## 2022-12-22 NOTE — Assessment & Plan Note (Addendum)
Differential is broad and includes: Anemia, thyroid disorder, OSA, mood disorder.  With normal exam, lower concern for rheumatologic causes.  Known B12 deficiency, normal range 8 months ago (if anemic consider recheck). - CBC, TSH - Recommend follow-up with orthodontist for dental device for mild OSA - Follow-up scheduled in 3 weeks to discuss depressed mood

## 2022-12-26 ENCOUNTER — Other Ambulatory Visit: Payer: 59

## 2022-12-26 DIAGNOSIS — R5383 Other fatigue: Secondary | ICD-10-CM

## 2022-12-27 ENCOUNTER — Telehealth: Payer: Self-pay | Admitting: *Deleted

## 2022-12-27 LAB — TSH RFX ON ABNORMAL TO FREE T4: TSH: 0.803 u[IU]/mL (ref 0.450–4.500)

## 2022-12-27 LAB — CBC
Hematocrit: 39.9 % (ref 34.0–46.6)
Hemoglobin: 12.7 g/dL (ref 11.1–15.9)
MCH: 28.3 pg (ref 26.6–33.0)
MCHC: 31.8 g/dL (ref 31.5–35.7)
MCV: 89 fL (ref 79–97)
Platelets: 242 10*3/uL (ref 150–450)
RBC: 4.49 x10E6/uL (ref 3.77–5.28)
RDW: 13.9 % (ref 11.7–15.4)
WBC: 4.6 10*3/uL (ref 3.4–10.8)

## 2022-12-27 NOTE — Progress Notes (Signed)
  Care Coordination Note  12/27/2022 Name: [name] MRN: [id number] DOB: 05-Feb-1966  Marissa Blankenship is a 57 y.o. year old female who is a primary care patient of Tiffany Kocher, DO and is actively engaged with the care management team. I reached out to Marissa Blankenship by phone today to assist with re-scheduling a follow up visit with the Licensed Clinical Social Worker  Follow up plan: Patient declines further follow up and engagement by the care management team. Appropriate care team members and provider have been notified via electronic communication.   [facility]  Care Coordination Care Guide  Direct Dial: [phone]

## 2023-01-12 ENCOUNTER — Ambulatory Visit: Payer: 59 | Admitting: Student

## 2023-01-12 NOTE — Progress Notes (Deleted)
    SUBJECTIVE:   CHIEF COMPLAINT / HPI:   ***  PERTINENT  PMH / PSH: ***  OBJECTIVE:   LMP 02/07/2017 (Approximate)   ***  ASSESSMENT/PLAN:   No problem-specific Assessment & Plan notes found for this encounter.     Tiffany Kocher, DO [facility]

## 2023-02-04 ENCOUNTER — Telehealth: Payer: Self-pay | Admitting: Gastroenterology

## 2023-02-06 NOTE — Telephone Encounter (Signed)
Patient is overdue for a follow up appt with Dr. Myrtie Neither. Called and left patient a detailed vm letting her know that she needs to call to schedule an office visit before we can authorize additional Stelara refills. Once appt has been scheduled we will refill.

## 2023-02-08 NOTE — Telephone Encounter (Signed)
Called and spoke with patient. Pt has been scheduled for an office visit on 05/10/23 at 10 am. Refill for Stelara sent to specialty pharmacy. Pt verbalized understanding and had no concerns at the end of the call.

## 2023-02-12 ENCOUNTER — Other Ambulatory Visit: Payer: Self-pay | Admitting: Gastroenterology

## 2023-02-12 ENCOUNTER — Other Ambulatory Visit: Payer: Self-pay | Admitting: Student

## 2023-04-03 ENCOUNTER — Other Ambulatory Visit: Payer: Self-pay | Admitting: Student

## 2023-05-06 ENCOUNTER — Other Ambulatory Visit: Payer: Self-pay | Admitting: Gastroenterology

## 2023-05-10 ENCOUNTER — Ambulatory Visit: Payer: 59 | Admitting: Gastroenterology

## 2023-05-10 ENCOUNTER — Other Ambulatory Visit (INDEPENDENT_AMBULATORY_CARE_PROVIDER_SITE_OTHER): Payer: 59

## 2023-05-10 ENCOUNTER — Encounter: Payer: Self-pay | Admitting: Gastroenterology

## 2023-05-10 VITALS — BP 126/80 | HR 72 | Wt 171.0 lb

## 2023-05-10 DIAGNOSIS — Z79899 Other long term (current) drug therapy: Secondary | ICD-10-CM | POA: Diagnosis not present

## 2023-05-10 DIAGNOSIS — R110 Nausea: Secondary | ICD-10-CM

## 2023-05-10 DIAGNOSIS — K5000 Crohn's disease of small intestine without complications: Secondary | ICD-10-CM | POA: Diagnosis not present

## 2023-05-10 LAB — IBC + FERRITIN
Ferritin: 62.6 ng/mL (ref 10.0–291.0)
Iron: 86 ug/dL (ref 42–145)
Saturation Ratios: 23.3 % (ref 20.0–50.0)
TIBC: 369.6 ug/dL (ref 250.0–450.0)
Transferrin: 264 mg/dL (ref 212.0–360.0)

## 2023-05-10 MED ORDER — NA SULFATE-K SULFATE-MG SULF 17.5-3.13-1.6 GM/177ML PO SOLN: 1.0000 | Freq: Once | ORAL | 0 refills | Status: AC

## 2023-05-10 NOTE — Progress Notes (Deleted)
[facility] progress note:  History: Marissa Blankenship 05/10/2023  Reason for consult/chief complaint: Crohn's disease   Subjective  HPI: Marissa Blankenship was last seen in the office October 2023.  She has ileal Crohn's disease with ileocecectomy several years ago and now on Stelara every 4 weeks.  Last colonoscopy January 2023 showed significant improvement from the prior exam (Rutgeert's i1).  She was previously intolerant of azathioprine due to arthralgias and on a few occasions I have offered a trial of 6-MP or methotrexate but she has been reluctant to do so. Chronic nausea, EGD 05/2019, H pylori negative (CLO test off PPI).  Assessment and plan at that visit: "Chronic stable ileal Crohn's disease with prior surgery, currently on Stelara with good symptom control. Nausea with previous unrevealing endoscopic work-up, treated symptomatically.  Plan: ESR,CRP, fecal calprotectin, CBC, CMP, iron studies, B12, folate today If these are stable, plan 62-month follow-up with me.  We again discussed immunomodulators therapy, and I specifically recommended she consider mercaptopurine.  She again politely declines citing concern of additional immunosuppressive risks." _____________________________  Fecal calprotectin was elevated to 280 (ESR 27), so I felt we needed to keep a closer eye on her Crohn's and wanted to see her back in about 3 months.  ***   ROS:  Review of Systems   Past Medical History: Past Medical History:  Diagnosis Date   Acid reflux    Allergy    Anemia    Anxiety    Crohn's disease (HCC)    Depression    Hypertension    IBS (irritable bowel syndrome)    spastic colon   Plantar fasciitis    PRE-ECLAMPSIA 06/18/2007   Qualifier: Diagnosis of  By: Delrae Alfred MD, Maryruth Eve of breath    Small bowel obstruction [facility])      Past Surgical History: Past Surgical History:  Procedure Laterality Date   CESAREAN SECTION     2   COLONOSCOPY      COLONOSCOPY WITH PROPOFOL N/A 12/02/2015   Procedure: COLONOSCOPY WITH PROPOFOL;  Surgeon: Sherrilyn Rist, MD;  Location: [facility];  Service: Gastroenterology;  Laterality: N/A;   ESOPHAGOGASTRODUODENOSCOPY     LAPAROSCOPIC ILEOCECECTOMY N/A 01/31/2017   Procedure: LAPAROSCOPIC LYSIS OF ADHESIONS, ILEOCECECTOMY, STRICTUROPLASTY, BLADDER REPAIR, RIGHT SALPINGECTOMY;  Surgeon: Karie Soda, MD;  Location: [facility];  Service: General;  Laterality: N/A;   UPPER GASTROINTESTINAL ENDOSCOPY     WISDOM TOOTH EXTRACTION       Family History: Family History  Problem Relation Age of Onset   Hypertension Father 15   Hypertension Mother 27   Crohn's disease Sister    Diabetes Paternal Grandmother    Hypertension Brother    Colon cancer Neg Hx    Pancreatic cancer Neg Hx    Stomach cancer Neg Hx    Esophageal cancer Neg Hx    Rectal cancer Neg Hx     Social History: Social History   Socioeconomic History   Marital status: Divorced    Spouse name: Not on file   Number of children: 3   Years of education: Not on file   Highest education level: Associate degree: academic program  Occupational History   Occupation: disability   Tobacco Use   Smoking status: Former    Current packs/day: 0.00    Types: Cigarettes    Quit date: 04/13/2022    Years since quitting: 1.0   Smokeless tobacco: Never  Vaping Use   Vaping status: Never  Used  Substance and Sexual Activity   Alcohol use: Yes    Comment: Occassionally    Drug use: No   Sexual activity: Yes    Partners: Male  Other Topics Concern   Not on file  Social History Narrative   Patient lives in [place] with her two sons.    Patient has a daughter who she is close with.    Patient enjoys walking for exercise.   Patient enjoys spending time with her grandchildren.       Social Determinants of Health   Financial Resource Strain: Low Risk  (10/06/2022)   Overall Financial Resource Strain (CARDIA)    Difficulty of Paying  Living Expenses: Not hard at all  Food Insecurity: No Food Insecurity (10/06/2022)   Hunger Vital Sign    Worried About Running Out of Food in the Last Year: Never true    Ran Out of Food in the Last Year: Never true  Transportation Needs: No Transportation Needs (10/06/2022)   PRAPARE - Administrator, Civil Service (Medical): No    Lack of Transportation (Non-Medical): No  Physical Activity: Inactive (10/06/2022)   Exercise Vital Sign    Days of Exercise per Week: 0 days    Minutes of Exercise per Session: 0 min  Stress: Stress Concern Present (10/06/2022)   [employer] of Occupational Health - Occupational Stress Questionnaire    Feeling of Stress : To some extent  Social Connections: Moderately Isolated (10/06/2022)   Social Connection and Isolation Panel [NHANES]    Frequency of Communication with Friends and Family: More than three times a week    Frequency of Social Gatherings with Friends and Family: More than three times a week    Attends Religious Services: More than 4 times per year    Active Member of [employer] or Organizations: No    Attends Engineer, structural: Never    Marital Status: Divorced    Allergies: No Known Allergies  Outpatient Meds: Current Outpatient Medications  Medication Sig Dispense Refill   amLODipine (NORVASC) 10 MG tablet TAKE 1 TABLET BY MOUTH EVERY DAY 90 tablet 0   omeprazole (PRILOSEC) 40 MG capsule TAKE 1 CAPSULE BY MOUTH EVERY DAY 90 capsule 1   ondansetron (ZOFRAN) 8 MG tablet Take 1 tablet (8 mg total) by mouth every 8 (eight) hours as needed for nausea or vomiting. 60 tablet 2   prochlorperazine (COMPAZINE) 10 MG tablet Take 1 tablet (10 mg total) by mouth every 6 (six) hours as needed for nausea or vomiting. 30 tablet 0   telmisartan (MICARDIS) 40 MG tablet TAKE 1 TABLET BY MOUTH EVERYDAY AT BEDTIME 90 tablet 0   ustekinumab (STELARA) 90 MG/ML SOSY injection INJECT 1 SYRINGE SUBCUTANEOUSLY  EVERY 4 WEEKS 1 mL 2   No  current facility-administered medications for this visit.      ___________________________________________________________________ Objective   Exam:  LMP 02/07/2017 (Approximate)  Wt Readings from Last 3 Encounters:  12/22/22 172 lb (78 kg)  08/12/22 166 lb (75.3 kg)  04/25/22 166 lb 2 oz (75.4 kg)    General: ***  Eyes: sclera anicteric, no redness ENT: oral mucosa moist without lesions, no cervical or supraclavicular lymphadenopathy CV: ***, no JVD, no peripheral edema Resp: clear to auscultation bilaterally, normal RR and effort noted GI: soft, *** tenderness, with active bowel sounds. No guarding or palpable organomegaly noted. Skin; warm and dry, no rash or jaundice noted Neuro: awake, alert and oriented x 3. Normal gross motor  function and fluent speech  Labs:  ***  Radiologic Studies:  ***  Assessment: No diagnosis found.  ***  Plan:  ***  Thank you for the courtesy of this consult.  Please call me with any questions or concerns.  Charlie Pitter III  CC: Referring provider noted above

## 2023-05-10 NOTE — Progress Notes (Signed)
[facility] progress note:  History: Marissa Blankenship 05/10/2023  Reason for consult/chief complaint:  Chief Complaint  Patient presents with   Crohn's Disease    Everything is going the same,managable.    Subjective  HPI: Marissa Blankenship was last seen in the office October 2023.  She has ileal Crohn's disease with ileocecectomy several years ago and now on Stelara every 4 weeks.  Last colonoscopy January 2023 showed significant improvement from the prior exam (Rutgeert's i1).  She was previously intolerant of azathioprine due to arthralgias and on a few occasions I have offered a trial of 6-MP or methotrexate but she has been reluctant to do so. Chronic nausea, EGD 05/2019, H pylori negative (CLO test off PPI).  Assessment and plan at that visit: "Chronic stable ileal Crohn's disease with prior surgery, currently on Stelara with good symptom control. Nausea with previous unrevealing endoscopic work-up, treated symptomatically.  Plan: ESR,CRP, fecal calprotectin, CBC, CMP, iron studies, B12, folate today If these are stable, plan 2-month follow-up with me.  We again discussed immunomodulators therapy, and I specifically recommended she consider mercaptopurine.  She again politely declines citing concern of additional immunosuppressive risks." _____________________________  Fecal calprotectin was elevated to 280 (ESR 27), so I felt we needed to keep a closer eye on her Crohn's and wanted to see her back in about 3 months.  Today, she reports feeling well overall. She states that she typically has a BM once in the mornings; however, she states if she has an unusual food that day, she will generally have another BM. She states that she typically tries to avoid beef and greasy foods as they don;t agree with her. She reports compliance and good tolerance of Stelara 90 mg every 4 weeks. She will experience nausea every now and then but states that her symptoms are well manageable  with Zofran 8 mg. Se denies any abdomina pain.   She is planning on getting the flu/Covid combo vaccine soon. She also complains of arthritis pain in both her left and right hands. We also discussed her last visit including her previous cal protectin levels and undergoing a recall colonoscopy.   Patient denies diarrhea, constipation, blood in stool, black stool, vomiting, bloating, unintentional weight loss, reflux, dysphagia.    ROS:  Review of Systems  Constitutional:  Negative for appetite change and fever.  HENT:  Negative for trouble swallowing.   Respiratory:  Negative for cough and shortness of breath.   Cardiovascular:  Negative for chest pain.  Gastrointestinal:  Positive for nausea. Negative for abdominal distention, abdominal pain, anal bleeding, blood in stool, constipation, diarrhea, rectal pain and vomiting.  Genitourinary:  Negative for dysuria.  Musculoskeletal:  Negative for back pain.  Skin:  Negative for rash.  Neurological:  Negative for weakness.  All other systems reviewed and are negative.    Past Medical History: Past Medical History:  Diagnosis Date   Acid reflux    Allergy    Anemia    Anxiety    Crohn's disease (HCC)    Depression    Hypertension    IBS (irritable bowel syndrome)    spastic colon   Plantar fasciitis    PRE-ECLAMPSIA 06/18/2007   Qualifier: Diagnosis of  By: Delrae Alfred MD, Maryruth Eve of breath    Small bowel obstruction [facility])      Past Surgical History: Past Surgical History:  Procedure Laterality Date   CESAREAN SECTION     2   COLONOSCOPY  COLONOSCOPY WITH PROPOFOL N/A 12/02/2015   Procedure: COLONOSCOPY WITH PROPOFOL;  Surgeon: Sherrilyn Rist, MD;  Location: [facility];  Service: Gastroenterology;  Laterality: N/A;   ESOPHAGOGASTRODUODENOSCOPY     LAPAROSCOPIC ILEOCECECTOMY N/A 01/31/2017   Procedure: LAPAROSCOPIC LYSIS OF ADHESIONS, ILEOCECECTOMY, STRICTUROPLASTY, BLADDER REPAIR, RIGHT SALPINGECTOMY;   Surgeon: Karie Soda, MD;  Location: [facility];  Service: General;  Laterality: N/A;   UPPER GASTROINTESTINAL ENDOSCOPY     WISDOM TOOTH EXTRACTION       Family History: Family History  Problem Relation Age of Onset   Hypertension Father 47   Hypertension Mother 56   Crohn's disease Sister    Diabetes Paternal Grandmother    Hypertension Brother    Colon cancer Neg Hx    Pancreatic cancer Neg Hx    Stomach cancer Neg Hx    Esophageal cancer Neg Hx    Rectal cancer Neg Hx     Social History: Social History   Socioeconomic History   Marital status: Divorced    Spouse name: Not on file   Number of children: 3   Years of education: Not on file   Highest education level: Associate degree: academic program  Occupational History   Occupation: disability   Tobacco Use   Smoking status: Former    Current packs/day: 0.00    Types: Cigarettes    Quit date: 04/13/2022    Years since quitting: 1.0   Smokeless tobacco: Never  Vaping Use   Vaping status: Never Used  Substance and Sexual Activity   Alcohol use: Yes    Comment: Occassionally    Drug use: No   Sexual activity: Yes    Partners: Male  Other Topics Concern   Not on file  Social History Narrative   Patient lives in [place] with her two sons.    Patient has a daughter who she is close with.    Patient enjoys walking for exercise.   Patient enjoys spending time with her grandchildren.       Social Determinants of Health   Financial Resource Strain: Low Risk  (10/06/2022)   Overall Financial Resource Strain (CARDIA)    Difficulty of Paying Living Expenses: Not hard at all  Food Insecurity: No Food Insecurity (10/06/2022)   Hunger Vital Sign    Worried About Running Out of Food in the Last Year: Never true    Ran Out of Food in the Last Year: Never true  Transportation Needs: No Transportation Needs (10/06/2022)   PRAPARE - Administrator, Civil Service (Medical): No    Lack of Transportation  (Non-Medical): No  Physical Activity: Inactive (10/06/2022)   Exercise Vital Sign    Days of Exercise per Week: 0 days    Minutes of Exercise per Session: 0 min  Stress: Stress Concern Present (10/06/2022)   [employer] of Occupational Health - Occupational Stress Questionnaire    Feeling of Stress : To some extent  Social Connections: Moderately Isolated (10/06/2022)   Social Connection and Isolation Panel [NHANES]    Frequency of Communication with Friends and Family: More than three times a week    Frequency of Social Gatherings with Friends and Family: More than three times a week    Attends Religious Services: More than 4 times per year    Active Member of [employer] or Organizations: No    Attends Banker Meetings: Never    Marital Status: Divorced    Allergies: No Known Allergies  Outpatient Meds:  Current Outpatient Medications  Medication Sig Dispense Refill   amLODipine (NORVASC) 10 MG tablet TAKE 1 TABLET BY MOUTH EVERY DAY 90 tablet 0   Na Sulfate-K Sulfate-Mg Sulf 17.5-3.13-1.6 GM/177ML SOLN Take 1 kit by mouth once for 1 dose. 354 mL 0   omeprazole (PRILOSEC) 40 MG capsule TAKE 1 CAPSULE BY MOUTH EVERY DAY 90 capsule 1   ondansetron (ZOFRAN) 8 MG tablet Take 1 tablet (8 mg total) by mouth every 8 (eight) hours as needed for nausea or vomiting. 60 tablet 2   prochlorperazine (COMPAZINE) 10 MG tablet Take 1 tablet (10 mg total) by mouth every 6 (six) hours as needed for nausea or vomiting. 30 tablet 0   telmisartan (MICARDIS) 40 MG tablet TAKE 1 TABLET BY MOUTH EVERYDAY AT BEDTIME 90 tablet 0   ustekinumab (STELARA) 90 MG/ML SOSY injection INJECT 1 SYRINGE SUBCUTANEOUSLY  EVERY 4 WEEKS 1 mL 2   No current facility-administered medications for this visit.      ___________________________________________________________________ Objective   Exam:  BP 126/80   Pulse 72   Wt 171 lb (77.6 kg)   LMP 02/07/2017 (Approximate)   BMI 29.35 kg/m  Wt Readings  from Last 3 Encounters:  05/10/23 171 lb (77.6 kg)  12/22/22 172 lb (78 kg)  08/12/22 166 lb (75.3 kg)   General: well-appearing   Eyes: sclera anicteric, no redness ENT: oral mucosa moist without lesions, no cervical or supraclavicular lymphadenopathy CV: RRR, no JVD, no peripheral edema Resp: clear to auscultation bilaterally, normal RR and effort noted GI: soft, no tenderness, with active bowel sounds. No guarding or palpable organomegaly noted. Skin; warm and dry, no rash or jaundice noted Neuro: awake, alert and oriented x 3. Normal gross motor function and fluent speech   Labs:     Latest Ref Rng & Units 12/26/2022   12:29 PM 04/25/2022   10:21 AM 07/30/2021    1:07 PM  CBC  WBC 3.4 - 10.8 x10E3/uL 4.6  5.8  6.0   Hemoglobin 11.1 - 15.9 g/dL 40.9  81.1  91.4   Hematocrit 34.0 - 46.6 % 39.9  40.1  38.4   Platelets 150 - 450 x10E3/uL 242  209.0  206.0    No recent iron/B12 or folic acid levels Radiologic Studies:   Assessment: Crohn's ileitis with prior ileocecal resection, nonresponder to anti-TNF therapy, doing well at this point on Stelara every 4 weeks.  She has not wanted to use additional immunomodulators medicine due to previous adverse effects from azathioprine and concerns about additional immune suppression.   Plan: -Colonoscopy for disease activity assessment.  She would like to do that in January, and I think that is fine since she is currently clinically stable. -B12, folate, ferritin, iron panel today  Thank you for the courtesy of this consult.  Please call me with any questions or concerns.   I,Safa M Kadhim,acting as a scribe for Charlie Pitter III, MD.,have documented all relevant documentation on the behalf of Sherrilyn Rist, MD,as directed by  Sherrilyn Rist, MD while in the presence of Sherrilyn Rist, MD.   Marvis Repress III, MD, have reviewed all documentation for this visit. The documentation on 05/10/23 for the exam, diagnosis,  procedures, and orders are all accurate and complete.    CC: Referring provider noted above

## 2023-05-10 NOTE — Patient Instructions (Addendum)
Your provider has requested that you go to the basement level for lab work before leaving today. Press "B" on the elevator. The lab is located at the first door on the left as you exit the elevator.   You have been scheduled for a colonoscopy. Please follow written instructions given to you at your visit today.   Please pick up your prep supplies at the pharmacy within the next 1-3 days.  If you use inhalers (even only as needed), please bring them with you on the day of your procedure.  DO NOT TAKE 7 DAYS PRIOR TO TEST- Trulicity (dulaglutide) Ozempic, Wegovy (semaglutide) Mounjaro (tirzepatide) Bydureon Bcise (exanatide extended release)  DO NOT TAKE 1 DAY PRIOR TO YOUR TEST Rybelsus (semaglutide) Adlyxin (lixisenatide) Victoza (liraglutide) Byetta (exanatide) ___________________________________________________________________________   _______________________________________________________  If your blood pressure at your visit was 140/90 or greater, please contact your primary care physician to follow up on this.  _______________________________________________________  If you are age 31 or older, your body mass index should be between 23-30. Your Body mass index is 29.35 kg/m. If this is out of the aforementioned range listed, please consider follow up with your Primary Care Provider.  If you are age 70 or younger, your body mass index should be between 19-25. Your Body mass index is 29.35 kg/m. If this is out of the aformentioned range listed, please consider follow up with your Primary Care Provider.   ________________________________________________________  The [facility] providers would like to encourage you to use [facility] to communicate with providers for non-urgent requests or questions.  Due to long hold times on the telephone, sending your provider a message by [facility] may be a faster and more efficient way to get a response.  Please allow 48 business hours for a  response.  Please remember that this is for non-urgent requests.  _______________________________________________________ It was a pleasure to see you today!  Thank you for trusting me with your gastrointestinal care!

## 2023-05-11 ENCOUNTER — Other Ambulatory Visit: Payer: Self-pay | Admitting: Student

## 2023-05-11 DIAGNOSIS — H35033 Hypertensive retinopathy, bilateral: Secondary | ICD-10-CM | POA: Diagnosis not present

## 2023-05-11 DIAGNOSIS — H2513 Age-related nuclear cataract, bilateral: Secondary | ICD-10-CM | POA: Diagnosis not present

## 2023-05-11 DIAGNOSIS — H04123 Dry eye syndrome of bilateral lacrimal glands: Secondary | ICD-10-CM | POA: Diagnosis not present

## 2023-05-11 DIAGNOSIS — H524 Presbyopia: Secondary | ICD-10-CM | POA: Diagnosis not present

## 2023-05-11 DIAGNOSIS — H35012 Changes in retinal vascular appearance, left eye: Secondary | ICD-10-CM | POA: Diagnosis not present

## 2023-05-22 ENCOUNTER — Ambulatory Visit (INDEPENDENT_AMBULATORY_CARE_PROVIDER_SITE_OTHER): Payer: 59

## 2023-05-22 DIAGNOSIS — Z23 Encounter for immunization: Secondary | ICD-10-CM

## 2023-05-22 NOTE — Progress Notes (Signed)
Patient presents to nurse clinic for Flu and Covid vaccines.  Vaccines administered without complication. See admin for details.

## 2023-05-30 ENCOUNTER — Other Ambulatory Visit: Payer: Self-pay

## 2023-06-23 ENCOUNTER — Ambulatory Visit: Payer: 59

## 2023-06-26 ENCOUNTER — Other Ambulatory Visit: Payer: Self-pay | Admitting: Student

## 2023-06-26 ENCOUNTER — Ambulatory Visit: Payer: 59

## 2023-06-26 VITALS — BP 129/84 | HR 66 | Ht 64.0 in | Wt 173.0 lb

## 2023-06-26 DIAGNOSIS — M25532 Pain in left wrist: Secondary | ICD-10-CM

## 2023-06-26 DIAGNOSIS — G8929 Other chronic pain: Secondary | ICD-10-CM | POA: Diagnosis not present

## 2023-06-26 DIAGNOSIS — M25562 Pain in left knee: Secondary | ICD-10-CM | POA: Diagnosis not present

## 2023-06-26 DIAGNOSIS — M25531 Pain in right wrist: Secondary | ICD-10-CM

## 2023-06-26 NOTE — Patient Instructions (Addendum)
Good to see you today - Thank you for coming in  Things we discussed today:  1) The pain in your hands and wrists are most likely due to chronic strain leading to osteoarthritis or carpal tunnel syndrome. These are both conditions that cause pain in the area due to strain of the muscles and nerves of the area. - You can continue applying Voltaren gel to the area as needed for pain - Wearing a thumb spica wrist splint can help stabilize the hand and soothe the pain - If the pain becomes more bothersome, we can consider getting more tests in the future  2) The pain in your left knee is most likely arthritis. -I recommend regular exercise of your legs to help strengthen the muscles, and support the knee joint. -You can apply Voltaren gel to the knee as well as needed for pain -You can also take Tylenol as needed for pain. -If this pain becomes too bothersome, we can consider getting x-rays in the future.

## 2023-06-26 NOTE — Progress Notes (Signed)
    SUBJECTIVE:   CHIEF COMPLAINT / HPI:   Marissa Blankenship is a 57yo F w/ hx of HTN, Crohns that p/f hand and knee pain  BL Hand Pain (R>L) - was told she had hand arthritis in L hand ~1.5 yrs ago. Then recently starting to have similar pain in R hand.  - The R hand has been especially worse in the past 30 days - The pain is in thumb and radial side of hand/wrist. Occasional shoots up her wrist. Pain involves more the palmar side of hand, not dorsum.  - Not currently working, but reports chores around the house and using phone often.   L Knee Pain - about 1.5-2 yrs ago, had injury of L knee. Then having occasional aching pain in L knee.  - She had fallen on her knee from standing height. Now when she has the pain, it is mainly anterior aching nee pain, that radiates up her thigh. 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 Causes soreness in her lower back to worsen. - has used volateren on hand and lower back, and it was helpful.  OBJECTIVE:   BP 129/84   Pulse 66   Ht 5\' 4"  (1.626 m)   Wt 173 lb (78.5 kg)   LMP 02/07/2017 (Approximate)   SpO2 100%   BMI 29.70 kg/m   General: Alert, pleasant woman. NAD. HEENT: NCAT. MMM. CV: RRR, no murmurs.  Resp: CTAB, no wheezing or crackles. Normal WOB on RA.  Skin: Warm, well perfused  Msk: Tenderness of palmar thenar region of R hand. Full ROM of R wrist and fingers. No finger tenderness. No joint or finger swelling. Mild tenderness to palpation along antero-lateral aspect of L knee, no swelling or effusion.  No tenderness along medial or lateral joint line.  ASSESSMENT/PLAN:   Assessment & Plan Pain in both wrists Differential includes carpal tunnel versus osteoarthritis of wrist.  Patient previously had been told she had arthritis of her left hand, and was given a splint, which helped.  Currently, pain is responding well to Voltaren gel.  Will defer  further workup such as x-ray or nerve studies, but advised patient to follow-up if pain worsens so that we can pursue the studies. -Advised to wear a thumb spica splint nightly -Advised Voltaren gel as needed -Consider hand and wrist x-ray if pain becomes more persistent. Chronic pain of left knee Suspect osteoarthritis from prior injury.  Patient is able to walk with walker.  Discussed options such as imaging, steroid injections and patient prefers conservative management at this time. -Advised as needed Voltaren and Tylenol -If pain is uncontrolled, consider knee x-ray and steroid injections  Lincoln Brigham, MD [facility]

## 2023-07-21 ENCOUNTER — Telehealth: Payer: Self-pay | Admitting: Gastroenterology

## 2023-07-21 NOTE — Telephone Encounter (Signed)
 Patient called stating she has not received her Suprep medication for her colonoscopy on 07/28/2023, requested for her medication to be sent to [facility] on [address].

## 2023-07-24 ENCOUNTER — Encounter: Payer: Self-pay | Admitting: Gastroenterology

## 2023-07-27 ENCOUNTER — Ambulatory Visit (AMBULATORY_SURGERY_CENTER): Payer: 59 | Admitting: Gastroenterology

## 2023-07-27 ENCOUNTER — Encounter: Payer: Self-pay | Admitting: Certified Registered Nurse Anesthetist

## 2023-07-27 ENCOUNTER — Encounter: Payer: 59 | Admitting: Gastroenterology

## 2023-07-27 VITALS — BP 130/77 | HR 63 | Temp 97.9°F | Resp 14 | Wt 171.0 lb

## 2023-07-27 DIAGNOSIS — D128 Benign neoplasm of rectum: Secondary | ICD-10-CM

## 2023-07-27 DIAGNOSIS — K5000 Crohn's disease of small intestine without complications: Secondary | ICD-10-CM | POA: Diagnosis not present

## 2023-07-27 DIAGNOSIS — K621 Rectal polyp: Secondary | ICD-10-CM | POA: Diagnosis not present

## 2023-07-27 DIAGNOSIS — I10 Essential (primary) hypertension: Secondary | ICD-10-CM | POA: Diagnosis not present

## 2023-07-27 DIAGNOSIS — K5140 Inflammatory polyps of colon without complications: Secondary | ICD-10-CM

## 2023-07-27 DIAGNOSIS — K289 Gastrojejunal ulcer, unspecified as acute or chronic, without hemorrhage or perforation: Secondary | ICD-10-CM | POA: Diagnosis not present

## 2023-07-27 MED ORDER — SODIUM CHLORIDE 0.9 % IV SOLN: 500.0000 mL | INTRAVENOUS | Status: AC

## 2023-07-27 NOTE — Patient Instructions (Addendum)
 YOU HAD AN ENDOSCOPIC PROCEDURE TODAY AT THE  ENDOSCOPY CENTER:   Refer to the procedure report that was given to you for any specific questions about what was found during the examination.  If the procedure report does not answer your questions, please call your gastroenterologist to clarify.  If you requested that your care partner not be given the details of your procedure findings, then the procedure report has been included in a sealed envelope for you to review at your convenience later.  YOU SHOULD EXPECT: Some feelings of bloating in the abdomen. Passage of more gas than usual.  Walking can help get rid of the air that was put into your GI tract during the procedure and reduce the bloating. If you had a lower endoscopy (such as a colonoscopy or flexible sigmoidoscopy) you may notice spotting of blood in your stool or on the toilet paper. If you underwent a bowel prep for your procedure, you may not have a normal bowel movement for a few days.  Please Note:  You might notice some irritation and congestion in your nose or some drainage.  This is from the oxygen used during your procedure.  There is no need for concern and it should clear up in a day or so.  SYMPTOMS TO REPORT IMMEDIATELY:  Following lower endoscopy (colonoscopy or flexible sigmoidoscopy):  Excessive amounts of blood in the stool  Significant tenderness or worsening of abdominal pains  Swelling of the abdomen that is new, acute  Fever of 100F or higher   For urgent or emergent issues, a gastroenterologist can be reached at any hour by calling [phone]. Do not use MyChart messaging for urgent concerns.    DIET:  We do recommend a small meal at first, but then you may proceed to your regular diet.  Drink plenty of fluids but you should avoid alcoholic beverages for 24 hours.  MEDICATIONS: Continue present medications. See medication changes below.  Please see handouts given to you by your recovery nurse:  Polyps.  FOLLOW UP: Await pathology results. No recommendation at this time regarding repeat colonoscopy.  PLAN: Change from current therapy of Stelara  to [facility] (Dr. Clayburn office will start working on insurance authorization). Continue Stelara  at current dosing until we are able to begin Skyrixi.  Thank you for allowing us  to provide for your healthcare needs today.  ACTIVITY:  You should plan to take it easy for the rest of today and you should NOT DRIVE or use heavy machinery until tomorrow (because of the sedation medicines used during the test).    FOLLOW UP: Our staff will call the number listed on your records the next business day following your procedure.  We will call around 7:15- 8:00 am to check on you and address any questions or concerns that you may have regarding the information given to you following your procedure. If we do not reach you, we will leave a message.     If any biopsies were taken you will be contacted by phone or by letter within the next 1-3 weeks.  Please call us  at [phone] if you have not heard about the biopsies in 3 weeks.    SIGNATURES/CONFIDENTIALITY: You and/or your care partner have signed paperwork which will be entered into your electronic medical record.  These signatures attest to the fact that that the information above on your After Visit Summary has been reviewed and is understood.  Full responsibility of the confidentiality of this discharge information lies  with you and/or your care-partner.

## 2023-07-27 NOTE — Progress Notes (Signed)
 Pt's states no medical or surgical changes since previsit or office visit.

## 2023-07-27 NOTE — Op Note (Signed)
 [facility] Patient Name: Marissa Blankenship Procedure Date: 07/27/2023 12:32 PM MRN: [id number] Endoscopist: Victory L. Legrand , MD, [id number] Age: 58 Referring MD:  Date of Birth: 1966-03-16 Gender: Female Account #: [id number] Procedure:                Colonoscopy Indications:              Disease activity assessment of Crohn's disease of                            the small bowel                           Prior ileocecal resection, failed Humira  preop                           Mildly ulcerated activity anastomosis on last                            colonoscopy January 2023.                           On Stelara  monotherapy every 4 weeks (which was the                            patient's treatment at the time of last                            colonoscopy). Clinical details in last office                            progress note of October 2024. Elevated fecal                            calprotectin. Medicines:                Monitored Anesthesia Care Procedure:                Pre-Anesthesia Assessment:                           - Prior to the procedure, a History and Physical                            was performed, and patient medications and                            allergies were reviewed. The patient's tolerance of                            previous anesthesia was also reviewed. The risks                            and benefits of the procedure and the sedation                            options and risks were  discussed with the patient.                            All questions were answered, and informed consent                            was obtained. Prior Anticoagulants: The patient has                            taken no anticoagulant or antiplatelet agents. ASA                            Grade Assessment: II - A patient with mild systemic                            disease. After reviewing the risks and benefits,                            the patient was deemed in  satisfactory condition to                            undergo the procedure.                           After obtaining informed consent, the colonoscope                            was passed under direct vision. Throughout the                            procedure, the patient's blood pressure, pulse, and                            oxygen saturations were monitored continuously. The                            CF [id number] #[id number] was introduced through the anus                            and advanced to the the ileocolonic anastomosis.                            The colonoscopy was somewhat difficult due to                            post-surgical anatomy and a redundant colon.                            Successful completion of the procedure was aided by                            using manual pressure and straightening and  shortening the scope to obtain bowel loop                            reduction. The patient tolerated the procedure                            well. The quality of the bowel preparation was                            excellent. The neo-terminal ileum, the rectum and                            ileo-colonic anastomosis were photographed. Scope In: 1:23:41 PM Scope Out: 1:41:21 PM Scope Withdrawal Time: 0 hours 13 minutes 39 seconds  Total Procedure Duration: 0 hours 17 minutes 40 seconds  Findings:                 The perianal and digital rectal examinations were                            normal.                           The neo-terminal ileum appeared normal.                           There was evidence of a prior side-to-side                            ileo-colonic anastomosis in the mid ascending                            colon. This was patent and was characterized by                            ulceration without stricturing. Mucosa between the                            ulcers was normal.. The anastomosis was traversed.                            Degree of ulceration increased from last exam.                           A diminutive polyp was found in the rectum. The                            polyp was sessile. The polyp was removed with a                            cold snare. Resection and retrieval were complete.                           The exam was otherwise without abnormality on  direct and retroflexion views. Complications:            No immediate complications. Estimated Blood Loss:     Estimated blood loss was minimal. Impression:               - The examined portion of the ileum was normal.                           - Patent side-to-side ileo-colonic anastomosis,                            characterized by ulceration.                           - One diminutive polyp in the rectum, removed with                            a cold snare. Resected and retrieved.                           - The examination was otherwise normal on direct                            and retroflexion views. Recommendation:           - Patient has a contact number available for                            emergencies. The signs and symptoms of potential                            delayed complications were discussed with the                            patient. Return to normal activities tomorrow.                            Written discharge instructions were provided to the                            patient.                           - Resume previous diet.                           - Continue present medications.                           - Await pathology results.                           - No recommendation at this time regarding repeat                            colonoscopy.                           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 Continue                            Stelara  at current dosing until we are able to                             begin Skyrizi . Sieara Bremer L. Legrand, MD 07/27/2023 1:50:50 PM This report has been signed electronically.

## 2023-07-27 NOTE — Progress Notes (Signed)
 History and Physical:  This patient presents for endoscopic testing for: Encounter Diagnosis  Name Primary?   Crohn's disease of small intestine without complication [facility]) Yes    58 year old woman well-known to me, here today for evaluation of Crohn's disease activity.  Clinical details are in my last office note of 05/10/2023, and there have been no significant clinical changes since then.  Patient is otherwise without complaints or active issues today.   Past Medical History: Past Medical History:  Diagnosis Date   Acid reflux    Allergy    Anemia    Anxiety    Crohn's disease (HCC)    Depression    Hypertension    IBS (irritable bowel syndrome)    spastic colon   Plantar fasciitis    PRE-ECLAMPSIA 06/18/2007   Qualifier: Diagnosis of  By: Adella MD, Almarie Gores of breath    Small bowel obstruction [facility])      Past Surgical History: Past Surgical History:  Procedure Laterality Date   CESAREAN SECTION     2   COLONOSCOPY     COLONOSCOPY WITH PROPOFOL  N/A 12/02/2015   Procedure: COLONOSCOPY WITH PROPOFOL ;  Surgeon: Victory LITTIE Legrand DOUGLAS, MD;  Location: [facility] ENDOSCOPY;  Service: Gastroenterology;  Laterality: N/A;   ESOPHAGOGASTRODUODENOSCOPY     LAPAROSCOPIC ILEOCECECTOMY N/A 01/31/2017   Procedure: LAPAROSCOPIC LYSIS OF ADHESIONS, ILEOCECECTOMY, STRICTUROPLASTY, BLADDER REPAIR, RIGHT SALPINGECTOMY;  Surgeon: Sheldon Standing, MD;  Location: [facility] ORS;  Service: General;  Laterality: N/A;   UPPER GASTROINTESTINAL ENDOSCOPY     WISDOM TOOTH EXTRACTION      Allergies: No Known Allergies  Outpatient Meds: Current Outpatient Medications  Medication Sig Dispense Refill   amLODipine  (NORVASC ) 10 MG tablet TAKE 1 TABLET BY MOUTH EVERY DAY 90 tablet 0   omeprazole  (PRILOSEC) 40 MG capsule TAKE 1 CAPSULE BY MOUTH EVERY DAY 90 capsule 1   ondansetron  (ZOFRAN ) 8 MG tablet Take 1 tablet (8 mg total) by mouth every 8 (eight) hours as needed for nausea or vomiting. 60 tablet 2    telmisartan  (MICARDIS ) 40 MG tablet TAKE 1 TABLET BY MOUTH EVERYDAY AT BEDTIME 90 tablet 0   prochlorperazine  (COMPAZINE ) 10 MG tablet Take 1 tablet (10 mg total) by mouth every 6 (six) hours as needed for nausea or vomiting. 30 tablet 0   STELARA  90 MG/ML SOSY injection INJECT 1 SYRINGE SUBCUTANEOUSLY  EVERY 4 WEEKS 1 mL 2   Current Facility-Administered Medications  Medication Dose Route Frequency Provider Last Rate Last Admin   0.9 %  sodium chloride  infusion  500 mL Intravenous Continuous Danis, Victory LITTIE III, MD          ___________________________________________________________________ Objective   Exam:  BP (!) 151/92   Pulse (!) 58   Temp 97.9 F (36.6 C) (Temporal)   Wt 171 lb (77.6 kg)   LMP 02/07/2017 (Approximate)   SpO2 100%   BMI 29.35 kg/m   CV: regular , S1/S2 Resp: clear to auscultation bilaterally, normal RR and effort noted GI: soft, no tenderness, with active bowel sounds.   Assessment: Encounter Diagnosis  Name Primary?   Crohn's disease of small intestine without complication (HCC) Yes     Plan: Colonoscopy   The benefits and risks of the planned procedure were described in detail with the patient or (when appropriate) their health care proxy.  Risks were outlined as including, but not limited to, bleeding, infection, perforation, adverse medication reaction leading to cardiac or pulmonary decompensation, pancreatitis (if ERCP).  The limitation of incomplete mucosal visualization was also discussed.  No guarantees or warranties were given.  The patient is appropriate for an endoscopic procedure in the ambulatory setting.   - Victory Brand, MD

## 2023-07-27 NOTE — Progress Notes (Signed)
 Report to PACU, RN, vss, BBS= Clear.

## 2023-07-27 NOTE — Progress Notes (Signed)
 Called to room to assist during endoscopic procedure.  Patient ID and intended procedure confirmed with present staff. Received instructions for my participation in the procedure from the performing physician.

## 2023-07-28 ENCOUNTER — Telehealth: Payer: Self-pay | Admitting: *Deleted

## 2023-07-28 ENCOUNTER — Encounter: Payer: 59 | Admitting: Gastroenterology

## 2023-07-28 NOTE — Telephone Encounter (Signed)
  Follow up Call-     07/27/2023   12:33 PM 07/30/2021   11:08 AM  Call back number  Post procedure Call Back phone  # [phone]  Permission to leave phone message Yes Yes     Patient questions:  Do you have a fever, pain , or abdominal swelling? No. Pain Score  0 *  Have you tolerated food without any problems? Yes.    Have you been able to return to your normal activities? Yes.    Do you have any questions about your discharge instructions: Diet   No. Medications  No. Follow up visit  No.  Do you have questions or concerns about your Care? No.  Actions: * If pain score is 4 or above: No action needed, pain <4.

## 2023-08-01 LAB — SURGICAL PATHOLOGY

## 2023-08-02 ENCOUNTER — Encounter: Payer: Self-pay | Admitting: Gastroenterology

## 2023-08-07 ENCOUNTER — Telehealth: Payer: Self-pay | Admitting: Pharmacy Technician

## 2023-08-07 ENCOUNTER — Other Ambulatory Visit (HOSPITAL_COMMUNITY): Payer: Self-pay

## 2023-08-07 NOTE — Telephone Encounter (Signed)
Dr. Myrtie Neither, please provide Skyrizi dosing. Thanks

## 2023-08-07 NOTE — Telephone Encounter (Signed)
[place] this is a Copy pt.

## 2023-08-07 NOTE — Telephone Encounter (Signed)
Skyrizi dosing:  Induction:  600 mg IV at weeks 0, 4, 8  Maintenance:  360 mg subcutaneous starting at week 12 and every 8 weeks thereafter   Ellwood Dense MD

## 2023-08-07 NOTE — Telephone Encounter (Signed)
-----   Message from Nurse Patty P sent at 08/03/2023  3:18 PM EST ----- Please see message ----- Message ----- From: Sherrilyn Rist, MD Sent: 08/02/2023  10:31 PM EST To: Marisa Sprinkles, RN  Please communicate with the pharmacy prior authorization team to start working on insurance approval for [employer] for this patient's ileal Crohn's disease.  She has had prior Crohn's surgery, previously failed Humira, and has now failed optimal dosing of Stelara.  Ellwood Dense MD

## 2023-08-09 ENCOUNTER — Other Ambulatory Visit: Payer: Self-pay | Admitting: Gastroenterology

## 2023-08-09 ENCOUNTER — Other Ambulatory Visit: Payer: Self-pay | Admitting: Student

## 2023-08-09 ENCOUNTER — Telehealth: Payer: Self-pay | Admitting: Pharmacy Technician

## 2023-08-09 ENCOUNTER — Other Ambulatory Visit (HOSPITAL_COMMUNITY): Payer: Self-pay

## 2023-08-09 NOTE — Telephone Encounter (Signed)
PA has been submitted, and telephone encounter has been created. Please see telephone encounter dated 1.22.25.

## 2023-08-09 NOTE — Telephone Encounter (Signed)
Pharmacy Patient Advocate Encounter   Received notification from Pt Calls Messages that prior authorization for SKYRIZI 360MG  is required/requested.   Insurance verification completed.   The patient is insured through [facility] .   Per test claim: PA required; PA submitted to above mentioned insurance via CoverMyMeds Key/confirmation #/EOC [id number] Status is pending

## 2023-08-22 ENCOUNTER — Other Ambulatory Visit: Payer: Self-pay | Admitting: Gastroenterology

## 2023-08-23 NOTE — Telephone Encounter (Signed)
 [place], is there any update on this PA? Need response on maintenance dose before we can order induction doses at the infusion center. Thanks

## 2023-08-29 ENCOUNTER — Other Ambulatory Visit (HOSPITAL_COMMUNITY): Payer: Self-pay

## 2023-08-29 NOTE — Telephone Encounter (Signed)
Pharmacy Patient Advocate Encounter  Received notification from [facility] that Prior Authorization for [facility] 360MG  has been APPROVED from 1.22.25 to 7.22.25. Ran test claim, Copay is $0. This test claim was processed through [facility]- copay amounts may vary at other pharmacies due to pharmacy/plan contracts, or as the patient moves through the different stages of their insurance plan.   PA #/Case ID/Reference #:  [id number]

## 2023-08-29 NOTE — Telephone Encounter (Signed)
Secure chat sent to [place] to follow up on PA.

## 2023-08-30 ENCOUNTER — Encounter: Payer: Self-pay | Admitting: Gastroenterology

## 2023-08-30 DIAGNOSIS — K5000 Crohn's disease of small intestine without complications: Secondary | ICD-10-CM | POA: Insufficient documentation

## 2023-08-30 MED ORDER — SKYRIZI 360 MG/2.4ML ~~LOC~~ SOCT
360.0000 mg | SUBCUTANEOUS | 5 refills | Status: AC
  Filled 2023-11-09 (×2): qty 2.4, 56d supply, fill #0
  Filled 2024-01-11: qty 2.4, 56d supply, fill #1
  Filled 2024-03-11: qty 2.4, 56d supply, fill #2
  Filled 2024-05-03: qty 2.4, 56d supply, fill #3
  Filled 2024-06-24: qty 2.4, 56d supply, fill #4
  Filled 2024-08-22: qty 2.4, 56d supply, fill #5

## 2023-08-30 NOTE — Addendum Note (Signed)
Addended by: Marisa Sprinkles on: 08/30/2023 10:26 AM   Modules accepted: Orders

## 2023-08-30 NOTE — Addendum Note (Signed)
Addended by: Marisa Sprinkles on: 08/30/2023 07:55 AM   Modules accepted: Orders

## 2023-08-30 NOTE — Telephone Encounter (Signed)
Marissa Blankenship, please see notes below. Skyrizi maintenance dose has been approved. I entered induction therapy plan for Skyrizi (Induction: 600 mg IV at weeks 0, 4, 8). Thanks

## 2023-08-30 NOTE — Telephone Encounter (Signed)
Patient will be scheduled as soon as possible.   Auth Submission: APPROVED Site of care: Site of care: [facility] Payer: UHC DUAL Medication & CPT/J Code(s) submitted: ZOXWRUE A5409 Route of submission (phone, fax, portal): PORTAL Phone # Fax # Auth type: Buy/Bill PB Units/visits requested: 600MG  X 3 DOSES Reference number: [id number] Approval from: 08/30/23 to 10/28/23

## 2023-08-30 NOTE — Telephone Encounter (Signed)
Maintenance: 360 mg subcutaneous starting at week 12 and every 8 weeks thereafter - RX has been sent to [facility].  MyChart message sent to patient.

## 2023-08-30 NOTE — Telephone Encounter (Signed)
Called and spoke with patient regarding approval for Skyrizi maintenance dose approval, and informed her next steps would be hearing from Infusion center for updates on their end for approval and scheduling. Patient was thankful for call, voiced understanding of things discussed, and had no further questions for me.

## 2023-09-01 ENCOUNTER — Ambulatory Visit: Payer: 59

## 2023-09-01 VITALS — BP 126/74 | HR 66 | Temp 98.2°F | Resp 18 | Ht 65.0 in | Wt 167.6 lb

## 2023-09-01 DIAGNOSIS — K5000 Crohn's disease of small intestine without complications: Secondary | ICD-10-CM | POA: Diagnosis not present

## 2023-09-01 MED ORDER — SODIUM CHLORIDE 0.9 % IV SOLN
600.0000 mg | Freq: Once | INTRAVENOUS | Status: AC
  Administered 2023-09-01: 600 mg via INTRAVENOUS
  Filled 2023-09-01: qty 10

## 2023-09-01 NOTE — Patient Instructions (Signed)
Risankizumab Injection What is this medication? RISANKIZUMAB (RIS an KIZ ue mab) treats autoimmune conditions, such as psoriasis, arthritis, Crohn disease, and ulcerative colitis. It works by slowing down an overactive immune system.  It is a monoclonal antibody. This medicine may be used for other purposes; ask your health care provider or pharmacist if you have questions. COMMON BRAND NAME(S): Skyrizi What should I tell my care team before I take this medication? They need to know if you have any of these conditions: Hepatic disease Immune system problems Infection, such as tuberculosis (TB), bacterial, fungal or viral infections Recent or upcoming vaccine An unusual or allergic reaction to risankizumab, other medications, foods, dyes, or preservatives Pregnant or trying to get pregnant Breast-feeding How should I use this medication? This medication is injected into a vein or under the skin. It is given by your care team in a hospital or clinic setting. It may also be given at home. If you get this medication at home, you will be taught how to prepare and give it. Use exactly as directed. Take it as directed on the prescription label. Keep taking it unless your care team tells you to stop. If you use a pen, be sure to take off the outer needle cover before using the dose. It is important that you put your used needles and syringes in a special sharps container. Do not put them in a trash can. If you do not have a sharps container, call your pharmacist or care team to get one. A special MedGuide will be given to you by the pharmacist with each prescription and refill. Be sure to read this information carefully each time. This medication comes with INSTRUCTIONS FOR USE. Ask your pharmacist for directions on how to use this medication. Read the information carefully. Talk to your pharmacist or care team if you have questions. Talk to your care team about the use of this medication in children.  Special care may be needed. Overdosage: If you think you have taken too much of this medicine contact a poison control center or emergency room at once. NOTE: This medicine is only for you. Do not share this medicine with others. What if I miss a dose? It is important not to miss any doses. Talk to your care team about what to do if you miss a dose. What may interact with this medication? Do not take this medication with any of the following: Live vaccines This list may not describe all possible interactions. Give your health care provider a list of all the medicines, herbs, non-prescription drugs, or dietary supplements you use. Also tell them if you smoke, drink alcohol, or use illegal drugs. Some items may interact with your medicine. What should I watch for while using this medication? Visit your care team for regular checks on your progress. Tell your care team if your symptoms do not start to get better or if they get worse. You will be tested for tuberculosis (TB) before you start this medication. If your care team prescribes any medication for TB, you should start taking the TB medication before starting this medication. Make sure to finish the full course of TB medication. This medication may increase your risk of getting an infection. Call your care team for advice if you get a fever, chills, sore throat, or other symptoms of a cold or flu. Do not treat yourself. Try to avoid being around people who are sick. This medication can decrease the response to a vaccine. If you  need to get vaccinated, tell your care team if you have received this medication. Extra booster doses may be needed. Talk to your care team to see if a different vaccination schedule is needed. What side effects may I notice from receiving this medication? Side effects that you should report to your care team as soon as possible: Allergic reactions--skin rash, itching, hives, swelling of the face, lips, tongue, or  throat Infection--fever, chills, cough, sore throat, wounds that don't heal, pain or trouble when passing urine, general feeling of discomfort or being unwell Liver injury--right upper belly pain, loss of appetite, nausea, light-colored stool, dark yellow or brown urine, yellowing skin or eyes, unusual weakness or fatigue Side effects that usually do not require medical attention (report to your care team if they continue or are bothersome): Fatigue Headache Pain, redness, or irritation at injection site Runny or stuffy nose Sore throat This list may not describe all possible side effects. Call your doctor for medical advice about side effects. You may report side effects to FDA at 1-800-FDA-1088. Where should I keep my medication? Keep out of the reach of children and pets. Store in a refrigerator. Do not freeze. Protect from light. Keep it in the original carton until you are ready to take it. See product for storage information. Each product may have different instructions. Remove the dose from the carton about 30 to 45 minutes before it is time for you to take it. Get rid of any unused medication after the expiration date. To get rid of medications that are no longer needed or have expired: Take the medication to a medication take-back program. Check with your pharmacy or law enforcement to find a location. If you cannot return the medication, ask your pharmacist or care team how to get rid of this medication safely. NOTE: This sheet is a summary. It may not cover all possible information. If you have questions about this medicine, talk to your doctor, pharmacist, or health care provider.  2024 Elsevier/Gold Standard (2023-06-16 00:00:00)

## 2023-09-01 NOTE — Progress Notes (Signed)
Diagnosis: Crohn's Disease  Provider:  Chilton Greathouse MD  Procedure: IV Infusion  IV Type: Peripheral, IV Location: R Antecubital  Skyrizi (risankizumab-rzaa), Dose: 600 mg  Infusion Start Time: 1035  Infusion Stop Time: 1135  Post Infusion IV Care: Observation period completed and Peripheral IV Discontinued  Discharge: Condition: Good, Destination: Home . AVS Provided  Performed by:  Adriana Mccallum, RN

## 2023-09-11 ENCOUNTER — Other Ambulatory Visit: Payer: Self-pay | Admitting: Student

## 2023-09-11 DIAGNOSIS — Z1231 Encounter for screening mammogram for malignant neoplasm of breast: Secondary | ICD-10-CM

## 2023-09-21 ENCOUNTER — Ambulatory Visit: Admitting: Family Medicine

## 2023-09-21 ENCOUNTER — Encounter: Payer: Self-pay | Admitting: Family Medicine

## 2023-09-21 VITALS — BP 132/78 | HR 56 | Ht 65.0 in | Wt 169.8 lb

## 2023-09-21 DIAGNOSIS — I10 Essential (primary) hypertension: Secondary | ICD-10-CM | POA: Diagnosis not present

## 2023-09-21 DIAGNOSIS — K50919 Crohn's disease, unspecified, with unspecified complications: Secondary | ICD-10-CM | POA: Diagnosis not present

## 2023-09-21 DIAGNOSIS — R519 Headache, unspecified: Secondary | ICD-10-CM

## 2023-09-21 NOTE — Progress Notes (Addendum)
    SUBJECTIVE:   CHIEF COMPLAINT / HPI:   Headaches and nausea- started Tuesday. Started taking skyrizi 2/14- had a stomach ache 2-3 days after but got better. Isn't sure if this is related to her skyrizi.   Headache gets better with tylenol but comes back after medication wears off. Headache is more in her face and forehead. Does not feel congested or have pain in sinuses.   Blood pressure yesterday 151/91.  Amlodipine 10 mg Telmisartan 40 mg  - took today Nausea is not as bad, takes deep breaths and gets fresh air and that helps the nausea.   PERTINENT  PMH / PSH:  HTN Crohns disease s/p ileocecectomy    OBJECTIVE:   BP 132/78   Pulse (!) 56   Ht 5\' 5"  (1.651 m)   Wt 169 lb 12.8 oz (77 kg)   LMP 02/07/2017 (Approximate)   SpO2 100%   BMI 28.26 kg/m      09/21/2023    9:08 AM 09/21/2023    8:44 AM 09/01/2023   12:04 PM  Vitals with BMI  Height  5\' 5"    Weight  169 lbs 13 oz   BMI  28.26   Systolic 132 147 536  Diastolic 78 93 74  Pulse  56     General: A&O, NAD HEENT: No sign of trauma, nasal turbinates swollen bilaterally, oropharynx with mild erythema and cobble stoning. Cervical LDA noted, lymph nodes 1 cm in diameter.  Cardiac: RRR, no m/r/g Respiratory: CTAB, normal WOB, no w/c/r GI: Soft, NTTP, non-distended  Extremities: NTTP, no peripheral edema. Neuro: Normal gait, moves all four extremities appropriately. Psych: Appropriate mood and affect   ASSESSMENT/PLAN:   Assessment & Plan Hypertension, unspecified type BP initially elevated to 147/93, but normalized to 132/78 at recheck. Will not adjust BP regimen at this this time. Patient to schedule follow up for routine follow up with PCP.   Acute nonintractable headache, unspecified headache type Patient describing headache for last two days. Given PE, likely in setting of acute viral URI. Headache well controlled with tylenol. Will have patient follow up as needed.   Routine Maintenance - will order  BMP and CBC as patient has not had checked in some time. Patient to schedule follow up with PCP.   Hal Morales, MD [facility]

## 2023-09-21 NOTE — Patient Instructions (Addendum)
 It was great to see you today! Thank you for choosing [facility] for your primary care. Marissa Blankenship was seen for concerns of headache.  Today we addressed: I think your headache may be related to an ongoing viral infection or irritation. I would continue treating with tylenol as needed. If you are not feeling better in one week, please return for follow up given your increased risk of infection being on Skyrizi.  Your BP was normal after recheck today, I will not adjust your BP medications today. You do need a follow up with your PCP for routine follow up. Please schedule this at the front before you check out.   If you haven't already, sign up for My Chart to have easy access to your labs results, and communication with your primary care physician.  We are checking some labs today. If they are abnormal, I will call you. If they are normal, I will send you a MyChart message (if it is active) or a letter in the mail. If you do not hear about your labs in the next 2 weeks, please call the office.   You should return to our clinic as soon as possible for PCP follow up   I recommend that you always bring your medications to each appointment as this makes it easy to ensure you are on the correct medications and helps [place] not miss refills when you need them.  Please arrive 15 minutes before your appointment to ensure smooth check in process.  We appreciate your efforts in making this happen.  Please call the clinic at [phone] if your symptoms worsen or you have any concerns.  Thank you for allowing me to participate in your care, Hal Morales, MD 09/21/2023, 9:20 AM PGY-1, [facility]

## 2023-09-21 NOTE — Assessment & Plan Note (Addendum)
 BP initially elevated to 147/93, but normalized to 132/78 at recheck. Will not adjust BP regimen at this this time. Patient to schedule follow up for routine follow up with PCP.

## 2023-09-21 NOTE — Addendum Note (Signed)
 Addended byPenne Lash on: 09/21/2023 09:23 AM   Modules accepted: Level of Service

## 2023-09-22 ENCOUNTER — Encounter: Payer: Self-pay | Admitting: Family Medicine

## 2023-09-22 LAB — CBC
Hematocrit: 39.9 % (ref 34.0–46.6)
Hemoglobin: 13 g/dL (ref 11.1–15.9)
MCH: 29.4 pg (ref 26.6–33.0)
MCHC: 32.6 g/dL (ref 31.5–35.7)
MCV: 90 fL (ref 79–97)
Platelets: 246 10*3/uL (ref 150–450)
RBC: 4.42 x10E6/uL (ref 3.77–5.28)
RDW: 13.8 % (ref 11.7–15.4)
WBC: 4.8 10*3/uL (ref 3.4–10.8)

## 2023-09-22 LAB — BASIC METABOLIC PANEL
BUN/Creatinine Ratio: 16 (ref 9–23)
BUN: 10 mg/dL (ref 6–24)
CO2: 24 mmol/L (ref 20–29)
Calcium: 9.1 mg/dL (ref 8.7–10.2)
Chloride: 103 mmol/L (ref 96–106)
Creatinine, Ser: 0.63 mg/dL (ref 0.57–1.00)
Glucose: 81 mg/dL (ref 70–99)
Potassium: 3.5 mmol/L (ref 3.5–5.2)
Sodium: 142 mmol/L (ref 134–144)
eGFR: 105 mL/min/{1.73_m2} (ref >59)

## 2023-09-27 ENCOUNTER — Encounter: Payer: Self-pay | Admitting: Pulmonary Disease

## 2023-09-29 ENCOUNTER — Ambulatory Visit (INDEPENDENT_AMBULATORY_CARE_PROVIDER_SITE_OTHER): Payer: 59

## 2023-09-29 VITALS — BP 156/78 | HR 59 | Temp 98.3°F | Resp 14 | Ht 65.0 in | Wt 169.8 lb

## 2023-09-29 DIAGNOSIS — K5000 Crohn's disease of small intestine without complications: Secondary | ICD-10-CM

## 2023-09-29 MED ORDER — SODIUM CHLORIDE 0.9 % IV SOLN
600.0000 mg | Freq: Once | INTRAVENOUS | Status: AC
  Administered 2023-09-29: 600 mg via INTRAVENOUS
  Filled 2023-09-29: qty 10

## 2023-09-29 NOTE — Progress Notes (Signed)
 Diagnosis: Crohn's Disease  Provider:  Chilton Greathouse MD  Procedure: IV Infusion  IV Type: Peripheral, IV Location: R Antecubital  Skyrizi (risankizumab-rzaa), Dose: 600 mg  Infusion Start Time: 0942  Infusion Stop Time: 1050  Post Infusion IV Care: Peripheral IV Discontinued  Discharge: Condition: Good, Destination: Home . AVS Declined  Performed by:  Garnette Czech, RN

## 2023-10-03 ENCOUNTER — Ambulatory Visit: Payer: 59

## 2023-10-18 ENCOUNTER — Encounter: Payer: Self-pay | Admitting: Student

## 2023-10-18 ENCOUNTER — Ambulatory Visit: Admitting: Student

## 2023-10-18 VITALS — BP 140/80 | HR 60 | Ht 65.0 in | Wt 169.4 lb

## 2023-10-18 DIAGNOSIS — I10 Essential (primary) hypertension: Secondary | ICD-10-CM | POA: Diagnosis not present

## 2023-10-18 MED ORDER — TELMISARTAN-AMLODIPINE 80-10 MG PO TABS: 1.0000 | ORAL_TABLET | Freq: Every day | ORAL | 2 refills | Status: DC

## 2023-10-18 NOTE — Assessment & Plan Note (Signed)
 Need for more control.  Asymptomatic.  Requesting combo pill. - Amlodipine-telmisartan 10-80 mg tablet daily - Follow-up in 2-4 weeks

## 2023-10-18 NOTE — Progress Notes (Signed)
    SUBJECTIVE:   CHIEF COMPLAINT / HPI:   HTN Patient follow-up for hypertension.  Blood pressure 140/80 today on repeat.  She keeps a blood pressure log, blood pressures consistently 140-150 systolic at home.  She did not take her medications this morning.  Currently take amlodipine 10 mg and telmisartan 40 mg.  She is interested in combo pill to reduce pill burden.  Health maintenance Due for Tdap, patient will get at pharmacy due to insurance Due for Medicare annual wellness visit, patient agreeable. Mammogram appointment is tomorrow  OBJECTIVE:   BP (!) 140/80   Pulse 60   Ht 5\' 5"  (1.651 m)   Wt 169 lb 6 oz (76.8 kg)   LMP 02/07/2017 (Approximate)   SpO2 99%   BMI 28.19 kg/m    General: NAD, pleasant Cardio: RRR, no MRG. Cap Refill <2s. Respiratory: CTAB, normal wob on RA Skin: Warm and dry  ASSESSMENT/PLAN:   Assessment & Plan Hypertension, unspecified type Need for more control.  Asymptomatic.  Requesting combo pill. - Amlodipine-telmisartan 10-80 mg tablet daily - Follow-up in 2-4 weeks   Tiffany Kocher, DO [facility]

## 2023-10-18 NOTE — Patient Instructions (Addendum)
 It was great to see you! Thank you for allowing me to participate in your care!   I recommend that you always bring your medications to each appointment as this makes it easy to ensure we are on the correct medications and helps [place] not miss when refills are needed.  Our plans for today:  - Take 1 tablet of Amlodipine-Telmisartan combo pill daily - Follow-up in 2-4 weeks for BP recheck - You will receive a call for your medicare annual wellness visit    Take care and seek immediate care sooner if you develop any concerns. Please remember to show up 15 minutes before your scheduled appointment time!  Tiffany Kocher, DO [facility]

## 2023-10-19 ENCOUNTER — Ambulatory Visit
Admission: RE | Admit: 2023-10-19 | Discharge: 2023-10-19 | Disposition: A | Discharge status: Home / Self Care | Source: Ambulatory Visit | Attending: *Deleted | Admitting: *Deleted

## 2023-10-19 DIAGNOSIS — Z1231 Encounter for screening mammogram for malignant neoplasm of breast: Secondary | ICD-10-CM | POA: Diagnosis not present

## 2023-10-27 ENCOUNTER — Ambulatory Visit: Payer: 59

## 2023-10-27 VITALS — BP 150/89 | HR 50 | Temp 98.1°F | Resp 16 | Ht 64.0 in | Wt 168.6 lb

## 2023-10-27 DIAGNOSIS — K5000 Crohn's disease of small intestine without complications: Secondary | ICD-10-CM | POA: Diagnosis not present

## 2023-10-27 MED ORDER — SODIUM CHLORIDE 0.9 % IV SOLN
600.0000 mg | Freq: Once | INTRAVENOUS | Status: AC
  Administered 2023-10-27: 600 mg via INTRAVENOUS
  Filled 2023-10-27: qty 10

## 2023-10-27 NOTE — Progress Notes (Signed)
 Diagnosis: Crohn's Disease  Provider:  Chilton Greathouse MD  Procedure: IV Infusion  IV Type: Peripheral, IV Location: R Antecubital  Skyrizi (risankizumab-rzaa), Dose: 600 mg  Infusion Start Time: 0907  Infusion Stop Time: 1015  Post Infusion IV Care: Peripheral IV Discontinued  Discharge: Condition: Good, Destination: Home . AVS Declined  Performed by:  Rico Ala, LPN

## 2023-11-02 ENCOUNTER — Ambulatory Visit: Payer: 59

## 2023-11-02 VITALS — Ht 64.0 in | Wt 170.0 lb

## 2023-11-02 DIAGNOSIS — Z0000 Encounter for general adult medical examination without abnormal findings: Secondary | ICD-10-CM

## 2023-11-02 NOTE — Progress Notes (Signed)
 I have reviewed the patient's updated history, problem list, medications and allergies. I have reviewed the AWV provider's notations.

## 2023-11-02 NOTE — Patient Instructions (Addendum)
 Ms. Salmi , Thank you for taking time to come for your Medicare Wellness Visit. I appreciate your ongoing commitment to your health goals. Please review the following plan we discussed and let me know if I can assist you in the future.   Referrals/Orders/Follow-Ups/Clinician Recommendations: Keep maintaining your health by keeping your appointments with Dr. Telford Feather and any specialists that you may see.  Call us  if you need anything.  Have a great year!!!!  This is a list of the screening recommended for you and due dates:  Health Maintenance  Topic Date Due   Zoster (Shingles) Vaccine (2 of 2) 10/10/2019   DTaP/Tdap/Td vaccine (3 - Td or Tdap) 07/05/2021   Flu Shot  02/16/2024   Medicare Annual Wellness Visit  11/01/2024   Mammogram  10/18/2025   Pap with HPV screening  01/05/2026   Colon Cancer Screening  07/26/2033   Hepatitis C Screening  Completed   HIV Screening  Completed   Pneumococcal Vaccination  Aged Out   HPV Vaccine  Aged Out   Meningitis B Vaccine  Aged Out   COVID-19 Vaccine  Discontinued    Advanced directives: (Declined) Advance directive discussed with you today. Even though you declined this today, please call our office should you change your mind, and we can give you the proper paperwork for you to fill out.  Next Medicare Annual Wellness Visit scheduled for next year: Yes, It was nice speaking with you today! Your next Annual Wellness Visit is scheduled for 11/04/2024 at 8:30 a.m. via PHONE CALL. If you need to reschedule or cancel, please call [phone].

## 2023-11-02 NOTE — Progress Notes (Signed)
 Because this visit was a virtual/telehealth visit,  certain criteria was not obtained, such a blood pressure, CBG if applicable, and timed get up and go. Any medications not marked as "taking" were not mentioned during the medication reconciliation part of the visit. Any vitals not documented were not able to be obtained due to this being a telehealth visit or patient was unable to self-report a recent blood pressure reading due to a lack of equipment at home via telehealth. Vitals that have been documented are verbally provided by the patient.   Subjective:   Marissa Blankenship is a 58 y.o. who presents for a Medicare Wellness preventive visit.  Visit Complete: Virtual I connected with  Avon Gully on 11/02/23 by a audio enabled telemedicine application and verified that I am speaking with the correct person using two identifiers.  Patient Location: Home  Provider Location: Office/Clinic  I discussed the limitations of evaluation and management by telemedicine. The patient expressed understanding and agreed to proceed.  Vital Signs: Because this visit was a virtual/telehealth visit, some criteria may be missing or patient reported. Any vitals not documented were not able to be obtained and vitals that have been documented are patient reported.  VideoDeclined- This patient declined Librarian, academic. Therefore the visit was completed with audio only.  Persons Participating in Visit: Patient.  AWV Questionnaire: No: Patient Medicare AWV questionnaire was not completed prior to this visit.  Cardiac Risk Factors include: advanced age (>71men, >21 women);sedentary lifestyle;family history of premature cardiovascular disease;hypertension     Objective:    Today's Vitals   11/02/23 0833  Weight: 170 lb (77.1 kg)  Height: 5\' 4"  (1.626 m)  PainSc: 0-No pain   Body mass index is 29.18 kg/m.     11/02/2023    8:54 AM 10/18/2023    9:09 AM 09/21/2023    8:46 AM  12/22/2022    4:07 PM 10/06/2022    8:48 AM 08/12/2022   10:02 AM 01/04/2022    3:16 PM  Advanced Directives  Does Patient Have a Medical Advance Directive? No No No No No No No  Would patient like information on creating a medical advance directive? No - Patient declined No - Patient declined No - Patient declined No - Patient declined No - Patient declined No - Patient declined No - Patient declined    Current Medications (verified) Outpatient Encounter Medications as of 11/02/2023  Medication Sig   omeprazole (PRILOSEC) 40 MG capsule TAKE 1 CAPSULE BY MOUTH EVERY DAY   ondansetron (ZOFRAN) 8 MG tablet Take 1 tablet (8 mg total) by mouth every 8 (eight) hours as needed for nausea or vomiting.   prochlorperazine (COMPAZINE) 10 MG tablet Take 1 tablet (10 mg total) by mouth every 6 (six) hours as needed for nausea or vomiting.   Risankizumab-rzaa (SKYRIZI) 360 MG/2.4ML SOCT Inject 360 mg into the skin every 8 (eight) weeks. starting at week 12 (4 weeks after last infusion) and every 8 weeks thereafter   Telmisartan-amLODIPine 80-10 MG TABS Take 1 tablet by mouth daily.   No facility-administered encounter medications on file as of 11/02/2023.    Allergies (verified) Patient has no known allergies.   History: Past Medical History:  Diagnosis Date   Acid reflux    Allergy    Anemia    Anxiety    Crohn's disease (HCC)    Depression    Hypertension    IBS (irritable bowel syndrome)    spastic colon  Plantar fasciitis    PRE-ECLAMPSIA 06/18/2007   Qualifier: Diagnosis of  By: Delrae Alfred MD, Maryruth Eve of breath    Small bowel obstruction [facility])    Past Surgical History:  Procedure Laterality Date   CESAREAN SECTION     2   COLONOSCOPY     COLONOSCOPY WITH PROPOFOL N/A 12/02/2015   Procedure: COLONOSCOPY WITH PROPOFOL;  Surgeon: Sherrilyn Rist, MD;  Location: [facility] ENDOSCOPY;  Service: Gastroenterology;  Laterality: N/A;   ESOPHAGOGASTRODUODENOSCOPY     LAPAROSCOPIC  ILEOCECECTOMY N/A 01/31/2017   Procedure: LAPAROSCOPIC LYSIS OF ADHESIONS, ILEOCECECTOMY, STRICTUROPLASTY, BLADDER REPAIR, RIGHT SALPINGECTOMY;  Surgeon: Karie Soda, MD;  Location: [facility] ORS;  Service: General;  Laterality: N/A;   UPPER GASTROINTESTINAL ENDOSCOPY     WISDOM TOOTH EXTRACTION     Family History  Problem Relation Age of Onset   Hypertension Mother 64   Hypertension Father 19   Crohn's disease Sister    Diabetes Paternal Grandmother    Hypertension Brother    Colon cancer Neg Hx    Pancreatic cancer Neg Hx    Stomach cancer Neg Hx    Esophageal cancer Neg Hx    Rectal cancer Neg Hx    BRCA 1/2 Neg Hx    Breast cancer Neg Hx    Social History   Socioeconomic History   Marital status: Divorced    Spouse name: Not on file   Number of children: 3   Years of education: 14   Highest education level: Associate degree: academic program  Occupational History   Occupation: disability   Tobacco Use   Smoking status: Former    Current packs/day: 0.00    Types: Cigarettes    Quit date: 04/13/2022    Years since quitting: 1.5   Smokeless tobacco: Never  Vaping Use   Vaping status: Never Used  Substance and Sexual Activity   Alcohol use: Yes    Comment: Occassionally    Drug use: No   Sexual activity: Yes    Partners: Male  Other Topics Concern   Not on file  Social History Narrative   Patient lives in [place] with her two sons.    Patient has a daughter who she is close with.    Patient enjoys walking for exercise.   Patient enjoys spending time with her grandchildren.       Social Drivers of Corporate investment banker Strain: Low Risk  (11/02/2023)   Overall Financial Resource Strain (CARDIA)    Difficulty of Paying Living Expenses: Not hard at all  Food Insecurity: No Food Insecurity (11/02/2023)   Hunger Vital Sign    Worried About Running Out of Food in the Last Year: Never true    Ran Out of Food in the Last Year: Never true  Transportation Needs: No  Transportation Needs (11/02/2023)   PRAPARE - Administrator, Civil Service (Medical): No    Lack of Transportation (Non-Medical): No  Physical Activity: Inactive (11/02/2023)   Exercise Vital Sign    Days of Exercise per Week: 0 days    Minutes of Exercise per Session: 0 min  Stress: No Stress Concern Present (11/02/2023)   [employer] of Occupational Health - Occupational Stress Questionnaire    Feeling of Stress : Not at all  Social Connections: Moderately Isolated (11/02/2023)   Social Connection and Isolation Panel [NHANES]    Frequency of Communication with Friends and Family: More than three times a week  Frequency of Social Gatherings with Friends and Family: More than three times a week    Attends Religious Services: More than 4 times per year    Active Member of [employer] or Organizations: No    Attends Engineer, structural: Never    Marital Status: Divorced    Tobacco Counseling Counseling given: Not Answered    Clinical Intake:  Pre-visit preparation completed: Yes  Pain : No/denies pain Pain Score: 0-No pain     BMI - recorded: 29.18 Nutritional Status: BMI 25 -29 Overweight Nutritional Risks: None Diabetes: No  Lab Results  Component Value Date   HGBA1C 5.2 01/05/2021   HGBA1C 5.4 01/05/2021   HGBA1C 5.1 01/31/2017     How often do you need to have someone help you when you read instructions, pamphlets, or other written materials from your doctor or pharmacy?: 1 - Never What is the last grade level you completed in school?: ASSOCIATE'S DEGREE  Interpreter Needed?: No  Information entered by :: Samona Chihuahua N. Tymira Horkey, LPN.   Activities of Daily Living     11/02/2023    8:36 AM  In your present state of health, do you have any difficulty performing the following activities:  Hearing? 0  Vision? 0  Difficulty concentrating or making decisions? 0  Comment BSE: READING, PUZZLES, GAMES ON PHONE  Walking or climbing stairs? 0   Dressing or bathing? 0  Doing errands, shopping? 0  Preparing Food and eating ? N  Using the Toilet? N  In the past six months, have you accidently leaked urine? N  Do you have problems with loss of bowel control? N  Managing your Medications? N  Managing your Finances? N  Housekeeping or managing your Housekeeping? N    Patient Care Team: Chilton Greathouse, MD as PCP - General (Pulmonary Disease) Romie Levee, MD as Consulting Physician (General Surgery) Danis, Andreas Blower, MD as Consulting Physician (Gastroenterology) Karie Soda, MD as Consulting Physician (General Surgery) Tomma Lightning, MD as Consulting Physician (Pulmonary Disease) Mateo Flow, MD as Consulting Physician (Ophthalmology)  Indicate any recent Medical Services you may have received from other than [facility] providers in the past year (date may be approximate).     Assessment:   This is a routine wellness examination for Shylo.  Hearing/Vision screen Hearing Screening - Comments:: Denies hearing difficulties.  Vision Screening - Comments:: Wears rx glasses - up to date with routine eye exams with Mateo Flow, MD.    Goals Addressed             This Visit's Progress    Patient Stated       11/02/2023: My goal is to start exercising so that I can control my blood pressure and get off some of my medications.       Depression Screen     11/02/2023    8:56 AM 10/18/2023    9:09 AM 09/21/2023    8:45 AM 06/26/2023    9:13 AM 12/22/2022    4:13 PM 12/22/2022    4:07 PM 10/06/2022    8:35 AM  PHQ 2/9 Scores  PHQ - 2 Score 1 1 1 1   1   PHQ- 9 Score 5 5 5 3      Exception Documentation     Patient refusal Patient refusal     Fall Risk     11/02/2023    8:35 AM 10/06/2022    8:36 AM 12/15/2021    3:44 PM 12/09/2020   10:19 AM  06/03/2019    3:57 PM  Fall Risk   Falls in the past year? 0 0 0 0 0  Number falls in past yr: 0 0 0    Injury with Fall? 0 0 0    Risk for fall due to : No Fall Risks No  Fall Risks  No Fall Risks   Follow up Falls prevention discussed;Falls evaluation completed Falls evaluation completed  Falls prevention discussed     MEDICARE RISK AT HOME:  Medicare Risk at Home Any stairs in or around the home?: No If so, are there any without handrails?: No Home free of loose throw rugs in walkways, pet beds, electrical cords, etc?: Yes Adequate lighting in your home to reduce risk of falls?: Yes Life alert?: No Use of a cane, walker or w/c?: No Grab bars in the bathroom?: No Shower chair or bench in shower?: No Elevated toilet seat or a handicapped toilet?: No  TIMED UP AND GO:  Was the test performed?  No  Cognitive Function: 6CIT completed    11/02/2023    8:37 AM  MMSE - Mini Mental State Exam  Not completed: Unable to complete        11/02/2023    8:53 AM 10/06/2022    8:38 AM 12/09/2020   10:20 AM  6CIT Screen  What Year? 0 points 0 points 0 points  What month? 0 points 0 points 0 points  What time? 0 points 0 points 0 points  Count back from 20 0 points 0 points 0 points  Months in reverse 0 points 0 points 0 points  Repeat phrase 0 points 0 points 0 points  Total Score 0 points 0 points 0 points    Immunizations Immunization History  Administered Date(s) Administered   Hep A / Hep B 02/17/2016, 03/22/2016, 08/25/2016   Influenza Whole 05/25/2010   Influenza, Seasonal, Injecte, Preservative Fre 05/10/2017, 05/22/2023   Influenza,inj,Quad PF,6+ Mos 12/04/2015, 04/16/2018, 04/08/2019, 05/07/2021, 04/11/2022   Influenza-Unspecified 04/03/2020   PFIZER(Purple Top)SARS-COV-2 Vaccination 10/29/2019, 11/19/2019, 03/10/2020   Pfizer Covid-19 Vaccine Bivalent Booster 45yrs & up 05/07/2021   Pfizer(Comirnaty)Fall Seasonal Vaccine 12 years and older 09/08/2022, 05/22/2023   Pneumococcal Conjugate-13 03/05/2019   Pneumococcal Polysaccharide-23 12/04/2015   Rho (D) Immune Globulin 07/06/2011   Td 07/18/1998   Tdap 07/06/2011   Zoster  Recombinant(Shingrix) 05/14/2019   Zoster, Live 08/15/2019   Zoster, Unspecified 08/15/2019    Screening Tests Health Maintenance  Topic Date Due   Zoster Vaccines- Shingrix (2 of 2) 10/10/2019   DTaP/Tdap/Td (3 - Td or Tdap) 07/05/2021   INFLUENZA VACCINE  02/16/2024   Medicare Annual Wellness (AWV)  11/01/2024   MAMMOGRAM  10/18/2025   Cervical Cancer Screening (HPV/Pap Cotest)  01/05/2026   Colonoscopy  07/26/2033   Hepatitis C Screening  Completed   HIV Screening  Completed   Pneumococcal Vaccine 53-28 Years old  Aged Out   HPV VACCINES  Aged Out   Meningococcal B Vaccine  Aged Out   COVID-19 Vaccine  Discontinued    Health Maintenance  Health Maintenance Due  Topic Date Due   Zoster Vaccines- Shingrix (2 of 2) 10/10/2019   DTaP/Tdap/Td (3 - Td or Tdap) 07/05/2021   Health Maintenance Items Addressed: Yes; Patient is aware.   Additional Screening:  Vision Screening: Recommended annual ophthalmology exams for early detection of glaucoma and other disorders of the eye.  Dental Screening: Recommended annual dental exams for proper oral hygiene  Community Resource Referral / Chronic Care Management:  CRR required this visit?  No   CCM required this visit?  No     Plan:     I have personally reviewed and noted the following in the patient's chart:   Medical and social history Use of alcohol, tobacco or illicit drugs  Current medications and supplements including opioid prescriptions. Patient is not currently taking opioid prescriptions. Functional ability and status Nutritional status Physical activity Advanced directives List of other physicians Hospitalizations, surgeries, and ER visits in previous 12 months Vitals Screenings to include cognitive, depression, and falls Referrals and appointments  In addition, I have reviewed and discussed with patient certain preventive protocols, quality metrics, and best practice recommendations. A written  personalized care plan for preventive services as well as general preventive health recommendations were provided to patient.     Margette Sheldon, LPN   02/25/9146   After Visit Summary: (MyChart) Due to this being a telephonic visit, the after visit summary with patients personalized plan was offered to patient via MyChart   Notes: Nothing significant to report at this time.

## 2023-11-09 ENCOUNTER — Other Ambulatory Visit: Payer: Self-pay

## 2023-11-09 ENCOUNTER — Other Ambulatory Visit (HOSPITAL_COMMUNITY): Payer: Self-pay

## 2023-11-09 NOTE — Progress Notes (Signed)
 Specialty Pharmacy Initiation Note   Marissa Blankenship is a 58 y.o. female who will be followed by the specialty pharmacy service for RxSp Crohn's Disease    Review of administration, indication, effectiveness, safety, potential side effects, storage/disposable, and missed dose instructions occurred today for patient's specialty medication(s) Risankizumab -rzaa (Skyrizi )     Patient/Caregiver did not have any additional questions or concerns.   Patient's therapy is appropriate to: Initiate    Goals Addressed             This Visit's Progress    Stabilization of disease       Patient is initiating therapy. Patient will maintain adherence         Will follow up in 6 months, patient was encouraged to call if any questions or concerns arise.   Malachi Screws Specialty Pharmacist

## 2023-11-09 NOTE — Progress Notes (Signed)
 Initial fill has been completed in [place].

## 2023-11-09 NOTE — Progress Notes (Signed)
 Specialty Pharmacy Initial Fill Coordination Note  Marissa Blankenship is a 58 y.o. female contacted today regarding initial fill of specialty medication(s) Risankizumab -rzaa (Skyrizi )   Patient requested Delivery   Delivery date: 11/14/23   Verified address: [address]   [place] [address]   Medication will be filled on 11/13/23.   Patient is aware of 0 copayment.

## 2023-11-13 ENCOUNTER — Other Ambulatory Visit: Payer: Self-pay

## 2023-11-18 ENCOUNTER — Other Ambulatory Visit (HOSPITAL_BASED_OUTPATIENT_CLINIC_OR_DEPARTMENT_OTHER): Payer: Self-pay

## 2023-11-20 ENCOUNTER — Other Ambulatory Visit: Payer: Self-pay

## 2023-11-20 ENCOUNTER — Other Ambulatory Visit (HOSPITAL_COMMUNITY): Payer: Self-pay

## 2023-11-20 NOTE — Progress Notes (Signed)
 [facility] left patient a voicemail in regards to the delay of Skyrizi . New delivery date is 11/21/23. Patient was advised to call us  back with any further questions.

## 2023-11-21 ENCOUNTER — Telehealth: Payer: Self-pay | Admitting: Gastroenterology

## 2023-11-21 NOTE — Telephone Encounter (Signed)
 Inbound call from patient, would like to know self injections begin, she states she was given two different dates and wants to clarify when she should begin.

## 2023-11-21 NOTE — Telephone Encounter (Addendum)
 Spoke with the patient. 3rd infusion was 10/27/23. She will begin the OBI week 12 which she agrees will be 11/24/23. She has received her OBI. Guided her to the Skyrizi  online video that demonstrates how to use the OBI. Encouraged to call nurse ambassador or [facility] if she has questions or concerns.

## 2023-11-27 NOTE — Progress Notes (Signed)
 I have reviewed the patient's updated history, problem list, medications and allergies. I have reviewed the AWV provider's notations.

## 2023-11-28 ENCOUNTER — Other Ambulatory Visit (HOSPITAL_COMMUNITY): Payer: Self-pay

## 2023-12-26 ENCOUNTER — Ambulatory Visit: Attending: Family Medicine

## 2023-12-26 ENCOUNTER — Ambulatory Visit: Admitting: Student

## 2023-12-26 VITALS — BP 128/75 | HR 62 | Ht 64.0 in | Wt 164.5 lb

## 2023-12-26 DIAGNOSIS — R002 Palpitations: Secondary | ICD-10-CM | POA: Diagnosis not present

## 2023-12-26 DIAGNOSIS — F4320 Adjustment disorder, unspecified: Secondary | ICD-10-CM | POA: Diagnosis not present

## 2023-12-26 MED ORDER — HYDROXYZINE HCL 25 MG PO TABS: 25.0000 mg | ORAL_TABLET | Freq: Three times a day (TID) | ORAL | 0 refills | Status: DC | PRN

## 2023-12-26 NOTE — Progress Notes (Signed)
    SUBJECTIVE:   CHIEF COMPLAINT / HPI:   Grief Reaction Lost partner last month 25th, Lyell Samuel. and father of her children.  Saw him that day, had dinner. He lived separately. Sent text later that night that he was [place] and had bad headache. Went to bed, after he didn't text back. Next morning no answer, not usual. She couldn't reach him. Then she went to his apartment and found him on the ground.  Did not get closure on interpersonal conflict between them.  Cremated this past Friday 6/  . Did not have a service 2/2 family dynamics.  Uncle put on Hospice last month too.  Brother passed 2 years ago.  Just started grief counseling today.  No prior diagnosis of anxiety/depression.   OBJECTIVE:   LMP 02/07/2017 (Approximate)    General: NAD, pleasant Cardio: RRR, no MRG. Cap Refill <2s. Respiratory: CTAB, normal wob on RA Skin: Warm and dry  EKG: Sinus bradycardia, nonspecific T wave inversions in aVR, V1, V2.  ASSESSMENT/PLAN:   Assessment & Plan Grief reaction Grief reaction without red flags.  Insomnia/anxiety attacks are present.  Currently in grief counseling. - Hydroxyzine 25 mg 3 times daily as needed for anxiety/sleep - Continue grief counseling - Follow-up in 4 weeks Palpitations Ongoing prior to grief reaction.  Accompanied by SOB.  EKG nonconcerning today. - Zio patch - CBC, BMP, TSH     Lavada Porteous, DO [facility]

## 2023-12-26 NOTE — Progress Notes (Unsigned)
 EP to read.

## 2023-12-26 NOTE — Patient Instructions (Addendum)
 It was great to see you! Thank you for allowing me to participate in your care!   I recommend that you always bring your medications to each appointment as this makes it easy to ensure we are on the correct medications and helps us  not miss when refills are needed.  Our plans for today:  - I have sent an order for Zio patch, you will receive this in the mail.  When you receive this, please make appointment for us  to place a Zio patch in our office. - I have prescribed hydroxyzine, you can take medication up to 3 times daily as needed.  They can make you sleepy, and I do not recommend take his medication before you operate vehicles or heavy machinery. - Follow-up in 4 weeks - We are checking some labs today, I will message you on MyChart with the results.  If there are any concerning results, I will call you.   Take care and seek immediate care sooner if you develop any concerns. Please remember to show up 15 minutes before your scheduled appointment time!  Lavada Porteous, DO [facility]   Here are some tips to improve your sleep:   Keep a regular sleep schedule. Go to bed and wake up at the same time every day, even on weekends. This helps your body know when it's time to sleep and wake up.   Create a relaxing bedtime routine. Do something calming before bed, like reading, listening to soft music, or taking a warm bath. Try to avoid screens (phones, tablets, TV) for at least 30 minutes before bedtime, as the light can make it harder to fall asleep.   Make your bedroom comfortable. Keep your room dark, quiet, and cool. Use blackout curtains or an eye mask if needed. Earplugs or a white noise machine can help block out noise.   Be careful with food and drinks. Avoid caffeine (coffee, tea, soda, energy drinks) in the afternoon and evening. Don't drink alcohol or smoke near bedtime, as these can disrupt your sleep. Try not to eat heavy meals late at night.   Get regular exercise. Being  active during the day can help you sleep better at night. Try to finish exercise at least a few hours before bedtime.   Get some sunlight during the day. Daytime exposure to natural light helps your body's sleep-wake cycle. Try to spend time outside or near a window during the day.   Use your bed only for sleep and sex. Don't read, watch TV, or use your phone in bed. This helps your brain connect your bed with sleep.   Avoid naps, especially in the late afternoon or evening. If you need to nap, keep it short (less than 30 minutes) and before 3 p.m.   Manage stress. Try relaxation techniques like deep breathing, meditation, or gentle stretching to help calm your mind before bed.  If you continue to have trouble sleeping after trying these tips, talk to your healthcare provider. For people with ongoing sleep problems, the American Academy of Sleep Medicine and the U.S[place] Department of [facility] and Department of Defense recommend cognitive behavioral therapy for insomnia (CBT-I) as the most effective treatment. Remember, good sleep habits can take time to work, so be patient and keep practicing these steps.

## 2023-12-27 LAB — CBC
Hematocrit: 40.2 % (ref 34.0–46.6)
Hemoglobin: 12.8 g/dL (ref 11.1–15.9)
MCH: 29 pg (ref 26.6–33.0)
MCHC: 31.8 g/dL (ref 31.5–35.7)
MCV: 91 fL (ref 79–97)
Platelets: 244 10*3/uL (ref 150–450)
RBC: 4.41 x10E6/uL (ref 3.77–5.28)
RDW: 13.8 % (ref 11.7–15.4)
WBC: 6.8 10*3/uL (ref 3.4–10.8)

## 2023-12-27 LAB — BASIC METABOLIC PANEL WITH GFR
BUN/Creatinine Ratio: 15 (ref 9–23)
BUN: 11 mg/dL (ref 6–24)
CO2: 25 mmol/L (ref 20–29)
Calcium: 9.3 mg/dL (ref 8.7–10.2)
Chloride: 102 mmol/L (ref 96–106)
Creatinine, Ser: 0.75 mg/dL (ref 0.57–1.00)
Glucose: 84 mg/dL (ref 70–99)
Potassium: 4.4 mmol/L (ref 3.5–5.2)
Sodium: 142 mmol/L (ref 134–144)
eGFR: 94 mL/min/{1.73_m2} (ref >59)

## 2023-12-27 LAB — TSH RFX ON ABNORMAL TO FREE T4: TSH: 0.405 u[IU]/mL — ABNORMAL LOW (ref 0.450–4.500)

## 2023-12-27 LAB — T4F: T4,Free (Direct): 1.09 ng/dL (ref 0.82–1.77)

## 2023-12-29 ENCOUNTER — Ambulatory Visit: Payer: Self-pay | Admitting: Student

## 2024-01-08 ENCOUNTER — Other Ambulatory Visit (HOSPITAL_COMMUNITY): Payer: Self-pay

## 2024-01-08 ENCOUNTER — Telehealth: Payer: Self-pay

## 2024-01-08 NOTE — Telephone Encounter (Signed)
 Pharmacy Patient Advocate Encounter   Received notification from CoverMyMeds that prior authorization for Skyrizi  360MG /2.4ML (150MG /ML) single-dose prefilled cartridge with on-body injector is required/requested.   Insurance verification completed.   The patient is insured through [facility] Medicare Part D .   Per test claim: Patient needs an OV to have updated documentation of progress on medication.

## 2024-01-08 NOTE — Telephone Encounter (Signed)
 Noted will await a response

## 2024-01-09 ENCOUNTER — Other Ambulatory Visit: Payer: Self-pay

## 2024-01-09 NOTE — Telephone Encounter (Signed)
 01/22/24 at 1:30 pm appt made to see Marissa Failing PA The pt has been advised

## 2024-01-09 NOTE — Telephone Encounter (Signed)
 No, this will not work. With specialty medications, they need updated information for her progress and sometimes labs. In this case, the current PA is being submitted after her induction has been completed and her first at home injection so the insurance is requiring progress for this and how the patient is responding to the medication since starting.

## 2024-01-09 NOTE — Telephone Encounter (Signed)
 She had a procedure in January this year will that not work?

## 2024-01-11 ENCOUNTER — Other Ambulatory Visit (HOSPITAL_COMMUNITY): Payer: Self-pay

## 2024-01-11 ENCOUNTER — Other Ambulatory Visit: Payer: Self-pay

## 2024-01-11 NOTE — Progress Notes (Signed)
 Specialty Pharmacy Refill Coordination Note  Spoke with [name] (Self).   Marissa Blankenship is a 58 y.o. female contacted today regarding refills of specialty medication(s) Risankizumab -rzaa (Skyrizi )  Injection date: 01/22/24.   Patient requested: Delivery   Delivery date: 01/16/24   Verified address: [address]  [place] [address]  Medication will be filled on 01/15/24.

## 2024-01-14 ENCOUNTER — Other Ambulatory Visit: Payer: Self-pay | Admitting: Student

## 2024-01-14 DIAGNOSIS — I10 Essential (primary) hypertension: Secondary | ICD-10-CM

## 2024-01-22 ENCOUNTER — Ambulatory Visit: Payer: Self-pay | Admitting: Physician Assistant

## 2024-01-22 ENCOUNTER — Other Ambulatory Visit (INDEPENDENT_AMBULATORY_CARE_PROVIDER_SITE_OTHER)

## 2024-01-22 ENCOUNTER — Ambulatory Visit (INDEPENDENT_AMBULATORY_CARE_PROVIDER_SITE_OTHER): Admitting: Physician Assistant

## 2024-01-22 ENCOUNTER — Encounter: Payer: Self-pay | Admitting: Physician Assistant

## 2024-01-22 VITALS — BP 122/70 | HR 59 | Ht 64.0 in | Wt 165.0 lb

## 2024-01-22 DIAGNOSIS — K5000 Crohn's disease of small intestine without complications: Secondary | ICD-10-CM

## 2024-01-22 DIAGNOSIS — R7989 Other specified abnormal findings of blood chemistry: Secondary | ICD-10-CM

## 2024-01-22 LAB — B12 AND FOLATE PANEL
Folate: 19 ng/mL (ref >5.9)
Vitamin B-12: 479 pg/mL (ref 211–911)

## 2024-01-22 LAB — VITAMIN D 25 HYDROXY (VIT D DEFICIENCY, FRACTURES): VITD: 14.92 ng/mL — ABNORMAL LOW (ref 30.00–100.00)

## 2024-01-22 LAB — IBC + FERRITIN
Ferritin: 45.9 ng/mL (ref 10.0–291.0)
Iron: 83 ug/dL (ref 42–145)
Saturation Ratios: 25.8 % (ref 20.0–50.0)
TIBC: 322 ug/dL (ref 250.0–450.0)
Transferrin: 230 mg/dL (ref 212.0–360.0)

## 2024-01-22 LAB — C-REACTIVE PROTEIN: CRP: <1 mg/dL (ref 0.5–20.0)

## 2024-01-22 LAB — SEDIMENTATION RATE: Sed Rate: 21 mm/h (ref 0–30)

## 2024-01-22 NOTE — Patient Instructions (Signed)
 Your provider has requested that you go to the basement level for lab work before leaving today. Press B on the elevator. The lab is located at the first door on the left as you exit the elevator.   _______________________________________________________  If your blood pressure at your visit was 140/90 or greater, please contact your primary care physician to follow up on this.  _______________________________________________________  If you are age 56 or older, your body mass index should be between 23-30. Your Body mass index is 28.32 kg/m. If this is out of the aforementioned range listed, please consider follow up with your Primary Care Provider.  If you are age 59 or younger, your body mass index should be between 19-25. Your Body mass index is 28.32 kg/m. If this is out of the aformentioned range listed, please consider follow up with your Primary Care Provider.   ________________________________________________________  The [place] [facility] providers would like to encourage you to use MYCHART to communicate with providers for non-urgent requests or questions.  Due to long hold times on the telephone, sending your provider a message by [facility] may be a faster and more efficient way to get a response.  Please allow 48 business hours for a response.  Please remember that this is for non-urgent requests.  _______________________________________________________

## 2024-01-22 NOTE — Progress Notes (Signed)
 Please call the patient and let her know that labs look good other than a low vitamin D .  I like her to pick up some over-the-counter vitamin D  2000 IU and take 1 tablet daily for the next 4 wk-then put in a recheck of her vitamin D  in 4 to 6 weeks.  Thanks, JL L

## 2024-01-22 NOTE — Progress Notes (Signed)
 Chief Complaint: Crohn's Follow up  HPI:    [name] is a  58 y/o female, known to Dr. Legrand, with a past medical history as listed below including Crohn's disease, IBS and multiple others, who presents to clinic today for follow-up of Crohn's disease.    05/10/2023 office visit with Dr. Legrand for follow-up of ileal Crohn's disease.  History of ileocecectomy several years ago maintained on Stelara  every 4 weeks.  Last colonoscopy January 2023 with significant improvement from the prior exam.  Previously intolerant of Azathioprine  due to arthralgias on a few occasions have offered a trial of 6-MP or Methotrexate but she is reluctant to do so.  EGD for chronic nausea 05/2019 H. pylori negative.  Time of that follow-up doing well.  Tolerance with Stelara  90 mg every 4 weeks.  Occasional nausea but manageable with Zofran  8 mg.  At that time recommended a colonoscopy in January.    07/27/2023 colonoscopy with patent side-to-side ileocolonic anastomosis characterized by ulceration, 1 diminutive polyp in the rectum.  At that time recommended change from current therapy of Stelara  to Skyrizi .    Today, patient presents to clinic and tells me that she is doing well.  She is having a daily soft solid bowel movement with no abdominal pain and no signs of hematochezia.  In general feels good since making the switch to Skyrizi .  Denies any new GI complaints or concerns.    Denies fever, chills, weight loss or symptoms that awaken her from sleep.  Past Medical History:  Diagnosis Date   Acid reflux    Allergy    Anemia    Anxiety    Crohn's disease (HCC)    Depression    Hypertension    IBS (irritable bowel syndrome)    spastic colon   Plantar fasciitis    PRE-ECLAMPSIA 06/18/2007   Qualifier: Diagnosis of  By: Adella MD, Almarie Gores of breath    Small bowel obstruction [facility])     Past Surgical History:  Procedure Laterality Date   CESAREAN SECTION     2   COLONOSCOPY      COLONOSCOPY WITH PROPOFOL  N/A 12/02/2015   Procedure: COLONOSCOPY WITH PROPOFOL ;  Surgeon: Victory LITTIE Legrand DOUGLAS, MD;  Location: [facility];  Service: Gastroenterology;  Laterality: N/A;   ESOPHAGOGASTRODUODENOSCOPY     LAPAROSCOPIC ILEOCECECTOMY N/A 01/31/2017   Procedure: LAPAROSCOPIC LYSIS OF ADHESIONS, ILEOCECECTOMY, STRICTUROPLASTY, BLADDER REPAIR, RIGHT SALPINGECTOMY;  Surgeon: Sheldon Standing, MD;  Location: [facility] ORS;  Service: General;  Laterality: N/A;   UPPER GASTROINTESTINAL ENDOSCOPY     WISDOM TOOTH EXTRACTION      Current Outpatient Medications  Medication Sig Dispense Refill   hydrOXYzine  (ATARAX ) 25 MG tablet Take 1 tablet (25 mg total) by mouth 3 (three) times daily as needed. 30 tablet 0   omeprazole  (PRILOSEC) 40 MG capsule TAKE 1 CAPSULE BY MOUTH EVERY DAY 90 capsule 1   ondansetron  (ZOFRAN ) 8 MG tablet Take 1 tablet (8 mg total) by mouth every 8 (eight) hours as needed for nausea or vomiting. 60 tablet 2   prochlorperazine  (COMPAZINE ) 10 MG tablet Take 1 tablet (10 mg total) by mouth every 6 (six) hours as needed for nausea or vomiting. 30 tablet 0   Risankizumab -rzaa (SKYRIZI ) 360 MG/2.4ML SOCT Inject 360 mg into the skin every 8 (eight) weeks. starting at week 12 (4 weeks after last infusion) and every 8 weeks thereafter 2.4 mL 5   Telmisartan -amLODIPine  80-10 MG TABS Take 1  tablet by mouth daily. 30 tablet 2   No current facility-administered medications for this visit.    Allergies as of 01/22/2024   (No Known Allergies)    Family History  Problem Relation Age of Onset   Hypertension Mother 35   Hypertension Father 17   Crohn's disease Sister    Diabetes Paternal Grandmother    Hypertension Brother    Colon cancer Neg Hx    Pancreatic cancer Neg Hx    Stomach cancer Neg Hx    Esophageal cancer Neg Hx    Rectal cancer Neg Hx    BRCA 1/2 Neg Hx    Breast cancer Neg Hx     Social History   Socioeconomic History   Marital status: Divorced    Spouse name: Not  on file   Number of children: 3   Years of education: 14   Highest education level: Associate degree: occupational, Scientist, product/process development, or vocational program  Occupational History   Occupation: disability   Tobacco Use   Smoking status: Former    Current packs/day: 0.00    Types: Cigarettes    Quit date: 04/13/2022    Years since quitting: 1.7   Smokeless tobacco: Never  Vaping Use   Vaping status: Never Used  Substance and Sexual Activity   Alcohol use: Yes    Comment: Occassionally    Drug use: No   Sexual activity: Yes    Partners: Male  Other Topics Concern   Not on file  Social History Narrative   Patient lives in [place] with her two sons.    Patient has a daughter who she is close with.    Patient enjoys walking for exercise.   Patient enjoys spending time with her grandchildren.       Social Drivers of Corporate investment banker Strain: Low Risk  (12/26/2023)   Overall Financial Resource Strain (CARDIA)    Difficulty of Paying Living Expenses: Not hard at all  Food Insecurity: No Food Insecurity (12/26/2023)   Hunger Vital Sign    Worried About Running Out of Food in the Last Year: Never true    Ran Out of Food in the Last Year: Never true  Transportation Needs: No Transportation Needs (12/26/2023)   PRAPARE - Administrator, Civil Service (Medical): No    Lack of Transportation (Non-Medical): No  Physical Activity: Insufficiently Active (12/26/2023)   Exercise Vital Sign    Days of Exercise per Week: 1 day    Minutes of Exercise per Session: 30 min  Stress: Stress Concern Present (12/26/2023)   [employer] of Occupational Health - Occupational Stress Questionnaire    Feeling of Stress : Very much  Social Connections: Moderately Isolated (12/26/2023)   Social Connection and Isolation Panel    Frequency of Communication with Friends and Family: More than three times a week    Frequency of Social Gatherings with Friends and Family: Twice a week     Attends Religious Services: More than 4 times per year    Active Member of [employer] or Organizations: No    Attends Banker Meetings: Never    Marital Status: Divorced  Catering manager Violence: Not At Risk (11/02/2023)   Humiliation, Afraid, Rape, and Kick questionnaire    Fear of Current or Ex-Partner: No    Emotionally Abused: No    Physically Abused: No    Sexually Abused: No    Review of Systems:    Constitutional: No weight loss, fever  or chills Skin: No rash Cardiovascular: No chest pain  Respiratory: No SOB  Gastrointestinal: See HPI and otherwise negative Genitourinary: No dysuria  Neurological: No headache, dizziness or syncope Musculoskeletal: No new muscle or joint pain Hematologic: No bleeding  Psychiatric: No history of depression or anxiety   Physical Exam:  Vital signs: BP 122/70   Pulse (!) 59   Ht 5' 4 (1.626 m)   Wt 165 lb (74.8 kg)   LMP 02/07/2017 (Approximate)   BMI 28.32 kg/m    Constitutional:   Pleasant AA female appears to be in NAD, Well developed, Well nourished, alert and cooperative Head:  Normocephalic and atraumatic. Eyes:   PEERL, EOMI. No icterus. Conjunctiva pink. Ears:  Normal auditory acuity. Neck:  Supple Throat: Oral cavity and pharynx without inflammation, swelling or lesion.  Respiratory: Respirations even and unlabored. Lungs clear to auscultation bilaterally.   No wheezes, crackles, or rhonchi.  Cardiovascular: Normal S1, S2. No MRG. Regular rate and rhythm. No peripheral edema, cyanosis or pallor.  Gastrointestinal:  Soft, nondistended, nontender. No rebound or guarding. Normal bowel sounds. No appreciable masses or hepatomegaly. Rectal:  Not performed.  Msk:  Symmetrical without gross deformities. Without edema, no deformity or joint abnormality.  Neurologic:  Alert and  oriented x4;  grossly normal neurologically.  Skin:   Dry and intact without significant lesions or rashes. Psychiatric:  Demonstrates good  judgement and reason without abnormal affect or behaviors.  RELEVANT LABS AND IMAGING: CBC    Component Value Date/Time   WBC 6.8 12/26/2023 1547   WBC 5.8 04/25/2022 1021   RBC 4.41 12/26/2023 1547   RBC 4.55 04/25/2022 1021   HGB 12.8 12/26/2023 1547   HCT 40.2 12/26/2023 1547   PLT 244 12/26/2023 1547   MCV 91 12/26/2023 1547   MCH 29.0 12/26/2023 1547   MCH 26.8 02/03/2017 0534   MCHC 31.8 12/26/2023 1547   MCHC 32.7 04/25/2022 1021   RDW 13.8 12/26/2023 1547   LYMPHSABS 1.7 04/25/2022 1021   LYMPHSABS 1.6 01/02/2020 1439   MONOABS 0.4 04/25/2022 1021   EOSABS 0.1 04/25/2022 1021   EOSABS 0.1 01/02/2020 1439   BASOSABS 0.0 04/25/2022 1021   BASOSABS 0.0 01/02/2020 1439    CMP     Component Value Date/Time   NA 142 12/26/2023 1547   K 4.4 12/26/2023 1547   CL 102 12/26/2023 1547   CO2 25 12/26/2023 1547   GLUCOSE 84 12/26/2023 1547   GLUCOSE 98 04/25/2022 1021   BUN 11 12/26/2023 1547   CREATININE 0.75 12/26/2023 1547   CALCIUM  9.3 12/26/2023 1547   PROT 7.5 04/25/2022 1021   PROT 7.1 09/19/2017 0905   ALBUMIN  4.3 04/25/2022 1021   ALBUMIN  4.2 09/19/2017 0905   AST 16 04/25/2022 1021   ALT 10 04/25/2022 1021   ALKPHOS 82 04/25/2022 1021   BILITOT 0.4 04/25/2022 1021   BILITOT 0.2 09/19/2017 0905   GFRNONAA 109 09/19/2017 0905   GFRAA 125 09/19/2017 0905    Assessment: 1.  Crohn's ileitis: With prior ileocecal resection, nonresponder to anti-TNF therapy, was maintained on Stelara  for years, recently switched his Skyrizi  after colonoscopy in January showing active disease, did not want additional immunomodulators, reviewed recent CBC and BMP which were normal, now doing well on Skyrizi  over the past 5 months, no complaints  Plan: 1.  Repeat B12/folate/ferritin/iron panel/Vit D 2.  Ordered ESR/CRP and fecal calprotectin to assess disease state today. 3.  Patient will continue on Skyrizi  unless labs show us  that  this is not working well for her. 4.  Patient  to follow in clinic per recommendations after labs above.  Delon Failing, PA-C  Gastroenterology 01/22/2024, 1:28 PM  Cc: Mannam, Praveen, MD

## 2024-01-23 ENCOUNTER — Telehealth: Payer: Self-pay

## 2024-01-23 ENCOUNTER — Other Ambulatory Visit (HOSPITAL_COMMUNITY): Payer: Self-pay

## 2024-01-23 NOTE — Telephone Encounter (Signed)
 Pharmacy Patient Advocate Encounter   Received notification from CoverMyMeds that prior authorization for Skyrizi  360MG /2.4ML (150MG /ML) single-dose prefilled cartridge with on-body injector is required/requested.   Insurance verification completed.   The patient is insured through [facility] Medicare Part D .   Per test claim: PA required; PA submitted to above mentioned insurance via CoverMyMeds Key/confirmation #/EOC [id number] Status is pending

## 2024-01-23 NOTE — Addendum Note (Signed)
 Addended by: Anarely Nicholls B on: 01/23/2024 01:36 PM   Modules accepted: Orders

## 2024-01-24 NOTE — Progress Notes (Signed)
 ____________________________________________________________  Attending physician addendum:  Thank you for sending this case to me. I have reviewed the entire note and agree with the plan.   Amada Jupiter, MD  ____________________________________________________________

## 2024-01-24 NOTE — Telephone Encounter (Signed)
 Pharmacy Patient Advocate Encounter  Received notification from [facility] Medicare Part D that Prior Authorization for Skyrizi  360MG /2.4ML (150MG /ML) single-dose prefilled cartridge with on-body injector has been APPROVED from 01-23-2024 to 07-17-2024   PA #/Case ID/Reference #: [id number]

## 2024-01-24 NOTE — Telephone Encounter (Signed)
 Noted the pt has been advised

## 2024-01-26 NOTE — Telephone Encounter (Signed)
 PT is calling because she need to confirm the dosage she needs to start Vitamin D . She cannot fine 2000mg  OTC. Please advise.

## 2024-01-29 ENCOUNTER — Encounter: Payer: Self-pay | Admitting: Student

## 2024-01-29 ENCOUNTER — Ambulatory Visit (INDEPENDENT_AMBULATORY_CARE_PROVIDER_SITE_OTHER): Admitting: Student

## 2024-01-29 VITALS — BP 110/69 | HR 62 | Ht 64.0 in | Wt 163.5 lb

## 2024-01-29 DIAGNOSIS — F4320 Adjustment disorder, unspecified: Secondary | ICD-10-CM

## 2024-01-29 MED ORDER — ESCITALOPRAM OXALATE 10 MG PO TABS: 10.0000 mg | ORAL_TABLET | Freq: Every day | ORAL | 1 refills | Status: DC

## 2024-01-29 NOTE — Patient Instructions (Addendum)
 It was great to see you! Thank you for allowing me to participate in your care!   I recommend that you always bring your medications to each appointment as this makes it easy to ensure we are on the correct medications and helps us  not miss when refills are needed.  Our plans for today:  - Please take 10 mg of Lexapro  daily for 4 weeks, then follow-up - Please continue to take Atarax  as needed at night for anxiety - We will recheck thyroid  levels in 4 weeks - If you experience any side effects we discussed today, please let us  know  Take care and seek immediate care sooner if you develop any concerns. Please remember to show up 15 minutes before your scheduled appointment time!  Gladis Church, DO [facility]

## 2024-01-29 NOTE — Progress Notes (Signed)
    SUBJECTIVE:   CHIEF COMPLAINT / HPI:   Grief reaction Still seeing grief counseling weekly. Atarax  at night for anxiety, which is very sedating for her. Taking it nightly at this time.  Trouble with focus. No prior treatment for anxiety and depression.  But does report some prior anxiety. She is currently struggling with getting her tasks done at work, and getting through her day due to anxiety and intrusive thoughts.   OBJECTIVE:   BP 110/69   Pulse 62   Ht 5' 4 (1.626 m)   Wt 163 lb 8 oz (74.2 kg)   LMP 02/07/2017 (Approximate)   SpO2 100%   BMI 28.06 kg/m    General: NAD, well-appearing, well-nourished Respiratory: No respiratory distress, breathing comfortably, able to speak in full sentences Skin: warm and dry, no rashes noted on exposed skin Psych: Appropriate affect and mood  ASSESSMENT/PLAN:   Assessment & Plan Grief reaction Using shared decision making, will initiate pharmacotherapy. - Lexapro  10 mg daily - Continue Atarax  as needed for panic symptoms - Follow-up in 4 weeks    Gladis Church, DO [facility]

## 2024-02-11 ENCOUNTER — Other Ambulatory Visit: Payer: Self-pay | Admitting: Gastroenterology

## 2024-02-21 ENCOUNTER — Other Ambulatory Visit: Payer: Self-pay | Admitting: Student

## 2024-02-21 DIAGNOSIS — R002 Palpitations: Secondary | ICD-10-CM | POA: Diagnosis not present

## 2024-02-21 DIAGNOSIS — F4320 Adjustment disorder, unspecified: Secondary | ICD-10-CM

## 2024-02-24 DIAGNOSIS — R002 Palpitations: Secondary | ICD-10-CM

## 2024-02-26 ENCOUNTER — Ambulatory Visit (INDEPENDENT_AMBULATORY_CARE_PROVIDER_SITE_OTHER): Admitting: Student

## 2024-02-26 ENCOUNTER — Encounter: Payer: Self-pay | Admitting: Student

## 2024-02-26 VITALS — BP 152/80 | HR 58 | Ht 64.0 in | Wt 164.2 lb

## 2024-02-26 DIAGNOSIS — I10 Essential (primary) hypertension: Secondary | ICD-10-CM | POA: Diagnosis not present

## 2024-02-26 DIAGNOSIS — R002 Palpitations: Secondary | ICD-10-CM

## 2024-02-26 DIAGNOSIS — F4320 Adjustment disorder, unspecified: Secondary | ICD-10-CM | POA: Diagnosis not present

## 2024-02-26 DIAGNOSIS — E0590 Thyrotoxicosis, unspecified without thyrotoxic crisis or storm: Secondary | ICD-10-CM | POA: Diagnosis not present

## 2024-02-26 NOTE — Patient Instructions (Signed)
 It was great to see you! Thank you for allowing me to participate in your care!   I recommend that you always bring your medications to each appointment as this makes it easy to ensure we are on the correct medications and helps us  not miss when refills are needed.  Our plans for today:  - Continue taking your current blood pressure medications, follow-up in 2 weeks - Continue taking Lexapro  10 mg daily, you can also take hydroxyzine  at night as needed for anxiety -We are checking her thyroid  levels today, we will let you know if these show   Take care and seek immediate care sooner if you develop any concerns. Please remember to show up 15 minutes before your scheduled appointment time!  Gladis Church, DO [facility]

## 2024-02-26 NOTE — Progress Notes (Signed)
    SUBJECTIVE:   CHIEF COMPLAINT / HPI:   Grief reaction Started on Lexapro  for grief reaction, hydroxyzine  as needed.  Lexapro  10 mg, doing well for her.  Adherent to medication, no side effects.  Would like to continue his medications time.  Palpitations Workup at this time overall unremarkable, see subclinical hypothyroidism below.  Reviewed Zio patch, symptoms tend to occur with normal sinus rhythm and PVCs.  PVC burden is less than 1%.  Subclinical hypothyroidism Noted to have suppressed TSH during workup for palpitations.  Will repeat labs today.  She does endorse some increasing fatigue.  Hypertension Not adherent to blood pressure medication.  Frequently missing doses.  Reported some headaches, no current headache.  Denies dizziness, vision changes, chest pain, shortness of breath.   OBJECTIVE:   BP (!) 152/80   Pulse (!) 58   Ht 5' 4 (1.626 m)   Wt 164 lb 3.2 oz (74.5 kg)   LMP 02/07/2017 (Approximate)   SpO2 100%   BMI 28.18 kg/m    General: NAD, pleasant Cardio: RRR, no MRG.  Respiratory: CTAB, normal wob on RA Skin: Warm and dry  ASSESSMENT/PLAN:   Assessment & Plan Subclinical hyperthyroidism Some increased fatigue recently.  Question if patient may have thyroiditis, leading to a hyper than hypothyroid state. - TSH, reflex T4 Grief reaction Improving. - Continue Lexapro  10 mg daily - Continue hydroxyzine  as needed Palpitations Some improvement, workup reassuring. Continue to monitor.  Would not pursue beta-blocker therapy this time given pulse of 58, and minimal PVC burden. Hypertension, unspecified type -Continue telmisartan -amlodipine  80-10 mg daily - Follow-up in 2 weeks   Gladis Church, DO [facility]

## 2024-02-26 NOTE — Assessment & Plan Note (Signed)
-  Continue telmisartan -amlodipine  80-10 mg daily - Follow-up in 2 weeks

## 2024-02-27 LAB — T4F: T4,Free (Direct): 1.08 ng/dL (ref 0.82–1.77)

## 2024-02-27 LAB — TSH RFX ON ABNORMAL TO FREE T4: TSH: 0.441 u[IU]/mL — ABNORMAL LOW (ref 0.450–4.500)

## 2024-02-28 ENCOUNTER — Ambulatory Visit: Payer: Self-pay | Admitting: Student

## 2024-03-11 ENCOUNTER — Other Ambulatory Visit: Payer: Self-pay

## 2024-03-13 ENCOUNTER — Other Ambulatory Visit: Payer: Self-pay

## 2024-03-13 ENCOUNTER — Other Ambulatory Visit: Payer: Self-pay | Admitting: Pharmacy Technician

## 2024-03-13 NOTE — Progress Notes (Signed)
 Specialty Pharmacy Refill Coordination Note  Marissa Blankenship is a 58 y.o. female contacted today regarding refills of specialty medication(s) Risankizumab -rzaa (Skyrizi )   Patient requested Delivery   Delivery date: 03/14/24   Verified address: [address]  [place] [id number]   Medication will be filled on 03/12/24.

## 2024-03-19 ENCOUNTER — Telehealth: Payer: Self-pay | Admitting: Physician Assistant

## 2024-03-19 NOTE — Telephone Encounter (Signed)
 Inbound call from pt requesting to speak to the nurse in regards to her surgical site. Patient is requesting a call back. Please advise.

## 2024-03-19 NOTE — Telephone Encounter (Signed)
 Spoke w pt. She reports that 2 days ago she noticed her healed surgical incision from a small bowel resection a few years ago was red, irritated, and itching. States Dr. Sheldon originally performed the procedure. Advised pt that due to a possible infection of her surgical site, we are not the office that can best manage this issue and she should follow up with her PCP. Pt verbalized understanding.

## 2024-03-25 ENCOUNTER — Ambulatory Visit: Admitting: Student

## 2024-04-19 ENCOUNTER — Other Ambulatory Visit: Payer: Self-pay | Admitting: Student

## 2024-04-23 ENCOUNTER — Ambulatory Visit: Admitting: Student

## 2024-04-23 VITALS — BP 148/90 | HR 59 | Ht 64.0 in | Wt 162.4 lb

## 2024-04-23 DIAGNOSIS — E0590 Thyrotoxicosis, unspecified without thyrotoxic crisis or storm: Secondary | ICD-10-CM

## 2024-04-23 DIAGNOSIS — F4320 Adjustment disorder, unspecified: Secondary | ICD-10-CM

## 2024-04-23 DIAGNOSIS — R002 Palpitations: Secondary | ICD-10-CM | POA: Diagnosis not present

## 2024-04-23 DIAGNOSIS — Z23 Encounter for immunization: Secondary | ICD-10-CM

## 2024-04-23 DIAGNOSIS — I10 Essential (primary) hypertension: Secondary | ICD-10-CM

## 2024-04-23 NOTE — Addendum Note (Signed)
 Addended by: Katrinia Straker on: 04/23/2024 10:04 AM   Modules accepted: Orders

## 2024-04-23 NOTE — Patient Instructions (Signed)
 It was great to see you! Thank you for allowing me to participate in your care!   I recommend that you always bring your medications to each appointment as this makes it easy to ensure we are on the correct medications and helps us  not miss when refills are needed.  Our plans for today:  - If your blood pressure still elevated on recheck, please schedule appoint to see me in 2 weeks - Additionally we will follow-up in February for repeat thyroid  testing  Take care and seek immediate care sooner if you develop any concerns. Please remember to show up 15 minutes before your scheduled appointment time!  Gladis Church, DO [facility]

## 2024-04-23 NOTE — Progress Notes (Signed)
    SUBJECTIVE:   CHIEF COMPLAINT / HPI:   Discussed the use of AI scribe software for clinical note transcription with the patient, who gave verbal consent to proceed.  History of Present Illness MEIKA EARLL is a 58 year old female who presents for follow-up.  Hypertension - Takes telmisartan -amlodipine  80-10 mg daily, usually at night - Uncertain if last dose was taken last night - No chest pain, dizziness, or blurry vision  Mood disturbance and grief reaction - Previously on Lexapro , tapered off due to side effects - No longer using hydroxyzine  - Currently using [place] John's wort once or twice daily - Mood improved but not fully resolved  Palpitations and cardiac arrhythmia - Palpitations have decreased in frequency - Recent episode of palpitations in the last few days - Previous Zio patch study showed premature ventricular contractions (PVCs)  Subclinical hyperthyroidism monitoring - Last TSH 0.441 - Last free T4 1.08 - Laboratory monitoring scheduled for February   OBJECTIVE:   BP (!) 148/90   Pulse (!) 59   Ht 5' 4 (1.626 m)   Wt 162 lb 6 oz (73.7 kg)   LMP 02/07/2017 (Approximate)   SpO2 100%   BMI 27.87 kg/m    General: NAD, pleasant Cardio: RRR, no MRG. Cap Refill <2s. Respiratory: CTAB, normal wob on RA Skin: Warm and dry  ASSESSMENT/PLAN:   Assessment & Plan Hypertension Blood pressure slightly elevated despite telmisartan -amlodipine  80-10 mg daily. - F/u 2 weeks - Consider adding third antihypertensive if blood pressure remains elevated.  Grief reaction Discontinued Lexapro  due to side effects. Started St. John's Wort, potential for drug interactions. Hydroxyzine  discontinued. - Remove Lexapro  and hydroxyzine  from medication list. - Educated on St. John's Wort interactions.  Subclinical hyperthyroidism Monitoring subclinical hyperthyroidism. TSH improved to 0.441, free T4 at 1.08. - Repeat thyroid  function tests in February. - Schedule  follow-up in February to review thyroid  function.  Palpitations - Palpitations decreased. Zio patch showed PVCs likely causing palpitations. - Borderline bradycardic, asymptomatic. - CTM  General Health Maintenance Due for COVID, flu, and pneumonia vaccinations. Insurance coverage for COVID vaccine verified. - Administer COVID, flu, and Prevnar 20 vaccines during visit.  Gladis Church, DO [facility]

## 2024-04-23 NOTE — Progress Notes (Deleted)
    SUBJECTIVE:   CHIEF COMPLAINT / HPI:   Discussed the use of AI scribe software for clinical note transcription with the patient, who gave verbal consent to proceed.  History of Present Illness     PERTINENT  PMH / PSH: ***  OBJECTIVE:   BP (!) 146/90   Pulse (!) 59   Ht 5' 4 (1.626 m)   Wt 162 lb 6 oz (73.7 kg)   LMP 02/07/2017 (Approximate)   SpO2 100%   BMI 27.87 kg/m   *** Physical Exam   ASSESSMENT/PLAN:   Assessment & Plan      Assessment and Plan Assessment & Plan      Gladis Church, DO [facility]

## 2024-05-03 ENCOUNTER — Other Ambulatory Visit (HOSPITAL_COMMUNITY): Payer: Self-pay

## 2024-05-03 ENCOUNTER — Other Ambulatory Visit: Payer: Self-pay

## 2024-05-03 NOTE — Progress Notes (Signed)
 Specialty Pharmacy Refill Coordination Note  Spoke with Marissa Blankenship is a 58 y.o. female contacted today regarding refills of specialty medication(s) Risankizumab -rzaa (Skyrizi )  Doses on hand: 0  Injection date: 05/10/24   Patient requested: Delivery   Delivery date: 05/07/24   Verified address: [address] [place] [address]  Medication will be filled on 05/06/24.

## 2024-05-06 ENCOUNTER — Other Ambulatory Visit: Payer: Self-pay

## 2024-05-09 ENCOUNTER — Ambulatory Visit: Admitting: Student

## 2024-05-09 ENCOUNTER — Ambulatory Visit (HOSPITAL_COMMUNITY)
Admission: EM | Admit: 2024-05-09 | Discharge: 2024-05-09 | Disposition: A | Discharge status: Home / Self Care | Attending: Physician Assistant | Admitting: Physician Assistant

## 2024-05-09 ENCOUNTER — Encounter (HOSPITAL_COMMUNITY): Payer: Self-pay | Admitting: Emergency Medicine

## 2024-05-09 VITALS — BP 150/91 | Ht 64.0 in | Wt 164.0 lb

## 2024-05-09 DIAGNOSIS — I160 Hypertensive urgency: Secondary | ICD-10-CM

## 2024-05-09 DIAGNOSIS — F4320 Adjustment disorder, unspecified: Secondary | ICD-10-CM | POA: Diagnosis not present

## 2024-05-09 DIAGNOSIS — F411 Generalized anxiety disorder: Secondary | ICD-10-CM | POA: Diagnosis not present

## 2024-05-09 DIAGNOSIS — I10 Essential (primary) hypertension: Secondary | ICD-10-CM

## 2024-05-09 MED ORDER — ESCITALOPRAM OXALATE 10 MG PO TABS: 10.0000 mg | ORAL_TABLET | Freq: Every day | ORAL | 1 refills | Status: DC

## 2024-05-09 MED ORDER — HYDRALAZINE HCL 25 MG PO TABS: 25.0000 mg | ORAL_TABLET | Freq: Three times a day (TID) | ORAL | 0 refills | Status: AC | PRN

## 2024-05-09 NOTE — ED Provider Notes (Signed)
 [facility]    CSN: [id number] Arrival date & time: 05/09/24  1933      History   Chief Complaint Chief Complaint  Patient presents with   Hypertension    HPI Marissa Blankenship is a 58 y.o. female.  has a past medical history of Acid reflux, Allergy, Anemia, Anxiety, Crohn's disease (HCC), Depression, Hypertension, IBS (irritable bowel syndrome), Plantar fasciitis, PRE-ECLAMPSIA (06/18/2007), Shortness of breath, and Small bowel obstruction (HCC).   HPI  Discussed the use of AI scribe software for clinical note transcription with the patient, who gave verbal consent to proceed.   The patient, with hypertension, presents with concerns about elevated blood pressure.  She has been experiencing fluctuating blood pressure despite being on various medications. Recent readings have been high, with a measurement of 199/unknown on Saturday morning and 200/110 at 4 PM today. She took her medication and noted a slight decrease in blood pressure. She has a history of periodic adjustments to her blood pressure medications, which temporarily stabilize her readings. A few months ago, she wore a Holter monitor to investigate the issue, but the results were normal.  She experiences mild headaches and nausea, which prompted today's visit. No vision changes, lightheadedness, dizziness, confusion, syncope, difficulty speaking, facial drooping, neck pain, chest pain, or shortness of breath. She reports occasional palpitations, which have been investigated previously with normal findings.  Her family history includes a mother with atrial fibrillation and both parents with hypertension. Her father also had a quadruple bypass surgery about a decade ago.  She has a history of Crohn's disease, which is well-managed with current medications. She has Zofran  for nausea related to Crohn's but has not needed it recently. She also mentions recent grief and anxiety following the loss of her partner, for  which she has been prescribed Lexapro  and hydroxyzine . Current medications include Lexapro  for anxiety which was prescribed today by her PCP    Past Medical History:  Diagnosis Date   Acid reflux    Allergy    Anemia    Anxiety    Crohn's disease (HCC)    Depression    Hypertension    IBS (irritable bowel syndrome)    spastic colon   Plantar fasciitis    PRE-ECLAMPSIA 06/18/2007   Qualifier: Diagnosis of  By: Adella MD, Almarie Gores of breath    Small bowel obstruction [facility])     Patient Active Problem List   Diagnosis Date Noted   Crohn's disease of small intestine without complication (HCC) 08/30/2023   Long-term current use of ustekinumab  05/10/2023   Fatigue 01/02/2020   B12 deficiency 07/06/2018   Anxiety and depression 04/16/2018   Crohn's disease with complication (HCC) 09/19/2017   Hyponatremia 02/02/2017   Exacerbation of Crohn's disease with intestinal obstruction status post ileal stricturoplasty and ileocecal resection 01/31/2017 01/30/2017   Anemia 01/30/2017   Protein-calorie malnutrition, severe 12/02/2015   Hypertrophy of uterus 09/14/2015   PLANTAR FASCIITIS, LEFT 05/25/2010   Hypertensive disorder 03/28/2007   ACID REFLUX DISEASE 03/28/2007    Past Surgical History:  Procedure Laterality Date   CESAREAN SECTION     2   COLONOSCOPY     COLONOSCOPY WITH PROPOFOL  N/A 12/02/2015   Procedure: COLONOSCOPY WITH PROPOFOL ;  Surgeon: Victory LITTIE Legrand DOUGLAS, MD;  Location: [facility];  Service: Gastroenterology;  Laterality: N/A;   ESOPHAGOGASTRODUODENOSCOPY     LAPAROSCOPIC ILEOCECECTOMY N/A 01/31/2017   Procedure: LAPAROSCOPIC LYSIS OF ADHESIONS, ILEOCECECTOMY, STRICTUROPLASTY, BLADDER REPAIR, RIGHT SALPINGECTOMY;  Surgeon: Sheldon Standing, MD;  Location: [facility];  Service: General;  Laterality: N/A;   UPPER GASTROINTESTINAL ENDOSCOPY     WISDOM TOOTH EXTRACTION      OB History     Gravida  6   Para  4   Term      Preterm  3   AB  1   Living   3      SAB      IAB  1   Ectopic      Multiple      Live Births  4            Home Medications    Prior to Admission medications   Medication Sig Start Date End Date Taking? Authorizing Provider  hydrALAZINE  (APRESOLINE ) 25 MG tablet Take 1 tablet (25 mg total) by mouth 3 (three) times daily as needed. 05/09/24  Yes Eriyana Sweeten E, PA-C  escitalopram  (LEXAPRO ) 10 MG tablet Take 1 tablet (10 mg total) by mouth daily. 05/09/24   Howell Lunger, DO  omeprazole  (PRILOSEC) 40 MG capsule TAKE 1 CAPSULE BY MOUTH EVERY DAY 02/12/24   Legrand Victory LITTIE DOUGLAS, MD  ondansetron  (ZOFRAN ) 8 MG tablet Take 1 tablet (8 mg total) by mouth every 8 (eight) hours as needed for nausea or vomiting. 04/25/22   Legrand Victory LITTIE DOUGLAS, MD  prochlorperazine  (COMPAZINE ) 10 MG tablet Take 1 tablet (10 mg total) by mouth every 6 (six) hours as needed for nausea or vomiting. 08/12/22   Rosendo Rush, MD  Risankizumab -rzaa (SKYRIZI ) 360 MG/2.4ML SOCT Inject 360 mg into the skin every 8 (eight) weeks. starting at week 12 (4 weeks after last infusion) and every 8 weeks thereafter 08/30/23   Legrand Victory LITTIE DOUGLAS, MD  Telmisartan -amLODIPine  80-10 MG TABS Take 1 tablet by mouth daily. 10/18/23   Howell Lunger, DO    Family History Family History  Problem Relation Age of Onset   Hypertension Mother 50   Hypertension Father 79   Crohn's disease Sister    Diabetes Paternal Grandmother    Hypertension Brother    Colon cancer Neg Hx    Pancreatic cancer Neg Hx    Stomach cancer Neg Hx    Esophageal cancer Neg Hx    Rectal cancer Neg Hx    BRCA 1/2 Neg Hx    Breast cancer Neg Hx     Social History Social History   Tobacco Use   Smoking status: Former    Current packs/day: 0.00    Types: Cigarettes    Quit date: 04/13/2022    Years since quitting: 2.0   Smokeless tobacco: Never  Vaping Use   Vaping status: Never Used  Substance Use Topics   Alcohol use: Yes    Comment: Occassionally    Drug use: No      Allergies   Patient has no known allergies.   Review of Systems Review of Systems  Eyes:  Negative for visual disturbance.  Respiratory:  Negative for shortness of breath and wheezing.   Cardiovascular:  Positive for palpitations. Negative for chest pain.  Gastrointestinal:  Positive for nausea.  Musculoskeletal:  Negative for neck pain and neck stiffness.  Neurological:  Positive for headaches. Negative for dizziness, syncope, facial asymmetry, speech difficulty and light-headedness.  Psychiatric/Behavioral:  Negative for confusion.      Physical Exam Triage Vital Signs ED Triage Vitals  Encounter Vitals Group     BP 05/09/24 1947 (!) 202/97     Girls Systolic BP Percentile --  Girls Diastolic BP Percentile --      Boys Systolic BP Percentile --      Boys Diastolic BP Percentile --      Pulse Rate 05/09/24 1947 62     Resp 05/09/24 1947 15     Temp 05/09/24 1947 97.7 F (36.5 C)     Temp Source 05/09/24 1947 Oral     SpO2 05/09/24 1947 99 %     Weight --      Height --      Head Circumference --      Peak Flow --      Pain Score 05/09/24 1946 4     Pain Loc --      Pain Education --      Exclude from Growth Chart --    No data found.  Updated Vital Signs BP (!) 181/90 (BP Location: Right Arm)   Pulse 62   Temp 97.7 F (36.5 C) (Oral)   Resp 15   LMP 02/07/2017 (Approximate)   SpO2 99%   Visual Acuity Right Eye Distance:   Left Eye Distance:   Bilateral Distance:    Right Eye Near:   Left Eye Near:    Bilateral Near:     Physical Exam Vitals reviewed.  Constitutional:      General: She is awake. She is not in acute distress.    Appearance: Normal appearance. She is well-developed and well-groomed. She is not ill-appearing, toxic-appearing or diaphoretic.  HENT:     Head: Normocephalic and atraumatic.  Eyes:     General: Lids are normal. Gaze aligned appropriately.     Extraocular Movements: Extraocular movements intact.      Conjunctiva/sclera: Conjunctivae normal.  Cardiovascular:     Rate and Rhythm: Normal rate and regular rhythm.     Pulses:          Radial pulses are 2+ on the right side and 2+ on the left side.     Heart sounds: Normal heart sounds. No murmur heard.    No friction rub. No gallop.  Pulmonary:     Effort: Pulmonary effort is normal.     Breath sounds: Normal breath sounds. No decreased air movement. No decreased breath sounds, wheezing, rhonchi or rales.  Neurological:     General: No focal deficit present.     Mental Status: She is alert and oriented to person, place, and time.     Cranial Nerves: No cranial nerve deficit, dysarthria or facial asymmetry.  Psychiatric:        Attention and Perception: Attention and perception normal.        Mood and Affect: Mood and affect normal.        Speech: Speech normal.        Behavior: Behavior normal. Behavior is cooperative.      UC Treatments / Results  Labs (all labs ordered are listed, but only abnormal results are displayed) Labs Reviewed - No data to display  EKG   Radiology No results found.  Procedures Procedures (including critical care time)  Medications Ordered in UC Medications - No data to display  Initial Impression / Assessment and Plan / UC Course  I have reviewed the triage vital signs and the nursing notes.  Pertinent labs & imaging results that were available during my care of the patient were reviewed by me and considered in my medical decision making (see chart for details).      Final Clinical Impressions(s) / UC Diagnoses  Final diagnoses:  Primary hypertension  Hypertensive urgency   Hypertension Hypertension with recent episodes of elevated blood pressure, including a reading of 200/110 mmHg. Mild headache and nausea present, but no severe symptoms such as vision changes, confusion, or chest pain. Palpitations reported, but previous Holter monitor results were normal. Recent stress and anxiety  due to loss of partner may be contributing to elevated blood pressure.  BP was rechecked prior to discharge and it was significantly improved which leads me to believe that anxiety might be driving elevated blood pressure readings.  Patient does report some anxiety improvement following discussion and addition of hydralazine  to assist with management. - Prescribe hydralazine  to be taken as needed, up to three times a day, to manage elevated blood pressure. - Advise monitoring blood pressure throughout the day and taking hydralazine  if readings are high. - Instruct to follow up with primary care physician in two weeks. - Advise to seek emergency care if experiencing severe headache, vision changes, head or neck pain, facial drooping, or loss of consciousness.  Anxiety disorder Anxiety disorder with recent exacerbation possibly related to grief and stress from the loss of partner. Anxiety may be contributing to elevated blood pressure. Currently prescribed Lexapro  for anxiety management. Hydroxyzine  previously prescribed for anxiety but discontinued by the patient. - Continue Lexapro  as prescribed for anxiety management. - Discuss the use of hydroxyzine  as needed for anxiety, noting it may cause drowsiness. - Encourage relaxation techniques such as taking a relaxing shower or bath, reading, and having an early night to help manage anxiety.  Crohn's disease Crohn's disease well-managed with current medications. Recent mild nausea noted, but not severe enough to require Zofran . - Use Zofran  as needed for nausea if it becomes more severe.    Discharge Instructions      VISIT SUMMARY:  During today's visit, we discussed your concerns about elevated blood pressure and associated symptoms, as well as your anxiety and Crohn's disease management. We reviewed your recent blood pressure readings and symptoms, and we made adjustments to your treatment plan to help manage your conditions more  effectively.  YOUR PLAN:  -HYPERTENSION: Hypertension means high blood pressure. Your recent readings have been high, and you have experienced mild headaches and nausea. We have prescribed hydralazine  to be taken as needed, up to three times a day, to help manage your blood pressure. Please monitor your blood pressure throughout the day and take hydralazine  if your readings are high. Follow up with your primary care physician in two weeks. Seek emergency care if you experience severe headache, vision changes, head or neck pain, facial drooping, or loss of consciousness.  -ANXIETY DISORDER: Anxiety disorder involves excessive worry or stress. Your anxiety may be contributing to your elevated blood pressure. Continue taking Lexapro  as prescribed for anxiety management. We discussed the use of hydroxyzine  as needed for anxiety, but be aware it may cause drowsiness. Try relaxation techniques such as taking a relaxing shower or bath, reading, and having an early night to help manage your anxiety.  -CROHN'S DISEASE: Crohn's disease is a chronic inflammatory condition of the digestive tract. Your condition is well-managed with your current medications. If your nausea becomes more severe, you can use Zofran  as needed.  INSTRUCTIONS:  Please follow up with your primary care physician in two weeks to review your blood pressure management. Continue monitoring your blood pressure throughout the day and take hydralazine  if your readings are high. Seek emergency care if you experience severe headache, vision changes, head or  neck pain, facial drooping, chest pain, or loss of consciousness.     ED Prescriptions     Medication Sig Dispense Auth. Provider   hydrALAZINE  (APRESOLINE ) 25 MG tablet Take 1 tablet (25 mg total) by mouth 3 (three) times daily as needed. 45 tablet Shayleen Eppinger E, PA-C      PDMP not reviewed this encounter.   Marylene Rocky FORBES DEVONNA 05/09/24 2036

## 2024-05-09 NOTE — Assessment & Plan Note (Signed)
 Poorly controlled.  Stress/grief may contribute. - Referral to pharmacy clinic for ambulatory blood pressure monitoring - Continue telmisartan -amlodipine  80-10 mg daily - Follow-up in 2 weeks

## 2024-05-09 NOTE — ED Triage Notes (Signed)
 Pt reports that she having issues with HTN. Saw PCP this morning about it had no changes in medications today. Reports that still running high. At 5p 200/110, went a head and took her HTN meds that takes at night. At 6 was 200/104.  Having light/faint headache.

## 2024-05-09 NOTE — Progress Notes (Signed)
    SUBJECTIVE:   CHIEF COMPLAINT / HPI:   Discussed the use of AI scribe software for clinical note transcription with the patient, who gave verbal consent to proceed.  History of Present Illness Marissa Blankenship is a 58 year old female with hypertension who presents with elevated blood pressure and headaches.  Hypertension and headache - Elevated blood pressure with a reading of 199/100 mmHg on Saturday morning - Faint headache present at the time of elevated blood pressure - Mild headaches for two to three days prior to the visit - Took blood pressure medication the night before the elevated reading - Used Tylenol  for headache relief - No blood pressure measurements taken since Saturday - Experiencing significant stress related to grieving the loss of her partner - Concern for her children, especially her twelve-year-old daughter - No current use of medication for stress or anxiety - Discontinued previous medication and St. John's Wort due to potential interactions - Continues to attend counseling sessions - Feelings of guilt and heightened worry since her partner's passing (daily and frequent worry about many things) - No loss of interest in activities, but prefers not to go out alone - No changes in appetite or thoughts of self-harm - Difficulty with sleep, characterized by early morning awakening and inability to return to sleep - Melatonin helps her fall back asleep without aftereffects   OBJECTIVE:   BP (!) 150/91   Ht 5' 4 (1.626 m)   Wt 164 lb (74.4 kg)   LMP 02/07/2017 (Approximate)   BMI 28.15 kg/m    General: NAD, pleasant Cardio: RRR, no MRG. Respiratory: CTAB, normal wob on RA GI: Abdomen is soft, not tender, not distended. BS present Skin: Warm and dry Neuro: Alert oriented x 4, no overt focal neurologic deficits, normal gait  ASSESSMENT/PLAN:   Assessment & Plan Primary hypertension Poorly controlled.  Stress/grief may contribute. - Referral to  pharmacy clinic for ambulatory blood pressure monitoring - Continue telmisartan -amlodipine  80-10 mg daily - Follow-up in 2 weeks Generalized anxiety disorder Grief reaction Ongoing grief reaction for approximately 5 months.  Additional comorbid anxiety disorder.  Lexapro  helped previously, but patient self tapered (per preference, not because side effects). - Using show decision-making, restart Lexapro  10 mg daily - Continue counseling - Follow-up in 4 weeks     Gladis Church, DO [facility]

## 2024-05-09 NOTE — Patient Instructions (Signed)
 It was great to see you! Thank you for allowing me to participate in your care!   I recommend that you always bring your medications to each appointment as this makes it easy to ensure we are on the correct medications and helps us  not miss when refills are needed.  Our plans for today:  - Take 10 mg Lexapro  daily, follow-up in 4 weeks to discuss - You will receive a call from our pharmacy team to schedule an appointment for ambulatory blood pressure monitoring.  This is a 24-hour blood pressure monitor.  We will follow-up once this is completed, you can schedule appointment for 2 weeks from now-and reschedule if necessary.  Take care and seek immediate care sooner if you develop any concerns. Please remember to show up 15 minutes before your scheduled appointment time!  Gladis Church, DO [facility]

## 2024-05-09 NOTE — Discharge Instructions (Addendum)
 VISIT SUMMARY:  During today's visit, we discussed your concerns about elevated blood pressure and associated symptoms, as well as your anxiety and Crohn's disease management. We reviewed your recent blood pressure readings and symptoms, and we made adjustments to your treatment plan to help manage your conditions more effectively.  YOUR PLAN:  -HYPERTENSION: Hypertension means high blood pressure. Your recent readings have been high, and you have experienced mild headaches and nausea. We have prescribed hydralazine  to be taken as needed, up to three times a day, to help manage your blood pressure. Please monitor your blood pressure throughout the day and take hydralazine  if your readings are high. Follow up with your primary care physician in two weeks. Seek emergency care if you experience severe headache, vision changes, head or neck pain, facial drooping, or loss of consciousness.  -ANXIETY DISORDER: Anxiety disorder involves excessive worry or stress. Your anxiety may be contributing to your elevated blood pressure. Continue taking Lexapro  as prescribed for anxiety management. We discussed the use of hydroxyzine  as needed for anxiety, but be aware it may cause drowsiness. Try relaxation techniques such as taking a relaxing shower or bath, reading, and having an early night to help manage your anxiety.  -CROHN'S DISEASE: Crohn's disease is a chronic inflammatory condition of the digestive tract. Your condition is well-managed with your current medications. If your nausea becomes more severe, you can use Zofran  as needed.  INSTRUCTIONS:  Please follow up with your primary care physician in two weeks to review your blood pressure management. Continue monitoring your blood pressure throughout the day and take hydralazine  if your readings are high. Seek emergency care if you experience severe headache, vision changes, head or neck pain, facial drooping, chest pain, or loss of consciousness.

## 2024-05-13 ENCOUNTER — Telehealth: Payer: Self-pay | Admitting: Pharmacist

## 2024-05-13 DIAGNOSIS — H25813 Combined forms of age-related cataract, bilateral: Secondary | ICD-10-CM | POA: Diagnosis not present

## 2024-05-13 NOTE — Telephone Encounter (Signed)
-----   Message from Gladis Church sent at 05/09/2024 11:16 AM EDT ----- Pharmacy referral for ambulatory blood pressure monitoring.  VBCI referral also placed.

## 2024-05-13 NOTE — Telephone Encounter (Signed)
 Reviewed and agree with Dr Rennis plan.

## 2024-05-13 NOTE — Telephone Encounter (Signed)
 Patient contacted for follow-up of request/referral for Amb Blood Pressure monitoring.   Left HIPAA compliant voice mail requesting call back to direct phone: [phone]  Total time with patient call and documentation of interaction: 4 minutes.  Follow-up phone call planned: 3-5 days if patient fails to return call/schedule

## 2024-05-21 ENCOUNTER — Encounter: Payer: Self-pay | Admitting: Pharmacist

## 2024-05-21 ENCOUNTER — Ambulatory Visit: Admitting: Pharmacist

## 2024-05-21 VITALS — BP 199/118 | Wt 161.4 lb

## 2024-05-21 DIAGNOSIS — I10 Essential (primary) hypertension: Secondary | ICD-10-CM | POA: Diagnosis not present

## 2024-05-21 NOTE — Progress Notes (Signed)
 Reviewed and agree with Dr Rennis plan.

## 2024-05-21 NOTE — Assessment & Plan Note (Signed)
 History of hypertension since 2008 longstanding currently taking; Telmisartan -amlodipine  80-10 mg with goal presssure of <130/80 mm Hg. Family history of hypertension on both maternal and paternal sides. Patient reports that she takes her Blood Pressure at home regularly and reports systolic 170-190 and diastolic 100s.  -Placed blood pressure cuff, provided education, patient instructed to wear cuff for 24 hours and return tomorrow to review results. Written patient instructions provided including activity/symptom/event log. Patient verbalized understanding of plan.

## 2024-05-21 NOTE — Progress Notes (Signed)
   S:     Chief Complaint  Patient presents with  . Medication Management    Amb Blood Pressure monitoring    58 y.o. female who presents for hypertension evaluation, education, and management. Patient arrives in  good spirits and presents without  any assistance.   Patient was referred and last seen by Primary Care Provider, Dr. Howell, on 05/09/24.  At last visit, patient was referred for ambulatory blood pressure monitoring.   PMH is significant for Crohns disease, hypertension, anxiety and depression.   Diagnosed with Hypertension in the year of 2008.    Medication compliance is reported to be good.  Discussed procedure for wearing the monitor and gave patient written instructions. Monitor was placed on non-dominant arm with instructions to return in the morning.   Current BP Medications include:  Telmisartan -amlodipine  80-10mg  daily and hydralazine  25 mg BID  Antihypertensives tried in the past include: Thiazides - cause hypokalemia  O:  Review of Systems  All other systems reviewed and are negative.   Physical Exam Vitals reviewed.  Constitutional:      Appearance: Normal appearance.  Musculoskeletal:     Right lower leg: No edema.     Left lower leg: No edema.  Neurological:     Mental Status: She is alert.  Psychiatric:        Mood and Affect: Mood normal.        Behavior: Behavior normal.        Thought Content: Thought content normal.        Judgment: Judgment normal.    Last 3 Office BP readings: BP Readings from Last 3 Encounters:  05/09/24 (!) 181/90  05/09/24 (!) 150/91  04/23/24 (!) 148/90   Basic Metabolic Panel    Component Value Date/Time   NA 142 12/26/2023 1547   K 4.4 12/26/2023 1547   CL 102 12/26/2023 1547   CO2 25 12/26/2023 1547   GLUCOSE 84 12/26/2023 1547   GLUCOSE 98 04/25/2022 1021   BUN 11 12/26/2023 1547   CREATININE 0.75 12/26/2023 1547   CALCIUM  9.3 12/26/2023 1547   GFRNONAA 109 09/19/2017 0905   GFRAA 125 09/19/2017  0905    ABPM Study Data: Arm Placement left arm For Office Goal BP of <130/80 mmHg:  ABPM thresholds: Overall BP <125/75 mmHg, daytime BP <130/80 mmHg, sleeptime BP <110/65 mmHg   A/P: History of hypertension since 2008 longstanding currently taking; Telmisartan -amlodipine  80-10 mg with goal presssure of <130/80 mm Hg. Family history of hypertension on both maternal and paternal sides. Patient reports that she takes her Blood Pressure at home regularly and reports systolic 170-190 and diastolic 100s.  -Placed blood pressure cuff, provided education, patient instructed to wear cuff for 24 hours and return tomorrow to review results. Written patient instructions provided including activity/symptom/event log. Patient verbalized understanding of plan.  Total time in face to face counseling 22 minutes.    Follow-up: Tomorrow AM - early morning appointment 8:30am Patient seen with Lawson Mao, PharmD Candidate - PY3 student and Recardo [employer] PharmD - PY4 Candidate.

## 2024-05-21 NOTE — Patient Instructions (Addendum)
 Blood Pressure Activity Diary Time Lying down/ Sleeping Walking/ Exercise Stressed/ Angry Headache/ Pain Dizzy  9 AM       10 AM       11 AM       12 PM       1 PM       2 PM       Time Lying down/ Sleeping Walking/ Exercise Stressed/ Angry Headache/ Pain Dizzy  3 PM       4 PM        5 PM       6 PM       7 PM       8 PM       Time Lying down/ Sleeping Walking/ Exercise Stressed/ Angry Headache/ Pain Dizzy  9 PM       10 PM       11 PM       12 AM       1 AM       2 AM       3 AM       Time Lying down/ Sleeping Walking/ Exercise Stressed/ Angry Headache/ Pain Dizzy  4 AM       5 AM       6 AM       7 AM       8 AM       9 AM       10 AM        Time you woke up: _________                  Time you went to sleep:__________  Come back tomorrow at 8:30 to have the monitor removed  Call the [facility] if you have any questions before then ([phone])  Wearing the Blood Pressure Monitor The cuff will inflate every 20 minutes during the day and every 30 minutes while you sleep. Fill out the blood pressure-activity diary during the day, especially during activities that may affect your reading -- such as exercise, stress, walking, taking your blood pressure medications  Important things to know: Avoid taking the monitor off for the next 24 hours, unless it causes you discomfort or pain. Do NOT get the monitor wet and do NOT try to clean the monitor with any cleaning products. Do NOT put the monitor on anyone else's arm. When the cuff inflates, avoid excess movement. Let the cuffed arm hang loosely, slightly away from the body. Avoid flexing the muscles or moving the hand/fingers. Remember to fill out the blood pressure activity diary. If you experience severe pain or unusual pain (not associated with getting your blood pressure checked), remove the monitor.  Troubleshooting:  Code  Troubleshooting   1  Check cuff position, tighten cuff   2, 3  Remain still  during reading   4, 87  Check air hose connections and make sure cuff is tight   85, 89  Check hose connections and make tubing is not crimped   86  Push START/STOP to restart reading   88, 91  Retry by pushing START/STOP   90  Replace batteries. If problem persists, remove monitor and bring back to   clinic at follow up   97, 98, 99  Service required - Remove monitor and bring back to clinic at follow up     .avs

## 2024-05-22 ENCOUNTER — Encounter: Payer: Self-pay | Admitting: Pharmacist

## 2024-05-22 ENCOUNTER — Ambulatory Visit: Admitting: Pharmacist

## 2024-05-22 VITALS — BP 168/97 | HR 70

## 2024-05-22 DIAGNOSIS — I10 Essential (primary) hypertension: Secondary | ICD-10-CM | POA: Diagnosis not present

## 2024-05-22 MED ORDER — SPIRONOLACTONE 25 MG PO TABS: 25.0000 mg | ORAL_TABLET | Freq: Every day | ORAL | 0 refills | Status: DC

## 2024-05-22 NOTE — Assessment & Plan Note (Addendum)
 History of hypertension since 2008 currently taking hydralazine , telmisartan -amlodipine  with goal presssure of <130/80 mm Hg.     Found to have persistently elevated and uncontrolled blood pressure with 24-hour ambulatory blood pressure evaluation which demonstrates an average AWAKE blood pressure of 168/97 mmHg. Strong family history of CVD and high blood pressure.  Nocturnal dipping pattern is normal.   Changes to medications -Started spironolactone 25 mg once daily -Continued hydralazine  25mg  BID and telmisartan -amlodipine  80-10mg  once daily.  -BMET ordered today At next visit, PCP - Dr. Howell, anticipate repeat of BMET and consideration for increasing spironolactone vs hydrochlorothiazide 12.5mg .  If Blood Pressure can be controlled with 3 in 1 Blood Pressure medication plus spironolactone, discontinuation of hydralazine  should be considered.

## 2024-05-22 NOTE — Patient Instructions (Addendum)
 It was nice to see you today!  Thank you for completing the blood pressure monitoring evaluation.  Your goal blood pressure is < 130/80 mmHg   Medication Changes: START spironolactone 25 mg once daily.  Continue monitoring your home blood pressure.   Continue all other medication the same.   Monitor blood pressure at home and keep a log (on a piece of paper) to bring with you to your next visit.

## 2024-05-22 NOTE — Progress Notes (Signed)
 Reviewed and agree with Dr Rennis plan.

## 2024-05-22 NOTE — Progress Notes (Signed)
   S:     Chief Complaint  Patient presents with   Medication Management    Amb Blood Pressure Day 2   58 y.o. female who presents for hypertension evaluation, education, and management.  Patient arrives in good spirits and presents without any assistance. Patient arrived by herself.   Patient returns to clinic with 24 hour blood pressure monitor and reports they were able to wear the ambulatory blood pressure cuff for the entire 24 evaluation period.    O:  Review of Systems  All other systems reviewed and are negative.   Physical Exam Vitals reviewed.  Constitutional:      Appearance: Normal appearance.  Pulmonary:     Effort: Pulmonary effort is normal.  Neurological:     Mental Status: She is alert.  Psychiatric:        Mood and Affect: Mood normal.        Behavior: Behavior normal.        Thought Content: Thought content normal.        Judgment: Judgment normal.     Last 3 Office BP readings: BP Readings from Last 3 Encounters:  05/21/24 (!) 199/118  05/09/24 (!) 181/90  05/09/24 (!) 150/91    Basic Metabolic Panel    Component Value Date/Time   NA 142 12/26/2023 1547   K 4.4 12/26/2023 1547   CL 102 12/26/2023 1547   CO2 25 12/26/2023 1547   GLUCOSE 84 12/26/2023 1547   GLUCOSE 98 04/25/2022 1021   BUN 11 12/26/2023 1547   CREATININE 0.75 12/26/2023 1547   CALCIUM  9.3 12/26/2023 1547   GFRNONAA 109 09/19/2017 0905   GFRAA 125 09/19/2017 0905    ABPM Study Data: Arm Placement left arm  Overall Mean 24hr BP:   154/88 mmHg  HR: 67  Daytime Mean BP:  168/97 mmHg  HR: 70  Nighttime Mean BP:  117/65 mmHg  HR: 59  Dipping Pattern: Yes.    Sys:   30%   Dia: 32.8%  [normal dipping ~10-20%]  For Office Goal BP of <130/80 mmHg:  ABPM thresholds: Overall BP <125/75 mmHg, daytime BP <130/80 mmHg, sleeptime BP <110/65 mmHg    A/P: History of hypertension since 2008 currently taking hydralazine , telmisartan -amlodipine  with goal presssure of <130/80 mm Hg.      Found to have persistently elevated and uncontrolled blood pressure with 24-hour ambulatory blood pressure evaluation which demonstrates an average AWAKE blood pressure of 168/97 mmHg. Strong family history of CVD and high blood pressure.  Nocturnal dipping pattern is normal.   Changes to medications -Started spironolactone 25 mg once daily -Continued hydralazine  25mg  BID and telmisartan -amlodipine  80-10mg  once daily.  -BMET ordered today At next visit, PCP - Dr. Howell, anticipate repeat of BMET and consideration for increasing spironolactone vs hydrochlorothiazide 12.5mg .  If Blood Pressure can be controlled with 3 in 1 Blood Pressure medication plus spironolactone, discontinuation of hydralazine  should be considered.   Results reviewed and written information provided.    Written patient instructions provided. Patient verbalized understanding of treatment plan.  Total time in face to face counseling 22 minutes.    Follow-up:  PCP clinic visit in 11/19  Patient seen with Lawson Mao, PharmD Candidate - PY3 student and [employer]ardo [employer] PharmD - PY4 Candidate.

## 2024-05-23 ENCOUNTER — Encounter: Payer: Self-pay | Admitting: Pharmacist

## 2024-05-23 LAB — BASIC METABOLIC PANEL WITH GFR
BUN/Creatinine Ratio: 17 (ref 9–23)
BUN: 12 mg/dL (ref 6–24)
CO2: 25 mmol/L (ref 20–29)
Calcium: 9.6 mg/dL (ref 8.7–10.2)
Chloride: 103 mmol/L (ref 96–106)
Creatinine, Ser: 0.71 mg/dL (ref 0.57–1.00)
Glucose: 82 mg/dL (ref 70–99)
Potassium: 4.1 mmol/L (ref 3.5–5.2)
Sodium: 143 mmol/L (ref 134–144)
eGFR: 100 mL/min/1.73 (ref >59)

## 2024-06-05 ENCOUNTER — Encounter: Payer: Self-pay | Admitting: Student

## 2024-06-05 ENCOUNTER — Ambulatory Visit (INDEPENDENT_AMBULATORY_CARE_PROVIDER_SITE_OTHER): Admitting: Student

## 2024-06-05 VITALS — BP 180/90 | HR 69 | Ht 64.0 in | Wt 160.0 lb

## 2024-06-05 DIAGNOSIS — I10 Essential (primary) hypertension: Secondary | ICD-10-CM | POA: Diagnosis not present

## 2024-06-05 MED ORDER — OLMESARTAN-AMLODIPINE-HCTZ 40-10-12.5 MG PO TABS: 1.0000 | ORAL_TABLET | Freq: Every day | ORAL | 1 refills | Status: DC

## 2024-06-05 NOTE — Patient Instructions (Signed)
 It was great to see you! Thank you for allowing me to participate in your care!   I recommend that you always bring your medications to each appointment as this makes it easy to ensure we are on the correct medications and helps us  not miss when refills are needed.  Our plans for today:  - Stop taking hydralazine  - Stop taking your telmisartan /amlodipine  pill - Pick up your new prescription for amlodipine -olmesartan-hydrochlorothiazide, take this pill once a day.  I recommend take this medication in the morning. - Continue taking your spironolactone, I also recommend takes medication the morning - Follow-up in 2 weeks - We are checking some labs today, I will call you if they are abnormal will send you a MyChart message or a letter if they are normal.  If you do not hear about your labs in the next 2 weeks please let us  know.  Take care and seek immediate care sooner if you develop any concerns. Please remember to show up 15 minutes before your scheduled appointment time!  Gladis Church, DO [facility]

## 2024-06-05 NOTE — Progress Notes (Signed)
    SUBJECTIVE:   CHIEF COMPLAINT / HPI:   HTN Presents for follow-up of blood pressure.  Recent amatory blood pressure monitoring this month, showed average awake blood pressure of 168/97.  Appropriate nocturnal dipping.  She restarted spironolactone  25 mg once daily, continued on her telmisartan -amlodipine  80-10 mg.  She had been prescribed hydralazine  25 mg twice daily, but has not been taking this medication for several days. She is asymptomatic.   OBJECTIVE:   BP (!) 180/90   Pulse 69   Ht 5' 4 (1.626 m)   Wt 160 lb (72.6 kg)   LMP 02/07/2017 (Approximate)   SpO2 99%   BMI 27.46 kg/m    General: NAD, pleasant Cardio: RRR, no MRG. Respiratory: CTAB, normal wob on RA Skin: Warm and dry  ASSESSMENT/PLAN:   Assessment & Plan Primary hypertension - Poorly controlled on current regimen - Discontinue hydralazine  - Continue spironolactone  25 mg daily - Transition to 3 in 1, amlodipine -olmesartan -hydrochlorothiazide 10-40-12.5 mg daily - Reports anxiety is currently controlled with Lexapro  10 mg, but do suspect anxiety is in part contributing - BMP today to check for electrolytes after starting spironolactone  - Follow-up in 2 weeks - If still uncontrolled, consider referral to cardiology for hypertension clinic  Gladis Crutch, DO [facility]

## 2024-06-05 NOTE — Assessment & Plan Note (Addendum)
-   Poorly controlled on current regimen - Discontinue hydralazine  - Continue spironolactone 25 mg daily - Transition to 3 in 1, amlodipine -olmesartan-hydrochlorothiazide 10-40-12.5 mg daily - Reports anxiety is currently controlled with Lexapro  10 mg, but do suspect anxiety is in part contributing - BMP today to check for electrolytes after starting spironolactone - Follow-up in 2 weeks - If still uncontrolled, consider referral to cardiology for hypertension clinic

## 2024-06-06 ENCOUNTER — Ambulatory Visit: Payer: Self-pay | Admitting: Student

## 2024-06-06 LAB — BASIC METABOLIC PANEL WITH GFR
BUN/Creatinine Ratio: 12 (ref 9–23)
BUN: 9 mg/dL (ref 6–24)
CO2: 23 mmol/L (ref 20–29)
Calcium: 9.3 mg/dL (ref 8.7–10.2)
Chloride: 102 mmol/L (ref 96–106)
Creatinine, Ser: 0.76 mg/dL (ref 0.57–1.00)
Glucose: 79 mg/dL (ref 70–99)
Potassium: 4.1 mmol/L (ref 3.5–5.2)
Sodium: 142 mmol/L (ref 134–144)
eGFR: 92 mL/min/1.73 (ref >59)

## 2024-06-17 ENCOUNTER — Ambulatory Visit: Admitting: Student

## 2024-06-17 VITALS — BP 100/63 | HR 74 | Ht 64.0 in | Wt 160.0 lb

## 2024-06-17 DIAGNOSIS — I10 Essential (primary) hypertension: Secondary | ICD-10-CM

## 2024-06-17 MED ORDER — SPIRONOLACTONE 25 MG PO TABS: 12.5000 mg | ORAL_TABLET | Freq: Every day | ORAL | 0 refills | Status: AC

## 2024-06-17 NOTE — Progress Notes (Signed)
    SUBJECTIVE:   CHIEF COMPLAINT / HPI:   Discussed the use of AI scribe software for clinical note transcription with the patient, who gave verbal consent to proceed.  History of Present Illness Marissa Blankenship is a 58 year old female presenting for follow-up of blood pressure  Poorly controlled blood pressure last visit.  Previous regimen include amlodipine , losartan, hydralazine  and spironolactone .  Hydralazine  was discontinued, and hydrochlorothiazide was started.  Electrolytes at that time and were normal. She reports she has had worsening dizziness since starting the medication.  Dizziness occurs when standing or standing for prolonged period of time, improves after sitting.  Dizziness lasts for minutes or less.  She did notice that 1 time when she was dizzy her blood pressure was lower than normal.  Reports possible increase in palpitations since starting medication.  Of note patient has been worked up for palpitations including a Zio patch which showed intermittent PVCs correlating with symptoms.    PERTINENT  PMH / PSH:  OBJECTIVE:   BP 100/63   Pulse 74   Ht 5' 4 (1.626 m)   Wt 160 lb (72.6 kg)   LMP 02/07/2017 (Approximate)   SpO2 100%   BMI 27.46 kg/m    Physical Exam General: NAD, pleasant Cardio: RRR, no MRG.  Respiratory: CTAB, normal wob on RA Skin: Warm and dry  Orthostatic VS for the past 24 hrs:  BP- Lying Pulse- Lying BP- Sitting Pulse- Sitting BP- Standing at 0 minutes Pulse- Standing at 0 minutes  06/17/24 1404 104/66 69 104/70 70 107/76 76    ASSESSMENT/PLAN:   Assessment & Plan Primary hypertension - Well-controlled today, however dizziness since starting new regimen. Orthostatic vitals negative today. - Reduce spironolactone  from 25 mg to 12.5 mg daily - Continue amlodipine -olmesartan -hydrochlorothiazide 10-40-12.5 mg daily - Check electrolytes today - Follow-up in 1 to 2 weeks to recheck symptomology   Gladis Church, DO [facility]  Medicine Center

## 2024-06-17 NOTE — Assessment & Plan Note (Signed)
-   Well-controlled today, however dizziness since starting new regimen. Orthostatic vitals negative today. - Reduce spironolactone  from 25 mg to 12.5 mg daily - Continue amlodipine -olmesartan -hydrochlorothiazide 10-40-12.5 mg daily - Check electrolytes today - Follow-up in 1 to 2 weeks to recheck symptomology

## 2024-06-17 NOTE — Patient Instructions (Addendum)
 It was great to see you! Thank you for allowing me to participate in your care!   I recommend that you always bring your medications to each appointment as this makes it easy to ensure we are on the correct medications and helps us  not miss when refills are needed.  Our plans for today:  - Reduce your spironolactone  to 12.5 mg (0.5 tablet) from 25 mg (1 tablet) - Follow-up in 1-2 weeks to make sure dizziness is improving  Take care and seek immediate care sooner if you develop any concerns. Please remember to show up 15 minutes before your scheduled appointment time!  Gladis Church, DO [facility]

## 2024-06-18 ENCOUNTER — Ambulatory Visit: Payer: Self-pay | Admitting: Student

## 2024-06-18 LAB — BASIC METABOLIC PANEL WITH GFR
BUN/Creatinine Ratio: 22 (ref 9–23)
BUN: 21 mg/dL (ref 6–24)
CO2: 25 mmol/L (ref 20–29)
Calcium: 9.7 mg/dL (ref 8.7–10.2)
Chloride: 99 mmol/L (ref 96–106)
Creatinine, Ser: 0.97 mg/dL (ref 0.57–1.00)
Glucose: 83 mg/dL (ref 70–99)
Potassium: 4 mmol/L (ref 3.5–5.2)
Sodium: 138 mmol/L (ref 134–144)
eGFR: 69 mL/min/1.73 (ref >59)

## 2024-06-24 ENCOUNTER — Other Ambulatory Visit: Payer: Self-pay

## 2024-06-26 ENCOUNTER — Other Ambulatory Visit: Payer: Self-pay

## 2024-06-28 ENCOUNTER — Other Ambulatory Visit: Payer: Self-pay

## 2024-06-28 ENCOUNTER — Other Ambulatory Visit: Payer: Self-pay | Admitting: Student

## 2024-06-28 DIAGNOSIS — I10 Essential (primary) hypertension: Secondary | ICD-10-CM

## 2024-06-28 NOTE — Progress Notes (Signed)
 Specialty Pharmacy Refill Coordination Note  Marissa Blankenship is a 58 y.o. female contacted today regarding refills of specialty medication(s) Risankizumab -rzaa (Skyrizi )   Patient requested Delivery   Delivery date: 07/05/24   Verified address: [address] [place] [address]   Medication will be filled on: 07/04/24

## 2024-07-01 ENCOUNTER — Other Ambulatory Visit: Payer: Self-pay

## 2024-07-01 NOTE — Progress Notes (Deleted)
 Clinical Intervention Note  Clinical Intervention Notes: Patient reported starting B12 supplement. No DDIs identified with Dupixent .   Clinical Intervention Outcomes: Prevention of an adverse drug event   Advertising Account Planner

## 2024-07-01 NOTE — Progress Notes (Signed)
 Clinical Intervention Note  Clinical Intervention Notes: Patient reported starting lexapro , aldactone , and olmesartan /amlodipine / hctz. No DDIs identified with Skyrizi .   Clinical Intervention Outcomes: Prevention of an adverse drug event   Advertising Account Planner

## 2024-07-04 ENCOUNTER — Other Ambulatory Visit: Payer: Self-pay

## 2024-07-23 ENCOUNTER — Other Ambulatory Visit (HOSPITAL_COMMUNITY): Payer: Self-pay

## 2024-07-23 ENCOUNTER — Telehealth: Payer: Self-pay

## 2024-07-23 NOTE — Telephone Encounter (Signed)
 Pharmacy Patient Advocate Encounter   Received notification from CoverMyMeds that prior authorization for Skyrizi  360MG /2.4ML (150MG /ML) single-dose prefilled cartridge with on-body injector is required/requested.   Insurance verification completed.   The patient is insured through [facility].   Per test claim: PA required; PA submitted to above mentioned insurance via Latent Key/confirmation #/EOC [id number] Status is pending

## 2024-07-25 ENCOUNTER — Other Ambulatory Visit (HOSPITAL_COMMUNITY): Payer: Self-pay

## 2024-07-25 NOTE — Telephone Encounter (Signed)
 Pharmacy Patient Advocate Encounter  Received notification from [facility] Medicare that Prior Authorization for Skyrizi  360MG /2.4ML (150MG /ML) single-dose prefilled cartridge with on-body injector has been APPROVED from 07-25-2024 to 07-17-2025   PA #/Case ID/Reference #: [id number]

## 2024-08-04 ENCOUNTER — Other Ambulatory Visit: Payer: Self-pay | Admitting: Gastroenterology

## 2024-08-12 ENCOUNTER — Other Ambulatory Visit: Payer: Self-pay

## 2024-08-12 ENCOUNTER — Other Ambulatory Visit: Payer: Self-pay | Admitting: Student

## 2024-08-12 DIAGNOSIS — F411 Generalized anxiety disorder: Secondary | ICD-10-CM

## 2024-08-12 NOTE — Progress Notes (Signed)
 Specialty Pharmacy Ongoing Clinical Assessment Note  Marissa Blankenship is a 59 y.o. female who is being followed by the specialty pharmacy service for RxSp Crohn's Disease   Patient's specialty medication(s) reviewed today: Risankizumab -rzaa (Skyrizi )   Missed doses in the last 4 weeks: 0   Patient/Caregiver did not have any additional questions or concerns.   Therapeutic benefit summary: Patient is achieving benefit   Adverse events/side effects summary: No adverse events/side effects   Patient's therapy is appropriate to: Continue    Goals Addressed             This Visit's Progress    Stabilization of disease   On track    Patient is on track. Patient will maintain adherence         Follow up: 12 months  [name] Specialty Pharmacist

## 2024-08-22 ENCOUNTER — Other Ambulatory Visit: Payer: Self-pay

## 2024-09-02 ENCOUNTER — Ambulatory Visit: Payer: Self-pay | Admitting: Student

## 2024-11-04 ENCOUNTER — Encounter
